# Patient Record
Sex: Female | Born: 1945 | Race: White | Hispanic: No | Smoking: Never smoker
Health system: Southern US, Community
[De-identification: ages and names within clinical notes are randomized; demographics above are authoritative.]

## PROBLEM LIST (undated history)

## (undated) ENCOUNTER — Emergency Department (HOSPITAL_COMMUNITY): Payer: Medicare Other | Source: Home / Self Care

## (undated) DIAGNOSIS — R51 Headache: Secondary | ICD-10-CM

## (undated) DIAGNOSIS — E0590 Thyrotoxicosis, unspecified without thyrotoxic crisis or storm: Secondary | ICD-10-CM

## (undated) DIAGNOSIS — K219 Gastro-esophageal reflux disease without esophagitis: Secondary | ICD-10-CM

## (undated) DIAGNOSIS — M1990 Unspecified osteoarthritis, unspecified site: Secondary | ICD-10-CM

## (undated) DIAGNOSIS — E210 Primary hyperparathyroidism: Secondary | ICD-10-CM

## (undated) DIAGNOSIS — D649 Anemia, unspecified: Secondary | ICD-10-CM

## (undated) DIAGNOSIS — E559 Vitamin D deficiency, unspecified: Secondary | ICD-10-CM

## (undated) DIAGNOSIS — I8390 Asymptomatic varicose veins of unspecified lower extremity: Secondary | ICD-10-CM

## (undated) DIAGNOSIS — E042 Nontoxic multinodular goiter: Secondary | ICD-10-CM

## (undated) DIAGNOSIS — H409 Unspecified glaucoma: Secondary | ICD-10-CM

## (undated) DIAGNOSIS — F411 Generalized anxiety disorder: Secondary | ICD-10-CM

## (undated) DIAGNOSIS — N189 Chronic kidney disease, unspecified: Secondary | ICD-10-CM

## (undated) DIAGNOSIS — E782 Mixed hyperlipidemia: Secondary | ICD-10-CM

## (undated) DIAGNOSIS — IMO0002 Reserved for concepts with insufficient information to code with codable children: Secondary | ICD-10-CM

## (undated) DIAGNOSIS — M329 Systemic lupus erythematosus, unspecified: Secondary | ICD-10-CM

## (undated) DIAGNOSIS — Z8719 Personal history of other diseases of the digestive system: Secondary | ICD-10-CM

## (undated) DIAGNOSIS — G43909 Migraine, unspecified, not intractable, without status migrainosus: Secondary | ICD-10-CM

## (undated) DIAGNOSIS — J189 Pneumonia, unspecified organism: Secondary | ICD-10-CM

## (undated) DIAGNOSIS — M8580 Other specified disorders of bone density and structure, unspecified site: Secondary | ICD-10-CM

## (undated) DIAGNOSIS — R519 Headache, unspecified: Secondary | ICD-10-CM

## (undated) HISTORY — DX: Mixed hyperlipidemia: E78.2

## (undated) HISTORY — PX: APPENDECTOMY: SHX54

## (undated) HISTORY — PX: ABDOMINAL HYSTERECTOMY: SHX81

## (undated) HISTORY — DX: Primary hyperparathyroidism: E21.0

## (undated) HISTORY — DX: Thyrotoxicosis, unspecified without thyrotoxic crisis or storm: E05.90

## (undated) HISTORY — DX: Nontoxic multinodular goiter: E04.2

## (undated) HISTORY — DX: Other specified disorders of bone density and structure, unspecified site: M85.80

## (undated) HISTORY — DX: Migraine, unspecified, not intractable, without status migrainosus: G43.909

## (undated) HISTORY — PX: SHOULDER SURGERY: SHX246

## (undated) HISTORY — PX: JOINT REPLACEMENT: SHX530

## (undated) HISTORY — DX: Generalized anxiety disorder: F41.1

## (undated) HISTORY — PX: OTHER SURGICAL HISTORY: SHX169

## (undated) HISTORY — DX: Unspecified glaucoma: H40.9

## (undated) HISTORY — DX: Asymptomatic varicose veins of unspecified lower extremity: I83.90

## (undated) HISTORY — DX: Vitamin D deficiency, unspecified: E55.9

## (undated) HISTORY — PX: EYE SURGERY: SHX253

## (undated) HISTORY — DX: Anemia, unspecified: D64.9

---

## 2003-06-27 ENCOUNTER — Ambulatory Visit (HOSPITAL_COMMUNITY): Admission: RE | Admit: 2003-06-27 | Discharge: 2003-06-27 | Payer: Self-pay | Admitting: Gastroenterology

## 2004-07-18 ENCOUNTER — Ambulatory Visit (HOSPITAL_COMMUNITY): Admission: RE | Admit: 2004-07-18 | Discharge: 2004-07-18 | Payer: Self-pay | Admitting: Gastroenterology

## 2004-07-18 ENCOUNTER — Encounter (INDEPENDENT_AMBULATORY_CARE_PROVIDER_SITE_OTHER): Payer: Self-pay | Admitting: *Deleted

## 2005-10-12 ENCOUNTER — Emergency Department (HOSPITAL_COMMUNITY): Admission: EM | Admit: 2005-10-12 | Discharge: 2005-10-12 | Payer: Self-pay | Admitting: Emergency Medicine

## 2007-04-24 ENCOUNTER — Encounter (INDEPENDENT_AMBULATORY_CARE_PROVIDER_SITE_OTHER): Payer: Self-pay | Admitting: Family Medicine

## 2007-04-24 ENCOUNTER — Ambulatory Visit: Payer: Self-pay | Admitting: Surgery

## 2007-04-24 ENCOUNTER — Ambulatory Visit (HOSPITAL_COMMUNITY): Admission: RE | Admit: 2007-04-24 | Discharge: 2007-04-24 | Payer: Self-pay | Admitting: Family Medicine

## 2008-05-12 ENCOUNTER — Encounter: Admission: RE | Admit: 2008-05-12 | Discharge: 2008-05-12 | Payer: Self-pay | Admitting: Family Medicine

## 2009-05-22 ENCOUNTER — Other Ambulatory Visit: Admission: RE | Admit: 2009-05-22 | Discharge: 2009-05-22 | Payer: Self-pay | Admitting: Family Medicine

## 2009-05-23 ENCOUNTER — Encounter: Admission: RE | Admit: 2009-05-23 | Discharge: 2009-05-23 | Payer: Self-pay | Admitting: Family Medicine

## 2010-02-03 ENCOUNTER — Emergency Department (HOSPITAL_COMMUNITY): Admission: EM | Admit: 2010-02-03 | Discharge: 2010-02-04 | Payer: Self-pay | Admitting: Emergency Medicine

## 2010-07-01 ENCOUNTER — Encounter: Payer: Self-pay | Admitting: Orthopedic Surgery

## 2010-07-01 ENCOUNTER — Encounter: Payer: Self-pay | Admitting: *Deleted

## 2010-09-24 ENCOUNTER — Other Ambulatory Visit: Payer: Self-pay | Admitting: Gastroenterology

## 2010-10-26 NOTE — Op Note (Signed)
NAME:  Lynn Jenkins, Lynn Jenkins                ACCOUNT NO.:  [id number]   MEDICAL RECORD NO.:  [id number]                   PATIENT TYPE:  AMB   LOCATION:  ENDO                                 FACILITY:  [facility]   PHYSICIAN:  Graylin Shiver, M.D.                DATE OF BIRTH:  Nov 21, 1945   DATE OF PROCEDURE:  06/27/2003  DATE OF DISCHARGE:                                 OPERATIVE REPORT   PROCEDURE:  Colonoscopy.   INDICATIONS:  Screening.  Informed consent was obtained after explanation of  the risks of bleeding, infection, and perforation.   PREMEDICATION:  Fentanyl 75 mcg IV, Versed 7 mg IV.   DESCRIPTION OF PROCEDURE:  With the patient in the left lateral decubitus  position, a rectal exam was performed.  No masses were felt.  The Olympus  colonoscope was inserted into the rectum and advanced around the colon to  the cecum.  Cecal landmarks were identified.  The cecum and ascending colon  were normal.  The transverse colon normal, the descending colon, sigmoid,  and rectum were normal.  She tolerated the procedure well without  complications.   IMPRESSION:  Normal colonoscopy to the cecum.                                               Graylin Shiver, M.D.    SFG/MEDQ  D:  06/27/2003  T:  06/28/2003  Job:  [id number]   cc:   Al Decant. Janey Greaser, MD  [address]  [place]  [place] [address]  Fax: [phone]

## 2010-10-26 NOTE — Op Note (Signed)
NAME:  Lynn Jenkins, Lynn Jenkins    ACCOUNT NO.:  [id number]   MEDICAL RECORD NO.:  [id number]          PATIENT TYPE:  AMB   LOCATION:  ENDO                         FACILITY:  [facility]   PHYSICIAN:  Graylin Shiver, M.D.   DATE OF BIRTH:  06/29/1945   DATE OF PROCEDURE:  07/18/2004  DATE OF DISCHARGE:                                 OPERATIVE REPORT   PROCEDURE PERFORMED:  Esophagogastroduodenoscopy with biopsy.   INDICATIONS FOR PROCEDURE:  Chronic heartburn.   Informed consent was obtained after explanation of the risks of bleeding,  infection and perforation.   PREMEDICATION:  Fentanyl 40 mcg IV, Versed 6 milligrams IV.   PROCEDURE:  With the patient in the left lateral decubitus position, the  Olympus gastroscope was inserted into the oropharynx and passed into the  esophagus. It was advanced down the esophagus and into the stomach and into  the duodenum.  The second portion and bulb of the duodenum looked normal.  The stomach showed erythema compatible with gastritis in the antrum and  lower body.  Biopsies were obtained for histological inspection.  The body  of the stomach and the upper fundus and cardia had normal-appearing mucosa.  No lesions were seen on retroflexion.  There was a moderate-sized hiatal  hernia. The esophageal mucosa looked normal. The esophagogastric junction  was at 30 cm.  She tolerated the procedure well without complications.   IMPRESSION:  1.  Moderate sized hiatal hernia.  2.  Gastritis.   PLAN:  Continue Nexium.  It may be necessary to use a b.i.d. dose of Nexium  to control her heartburn.  The biopsies will be checked.      SFG/MEDQ  D:  07/18/2004  T:  07/18/2004  Job:  [id number]   cc:   Al Decant. Janey Greaser, MD  [address]  [place]  [place] [address]  Fax: [phone]

## 2011-02-26 ENCOUNTER — Other Ambulatory Visit: Payer: Self-pay | Admitting: Orthopedic Surgery

## 2011-02-26 DIAGNOSIS — N289 Disorder of kidney and ureter, unspecified: Secondary | ICD-10-CM

## 2011-02-27 ENCOUNTER — Ambulatory Visit
Admission: RE | Admit: 2011-02-27 | Discharge: 2011-02-27 | Disposition: A | Discharge status: Home / Self Care | Payer: Managed Care, Other (non HMO) | Source: Ambulatory Visit | Attending: Orthopedic Surgery | Admitting: Orthopedic Surgery

## 2011-02-27 DIAGNOSIS — N289 Disorder of kidney and ureter, unspecified: Secondary | ICD-10-CM

## 2011-09-05 ENCOUNTER — Other Ambulatory Visit: Payer: Self-pay

## 2011-09-05 DIAGNOSIS — I83893 Varicose veins of bilateral lower extremities with other complications: Secondary | ICD-10-CM

## 2011-11-11 ENCOUNTER — Encounter: Payer: Self-pay | Admitting: Vascular Surgery

## 2011-11-12 ENCOUNTER — Encounter (INDEPENDENT_AMBULATORY_CARE_PROVIDER_SITE_OTHER): Payer: Managed Care, Other (non HMO) | Admitting: *Deleted

## 2011-11-12 ENCOUNTER — Ambulatory Visit (INDEPENDENT_AMBULATORY_CARE_PROVIDER_SITE_OTHER): Payer: Managed Care, Other (non HMO) | Admitting: Vascular Surgery

## 2011-11-12 ENCOUNTER — Encounter: Payer: Self-pay | Admitting: Vascular Surgery

## 2011-11-12 VITALS — BP 140/92 | HR 83 | Resp 16 | Ht 66.0 in | Wt 212.0 lb

## 2011-11-12 DIAGNOSIS — I83893 Varicose veins of bilateral lower extremities with other complications: Secondary | ICD-10-CM

## 2011-11-12 NOTE — Progress Notes (Signed)
Subjective:     Patient ID: Lynn Jenkins, female   DOB: 1946-02-14, 66 y.o.   MRN: [id number]  HPI this 66 year old female was referred for pain in both lower extremities right worse than left due to venous insufficiency. She has noted some bulges on the lateral aspect of her right thigh and she complains of significant aching throbbing and burning discomfort extending up into the anterior thigh on that side. She has mild distal edema as the day progresses. She has no history of DVT, thrombophlebitis, stasis ulcers, or bleeding. She does not wear elastic compression stockings. Elevation of her legs does not relieve her symptoms. She does not take pain medication. She states the symptoms can be quite severe.  Past Medical History  Diagnosis Date  . Varicose veins   . Anemia     History  Substance Use Topics  . Smoking status: Never Smoker   . Smokeless tobacco: Not on file  . Alcohol Use: No    Family History  Problem Relation Age of Onset  . Heart disease Mother   . Hyperlipidemia Mother   . Hypertension Mother   . Heart disease Father   . Hyperlipidemia Father   . Hypertension Father   . Cancer Brother   . Hypertension Brother     Allergies  Allergen Reactions  . Morphine And Related     Current outpatient prescriptions:esomeprazole (NEXIUM) 40 MG capsule, Take 40 mg by mouth daily before breakfast., Disp: , Rfl: ;  fenofibrate (TRIGLIDE) 50 MG tablet, Take 50 mg by mouth daily., Disp: , Rfl: ;  indomethacin (INDOCIN) 25 MG capsule, Take 25 mg by mouth 2 (two) times daily with a meal., Disp: , Rfl: ;  solifenacin (VESICARE) 10 MG tablet, Take 5 mg by mouth daily., Disp: , Rfl:  SUMAtriptan (IMITREX) 100 MG tablet, Take 100 mg by mouth every 2 (two) hours as needed., Disp: , Rfl:   BP 140/92  Pulse 83  Resp 16  Ht 5\' 6"  (1.676 m)  Wt 212 lb (96.163 kg)  BMI 34.22 kg/m2  Body mass index is 34.22 kg/(m^2).           Review of Systems denies chest pain, dyspnea on  exertion, PND, orthopnea, hemoptysis, chronic bronchitis, lateralizing neurologic symptoms.    Objective:   Physical Exam blood pressure 140/92 heart rate 83 respirations 16 Gen.-alert and oriented x3 in no apparent distress HEENT normal for age Lungs no rhonchi or wheezing Cardiovascular regular rhythm no murmurs carotid pulses 3+ palpable no bruits audible Abdomen soft nontender no palpable masses-obese  'Musculoskeletal free of  major deformities Skin clear -no rashes Neurologic normal Lower extremities 3+ femoral and dorsalis pedis pulses palpable bilaterally with no edema-she has prominent varicosities in the lateral aspect of the proximal calf and lateral thigh where the discomfort is located. She has no significant varicosities in the left leg. She does have some spider veins bilaterally in the medial thigh areas and lateral thigh areas.  Today I ordered bilateral venous duplex exam which I reviewed and interpreted. She does have some mild reflux in both great saphenous and small saphenous systems but the caliber of the veins are small bilaterally. There is no DVT. There is very mild reflux in the deep system.     Assessment:     Mild reflux bilateral great and small saphenous vein with painful varicosities lateral calf and thigh on the right    Plan:     Have offered sclerotherapy as treatment for this  prominent varix on the lateral aspect of the right thigh and calf. It is not certain that this will relieve her symptomatology since she also has lumbar disc disease. She will consider that as an option

## 2011-11-18 NOTE — Procedures (Unsigned)
LOWER EXTREMITY VENOUS REFLUX EXAM  INDICATION:  Varicose veins with discomfort  EXAM:  Using color-flow imaging and pulse Doppler spectral analysis, the right and left common femoral, femoral, popliteal, posterior tibial, great and small saphenous veins were evaluated.  There is evidence suggesting deep venous insufficiency in the right and left lower extremity.  The right and left saphenofemoral junction is not competent with reflux of >500 milliseconds.  The right and left great saphenous vein is not competent with reflux of >500 milliseconds with the caliber as described below.   The right and left proximal small saphenous vein demonstrates incompetency.  GSV Diameter (used if found to be incompetent only)                                           Right    Left Proximal Greater Saphenous Vein           0.96 cm  0.92 cm Proximal-to-mid-thigh                     0.49 cm  0.52 cm Mid thigh                                 0.39 cm  0.53 cm Mid-distal thigh                          cm       cm Distal thigh                              0.27 cm  0.48 cm Knee                                      0.38 cm  0.33 cm   IMPRESSION: 1. The right and left great saphenous vein is not competent with     reflux of >500 milliseconds. 2. The right and left great saphenous vein is not tortuous. 3. The deep venous system is not competent with reflux of >500     milliseconds. 4. The right and left small saphenous vein is not competent with     reflux of >500 milliseconds.       ___________________________________________ Quita Skye Hart [place], M.D.  EM/MEDQ  D:  11/12/2011  T:  11/12/2011  Job:  [id number]

## 2013-10-07 ENCOUNTER — Other Ambulatory Visit: Payer: Self-pay | Admitting: Family Medicine

## 2013-10-07 DIAGNOSIS — Z1231 Encounter for screening mammogram for malignant neoplasm of breast: Secondary | ICD-10-CM

## 2013-10-18 ENCOUNTER — Ambulatory Visit: Payer: Managed Care, Other (non HMO)

## 2013-10-26 ENCOUNTER — Other Ambulatory Visit: Payer: Self-pay | Admitting: Gastroenterology

## 2013-10-26 DIAGNOSIS — K219 Gastro-esophageal reflux disease without esophagitis: Secondary | ICD-10-CM

## 2013-11-04 ENCOUNTER — Ambulatory Visit
Admission: RE | Admit: 2013-11-04 | Discharge: 2013-11-04 | Disposition: A | Discharge status: Home / Self Care | Payer: BC Managed Care – PPO | Source: Ambulatory Visit | Attending: Gastroenterology | Admitting: Gastroenterology

## 2013-11-04 DIAGNOSIS — K219 Gastro-esophageal reflux disease without esophagitis: Secondary | ICD-10-CM

## 2013-12-17 ENCOUNTER — Ambulatory Visit (INDEPENDENT_AMBULATORY_CARE_PROVIDER_SITE_OTHER): Payer: BC Managed Care – PPO | Admitting: Surgery

## 2014-01-05 ENCOUNTER — Other Ambulatory Visit (INDEPENDENT_AMBULATORY_CARE_PROVIDER_SITE_OTHER): Payer: Self-pay

## 2014-01-05 ENCOUNTER — Encounter (INDEPENDENT_AMBULATORY_CARE_PROVIDER_SITE_OTHER): Payer: Self-pay | Admitting: Surgery

## 2014-01-05 ENCOUNTER — Ambulatory Visit (INDEPENDENT_AMBULATORY_CARE_PROVIDER_SITE_OTHER): Payer: BC Managed Care – PPO | Admitting: Surgery

## 2014-01-05 VITALS — BP 126/76 | HR 77 | Temp 98.0°F | Ht 65.0 in | Wt 225.0 lb

## 2014-01-14 ENCOUNTER — Other Ambulatory Visit (HOSPITAL_COMMUNITY): Payer: Self-pay | Admitting: Gastroenterology

## 2014-01-14 DIAGNOSIS — R110 Nausea: Secondary | ICD-10-CM

## 2014-01-21 ENCOUNTER — Ambulatory Visit (HOSPITAL_COMMUNITY): Payer: BC Managed Care – PPO

## 2014-01-27 ENCOUNTER — Encounter (HOSPITAL_COMMUNITY)
Admission: RE | Admit: 2014-01-27 | Discharge: 2014-01-27 | Disposition: A | Discharge status: Home / Self Care | Payer: BC Managed Care – PPO | Source: Ambulatory Visit | Attending: Gastroenterology | Admitting: Gastroenterology

## 2014-01-27 ENCOUNTER — Ambulatory Visit (INDEPENDENT_AMBULATORY_CARE_PROVIDER_SITE_OTHER): Payer: BC Managed Care – PPO | Admitting: Surgery

## 2014-01-27 DIAGNOSIS — R110 Nausea: Secondary | ICD-10-CM | POA: Diagnosis not present

## 2014-01-27 MED ORDER — TECHNETIUM TC 99M SULFUR COLLOID
2.0000 | Freq: Once | INTRAVENOUS | Status: AC | PRN
  Administered 2014-01-27: 2 via ORAL

## 2014-02-18 ENCOUNTER — Other Ambulatory Visit: Payer: Self-pay | Admitting: Gastroenterology

## 2014-02-18 DIAGNOSIS — R1013 Epigastric pain: Secondary | ICD-10-CM

## 2014-02-25 ENCOUNTER — Other Ambulatory Visit: Payer: BC Managed Care – PPO

## 2014-02-28 ENCOUNTER — Ambulatory Visit (HOSPITAL_BASED_OUTPATIENT_CLINIC_OR_DEPARTMENT_OTHER)
Admission: RE | Admit: 2014-02-28 | Discharge: 2014-02-28 | Disposition: A | Discharge status: Home / Self Care | Payer: BC Managed Care – PPO | Source: Ambulatory Visit | Attending: Family Medicine | Admitting: Family Medicine

## 2014-02-28 ENCOUNTER — Ambulatory Visit (HOSPITAL_BASED_OUTPATIENT_CLINIC_OR_DEPARTMENT_OTHER)
Admission: RE | Admit: 2014-02-28 | Discharge: 2014-02-28 | Disposition: A | Discharge status: Home / Self Care | Payer: BC Managed Care – PPO | Source: Ambulatory Visit | Attending: Gastroenterology | Admitting: Gastroenterology

## 2014-02-28 DIAGNOSIS — R1011 Right upper quadrant pain: Secondary | ICD-10-CM | POA: Diagnosis not present

## 2014-02-28 DIAGNOSIS — R1013 Epigastric pain: Secondary | ICD-10-CM

## 2014-02-28 DIAGNOSIS — K769 Liver disease, unspecified: Secondary | ICD-10-CM | POA: Insufficient documentation

## 2014-02-28 DIAGNOSIS — Z1231 Encounter for screening mammogram for malignant neoplasm of breast: Secondary | ICD-10-CM | POA: Diagnosis present

## 2014-07-12 ENCOUNTER — Other Ambulatory Visit (HOSPITAL_COMMUNITY): Payer: Self-pay | Admitting: Endocrinology

## 2014-07-25 ENCOUNTER — Encounter (HOSPITAL_COMMUNITY)
Admission: RE | Admit: 2014-07-25 | Discharge: 2014-07-25 | Disposition: A | Discharge status: Home / Self Care | Payer: BLUE CROSS/BLUE SHIELD | Source: Ambulatory Visit | Attending: Endocrinology | Admitting: Endocrinology

## 2014-07-25 ENCOUNTER — Encounter (HOSPITAL_COMMUNITY): Admission: RE | Admit: 2014-07-25 | Payer: BLUE CROSS/BLUE SHIELD | Source: Ambulatory Visit

## 2014-07-25 MED ORDER — TECHNETIUM TC 99M SESTAMIBI - CARDIOLITE
24.0000 | Freq: Once | INTRAVENOUS | Status: AC | PRN
  Administered 2014-07-25: 24 via INTRAVENOUS

## 2014-08-17 ENCOUNTER — Other Ambulatory Visit (INDEPENDENT_AMBULATORY_CARE_PROVIDER_SITE_OTHER): Payer: Self-pay

## 2014-08-17 DIAGNOSIS — E214 Other specified disorders of parathyroid gland: Secondary | ICD-10-CM

## 2014-08-17 DIAGNOSIS — E210 Primary hyperparathyroidism: Secondary | ICD-10-CM

## 2014-08-19 ENCOUNTER — Encounter (INDEPENDENT_AMBULATORY_CARE_PROVIDER_SITE_OTHER): Payer: Self-pay

## 2014-08-19 ENCOUNTER — Ambulatory Visit
Admission: RE | Admit: 2014-08-19 | Discharge: 2014-08-19 | Disposition: A | Discharge status: Home / Self Care | Payer: BLUE CROSS/BLUE SHIELD | Source: Ambulatory Visit | Attending: Surgery | Admitting: Surgery

## 2014-08-30 ENCOUNTER — Other Ambulatory Visit: Payer: Self-pay | Admitting: Surgery

## 2014-08-30 DIAGNOSIS — E041 Nontoxic single thyroid nodule: Secondary | ICD-10-CM

## 2014-08-31 ENCOUNTER — Other Ambulatory Visit: Payer: Self-pay | Admitting: Surgery

## 2014-08-31 DIAGNOSIS — E041 Nontoxic single thyroid nodule: Secondary | ICD-10-CM

## 2014-09-14 ENCOUNTER — Other Ambulatory Visit (HOSPITAL_COMMUNITY)
Admission: RE | Admit: 2014-09-14 | Discharge: 2014-09-14 | Disposition: A | Discharge status: Home / Self Care | Payer: BLUE CROSS/BLUE SHIELD | Source: Ambulatory Visit | Attending: Interventional Radiology | Admitting: Interventional Radiology

## 2014-09-14 ENCOUNTER — Ambulatory Visit
Admission: RE | Admit: 2014-09-14 | Discharge: 2014-09-14 | Disposition: A | Discharge status: Home / Self Care | Payer: BLUE CROSS/BLUE SHIELD | Source: Ambulatory Visit | Attending: Surgery | Admitting: Surgery

## 2014-09-14 DIAGNOSIS — E041 Nontoxic single thyroid nodule: Secondary | ICD-10-CM | POA: Diagnosis not present

## 2014-10-19 ENCOUNTER — Ambulatory Visit: Payer: Self-pay | Admitting: Surgery

## 2014-10-27 NOTE — Patient Instructions (Signed)
Lynn Jenkins  10/27/2014   Your procedure is scheduled on: 11-01-14  Report to [facility]  Entrance and follow signs to               [place] at 7:30 AM.  Call this number if you have problems the morning of surgery [phone]   Remember: ONLY 1 PERSON MAY GO WITH YOU TO SHORT STAY TO GET  READY MORNING OF [place].  Do not eat food or drink liquids :After Midnight.     Take these medicines the morning of surgery with A SIP OF WATER: Nexium                               You may not have any metal on your body including hair pins and              piercings  Do not wear jewelry, make-up, lotions, powders or perfumes, deodorant             Do not wear nail polish.  Do not shave  48 hours prior to surgery.         .   Do not bring valuables to the hospital. [place].  Contacts, dentures or bridgework may not be worn into surgery.  Leave suitcase in the car. After surgery it may be brought to your room.    Marland Kitchen    Special Instructions: coughing and deep exercises, leg exercises              Please read over the following fact sheets you were given: _____________________________________________________________________             [facility] - Preparing for Surgery Before surgery, you can play an important role.  Because skin is not sterile, your skin needs to be as free of germs as possible.  You can reduce the number of germs on your skin by washing with CHG (chlorahexidine gluconate) soap before surgery.  CHG is an antiseptic cleaner which kills germs and bonds with the skin to continue killing germs even after washing. Please DO NOT use if you have an allergy to CHG or antibacterial soaps.  If your skin becomes reddened/irritated stop using the CHG and inform your nurse when you arrive at Short Stay. Do not shave (including legs and underarms) for at least 48 hours prior to the first CHG  shower.  You may shave your face/neck. Please follow these instructions carefully:  1.  Shower with CHG Soap the night before surgery and the  morning of Surgery.  2.  If you choose to wash your hair, wash your hair first as usual with your  normal  shampoo.  3.  After you shampoo, rinse your hair and body thoroughly to remove the  shampoo.                           4.  Use CHG as you would any other liquid soap.  You can apply chg directly  to the skin and wash                       Gently with a scrungie or clean washcloth.  5.  Apply the CHG Soap to your body ONLY FROM THE NECK DOWN.   Do not use on face/ open                           Wound or open sores. Avoid contact with eyes, ears mouth and genitals (private parts).                       Wash face,  Genitals (private parts) with your normal soap.             6.  Wash thoroughly, paying special attention to the area where your surgery  will be performed.  7.  Thoroughly rinse your body with warm water from the neck down.  8.  DO NOT shower/wash with your normal soap after using and rinsing off  the CHG Soap.                9.  Pat yourself dry with a clean towel.            10.  Wear clean pajamas.            11.  Place clean sheets on your bed the night of your first shower and do not  sleep with pets. Day of Surgery : Do not apply any lotions/deodorants the morning of surgery.  Please wear clean clothes to the hospital/surgery center.  FAILURE TO FOLLOW THESE INSTRUCTIONS MAY RESULT IN THE CANCELLATION OF YOUR SURGERY PATIENT SIGNATURE_________________________________  NURSE SIGNATURE__________________________________  ________________________________________________________________________

## 2014-10-28 ENCOUNTER — Encounter (HOSPITAL_COMMUNITY)
Admission: RE | Admit: 2014-10-28 | Discharge: 2014-10-28 | Disposition: A | Discharge status: Home / Self Care | Payer: BLUE CROSS/BLUE SHIELD | Source: Ambulatory Visit | Attending: Surgery | Admitting: Surgery

## 2014-10-28 ENCOUNTER — Encounter (HOSPITAL_COMMUNITY): Payer: Self-pay

## 2014-10-28 ENCOUNTER — Ambulatory Visit (HOSPITAL_COMMUNITY)
Admission: RE | Admit: 2014-10-28 | Discharge: 2014-10-28 | Disposition: A | Discharge status: Home / Self Care | Payer: BLUE CROSS/BLUE SHIELD | Source: Ambulatory Visit | Attending: Anesthesiology | Admitting: Anesthesiology

## 2014-10-28 DIAGNOSIS — Z01812 Encounter for preprocedural laboratory examination: Secondary | ICD-10-CM | POA: Diagnosis not present

## 2014-10-28 DIAGNOSIS — Z01818 Encounter for other preprocedural examination: Secondary | ICD-10-CM | POA: Insufficient documentation

## 2014-10-28 DIAGNOSIS — E210 Primary hyperparathyroidism: Secondary | ICD-10-CM | POA: Diagnosis not present

## 2014-10-28 HISTORY — DX: Headache, unspecified: R51.9

## 2014-10-28 HISTORY — DX: Headache: R51

## 2014-10-28 HISTORY — DX: Gastro-esophageal reflux disease without esophagitis: K21.9

## 2014-10-28 HISTORY — DX: Personal history of other diseases of the digestive system: Z87.19

## 2014-10-28 HISTORY — DX: Unspecified osteoarthritis, unspecified site: M19.90

## 2014-10-28 LAB — CBC
HCT: 40.2 % (ref 36.0–46.0)
Hemoglobin: 12.5 g/dL (ref 12.0–15.0)
MCH: 25.9 pg — ABNORMAL LOW (ref 26.0–34.0)
MCHC: 31.1 g/dL (ref 30.0–36.0)
MCV: 83.2 fL (ref 78.0–100.0)
Platelets: 245 10*3/uL (ref 150–400)
RBC: 4.83 MIL/uL (ref 3.87–5.11)
RDW: 17 % — ABNORMAL HIGH (ref 11.5–15.5)
WBC: 5.7 10*3/uL (ref 4.0–10.5)

## 2014-10-31 ENCOUNTER — Encounter (HOSPITAL_COMMUNITY): Payer: Self-pay | Admitting: Surgery

## 2014-10-31 DIAGNOSIS — E210 Primary hyperparathyroidism: Secondary | ICD-10-CM | POA: Diagnosis present

## 2014-10-31 NOTE — Anesthesia Preprocedure Evaluation (Addendum)
Anesthesia Evaluation  Patient identified by MRN, date of birth, ID band Patient awake    Reviewed: Allergy & Precautions, NPO status , Patient's Chart, lab work & pertinent test results, reviewed documented beta blocker date and time   Airway Mallampati: II   Neck ROM: Full    Dental  (+) Dental Advisory Given, Teeth Intact   Pulmonary neg pulmonary ROS,  breath sounds clear to auscultation        Cardiovascular + Peripheral Vascular Disease negative cardio ROS  Rhythm:Regular     Neuro/Psych  Headaches,    GI/Hepatic Neg liver ROS, hiatal hernia, GERD-  Medicated,  Endo/Other  12/40  Renal/GU negative Renal ROS     Musculoskeletal   Abdominal (+)  Abdomen: soft.    Peds  Hematology 12/40   Anesthesia Other Findings   Reproductive/Obstetrics                            Anesthesia Physical Anesthesia Plan  ASA: II  Anesthesia Plan: General   Post-op Pain Management:    Induction: Intravenous  Airway Management Planned: Oral ETT  Additional Equipment:   Intra-op Plan:   Post-operative Plan: Extubation in OR  Informed Consent: I have reviewed the patients History and Physical, chart, labs and discussed the procedure including the risks, benefits and alternatives for the proposed anesthesia with the patient or authorized representative who has indicated his/her understanding and acceptance.     Plan Discussed with:   Anesthesia Plan Comments:         Anesthesia Quick Evaluation

## 2014-10-31 NOTE — H&P (Signed)
General Surgery [facility]  Lynn Jenkins DOB: [birth year] Married / Language: English / Race: White Female  History of Present Illness  The patient is a 69 year old female who presents with a parathyroid neoplasm. Patient is referred by Dr. Jacelyn Pi for evaluation of suspected primary hyperparathyroidism. Patient's primary care physician is Dr. Bronson Ing at [facility]. Patient was first noted to have hypercalcemia in 2012. Most recent calcium level is elevated at 11.5. Additional laboratory studies included an intact PTH level which is inappropriately in the upper range of normal at 53. Vitamin D level was slightly low at 26.5. Patient underwent nuclear medicine scan which failed to localize a parathyroid adenoma. Patient is now referred for surgical consultation for further evaluation of suspected primary hyperparathyroidism. Patient has no recent history of fractures. She denies nephrolithiasis. She does note chronic significant fatigue. She has been treated for gastroesophageal reflux and gastroparesis by her gastroenterologist, Dr. Acquanetta Sit.   Other Problems Anxiety Disorder Arthritis Back Pain Bladder Problems Depression Gastroesophageal Reflux Disease Migraine Headache  Past Surgical History  Appendectomy Foot Surgery Bilateral. Hysterectomy (not due to cancer) - Partial Shoulder Surgery Right.  Diagnostic Studies History Colonoscopy 1-5 years ago Mammogram 1-3 years ago Pap Smear >5 years ago  Allergies Morphine Sulfate (PF) *ANALGESICS - OPIOID* Swelling.  Medication History Imitrex (100MG  Tablet, Oral) Active. NexIUM (40MG  Capsule DR, Oral) Active. Fenofibrate Micronized (54MG  Tablet, Oral) Active. Indocin (25MG /5ML Suspension, Oral) Active. Vitamin D3 (5000UNIT Capsule, Oral) Active. Medications Reconciled  Social History Alcohol use Occasional alcohol use. Caffeine use  Carbonated beverages, Coffee, Tea. No drug use Tobacco use Never smoker.  Family History Alcohol Abuse Brother, Sister. Arthritis Brother, Mother. Cancer Brother. Depression Sister. Diabetes Mellitus Son. Heart Disease Father, Mother. Hypertension Father, Mother. Migraine Headache Mother.  Pregnancy / Birth History Age at menarche 6 years. Gravida 1 Maternal age 72-20 Para 1  Review of Systems General Present- Appetite Loss, Fatigue, Weight Gain and Weight Loss. Not Present- Chills, Fever and Night Sweats. Skin Present- Dryness. Not Present- Change in Wart/Mole, Hives, Jaundice, New Lesions, Non-Healing Wounds, Rash and Ulcer. HEENT Present- Hoarseness, Ringing in the Ears, Seasonal Allergies, Sinus Pain, Sore Throat and Wears glasses/contact lenses. Not Present- Earache, Hearing Loss, Nose Bleed, Oral Ulcers, Visual Disturbances and Yellow Eyes. Respiratory Not Present- Bloody sputum, Chronic Cough, Difficulty Breathing, Snoring and Wheezing. Breast Not Present- Breast Mass, Breast Pain, Nipple Discharge and Skin Changes. Cardiovascular Present- Leg Cramps and Swelling of Extremities. Not Present- Chest Pain, Difficulty Breathing Lying Down, Palpitations, Rapid Heart Rate and Shortness of Breath. Gastrointestinal Present- Bloating, Excessive gas, Gets full quickly at meals, Indigestion and Nausea. Not Present- Abdominal Pain, Bloody Stool, Change in Bowel Habits, Chronic diarrhea, Constipation, Difficulty Swallowing, Hemorrhoids, Rectal Pain and Vomiting. Female Genitourinary Present- Nocturia and Urgency. Not Present- Frequency, Painful Urination and Pelvic Pain. Musculoskeletal Present- Back Pain, Joint Pain and Muscle Weakness. Not Present- Joint Stiffness, Muscle Pain and Swelling of Extremities. Neurological Present- Headaches. Not Present- Decreased Memory, Fainting, Numbness, Seizures, Tingling, Tremor, Trouble walking and Weakness. Psychiatric Present-  Depression and Frequent crying. Not Present- Anxiety, Bipolar, Change in Sleep Pattern and Fearful. Endocrine Present- Hair Changes. Not Present- Cold Intolerance, Excessive Hunger, Heat Intolerance, Hot flashes and New Diabetes. Hematology Not Present- Easy Bruising, Excessive bleeding, Gland problems, HIV and Persistent Infections.   Vitals Weight: 211.13 lb Height: 63in Body Surface Area: 2.06 m Body Mass Index: 37.4 kg/m Pulse: 65 (Regular)  BP: 126/84 (  Sitting, Left Arm, Standard)    Physical Exam  General - appears comfortable, no distress; not diaphorectic  HEENT - normocephalic; sclerae clear, gaze conjugate; mucous membranes moist, dentition good; voice normal  Neck - symmetric on extension; no palpable anterior or posterior cervical adenopathy; no palpable masses in the thyroid bed  Chest - clear bilaterally with rhonchi, rales, or wheeze  Cor - regular rhythm with normal rate; no significant murmur  Ext - non-tender without significant edema or lymphedema  Neuro - grossly intact; no tremor    Assessment & Plan 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 Laboratory studies show significant elevated serum calcium level with an inappropriately high normal intact PTH level. Nuclear medicine parathyroid scan however failed to reveal evidence of a parathyroid adenoma.  Ultrasound failed to identify an adenoma.  We will obtain the results of the patient's 24-hour urine collection for calcium and her bone density scan results for review.  Plan neck exploration and parathyroidectomy.  The risks and benefits of the procedure have been discussed at length with the patient.  The patient understands the proposed procedure, potential alternative treatments, and the course of recovery to be expected.  All of the patient's questions have been answered at this time.  The patient wishes to  proceed with surgery.  Earnstine Regal, MD, [facility] Office: [phone]

## 2014-11-01 ENCOUNTER — Encounter (HOSPITAL_COMMUNITY)
Admission: RE | Disposition: A | Discharge status: Home / Self Care | Payer: Self-pay | Source: Ambulatory Visit | Attending: Surgery

## 2014-11-01 ENCOUNTER — Encounter (HOSPITAL_COMMUNITY): Payer: Self-pay | Admitting: *Deleted

## 2014-11-01 ENCOUNTER — Ambulatory Visit (HOSPITAL_COMMUNITY): Payer: BLUE CROSS/BLUE SHIELD | Admitting: Anesthesiology

## 2014-11-01 ENCOUNTER — Observation Stay (HOSPITAL_COMMUNITY)
Admission: RE | Admit: 2014-11-01 | Discharge: 2014-11-02 | Disposition: A | Discharge status: Home / Self Care | Payer: BLUE CROSS/BLUE SHIELD | Source: Ambulatory Visit | Attending: Surgery | Admitting: Surgery

## 2014-11-01 DIAGNOSIS — M1990 Unspecified osteoarthritis, unspecified site: Secondary | ICD-10-CM | POA: Insufficient documentation

## 2014-11-01 DIAGNOSIS — F419 Anxiety disorder, unspecified: Secondary | ICD-10-CM | POA: Diagnosis not present

## 2014-11-01 DIAGNOSIS — G43909 Migraine, unspecified, not intractable, without status migrainosus: Secondary | ICD-10-CM | POA: Insufficient documentation

## 2014-11-01 DIAGNOSIS — Z90710 Acquired absence of both cervix and uterus: Secondary | ICD-10-CM | POA: Insufficient documentation

## 2014-11-01 DIAGNOSIS — Z886 Allergy status to analgesic agent status: Secondary | ICD-10-CM | POA: Diagnosis not present

## 2014-11-01 DIAGNOSIS — F329 Major depressive disorder, single episode, unspecified: Secondary | ICD-10-CM | POA: Insufficient documentation

## 2014-11-01 DIAGNOSIS — E210 Primary hyperparathyroidism: Principal | ICD-10-CM | POA: Diagnosis present

## 2014-11-01 DIAGNOSIS — K219 Gastro-esophageal reflux disease without esophagitis: Secondary | ICD-10-CM | POA: Diagnosis not present

## 2014-11-01 DIAGNOSIS — E041 Nontoxic single thyroid nodule: Secondary | ICD-10-CM | POA: Diagnosis not present

## 2014-11-01 HISTORY — PX: PARATHYROIDECTOMY: SHX19

## 2014-11-01 SURGERY — PARATHYROIDECTOMY
Anesthesia: General

## 2014-11-01 MED ORDER — PROPOFOL 10 MG/ML IV BOLUS
INTRAVENOUS | Status: DC | PRN
  Administered 2014-11-01: 200 mg via INTRAVENOUS

## 2014-11-01 MED ORDER — ONDANSETRON HCL 4 MG PO TABS: 4.0000 mg | ORAL_TABLET | Freq: Four times a day (QID) | ORAL | Status: DC | PRN

## 2014-11-01 MED ORDER — HYDROMORPHONE HCL 1 MG/ML IJ SOLN
1.0000 mg | INTRAMUSCULAR | Status: DC | PRN
Start: 2014-11-01 — End: 2014-11-02

## 2014-11-01 MED ORDER — ONDANSETRON HCL 4 MG/2ML IJ SOLN
INTRAMUSCULAR | Status: DC | PRN
  Administered 2014-11-01: 4 mg via INTRAVENOUS

## 2014-11-01 MED ORDER — DEXAMETHASONE SODIUM PHOSPHATE 10 MG/ML IJ SOLN
INTRAMUSCULAR | Status: AC
  Filled 2014-11-01: qty 1

## 2014-11-01 MED ORDER — LABETALOL HCL 5 MG/ML IV SOLN
INTRAVENOUS | Status: DC | PRN
  Administered 2014-11-01: 5 mg via INTRAVENOUS

## 2014-11-01 MED ORDER — PROPOFOL 10 MG/ML IV BOLUS
INTRAVENOUS | Status: AC
  Filled 2014-11-01: qty 20

## 2014-11-01 MED ORDER — CEFAZOLIN SODIUM-DEXTROSE 2-3 GM-% IV SOLR
2.0000 g | INTRAVENOUS | Status: AC
  Administered 2014-11-01: 2 g via INTRAVENOUS

## 2014-11-01 MED ORDER — METOCLOPRAMIDE HCL 5 MG/ML IJ SOLN
INTRAMUSCULAR | Status: DC | PRN
  Administered 2014-11-01: 10 mg via INTRAVENOUS

## 2014-11-01 MED ORDER — ONDANSETRON HCL 4 MG/2ML IJ SOLN: 4.0000 mg | Freq: Four times a day (QID) | INTRAMUSCULAR | Status: DC | PRN

## 2014-11-01 MED ORDER — NEOSTIGMINE METHYLSULFATE 10 MG/10ML IV SOLN
INTRAVENOUS | Status: DC | PRN
  Administered 2014-11-01: 3 mg via INTRAVENOUS

## 2014-11-01 MED ORDER — SUFENTANIL CITRATE 50 MCG/ML IV SOLN
INTRAVENOUS | Status: AC
  Filled 2014-11-01: qty 1

## 2014-11-01 MED ORDER — LIDOCAINE HCL (CARDIAC) 20 MG/ML IV SOLN
INTRAVENOUS | Status: DC | PRN
  Administered 2014-11-01: 30 mg via INTRATRACHEAL
  Administered 2014-11-01: 100 mg via INTRAVENOUS

## 2014-11-01 MED ORDER — LIDOCAINE HCL (CARDIAC) 20 MG/ML IV SOLN
INTRAVENOUS | Status: AC
  Filled 2014-11-01: qty 5

## 2014-11-01 MED ORDER — FENTANYL CITRATE (PF) 100 MCG/2ML IJ SOLN
25.0000 ug | INTRAMUSCULAR | Status: DC | PRN
  Administered 2014-11-01 (×2): 25 ug via INTRAVENOUS

## 2014-11-01 MED ORDER — LACTATED RINGERS IV SOLN
INTRAVENOUS | Status: DC
  Administered 2014-11-01: 1000 mL via INTRAVENOUS

## 2014-11-01 MED ORDER — GLYCOPYRROLATE 0.2 MG/ML IJ SOLN
INTRAMUSCULAR | Status: DC | PRN
  Administered 2014-11-01: 0.4 mg via INTRAVENOUS

## 2014-11-01 MED ORDER — MEPERIDINE HCL 50 MG/ML IJ SOLN: 6.2500 mg | INTRAMUSCULAR | Status: DC | PRN

## 2014-11-01 MED ORDER — BUPIVACAINE HCL (PF) 0.25 % IJ SOLN
INTRAMUSCULAR | Status: AC
  Filled 2014-11-01: qty 30

## 2014-11-01 MED ORDER — HYDROCODONE-ACETAMINOPHEN 5-325 MG PO TABS
1.0000 | ORAL_TABLET | ORAL | Status: DC | PRN
  Filled 2014-11-01: qty 1

## 2014-11-01 MED ORDER — KCL IN DEXTROSE-NACL 20-5-0.45 MEQ/L-%-% IV SOLN
INTRAVENOUS | Status: DC
  Administered 2014-11-01: 18:00:00 via INTRAVENOUS
  Filled 2014-11-01 (×2): qty 1000

## 2014-11-01 MED ORDER — CEFAZOLIN SODIUM-DEXTROSE 2-3 GM-% IV SOLR
INTRAVENOUS | Status: AC
  Filled 2014-11-01: qty 50

## 2014-11-01 MED ORDER — SUFENTANIL CITRATE 50 MCG/ML IV SOLN
INTRAVENOUS | Status: DC | PRN
  Administered 2014-11-01 (×3): 10 ug via INTRAVENOUS
  Administered 2014-11-01 (×2): 5 ug via INTRAVENOUS

## 2014-11-01 MED ORDER — FENTANYL CITRATE (PF) 100 MCG/2ML IJ SOLN
INTRAMUSCULAR | Status: AC
  Filled 2014-11-01: qty 2

## 2014-11-01 MED ORDER — SUCCINYLCHOLINE CHLORIDE 20 MG/ML IJ SOLN
INTRAMUSCULAR | Status: DC | PRN
  Administered 2014-11-01: 100 mg via INTRAVENOUS
  Administered 2014-11-01: 20 mg via INTRAVENOUS

## 2014-11-01 MED ORDER — DEXAMETHASONE SODIUM PHOSPHATE 10 MG/ML IJ SOLN
INTRAMUSCULAR | Status: DC | PRN
  Administered 2014-11-01: 10 mg via INTRAVENOUS

## 2014-11-01 MED ORDER — LACTATED RINGERS IV SOLN
INTRAVENOUS | Status: DC | PRN
  Administered 2014-11-01 (×2): via INTRAVENOUS

## 2014-11-01 MED ORDER — MIDAZOLAM HCL 5 MG/5ML IJ SOLN
INTRAMUSCULAR | Status: DC | PRN
  Administered 2014-11-01: 2 mg via INTRAVENOUS

## 2014-11-01 MED ORDER — PANTOPRAZOLE SODIUM 40 MG PO TBEC
40.0000 mg | DELAYED_RELEASE_TABLET | Freq: Every day | ORAL | Status: DC
  Administered 2014-11-02: 40 mg via ORAL
  Filled 2014-11-01 (×2): qty 1

## 2014-11-01 MED ORDER — PROMETHAZINE HCL 25 MG/ML IJ SOLN: 6.2500 mg | INTRAMUSCULAR | Status: DC | PRN

## 2014-11-01 MED ORDER — PHENYLEPHRINE HCL 10 MG/ML IJ SOLN
INTRAMUSCULAR | Status: DC | PRN
  Administered 2014-11-01 (×3): 120 ug via INTRAVENOUS

## 2014-11-01 MED ORDER — MIDAZOLAM HCL 2 MG/2ML IJ SOLN
INTRAMUSCULAR | Status: AC
  Filled 2014-11-01: qty 2

## 2014-11-01 MED ORDER — ONDANSETRON HCL 4 MG/2ML IJ SOLN
INTRAMUSCULAR | Status: AC
  Filled 2014-11-01: qty 2

## 2014-11-01 MED ORDER — ACETAMINOPHEN 325 MG PO TABS
650.0000 mg | ORAL_TABLET | ORAL | Status: DC | PRN
  Administered 2014-11-01: 650 mg via ORAL
  Administered 2014-11-02: 325 mg via ORAL
  Filled 2014-11-01 (×2): qty 2

## 2014-11-01 MED ORDER — CISATRACURIUM BESYLATE (PF) 10 MG/5ML IV SOLN
INTRAVENOUS | Status: DC | PRN
  Administered 2014-11-01: 2 mg via INTRAVENOUS
  Administered 2014-11-01: 4 mg via INTRAVENOUS

## 2014-11-01 SURGICAL SUPPLY — 39 items
APL SKNCLS STERI-STRIP NONHPOA (GAUZE/BANDAGES/DRESSINGS) ×1
ATTRACTOMAT 16X20 MAGNETIC DRP (DRAPES) ×2 IMPLANT
BENZOIN TINCTURE PRP APPL 2/3 (GAUZE/BANDAGES/DRESSINGS) ×1 IMPLANT
BLADE HEX COATED 2.75 (ELECTRODE) ×2 IMPLANT
BLADE SURG 15 STRL LF DISP TIS (BLADE) ×1 IMPLANT
BLADE SURG 15 STRL SS (BLADE) ×2
CHLORAPREP W/TINT 10.5 ML (MISCELLANEOUS) ×2 IMPLANT
CLIP TI MEDIUM 6 (CLIP) ×4 IMPLANT
CLIP TI WIDE RED SMALL 6 (CLIP) ×4 IMPLANT
CLOSURE STERI-STRIP 1/4X4 (GAUZE/BANDAGES/DRESSINGS) ×1 IMPLANT
DRAPE LAPAROTOMY T 98X78 PEDS (DRAPES) ×2 IMPLANT
DRESSING SURGICEL FIBRLLR 1X2 (HEMOSTASIS) ×1 IMPLANT
DRSG SURGICEL FIBRILLAR 1X2 (HEMOSTASIS) ×2
ELECT REM PT RETURN 9FT ADLT (ELECTROSURGICAL) ×2
ELECTRODE REM PT RTRN 9FT ADLT (ELECTROSURGICAL) ×1 IMPLANT
GAUZE SPONGE 4X4 12PLY STRL (GAUZE/BANDAGES/DRESSINGS) ×1 IMPLANT
GAUZE SPONGE 4X4 16PLY XRAY LF (GAUZE/BANDAGES/DRESSINGS) ×3 IMPLANT
GLOVE SURG ORTHO 8.0 STRL STRW (GLOVE) ×2 IMPLANT
GOWN STRL REUS W/TWL XL LVL3 (GOWN DISPOSABLE) ×6 IMPLANT
KIT BASIN OR (CUSTOM PROCEDURE TRAY) ×2 IMPLANT
LIQUID BAND (GAUZE/BANDAGES/DRESSINGS) IMPLANT
NDL HYPO 25X1 1.5 SAFETY (NEEDLE) ×1 IMPLANT
NEEDLE HYPO 25X1 1.5 SAFETY (NEEDLE) ×2 IMPLANT
PACK BASIC VI WITH GOWN DISP (CUSTOM PROCEDURE TRAY) ×2 IMPLANT
PENCIL BUTTON HOLSTER BLD 10FT (ELECTRODE) ×2 IMPLANT
STAPLER VISISTAT 35W (STAPLE) ×2 IMPLANT
STRIP CLOSURE SKIN 1/2X4 (GAUZE/BANDAGES/DRESSINGS) IMPLANT
SUT MNCRL AB 4-0 PS2 18 (SUTURE) ×2 IMPLANT
SUT SILK 2 0 (SUTURE)
SUT SILK 2-0 18XBRD TIE 12 (SUTURE) IMPLANT
SUT SILK 3 0 (SUTURE)
SUT SILK 3-0 18XBRD TIE 12 (SUTURE) IMPLANT
SUT VIC AB 3-0 SH 18 (SUTURE) ×3 IMPLANT
SYR BULB IRRIGATION 50ML (SYRINGE) ×2 IMPLANT
SYR CONTROL 10ML LL (SYRINGE) ×2 IMPLANT
TAPE CLOTH SURG 4X10 WHT LF (GAUZE/BANDAGES/DRESSINGS) ×1 IMPLANT
TOWEL OR 17X26 10 PK STRL BLUE (TOWEL DISPOSABLE) ×2 IMPLANT
TOWEL OR NON WOVEN STRL DISP B (DISPOSABLE) ×2 IMPLANT
YANKAUER SUCT BULB TIP 10FT TU (MISCELLANEOUS) ×2 IMPLANT

## 2014-11-01 NOTE — Anesthesia Postprocedure Evaluation (Signed)
  Anesthesia Post-op Note  Patient: Lynn Jenkins  Procedure(s) Performed: Procedure(s): NECK EXPLORATION AND PARATHYROIDECTOMY (N/A)  Patient Location: PACU  Anesthesia Type:General  Level of Consciousness: awake and alert   Airway and Oxygen Therapy: Patient Spontanous Breathing  Post-op Pain: mild  Post-op Assessment: Post-op Vital signs reviewed and Patient's Cardiovascular Status Stable  Post-op Vital Signs: Reviewed and stable  Last Vitals:  Filed Vitals:   11/01/14 1245  BP: 151/85  Pulse: 81  Temp:   Resp: 13    Complications: No apparent anesthesia complications

## 2014-11-01 NOTE — Transfer of Care (Signed)
Immediate Anesthesia Transfer of Care Note  Patient: Lynn Jenkins  Procedure(s) Performed: Procedure(s): NECK EXPLORATION AND PARATHYROIDECTOMY (N/A)  Patient Location: PACU  Anesthesia Type:General  Level of Consciousness: awake, alert , oriented and patient cooperative  Airway & Oxygen Therapy: Patient Spontanous Breathing and Patient connected to face mask oxygen  Post-op Assessment: Report given to RN, Post -op Vital signs reviewed and stable and Patient moving all extremities X 4  Post vital signs: stable  Last Vitals:  Filed Vitals:   11/01/14 1102  BP: 143/73  Pulse: 77  Temp: 36.6 C  Resp: 7    Complications: No apparent anesthesia complications

## 2014-11-01 NOTE — Progress Notes (Signed)
Quick Note:  Pre-operative chest x-ray is acceptable for scheduled surgery.  Shakya Sebring M. Kathren Scearce, MD, FACS [facility] Office: [phone]   ______ 

## 2014-11-01 NOTE — Anesthesia Procedure Notes (Signed)
Procedure Name: Intubation Date/Time: 11/01/2014 9:32 AM Performed by: Lissa Morales Pre-anesthesia Checklist: Patient identified, Emergency Drugs available, Suction available, Patient being monitored and Timeout performed Patient Re-evaluated:Patient Re-evaluated prior to inductionOxygen Delivery Method: Circle System Utilized Preoxygenation: Pre-oxygenation with 100% oxygen Intubation Type: IV induction Laryngoscope Size: Miller and 2 Grade View: Grade I Tube type: Oral Tube size: 7.5 mm Number of attempts: 1 Airway Equipment and Method: Stylet Placement Confirmation: ETT inserted through vocal cords under direct vision,  positive ETCO2 and breath sounds checked- equal and bilateral Secured at: 23 cm Tube secured with: Tape Dental Injury: Teeth and Oropharynx as per pre-operative assessment

## 2014-11-01 NOTE — Progress Notes (Signed)
Quick Note:  These results are acceptable for scheduled surgery.  Jobani Sabado M. Taneshia Lorence, MD, FACS [facility] Office: [phone]   ______ 

## 2014-11-01 NOTE — Interval H&P Note (Signed)
History and Physical Interval Note:  11/01/2014 9:06 AM  Lynn Jenkins  has presented today for surgery, with the diagnosis of PRIMARY HYPERPARATHYROIDISM.  The various methods of treatment have been discussed with the patient and family. After consideration of risks, benefits and other options for treatment, the patient has consented to    Procedure(s): NECK EXPLORATION AND PARATHYROIDECTOMY (N/A) as a surgical intervention .    The patient's history has been reviewed, patient examined, no change in status, stable for surgery.  I have reviewed the patient's chart and labs.  Questions were answered to the patient's satisfaction.    Earnstine Regal, MD, [facility] Office: [place]

## 2014-11-01 NOTE — Brief Op Note (Signed)
11/01/2014  11:01 AM  PATIENT:  Devonia Farro  69 y.o. female  PRE-OPERATIVE DIAGNOSIS:  PRIMARY PARATHYROIDISM  POST-OPERATIVE DIAGNOSIS:  PRIMARY PARATHYROIDISM  PROCEDURE:  Procedure(s): NECK EXPLORATION AND PARATHYROIDECTOMY (N/A)  SURGEON:  Surgeon(s) and Role:    * Armandina Gemma, MD - Primary  ANESTHESIA:   general  EBL:  Total I/O In: 1000 [I.V.:1000] Out: 100 [Blood:100]  BLOOD ADMINISTERED:none  DRAINS: none   LOCAL MEDICATIONS USED:  NONE  SPECIMEN:  Excision  DISPOSITION OF SPECIMEN:  PATHOLOGY  COUNTS:  YES  TOURNIQUET:  * No tourniquets in log *  DICTATION: .Other Dictation: Dictation Number [id number]  PLAN OF CARE: Admit for overnight observation  PATIENT DISPOSITION:  PACU - hemodynamically stable.   Delay start of Pharmacological VTE agent (>24hrs) due to surgical blood loss or risk of bleeding: yes  Earnstine Regal, MD, [facility] Office: [phone]

## 2014-11-02 ENCOUNTER — Encounter (HOSPITAL_COMMUNITY): Payer: Self-pay | Admitting: Surgery

## 2014-11-02 DIAGNOSIS — E210 Primary hyperparathyroidism: Secondary | ICD-10-CM | POA: Diagnosis not present

## 2014-11-02 LAB — BASIC METABOLIC PANEL
Anion gap: 7 (ref 5–15)
BUN: 16 mg/dL (ref 6–20)
CO2: 26 mmol/L (ref 22–32)
Calcium: 10.1 mg/dL (ref 8.9–10.3)
Chloride: 107 mmol/L (ref 101–111)
Creatinine, Ser: 0.74 mg/dL (ref 0.44–1.00)
GFR calc Af Amer: >60 mL/min (ref >60)
GFR calc non Af Amer: >60 mL/min (ref >60)
Glucose, Bld: 153 mg/dL — ABNORMAL HIGH (ref 65–99)
Potassium: 4.5 mmol/L (ref 3.5–5.1)
Sodium: 140 mmol/L (ref 135–145)

## 2014-11-02 MED ORDER — ALUM & MAG HYDROXIDE-SIMETH 200-200-20 MG/5ML PO SUSP
30.0000 mL | Freq: Four times a day (QID) | ORAL | Status: DC | PRN
  Administered 2014-11-02: 30 mL via ORAL
  Filled 2014-11-02: qty 30

## 2014-11-02 MED ORDER — HYDROCODONE-ACETAMINOPHEN 5-325 MG PO TABS: 1.0000 | ORAL_TABLET | ORAL | Status: DC | PRN

## 2014-11-02 NOTE — Discharge Summary (Signed)
Physician Discharge Summary [facility]  Patient ID: Lynn Jenkins MRN: [id number] DOB/AGE: 10-12-1945 69 y.o.  Admit date: 11/01/2014 Discharge date: 11/02/2014  Admission Diagnoses:  Primary hyperparathyroidism  Discharge Diagnoses:  Principal Problem:   Hyperparathyroidism, primary Active Problems:   Primary hyperparathyroidism   Discharged Condition: good  Hospital Course: Patient was admitted for observation following parathyroid surgery.  Post op course was uncomplicated.  Pain was well controlled.  Tolerated diet.  Post op calcium level on morning following surgery was 10.1 mg/dl.  Patient was prepared for discharge home on POD#1.  Consults: None  Treatments: surgery: neck exploration and parathyroidectomy  Discharge Exam: Blood pressure 129/67, pulse 88, temperature 98.8 F (37.1 C), temperature source Oral, resp. rate 16, height 5\' 3"  (1.6 m), weight 92.987 kg (205 lb), SpO2 95 %. HEENT - clear Neck - wound dry and intact; mild STS; voice normal Chest - clear bilaterally Cor - RRR  Disposition: Home  Discharge Instructions    Diet - low sodium heart healthy    Complete by:  As directed      Discharge instructions    Complete by:  As directed   [facility]  THYROID & PARATHYROID SURGERY:  POST-OP INSTRUCTIONS  Always review your discharge instruction sheet from the facility where your surgery was performed.  A prescription for pain medication may be given to you upon discharge.  Take your pain medication as prescribed.  If narcotic pain medicine is not needed, then you may take acetaminophen (Tylenol) or ibuprofen (Advil) as needed.  Take your usually prescribed medications unless otherwise directed.  If you need a refill on your pain medication, please contact your pharmacy. They will contact our office to request authorization.  Prescriptions will not be processed by our office after 5 pm or on weekends.  Start with a  light diet upon arrival home, such as soup and crackers or toast.  Be sure to drink plenty of fluids daily.  Resume your normal diet the day after surgery.  Most patients will experience some swelling and bruising on the chest and neck area.  Ice packs will help.  Swelling and bruising can take several days to resolve.   It is common to experience some constipation after surgery.  Increasing fluid intake and taking a stool softener will usually help or prevent this problem.  A mild laxative (Milk of Magnesia or Miralax) should be taken according to package directions if there has been no bowel movement after 48 hours.  You have steri-strips and a gauze dressing over your incision.  You may remove the gauze bandage on the second day after surgery, and you may shower at that time.  Leave your steri-strips (small skin tapes) in place directly over the incision.  These strips should remain on the skin for 5-7 days and then be removed.  You may get them wet in the shower and pat them dry.  You may resume regular (light) daily activities beginning the next day - such as daily self-care, walking, climbing stairs - gradually increasing activities as tolerated.  You may have sexual intercourse when it is comfortable.  Refrain from any heavy lifting or straining until approved by your doctor.  You may drive when you no longer are taking prescription pain medication, you can comfortably wear a seatbelt, and you can safely maneuver your car and apply brakes.  You should see your doctor in the office for a follow-up appointment approximately two to three weeks after  your surgery.  Make sure that you call for this appointment within a day or two after you arrive home to insure a convenient appointment time.  WHEN TO CALL YOUR DOCTOR: -- Fever greater than 101.5 -- Inability to urinate -- Nausea and/or vomiting - persistent -- Extreme swelling or bruising -- Continued bleeding from incision -- Increased pain,  redness, or drainage from the incision -- Difficulty swallowing or breathing -- Muscle cramping or spasms -- Numbness or tingling in hands or around lips  The clinic staff is available to answer your questions during regular business hours.  Please don't hesitate to call and ask to speak to one of the nurses if you have concerns.  Earnstine Regal, MD, [facility] Office: [phone]  Website: [email or web address]     Increase activity slowly    Complete by:  As directed      Remove dressing in 24 hours    Complete by:  As directed             Medication List    TAKE these medications        acetaminophen 500 MG tablet  Commonly known as:  TYLENOL  Take 500-1,000 mg by mouth every 6 (six) hours as needed for mild pain or moderate pain.     BIOTIN PO  Take by mouth.     cholecalciferol 1000 UNITS tablet  Commonly known as:  VITAMIN D  Take 1,000 Units by mouth daily.     co-enzyme Q-10 30 MG capsule  Take 30 mg by mouth daily.     esomeprazole 40 MG capsule  Commonly known as:  NEXIUM  Take 40 mg by mouth 2 (two) times daily before a meal.     fenofibrate 160 MG tablet  Take 160 mg by mouth daily.     ferrous sulfate 325 (65 FE) MG tablet  Take 325 mg by mouth daily with breakfast.     GLUCOSAMINE 1500 COMPLEX Caps  Take 1 capsule by mouth 2 (two) times daily.     HYDROcodone-acetaminophen 5-325 MG per tablet  Commonly known as:  NORCO/VICODIN  Take 1-2 tablets by mouth every 4 (four) hours as needed for moderate pain.     meloxicam 15 MG tablet  Commonly known as:  MOBIC  Take 15 mg by mouth at bedtime.     metoCLOPramide 10 MG tablet  Commonly known as:  REGLAN  Take 10 mg by mouth 4 (four) times daily.     multivitamin with minerals Tabs tablet  Take 1 tablet by mouth daily.     Omega 3 1000 MG Caps  Take 2,000 mg by mouth 2 (two) times daily.     TOTAL MEMORY & FOCUS FORMULA PO   Take 2 tablets by mouth daily.     vitamin B-12 1000 MCG tablet  Commonly known as:  CYANOCOBALAMIN  Take 1,000 mcg by mouth daily.     vitamin C 500 MG tablet  Commonly known as:  ASCORBIC ACID  Take 500 mg by mouth daily.           Follow-up Information    Follow up with Earnstine Regal, MD. Schedule an appointment as soon as possible for a visit in 3 weeks.   Specialty:  General Surgery   Why:  For wound re-check   Contact information:   [address] [place] [address] [phone]       Earnstine Regal,  MD, [facility] Office: [phone]   Signed: Earnstine Regal 11/02/2014, 7:30 AM

## 2014-11-02 NOTE — Progress Notes (Signed)
11/02/14 0830  Reviewed discharge instructions with patient. Patient verbalized understanding of discharge instructions. Applies dry gauze over steri strips/incision. No drainage noticed. Copy of discharge instructions and prescription given to patient.

## 2014-11-02 NOTE — Op Note (Signed)
Lynn Jenkins, RA NO.:  [id number]  MEDICAL RECORD NO.:  [id number]  LOCATION:                                 FACILITY:  PHYSICIAN:  Earnstine Regal, MD      DATE OF BIRTH:  05-28-1946  DATE OF PROCEDURE:  11/01/2014                               OPERATIVE REPORT   LOCATION:  [facility]  PREOPERATIVE DIAGNOSIS:  Primary hyperparathyroidism.  POSTOPERATIVE DIAGNOSIS:  Primary hyperparathyroidism.  PROCEDURE:  Neck exploration and right parathyroidectomy.  SURGEON:  Armandina Gemma, MD, FACS  ANESTHESIA:  General.  ESTIMATED BLOOD LOSS:  Minimal.  PREPARATION:  ChloraPrep.  COMPLICATIONS:  None.  INDICATIONS:  The patient is a 69 year old female referred by Dr. Jacelyn Pi with primary hyperparathyroidism.  The patient had elevated serum calcium at 11.5.  Intact PTH level was inappropriately in the upper range of normal at 53.  Nuclear medicine parathyroid Scan failed to localize a parathyroid adenoma.  Likewise a thyroid ultrasound failed to localize a parathyroid adenoma.  The patient did have bilateral thyroid nodules.  She now comes to Surgery for exploration for hyperparathyroidism.  BODY OF REPORT:  Procedure was done in OR #2 at the [facility].  The patient was brought to the operating room, placed in supine position on the operating room table.  Following administration of general anesthesia, the patient was positioned and then prepped and draped in usual aseptic fashion.  After ascertaining that an adequate level of anesthesia had been achieved, a Kocher incision was made with a #15 blade.  Dissection was carried through subcutaneous tissues and platysma.  Hemostasis was achieved with electrocautery.  Skin flaps were elevated cephalad and caudad from the thyroid notch to the sternal notch.  A Mahorner self-retaining retractor was placed for exposure.  Strap muscles were incised in the midline. Dissection  was begun on the left.  Left thyroid lobe was mildly enlarged and somewhat nodular.  There was a moderate nodule protruding posteriorly and inferiorly.  There appears to be a normal parathyroid gland on the surface of the thyroid in the left inferior  position. Neck was explored mobilizing the entire left lobe.  Dissection was carried back to the precervical fascia.  Retroesophageal space was opened.  Thyrothymic tract was opened.  Superior pole was explored. There was no obvious evidence of enlarged parathyroid gland present. Dry pack was placed in the left neck.  Next, we turned our attention to the right thyroid lobe.  Again strap muscles were reflected laterally.  Right lobe was mobilized and venous tributaries divided between Ligaclips.  The entire right lobe was mobilized back to the precervical fascia.  Superior pole was explored. There are prominent nodules on the surface of the right lobe of the thyroid.  Overlying one of the nodules was an enlarged parathyroid gland in the central portion of the right lobe.  This was gently  dissected off the thyroid capsule.  This gland appears  enlarged measuring at least 1 cm in dimension.  It was  dissected out and excised in its entirety and submitted to Pathology.  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  Further dissection on the right including the thyrothymic tract in the tracheoesophageal groove and the carotid sheath fails to reveal an enlarged parathyroid gland.  A decision was made to cease exploration at this point.  The neck was irrigated with warm saline.  Fibrillar was placed throughout the operative field.  Good hemostasis was noted.  Strap muscles were reapproximated in the midline with interrupted 3-0 Vicryl sutures.  Platysma was closed with interrupted 3-0 Vicryl sutures.  Skin was closed with a running 4-0 Monocryl subcuticular suture.   Wound was washed and dried and benzoin and Steri-Strips were applied.  Sterile dressings were applied.  The patient was awakened from anesthesia and brought to the recovery room.  The patient tolerated the procedure well.   Earnstine Regal, MD, [facility] Office: [phone]   TMG/MEDQ  D:  11/01/2014  T:  11/02/2014  Job:  [id number]  cc:   Jacelyn Pi, M.D. Fax: [phone]  Myriam Jacobson, MD [facility]  Acquanetta Sit, MD

## 2014-11-30 ENCOUNTER — Other Ambulatory Visit: Payer: Self-pay | Admitting: Surgery

## 2014-11-30 DIAGNOSIS — D351 Benign neoplasm of parathyroid gland: Secondary | ICD-10-CM

## 2014-11-30 DIAGNOSIS — Z9889 Other specified postprocedural states: Secondary | ICD-10-CM

## 2014-11-30 DIAGNOSIS — E892 Postprocedural hypoparathyroidism: Secondary | ICD-10-CM

## 2014-11-30 DIAGNOSIS — Z9089 Acquired absence of other organs: Secondary | ICD-10-CM

## 2015-01-08 NOTE — Progress Notes (Signed)
Patient ID: Lynn Jenkins, female   DOB: 06-09-1946, 69 y.o.   MRN: [id number] This patient was never seen

## 2015-02-03 ENCOUNTER — Other Ambulatory Visit: Payer: Self-pay | Admitting: Family Medicine

## 2015-02-03 ENCOUNTER — Ambulatory Visit
Admission: RE | Admit: 2015-02-03 | Discharge: 2015-02-03 | Disposition: A | Discharge status: Home / Self Care | Payer: BLUE CROSS/BLUE SHIELD | Source: Ambulatory Visit | Attending: Family Medicine | Admitting: Family Medicine

## 2015-02-03 DIAGNOSIS — S90122A Contusion of left lesser toe(s) without damage to nail, initial encounter: Secondary | ICD-10-CM

## 2015-03-08 ENCOUNTER — Encounter (HOSPITAL_COMMUNITY)
Admission: RE | Admit: 2015-03-08 | Discharge: 2015-03-08 | Disposition: A | Discharge status: Home / Self Care | Payer: BLUE CROSS/BLUE SHIELD | Source: Ambulatory Visit | Attending: Surgery | Admitting: Surgery

## 2015-03-08 ENCOUNTER — Encounter (HOSPITAL_COMMUNITY): Payer: BLUE CROSS/BLUE SHIELD

## 2015-03-08 DIAGNOSIS — Z9889 Other specified postprocedural states: Secondary | ICD-10-CM | POA: Diagnosis not present

## 2015-03-08 MED ORDER — TECHNETIUM TC 99M SESTAMIBI - CARDIOLITE
25.3000 | Freq: Once | INTRAVENOUS | Status: AC | PRN
  Administered 2015-03-08: 11:00:00 25 via INTRAVENOUS

## 2015-03-13 ENCOUNTER — Other Ambulatory Visit: Payer: Self-pay | Admitting: Surgery

## 2015-03-13 DIAGNOSIS — E213 Hyperparathyroidism, unspecified: Secondary | ICD-10-CM

## 2015-03-20 ENCOUNTER — Ambulatory Visit
Admission: RE | Admit: 2015-03-20 | Discharge: 2015-03-20 | Disposition: A | Discharge status: Home / Self Care | Payer: BLUE CROSS/BLUE SHIELD | Source: Ambulatory Visit | Attending: Surgery | Admitting: Surgery

## 2015-03-20 MED ORDER — IOPAMIDOL (ISOVUE-370) INJECTION 76%
75.0000 mL | Freq: Once | INTRAVENOUS | Status: AC | PRN
  Administered 2015-03-20: 75 mL via INTRAVENOUS

## 2015-04-04 ENCOUNTER — Other Ambulatory Visit (HOSPITAL_COMMUNITY): Payer: Self-pay | Admitting: Interventional Radiology

## 2015-04-04 DIAGNOSIS — E210 Primary hyperparathyroidism: Secondary | ICD-10-CM

## 2015-04-05 ENCOUNTER — Other Ambulatory Visit: Payer: Self-pay | Admitting: Radiology

## 2015-04-06 ENCOUNTER — Other Ambulatory Visit: Payer: Self-pay | Admitting: Radiology

## 2015-04-07 ENCOUNTER — Other Ambulatory Visit: Payer: Self-pay | Admitting: Radiology

## 2015-04-07 ENCOUNTER — Encounter (HOSPITAL_COMMUNITY): Payer: Self-pay

## 2015-04-07 ENCOUNTER — Ambulatory Visit (HOSPITAL_COMMUNITY)
Admission: RE | Admit: 2015-04-07 | Discharge: 2015-04-07 | Disposition: A | Discharge status: Home / Self Care | Payer: BLUE CROSS/BLUE SHIELD | Source: Ambulatory Visit | Attending: Interventional Radiology | Admitting: Interventional Radiology

## 2015-04-07 ENCOUNTER — Other Ambulatory Visit (HOSPITAL_COMMUNITY): Payer: Self-pay | Admitting: Interventional Radiology

## 2015-04-07 DIAGNOSIS — E213 Hyperparathyroidism, unspecified: Secondary | ICD-10-CM | POA: Diagnosis not present

## 2015-04-07 DIAGNOSIS — G43909 Migraine, unspecified, not intractable, without status migrainosus: Secondary | ICD-10-CM | POA: Insufficient documentation

## 2015-04-07 DIAGNOSIS — D34 Benign neoplasm of thyroid gland: Secondary | ICD-10-CM | POA: Diagnosis not present

## 2015-04-07 DIAGNOSIS — D649 Anemia, unspecified: Secondary | ICD-10-CM | POA: Insufficient documentation

## 2015-04-07 DIAGNOSIS — M1990 Unspecified osteoarthritis, unspecified site: Secondary | ICD-10-CM | POA: Diagnosis not present

## 2015-04-07 DIAGNOSIS — E210 Primary hyperparathyroidism: Secondary | ICD-10-CM

## 2015-04-07 DIAGNOSIS — K219 Gastro-esophageal reflux disease without esophagitis: Secondary | ICD-10-CM | POA: Insufficient documentation

## 2015-04-07 DIAGNOSIS — D351 Benign neoplasm of parathyroid gland: Secondary | ICD-10-CM | POA: Diagnosis not present

## 2015-04-07 LAB — CBC
HCT: 39.7 % (ref 36.0–46.0)
Hemoglobin: 12.3 g/dL (ref 12.0–15.0)
MCH: 26.6 pg (ref 26.0–34.0)
MCHC: 31 g/dL (ref 30.0–36.0)
MCV: 85.9 fL (ref 78.0–100.0)
Platelets: 197 10*3/uL (ref 150–400)
RBC: 4.62 MIL/uL (ref 3.87–5.11)
RDW: 15.7 % — ABNORMAL HIGH (ref 11.5–15.5)
WBC: 5.7 10*3/uL (ref 4.0–10.5)

## 2015-04-07 LAB — BASIC METABOLIC PANEL
Anion gap: 5 (ref 5–15)
BUN: 16 mg/dL (ref 6–20)
CO2: 28 mmol/L (ref 22–32)
Calcium: 10.8 mg/dL — ABNORMAL HIGH (ref 8.9–10.3)
Chloride: 106 mmol/L (ref 101–111)
Creatinine, Ser: 0.81 mg/dL (ref 0.44–1.00)
GFR calc Af Amer: >60 mL/min (ref >60)
GFR calc non Af Amer: >60 mL/min (ref >60)
Glucose, Bld: 100 mg/dL — ABNORMAL HIGH (ref 65–99)
Potassium: 4 mmol/L (ref 3.5–5.1)
Sodium: 139 mmol/L (ref 135–145)

## 2015-04-07 LAB — PROTIME-INR
INR: 1.05 (ref 0.00–1.49)
Prothrombin Time: 13.9 seconds (ref 11.6–15.2)

## 2015-04-07 LAB — APTT: aPTT: 33 seconds (ref 24–37)

## 2015-04-07 MED ORDER — MIDAZOLAM HCL 2 MG/2ML IJ SOLN
INTRAMUSCULAR | Status: AC | PRN
  Administered 2015-04-07: 1 mg via INTRAVENOUS

## 2015-04-07 MED ORDER — LIDOCAINE HCL 1 % IJ SOLN
INTRAMUSCULAR | Status: AC
  Filled 2015-04-07: qty 20

## 2015-04-07 MED ORDER — SODIUM CHLORIDE 0.9 % IV SOLN
INTRAVENOUS | Status: AC | PRN
  Administered 2015-04-07: 10 mL/h via INTRAVENOUS

## 2015-04-07 MED ORDER — MIDAZOLAM HCL 2 MG/2ML IJ SOLN
INTRAMUSCULAR | Status: AC
  Filled 2015-04-07: qty 6

## 2015-04-07 MED ORDER — FENTANYL CITRATE (PF) 100 MCG/2ML IJ SOLN
INTRAMUSCULAR | Status: AC
  Filled 2015-04-07: qty 6

## 2015-04-07 MED ORDER — FENTANYL CITRATE (PF) 100 MCG/2ML IJ SOLN
INTRAMUSCULAR | Status: AC | PRN
  Administered 2015-04-07: 50 ug via INTRAVENOUS

## 2015-04-07 MED ORDER — IOHEXOL 300 MG/ML  SOLN
100.0000 mL | Freq: Once | INTRAMUSCULAR | Status: DC | PRN
  Administered 2015-04-07: 70 mL via INTRAVENOUS
  Filled 2015-04-07: qty 100

## 2015-04-07 MED ORDER — SODIUM CHLORIDE 0.9 % IV SOLN: Freq: Once | INTRAVENOUS | Status: DC

## 2015-04-07 NOTE — Sedation Documentation (Signed)
Patient is resting comfortably. Pt dozes intermittently, aroused by voice.

## 2015-04-07 NOTE — Sedation Documentation (Signed)
Patient denies pain and is resting comfortably.  

## 2015-04-07 NOTE — Sedation Documentation (Signed)
Patient is resting comfortably. 

## 2015-04-07 NOTE — Procedures (Signed)
Interventional Radiology Procedure Note  Procedure:  Parathyroid venous sampling   Complications:  None  Access:  Right CFV, 6 Fr sheath   Estimated Blood Loss:  < 10 mL  Bilateral venous sampling at multiple levels.  Total of 10 samples.  Full report to be dictated.  Venetia Night. Kathlene Cote, M.D Pager:  [phone]

## 2015-04-07 NOTE — Sedation Documentation (Addendum)
Patient denies pain and is resting comfortably.  

## 2015-04-07 NOTE — H&P (Signed)
Chief Complaint: Patient was seen in consultation today for parathyroid venous sampling at the request of Dr Harlow Asa  Referring Physician(s): Dr Armandina Gemma  History of Present Illness: Lynn Jenkins is a 69 y.o. female   Pt with Hx hyperparathyroidism; hypercalcemia + parathyroid adenoma Rt parathyroidectomy 11/01/2014 Pt continues to suffer from severe fatigue and hair loss Also noted continues with hyperparathyroid level and hypercalcemia  NM study 9/28: IMPRESSION: 1. There is persistent radiotracer uptake the areas of both lobes of the thyroid gland on 2 hour delayed images. There is no dominant focus of persistent increased uptake to identify a location of parathyroid adenoma. The scan does not appear significantly changed from 07/25/2014. CT study 10/10: IMPRESSION: Prior thyroid surgery bilaterally. 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  No hyper enhancing nodule is seen to suggest parathyroid adenoma  11 x 16 mm right level 2 lymph node. This is prominent and may be a reactive node.  Now scheduled for selective venous sampling of parathyroid in IR  Past Medical History  Diagnosis Date  . Varicose veins   . Anemia   . GERD (gastroesophageal reflux disease)   . History of hiatal hernia   . Headache     hx of migraines   . Arthritis     Past Surgical History  Procedure Laterality Date  . Joint replacement    . Abdominal hysterectomy    . Shoulder surgery    . Appendectomy    . Left foot surgery     . Parathyroidectomy N/A 11/01/2014    Procedure: NECK EXPLORATION AND PARATHYROIDECTOMY;  Surgeon: Armandina Gemma, MD;  Location: [facility];  Service: General;  Laterality: N/A;    Allergies: Morphine and related and Zomig  Medications: Prior to Admission medications   Medication Sig Start Date End Date Taking? Authorizing Provider  acetaminophen (TYLENOL) 500 MG tablet Take 500-1,000 mg by mouth every 6 (six) hours as  needed for mild pain or moderate pain.   Yes Historical Provider, MD  BIOTIN PO Take 1 capsule by mouth daily.    Yes Historical Provider, MD  cholecalciferol (VITAMIN D) 1000 UNITS tablet Take 1,000 Units by mouth daily.   Yes Historical Provider, MD  co-enzyme Q-10 30 MG capsule Take 30 mg by mouth daily.   Yes Historical Provider, MD  dorzolamide (TRUSOPT) 2 % ophthalmic solution Place 1 drop into both eyes daily. 03/18/15  Yes Historical Provider, MD  esomeprazole (NEXIUM) 40 MG capsule Take 40 mg by mouth 2 (two) times daily before a meal.    Yes Historical Provider, MD  fenofibrate 160 MG tablet Take 160 mg by mouth daily.   Yes Historical Provider, MD  Glucosamine-Chondroit-Vit C-Mn (GLUCOSAMINE 1500 COMPLEX) CAPS Take 1 capsule by mouth 2 (two) times daily.    Yes Historical Provider, MD  Nyoka Cowden Tea, [name], (GREEN TEA PO) Take 1 capsule by mouth daily.   Yes Historical Provider, MD  HYDROcodone-acetaminophen (NORCO/VICODIN) 5-325 MG per tablet Take 1-2 tablets by mouth every 4 (four) hours as needed for moderate pain. 11/02/14  Yes Armandina Gemma, MD  meloxicam (MOBIC) 15 MG tablet Take 15 mg by mouth at bedtime.   Yes Historical Provider, MD  metoCLOPramide (REGLAN) 10 MG tablet Take 10 mg by mouth 4 (four) times daily.   Yes Historical Provider, MD  Misc Natural Products (TOTAL MEMORY & FOCUS FORMULA PO) Take 2 tablets by mouth daily.   Yes Historical Provider, MD  Multiple Vitamin (MULTIVITAMIN WITH  MINERALS) TABS tablet Take 1 tablet by mouth daily.   Yes Historical Provider, MD  Omega 3 1000 MG CAPS Take 2,000 mg by mouth 2 (two) times daily.   Yes Historical Provider, MD  vitamin B-12 (CYANOCOBALAMIN) 1000 MCG tablet Take 1,000 mcg by mouth daily.   Yes Historical Provider, MD  vitamin C (ASCORBIC ACID) 500 MG tablet Take 500 mg by mouth daily as needed (when sick).    Yes Historical Provider, MD  XALATAN 0.005 % ophthalmic solution Place 1 drop into both eyes at bedtime. 03/18/15   Yes Historical Provider, MD  ferrous sulfate 325 (65 FE) MG tablet Take 325 mg by mouth daily with breakfast.    Historical Provider, MD     Family History  Problem Relation Age of Onset  . Heart disease Mother   . Hyperlipidemia Mother   . Hypertension Mother   . Heart disease Father   . Hyperlipidemia Father   . Hypertension Father   . Cancer Brother   . Hypertension Brother     Social History   Social History  . Marital Status: Married    Spouse Name: N/A  . Number of Children: N/A  . Years of Education: N/A   Social History Main Topics  . Smoking status: Never Smoker   . Smokeless tobacco: Never Used  . Alcohol Use: No  . Drug Use: No  . Sexual Activity: Not Asked   Other Topics Concern  . None   Social History Narrative    Review of Systems: A 12 point ROS discussed and pertinent positives are indicated in the HPI above.  All other systems are negative.  Review of Systems  Constitutional: Positive for fatigue. Negative for fever, activity change and unexpected weight change.  Eyes: Negative for visual disturbance.  Respiratory: Negative for shortness of breath.   Gastrointestinal: Negative for nausea and abdominal pain.  Musculoskeletal: Negative for neck pain and neck stiffness.  Neurological: Positive for weakness.  Psychiatric/Behavioral: Negative for behavioral problems and confusion.    Vital Signs: BP 170/107 mmHg  Pulse 78  Temp(Src) 98.1 F (36.7 C)  Resp 18  Ht 5\' 3"  (1.6 m)  Wt 195 lb (88.451 kg)  BMI 34.55 kg/m2  SpO2 98%  Physical Exam  Constitutional: She is oriented to person, place, and time. She appears well-nourished.  Neck: Normal range of motion.  Well healed scar from previous surgery  Cardiovascular: Normal rate, regular rhythm and normal heart sounds.   Pulmonary/Chest: Effort normal and breath sounds normal. She has no wheezes.  Abdominal: Soft. Bowel sounds are normal.  Musculoskeletal: Normal range of motion.    Neurological: She is alert and oriented to person, place, and time.  Skin: Skin is warm and dry.  Psychiatric: She has a normal mood and affect. Her behavior is normal. Judgment and thought content normal.  Nursing note and vitals reviewed.   Mallampati Score:  MD Evaluation Airway: WNL Heart: WNL Abdomen: WNL Chest/ Lungs: WNL ASA  Classification: 2 Mallampati/Airway Score: One  Imaging: Ct Soft Tissue Neck W Wo Contrast  03/20/2015  CLINICAL DATA:  Hyperparathyroidism. Hypercalcemia. 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 EXAM: CT NECK WITH AND WITHOUT CONTRAST TECHNIQUE: Multidetector CT imaging of the neck was performed without and with intravenous contrast. CONTRAST:  75 mL Isovue 300 IV COMPARISON:  Thyroid ultrasound 08/19/2014 FINDINGS: Multi phase CT of the neck was performed to evaluate for parathyroid adenoma. Unenhanced as well as arterial and venous phase scanning at 1 mm. Lung apices  are clear. Proximal great vessels are widely patent without significant atherosclerotic disease. There is mild plaque in the carotid bulb on the left. Prominent right level 2 lymph node measuring 11 x 16 mm. This does not show hyper enhancement or necrosis. Other small lymph nodes are benign appearing. Multiple thyroid nodules bilaterally. Surgical clips are present anterior to the right lobe of the thyroid and posterior and lateral to the left lobe of the thyroid compatible with prior surgery. Dominant nodule in the right thyroid measures 20 x 24 mm and has central nonenhancing component. Dominant left thyroid nodule 25 x 13 mm. Posterior nodule left lobe measures 12 mm and has a similar appearance to the other nodules without hyper enhancement or evidence of adenoma. Left lower pole nodule measures 13 x 12 mm. This has central low-density and is similar to the other thyroid nodules. Right inferior nodule measures 8 mm. No evidence of ectopic parathyroid adenoma. No hyper enhancing nodules  seen to suggest parathyroid adenoma. Cervical spondylosis of a moderate to advanced degree. IMPRESSION: Prior thyroid surgery bilaterally. 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 No hyper enhancing nodule is seen to suggest parathyroid adenoma 11 x 16 mm right level 2 lymph node. This is prominent and may be a reactive node. Close follow-up recommended to exclude malignancy. Electronically Signed   By: Franchot Gallo M.D.   On: 03/20/2015 17:20   Nm Parathyroid W/spect  03/08/2015  CLINICAL DATA:  Evaluate for parathyroid adenoma EXAM: NM PARATHYROID SCINTIGRAPHY AND SPECT IMAGING TECHNIQUE: Following intravenous administration of radiopharmaceutical, early and 2-hour delayed planar images were obtained in the anterior projection. Delayed triplanar SPECT images were also obtained at 2 hours. RADIOPHARMACEUTICALS:  25.3 mCi Tc-42m Sestamibi IV COMPARISON:  07/25/2014 FINDINGS: On the early images there is increased radiotracer uptake in both lobes of the thyroid gland. On the 2 hour delayed images there is persistent radiotracer activity within both lobes of the thyroid gland with only minimal washout from the right lobe. No dominant focus of persistent uptake is noted on either side of the thyroid. IMPRESSION: 1. There is persistent radiotracer uptake the areas of both lobes of the thyroid gland on 2 hour delayed images. There is no dominant focus of persistent increased uptake to identify a location of parathyroid adenoma. The scan does not appear significantly changed from 07/25/2014. Electronically Signed   By: Kerby Moors M.D.   On: 03/08/2015 15:28    Labs:  CBC:  Recent Labs  10/28/14 0900  WBC 5.7  HGB 12.5  HCT 40.2  PLT 245    COAGS: No results for input(s): INR, APTT in the last 8760 hours.  BMP:  Recent Labs  11/02/14 0455  NA 140  K 4.5  CL 107  CO2 26  GLUCOSE 153*  BUN 16  CALCIUM 10.1  CREATININE 0.74  GFRNONAA >60  GFRAA >60     LIVER FUNCTION TESTS: No results for input(s): BILITOT, AST, ALT, ALKPHOS, PROT, ALBUMIN in the last 8760 hours.  TUMOR MARKERS: No results for input(s): AFPTM, CEA, CA199, CHROMGRNA in the last 8760 hours.  Assessment and Plan:  Hyperparathyroidism Hypercalcemia Parathyroid resection 10/2014 Continued and worsening sxs Now scheduled for selective parathyroid venous sampling  Risks and Benefits discussed with the patient including, but not limited to bleeding, infection, vascular injury or contrast induced renal failure. All of the patient's questions were answered, patient is agreeable to proceed. Consent signed and in chart.  Thank you for this interesting consult.  I greatly enjoyed meeting Lynn Jenkins and look forward to participating in their care.  A copy of this report was sent to the requesting provider on this date.  Signed: Daymond Cordts A 04/07/2015, 11:12 AM   I spent a total of  30 Minutes   in face to face in clinical consultation, greater than 50% of which was counseling/coordinating care for parathyroid venous sampling

## 2015-04-07 NOTE — Discharge Instructions (Signed)
Needle Biopsy, Care After These instructions give you information about caring for yourself after your procedure. Your doctor may also give you more specific instructions. Call your doctor if you have any problems or questions after your procedure. HOME CARE  Rest as told by your doctor. 24 hours  Take medicines only as told by your doctor.  There are many different ways to close and cover the biopsy site, including stitches (sutures), skin glue, and adhesive strips. Follow instructions from your doctor about:  How to take care of your biopsy site.  When and how you should change your bandage (dressing). 24 hours remove dressing   When you should remove your dressing. You may Shower in 24 hours no baths or soaking in water for 3 days.  Removing whatever was used to close your biopsy site.  Check your biopsy site every day for signs of infection. Watch for:  Redness, swelling, or pain.  Fluid, blood, or pus. GET HELP IF:  You have a fever.  You have redness, swelling, or pain at the biopsy site, and it lasts longer than a few days.  You have fluid, blood, or pus coming from the biopsy site.  You feel sick to your stomach (nauseous).  You throw up (vomit). GET HELP RIGHT AWAY IF:  You are short of breath.  You have trouble breathing.  Your chest hurts.  You feel dizzy or you pass out (faint).  You have bleeding that does not stop with pressure or a bandage.  You cough up blood.  Your belly (abdomen) hurts.   This information is not intended to replace advice given to you by your health care provider. Make sure you discuss any questions you have with your health care provider.   Document Released: 05/09/2008 Document Revised: 10/11/2014 Document Reviewed: 05/23/2014 Elsevier Interactive Patient Education [employer].

## 2015-04-07 NOTE — Sedation Documentation (Signed)
Patient denies pain and is resting comfortably. Pt denies any need for additional medication at this time.

## 2015-04-08 LAB — PARATHYROID HORMONE, INTACT (NO CA)
PTH: 80 pg/mL — ABNORMAL HIGH (ref 15–65)
PTH: 101 pg/mL — ABNORMAL HIGH (ref 15–65)
PTH: 121 pg/mL — ABNORMAL HIGH (ref 15–65)
PTH: 130 pg/mL — ABNORMAL HIGH (ref 15–65)
PTH: 76 pg/mL — ABNORMAL HIGH (ref 15–65)
PTH: 88 pg/mL — ABNORMAL HIGH (ref 15–65)
PTH: 90 pg/mL — ABNORMAL HIGH (ref 15–65)
PTH: 94 pg/mL — ABNORMAL HIGH (ref 15–65)
PTH: 96 pg/mL — ABNORMAL HIGH (ref 15–65)
PTH: 86 pg/mL — ABNORMAL HIGH (ref 15–65)
PTH: 93 pg/mL — ABNORMAL HIGH (ref 15–65)

## 2015-04-13 NOTE — Progress Notes (Signed)
Quick Note:  Selective venous sampling results noted. 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  Claib[facility] Billings - please contact patient with this information and schedule MRI scan as recommended by Dr. Wallene Dales.  tmg  Earnstine Regal, MD, [facility] Office: [phone]   ______

## 2015-04-15 ENCOUNTER — Other Ambulatory Visit: Payer: Self-pay | Admitting: Surgery

## 2015-04-15 DIAGNOSIS — E210 Primary hyperparathyroidism: Secondary | ICD-10-CM

## 2015-04-25 ENCOUNTER — Ambulatory Visit
Admission: RE | Admit: 2015-04-25 | Discharge: 2015-04-25 | Disposition: A | Discharge status: Home / Self Care | Payer: BLUE CROSS/BLUE SHIELD | Source: Ambulatory Visit | Attending: Surgery | Admitting: Surgery

## 2015-04-25 DIAGNOSIS — E210 Primary hyperparathyroidism: Secondary | ICD-10-CM

## 2015-04-25 MED ORDER — GADOBENATE DIMEGLUMINE 529 MG/ML IV SOLN: 18.0000 mL | Freq: Once | INTRAVENOUS | Status: DC | PRN

## 2015-08-09 DEATH — deceased

## 2016-07-02 ENCOUNTER — Ambulatory Visit
Admission: RE | Admit: 2016-07-02 | Discharge: 2016-07-02 | Disposition: A | Discharge status: Home / Self Care | Payer: BLUE CROSS/BLUE SHIELD | Source: Ambulatory Visit | Attending: Internal Medicine | Admitting: Internal Medicine

## 2016-07-02 ENCOUNTER — Other Ambulatory Visit: Payer: Self-pay | Admitting: Internal Medicine

## 2016-07-02 DIAGNOSIS — M79675 Pain in left toe(s): Secondary | ICD-10-CM

## 2016-09-17 ENCOUNTER — Ambulatory Visit (HOSPITAL_COMMUNITY)
Admission: RE | Admit: 2016-09-17 | Discharge: 2016-09-17 | Disposition: A | Discharge status: Home / Self Care | Payer: BLUE CROSS/BLUE SHIELD | Source: Ambulatory Visit | Attending: Internal Medicine | Admitting: Internal Medicine

## 2016-10-02 ENCOUNTER — Other Ambulatory Visit: Payer: Self-pay | Admitting: Internal Medicine

## 2016-10-02 DIAGNOSIS — E0590 Thyrotoxicosis, unspecified without thyrotoxic crisis or storm: Secondary | ICD-10-CM

## 2016-10-02 DIAGNOSIS — E049 Nontoxic goiter, unspecified: Secondary | ICD-10-CM

## 2016-10-09 ENCOUNTER — Other Ambulatory Visit: Payer: Self-pay | Admitting: Family Medicine

## 2016-10-09 DIAGNOSIS — Z1231 Encounter for screening mammogram for malignant neoplasm of breast: Secondary | ICD-10-CM

## 2016-10-10 ENCOUNTER — Ambulatory Visit
Admission: RE | Admit: 2016-10-10 | Discharge: 2016-10-10 | Disposition: A | Discharge status: Home / Self Care | Payer: BLUE CROSS/BLUE SHIELD | Source: Ambulatory Visit | Attending: Internal Medicine | Admitting: Internal Medicine

## 2016-10-10 DIAGNOSIS — E0590 Thyrotoxicosis, unspecified without thyrotoxic crisis or storm: Secondary | ICD-10-CM

## 2016-10-10 DIAGNOSIS — E049 Nontoxic goiter, unspecified: Secondary | ICD-10-CM

## 2016-10-21 ENCOUNTER — Encounter (HOSPITAL_COMMUNITY): Payer: Self-pay

## 2016-10-21 ENCOUNTER — Ambulatory Visit (HOSPITAL_COMMUNITY)
Admission: RE | Admit: 2016-10-21 | Discharge: 2016-10-21 | Disposition: A | Discharge status: Home / Self Care | Payer: BLUE CROSS/BLUE SHIELD | Source: Ambulatory Visit | Attending: Internal Medicine | Admitting: Internal Medicine

## 2016-10-21 DIAGNOSIS — M810 Age-related osteoporosis without current pathological fracture: Secondary | ICD-10-CM | POA: Insufficient documentation

## 2016-10-21 MED ORDER — SODIUM CHLORIDE 0.9 % IV SOLN
Freq: Once | INTRAVENOUS | Status: AC
  Administered 2016-10-21: 12:00:00 via INTRAVENOUS

## 2016-10-21 MED ORDER — ZOLEDRONIC ACID 5 MG/100ML IV SOLN
5.0000 mg | Freq: Once | INTRAVENOUS | Status: AC
  Administered 2016-10-21: 5 mg via INTRAVENOUS
  Filled 2016-10-21: qty 100

## 2016-10-21 NOTE — Discharge Instructions (Signed)
Zoledronic Acid injection (Paget's Disease, Osteoporosis)/ Reclast °What is this medicine? °ZOLEDRONIC ACID (ZOE le dron ik AS id) lowers the amount of calcium loss from bone. It is used to treat Paget's disease and osteoporosis in women. °This medicine may be used for other purposes; ask your health care provider or pharmacist if you have questions. °COMMON BRAND NAME(S): Reclast, Zometa °What should I tell my health care provider before I take this medicine? °They need to know if you have any of these conditions: °-aspirin-sensitive asthma °-cancer, especially if you are receiving medicines used to treat cancer °-dental disease or wear dentures °-infection °-kidney disease °-low levels of calcium in the blood °-past surgery on the parathyroid gland or intestines °-receiving corticosteroids like dexamethasone or prednisone °-an unusual or allergic reaction to zoledronic acid, other medicines, foods, dyes, or preservatives °-pregnant or trying to get pregnant °-breast-feeding °How should I use this medicine? °This medicine is for infusion into a vein. It is given by a health care professional in a hospital or clinic setting. °Talk to your pediatrician regarding the use of this medicine in children. This medicine is not approved for use in children. °Overdosage: If you think you have taken too much of this medicine contact a poison control center or emergency room at once. °NOTE: This medicine is only for you. Do not share this medicine with others. °What if I miss a dose? °It is important not to miss your dose. Call your doctor or health care professional if you are unable to keep an appointment. °What may interact with this medicine? °-certain antibiotics given by injection °-NSAIDs, medicines for pain and inflammation, like ibuprofen or naproxen °-some diuretics like bumetanide, furosemide °-teriparatide °This list may not describe all possible interactions. Give your health care provider a list of all the  medicines, herbs, non-prescription drugs, or dietary supplements you use. Also tell them if you smoke, drink alcohol, or use illegal drugs. Some items may interact with your medicine. °What should I watch for while using this medicine? °Visit your doctor or health care professional for regular checkups. It may be some time before you see the benefit from this medicine. Do not stop taking your medicine unless your doctor tells you to. Your doctor may order blood tests or other tests to see how you are doing. °Women should inform their doctor if they wish to become pregnant or think they might be pregnant. There is a potential for serious side effects to an unborn child. Talk to your health care professional or pharmacist for more information. °You should make sure that you get enough calcium and vitamin D while you are taking this medicine. Discuss the foods you eat and the vitamins you take with your health care professional. °Some people who take this medicine have severe bone, joint, and/or muscle pain. This medicine may also increase your risk for jaw problems or a broken thigh bone. Tell your doctor right away if you have severe pain in your jaw, bones, joints, or muscles. Tell your doctor if you have any pain that does not go away or that gets worse. °Tell your dentist and dental surgeon that you are taking this medicine. You should not have major dental surgery while on this medicine. See your dentist to have a dental exam and fix any dental problems before starting this medicine. Take good care of your teeth while on this medicine. Make sure you see your dentist for regular follow-up appointments. °What side effects may I notice from receiving this   medicine? °Side effects that you should report to your doctor or health care professional as soon as possible: °-allergic reactions like skin rash, itching or hives, swelling of the face, lips, or tongue °-anxiety, confusion, or depression °-breathing  problems °-changes in vision °-eye pain °-feeling faint or lightheaded, falls °-jaw pain, especially after dental work °-mouth sores °-muscle cramps, stiffness, or weakness °-redness, blistering, peeling or loosening of the skin, including inside the mouth °-trouble passing urine or change in the amount of urine °Side effects that usually do not require medical attention (report to your doctor or health care professional if they continue or are bothersome): °-bone, joint, or muscle pain °-constipation °-diarrhea °-fever °-hair loss °-irritation at site where injected °-loss of appetite °-nausea, vomiting °-stomach upset °-trouble sleeping °-trouble swallowing °-weak or tired °This list may not describe all possible side effects. Call your doctor for medical advice about side effects. You may report side effects to FDA at 1-800-FDA-1088. °Where should I keep my medicine? °This drug is given in a hospital or clinic and will not be stored at home. °NOTE: This sheet is a summary. It may not cover all possible information. If you have questions about this medicine, talk to your doctor, pharmacist, or health care provider. °© 2018 Elsevier/Gold Standard (2013-10-23 14:19:57) ° °

## 2016-10-29 ENCOUNTER — Other Ambulatory Visit: Payer: Self-pay | Admitting: Family Medicine

## 2016-10-29 DIAGNOSIS — N644 Mastodynia: Secondary | ICD-10-CM

## 2016-11-01 ENCOUNTER — Ambulatory Visit
Admission: RE | Admit: 2016-11-01 | Discharge: 2016-11-01 | Disposition: A | Discharge status: Home / Self Care | Payer: BLUE CROSS/BLUE SHIELD | Source: Ambulatory Visit | Attending: Family Medicine | Admitting: Family Medicine

## 2016-11-01 DIAGNOSIS — N644 Mastodynia: Secondary | ICD-10-CM

## 2017-12-18 ENCOUNTER — Other Ambulatory Visit: Payer: Self-pay

## 2017-12-18 ENCOUNTER — Emergency Department (HOSPITAL_COMMUNITY): Payer: BLUE CROSS/BLUE SHIELD

## 2017-12-18 ENCOUNTER — Encounter (HOSPITAL_COMMUNITY): Payer: Self-pay

## 2017-12-18 ENCOUNTER — Emergency Department (HOSPITAL_COMMUNITY)
Admission: EM | Admit: 2017-12-18 | Discharge: 2017-12-19 | Disposition: A | Discharge status: Home / Self Care | Payer: BLUE CROSS/BLUE SHIELD | Attending: Emergency Medicine | Admitting: Emergency Medicine

## 2017-12-18 DIAGNOSIS — W010XXA Fall on same level from slipping, tripping and stumbling without subsequent striking against object, initial encounter: Secondary | ICD-10-CM | POA: Diagnosis not present

## 2017-12-18 DIAGNOSIS — Y929 Unspecified place or not applicable: Secondary | ICD-10-CM | POA: Insufficient documentation

## 2017-12-18 DIAGNOSIS — S42212A Unspecified displaced fracture of surgical neck of left humerus, initial encounter for closed fracture: Secondary | ICD-10-CM | POA: Diagnosis not present

## 2017-12-18 DIAGNOSIS — Z79899 Other long term (current) drug therapy: Secondary | ICD-10-CM | POA: Diagnosis not present

## 2017-12-18 DIAGNOSIS — Y939 Activity, unspecified: Secondary | ICD-10-CM | POA: Insufficient documentation

## 2017-12-18 DIAGNOSIS — S0990XA Unspecified injury of head, initial encounter: Secondary | ICD-10-CM | POA: Insufficient documentation

## 2017-12-18 DIAGNOSIS — S42215A Unspecified nondisplaced fracture of surgical neck of left humerus, initial encounter for closed fracture: Secondary | ICD-10-CM

## 2017-12-18 DIAGNOSIS — Y999 Unspecified external cause status: Secondary | ICD-10-CM | POA: Diagnosis not present

## 2017-12-18 DIAGNOSIS — S4992XA Unspecified injury of left shoulder and upper arm, initial encounter: Secondary | ICD-10-CM | POA: Diagnosis present

## 2017-12-18 MED ORDER — ONDANSETRON 4 MG PO TBDP: 4.0000 mg | ORAL_TABLET | Freq: Three times a day (TID) | ORAL | 0 refills | Status: DC | PRN

## 2017-12-18 MED ORDER — OXYCODONE HCL 5 MG PO TABS: 2.5000 mg | ORAL_TABLET | Freq: Four times a day (QID) | ORAL | 0 refills | Status: DC | PRN

## 2017-12-18 MED ORDER — DOCUSATE SODIUM 250 MG PO CAPS: 250.0000 mg | ORAL_CAPSULE | Freq: Every day | ORAL | 0 refills | Status: DC

## 2017-12-18 MED ORDER — ONDANSETRON 4 MG PO TBDP
4.0000 mg | ORAL_TABLET | Freq: Once | ORAL | Status: AC
  Administered 2017-12-18: 4 mg via ORAL
  Filled 2017-12-18: qty 1

## 2017-12-18 MED ORDER — FENTANYL CITRATE (PF) 100 MCG/2ML IJ SOLN
50.0000 ug | Freq: Once | INTRAMUSCULAR | Status: AC
  Administered 2017-12-18: 50 ug via INTRAVENOUS
  Filled 2017-12-18: qty 2

## 2017-12-18 MED ORDER — OXYCODONE-ACETAMINOPHEN 5-325 MG PO TABS
1.0000 | ORAL_TABLET | Freq: Once | ORAL | Status: AC
  Administered 2017-12-18: 1 via ORAL
  Filled 2017-12-18: qty 1

## 2017-12-18 NOTE — ED Notes (Signed)
Bed: [facility] Expected date:  Expected time:  Means of arrival:  Comments: fall 

## 2017-12-18 NOTE — ED Triage Notes (Signed)
Pt presents from home via EMS for fall. Pt tripped and fell, hitting her L shoulder. Pt also reports that she hit her head. PT AOx4. Pt given fentanyl by EMS.

## 2017-12-18 NOTE — ED Notes (Signed)
Pt able to ambulate in hallway. Sling placed on L arm. Pt tolerating well.

## 2017-12-18 NOTE — Discharge Instructions (Signed)
Take Tylenol extra strength 500 mg tablets --- 2 tablets every 6 hours for pain Take the Oxycodone as needed for severe pain Take the Zofran as needed for nausea Take colace while taking pain meds

## 2017-12-18 NOTE — ED Provider Notes (Signed)
[facility] DEPT Provider Note   CSN: [id number] Arrival date & time: 12/18/17  1949     History   Chief Complaint Chief Complaint  Patient presents with  . Fall  . Shoulder Injury    HPI Lynn Jenkins is a 72 y.o. female.  HPI 72 year old well-appearing female here with fall.  Patient states she was in her usual state of health until this afternoon.  She was walking to give her husband miles because it was not batteries, and she tripped on a computer cord.  She fell forward onto her face and left arm.  She reports immediate onset of severe, aching, throbbing, left shoulder pain.  The pain is worse with any kind of movement.  Denies sensation numbness or weakness.  She also hit her head and had a brief, resolved nosebleed.  She has some mild tenderness over her right face, but denies any loose dental issues.  Denies any difficulty swallowing patient able to breathe through and is not difficult.  She did not lose consciousness.  She does not feel like she has any neck pain.  No other complaints.  Pain mildly improved with fentanyl and rate  Past Medical History:  Diagnosis Date  . Anemia   . Arthritis   . GERD (gastroesophageal reflux disease)   . Headache    hx of migraines   . History of hiatal hernia   . Varicose veins     Patient Active Problem List   Diagnosis Date Noted  . Primary hyperparathyroidism ([place]) 11/01/2014  . Hyperparathyroidism, primary ([place]) 10/31/2014  . Varicose veins of lower extremities with other complications 58/30/9407    Past Surgical History:  Procedure Laterality Date  . ABDOMINAL HYSTERECTOMY    . APPENDECTOMY    . JOINT REPLACEMENT    . left foot surgery     . PARATHYROIDECTOMY N/A 11/01/2014   Procedure: NECK EXPLORATION AND PARATHYROIDECTOMY;  Surgeon: Armandina Gemma, MD;  Location: [facility] OR[facility];  Service: General;  Laterality: N/A;  . SHOULDER SURGERY       OB History   None      Home Medications    Prior  to Admission medications   Medication Sig Start Date End Date Taking? Authorizing Provider  acetaminophen (TYLENOL) 500 MG tablet Take 500-1,000 mg by mouth every 6 (six) hours as needed for mild pain or moderate pain.    [provider]  atorvastatin (LIPITOR) 40 MG tablet Take 40 mg by mouth daily.    [provider]  BIOTIN PO Take 1 capsule by mouth daily.     [provider]  cholecalciferol (VITAMIN D) 1000 UNITS tablet Take 1,000 Units by mouth daily.    [provider]  co-enzyme Q-10 30 MG capsule Take 30 mg by mouth daily.    [provider]  docusate sodium (COLACE) 250 MG capsule Take 1 capsule (250 mg total) by mouth daily. 12/18/17   [name], MD  dorzolamide (TRUSOPT) 2 % ophthalmic solution Place 1 drop into both eyes daily. 03/18/15   [provider]  esomeprazole (NEXIUM) 40 MG capsule Take 40 mg by mouth 2 (two) times daily before a meal.     [provider]  fenofibrate 160 MG tablet Take 160 mg by mouth daily.    [provider]  ferrous sulfate 325 (65 FE) MG tablet Take 325 mg by mouth daily with breakfast.    [provider]  Glucosamine-Chondroit-Vit C-Mn (GLUCOSAMINE 1500 COMPLEX) CAPS Take 1  capsule by mouth 2 (two) times daily.     [provider]  Eloise Levels, [name], (GREEN TEA PO) Take 1 capsule by mouth daily.    [provider]  HYDROcodone-acetaminophen (NORCO/VICODIN) 5-325 MG per tablet Take 1-2 tablets by mouth every 4 (four) hours as needed for moderate pain. 11/02/14   Armandina Gemma, MD  meloxicam (MOBIC) 15 MG tablet Take 15 mg by mouth at bedtime.    [provider]  metoCLOPramide (REGLAN) 10 MG tablet Take 10 mg by mouth 4 (four) times daily.    [provider]  Misc Natural Products (TOTAL MEMORY & FOCUS FORMULA PO) Take 2 tablets by mouth daily.    [provider]  Multiple Vitamin (MULTIVITAMIN WITH MINERALS) TABS  tablet Take 1 tablet by mouth daily.    [provider]  Omega 3 1000 MG CAPS Take 2,000 mg by mouth 2 (two) times daily.    [provider]  ondansetron (ZOFRAN ODT) 4 MG disintegrating tablet Take 1 tablet (4 mg total) by mouth every 8 (eight) hours as needed for nausea or vomiting. 12/18/17   [name], MD  oxyCODONE (ROXICODONE) 5 MG immediate release tablet Take 0.5-1 tablets (2.5-5 mg total) by mouth every 6 (six) hours as needed for moderate pain or severe pain. For moderate pain, take one half tablet. For severe pain, take one full tablet. 12/18/17   [name], MD  sertraline (ZOLOFT) 100 MG tablet Take 100 mg by mouth daily.    [provider]  vitamin B-12 (CYANOCOBALAMIN) 1000 MCG tablet Take 1,000 mcg by mouth daily.    [provider]  vitamin C (ASCORBIC ACID) 500 MG tablet Take 500 mg by mouth daily as needed (when sick).     [provider]  XALATAN 0.005 % ophthalmic solution Place 1 drop into both eyes at bedtime. 03/18/15   [provider]    Family History Family History  Problem Relation Age of Onset  . Heart disease Mother   . Hyperlipidemia Mother   . Hypertension Mother   . Heart disease Father   . Hyperlipidemia Father   . Hypertension Father   . Cancer Brother   . Hypertension Brother     Social History Social History   Tobacco Use  . Smoking status: Never Smoker  . Smokeless tobacco: Never Used  Substance Use Topics  . Alcohol use: No  . Drug use: No     Allergies   Morphine and related and Zomig [zolmitriptan]   Review of Systems Review of Systems  Constitutional: Negative for chills and fever.  HENT: Negative for congestion, rhinorrhea and sore throat.   Eyes: Negative for visual disturbance.  Respiratory: Negative for cough, shortness of breath and wheezing.   Cardiovascular: Negative for chest pain and leg swelling.  Gastrointestinal: Negative for abdominal pain, diarrhea,  nausea and vomiting.  Genitourinary: Negative for dysuria, flank pain, vaginal bleeding and vaginal discharge.  Musculoskeletal: Positive for arthralgias and myalgias. Negative for neck pain.  Skin: Negative for rash and wound.  Allergic/Immunologic: Negative for immunocompromised state.  Neurological: Positive for headaches. Negative for syncope, weakness and numbness.  Hematological: Does not bruise/bleed easily.  All other systems reviewed and are negative.    Physical Exam Updated Vital Signs BP (!) 163/103 (BP Location: Right Arm)   Pulse 64   Temp 97.8 F (36.6 C) (Oral)   Resp 18   Ht 5\' 5"  (1.651 m)   Wt 86.2 kg (190 lb)  SpO2 94%   BMI 31.62 kg/m   Physical Exam  Constitutional: She is oriented to person, place, and time. She appears well-developed and well-nourished. No distress.  HENT:  Head: Normocephalic and atraumatic.  Mouth/Throat: Oropharynx is clear and moist.  Mild edema to the right upper lip.  Lacerations.  No apparent tongue trauma.  Dried blood in the right nostril.  No apparent septal hematoma or deformity.  Midface stable.  Eyes: Conjunctivae are normal.  Neck: Neck supple.  Cardiovascular: Normal rate, regular rhythm and normal heart sounds.  Pulmonary/Chest: Effort normal. No respiratory distress. She has no wheezes.  Abdominal: She exhibits no distension.  Musculoskeletal: She exhibits no edema.  Neurological: She is alert and oriented to person, place, and time. She exhibits normal muscle tone.  Skin: Skin is warm. Capillary refill takes less than 2 seconds. No rash noted.  Nursing note and vitals reviewed.   UPPER EXTREMITY EXAM: LEFT  INSPECTION & PALPATION: Exquisite TTP left upper humerus. Exquisite pain with any pROM. No pain over elbow, wrist.  SENSORY: Sensation is intact to light touch in:  Superficial radial nerve distribution (dorsal first web space) Median nerve distribution (tip of index finger)   Ulnar nerve distribution (tip  of small finger)     MOTOR:  + Motor posterior interosseous nerve (thumb IP extension) + Anterior interosseous nerve (thumb IP flexion, index finger DIP flexion) + Radial nerve (wrist extension) + Median nerve (palpable firing thenar mass) + Ulnar nerve (palpable firing of first dorsal interosseous muscle)  VASCULAR: 2+ radial pulse Brisk capillary refill < 2 sec, fingers warm and well-perfused   ED Treatments / Results  Labs (all labs ordered are listed, but only abnormal results are displayed) Labs Reviewed - No data to display  EKG None  Radiology Dg Chest 2 View  Result Date: 12/18/2017 CLINICAL DATA:  Patient fell over computed cord. Left shoulder and upper humerus pain. EXAM: CHEST - 2 VIEW COMPARISON:  None. FINDINGS: Heart is borderline enlarged. There is mild aortic atherosclerosis. Pulmonary vascular congestion without pulmonary consolidation, effusion or pneumothorax is noted. And impacted surgical neck fracture of the left humerus is identified in keeping with a Neer category 1 part fracture. No joint dislocations are visualized. IMPRESSION: 1. Neer category 1 part fracture of the left surgical neck of the humerus with impaction. 2. Cardiomegaly with mild vascular congestion. Electronically Signed   By: Ashley Royalty M.D.   On: 12/18/2017 21:12   Ct Head Wo Contrast  Result Date: 12/18/2017 CLINICAL DATA:  Status post fall landing directly on face. EXAM: CT HEAD WITHOUT CONTRAST CT MAXILLOFACIAL WITHOUT CONTRAST CT CERVICAL SPINE WITHOUT CONTRAST TECHNIQUE: Multidetector CT imaging of the head, cervical spine, and maxillofacial structures were performed using the standard protocol without intravenous contrast. Multiplanar CT image reconstructions of the cervical spine and maxillofacial structures were also generated. COMPARISON:  None. FINDINGS: CT HEAD FINDINGS Brain: No evidence of acute infarction, hemorrhage, hydrocephalus, extra-axial collection or mass lesion/mass effect.  Vascular: Mild calcific atherosclerotic disease of the intra cavernous carotid arteries. Skull: Normal. Negative for fracture or focal lesion. Other: None. CT MAXILLOFACIAL FINDINGS Osseous: No fracture or mandibular dislocation. No destructive process. Orbits: Negative. No traumatic or inflammatory finding. Sinuses: Clear. Soft tissues: Negative. CT CERVICAL SPINE FINDINGS Alignment: Reversal of cervical lordosis. 3 mm anterolisthesis of C4 on C5, likely degenerative. Skull base and vertebrae: No acute fracture. No primary bone lesion or focal pathologic process. Soft tissues and spinal canal: No prevertebral fluid or swelling. No  visible canal hematoma. Disc levels: Advanced multilevel osteoarthritic changes, particularly severe at C4-C5 C5-C6 and C6-C7. Upper chest: Negative. Other: None. IMPRESSION: No acute intracranial abnormality. No evidence of facial fractures. No evidence of acute traumatic injury to the cervical spine. 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 Electronically Signed   By: Fidela Salisbury M.D.   On: 12/18/2017 21:02   Ct Cervical Spine Wo Contrast  Result Date: 12/18/2017 CLINICAL DATA:  Status post fall landing directly on face. EXAM: CT HEAD WITHOUT CONTRAST CT MAXILLOFACIAL WITHOUT CONTRAST CT CERVICAL SPINE WITHOUT CONTRAST TECHNIQUE: Multidetector CT imaging of the head, cervical spine, and maxillofacial structures were performed using the standard protocol without intravenous contrast. Multiplanar CT image reconstructions of the cervical spine and maxillofacial structures were also generated. COMPARISON:  None. FINDINGS: CT HEAD FINDINGS Brain: No evidence of acute infarction, hemorrhage, hydrocephalus, extra-axial collection or mass lesion/mass effect. Vascular: Mild calcific atherosclerotic disease of the intra cavernous carotid arteries. Skull: Normal. Negative for fracture or focal  lesion. Other: None. CT MAXILLOFACIAL FINDINGS Osseous: No fracture or mandibular dislocation. No destructive process. Orbits: Negative. No traumatic or inflammatory finding. Sinuses: Clear. Soft tissues: Negative. CT CERVICAL SPINE FINDINGS Alignment: Reversal of cervical lordosis. 3 mm anterolisthesis of C4 on C5, likely degenerative. Skull base and vertebrae: No acute fracture. No primary bone lesion or focal pathologic process. Soft tissues and spinal canal: No prevertebral fluid or swelling. No visible canal hematoma. Disc levels: Advanced multilevel osteoarthritic changes, particularly severe at C4-C5 C5-C6 and C6-C7. Upper chest: Negative. Other: None. IMPRESSION: No acute intracranial abnormality. No evidence of facial fractures. No evidence of acute traumatic injury to the cervical spine. 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 Electronically Signed   By: Fidela Salisbury M.D.   On: 12/18/2017 21:02   Dg Shoulder Left  Result Date: 12/18/2017 CLINICAL DATA:  Left shoulder pain after fall. EXAM: LEFT SHOULDER - 2+ VIEW COMPARISON:  None. FINDINGS: An acute, closed, surgical neck fracture slight impaction is noted without angulation greater than 45 degrees or displacement greater than 1 cm. [facility] and glenohumeral joints are maintained. There is cardiomegaly with vascular congestion. The adjacent ribs are nonacute. IMPRESSION: 1. Neer category 1 part fracture of the left surgical neck of the humerus with slight impaction. 2. Cardiomegaly with mild pulmonary vascular congestion. Electronically Signed   By: Ashley Royalty M.D.   On: 12/18/2017 21:14   Dg Humerus Left  Result Date: 12/18/2017 CLINICAL DATA:  Left shoulder pain after fall EXAM: LEFT HUMERUS - 2+ VIEW COMPARISON:  None. FINDINGS: An acute, closed, slightly impacted surgical neck fracture of the left humerus is noted without angulation greater than 45  degrees or displacement greater than 1 cm. No joint dislocations. IMPRESSION: Neer category 1 part fracture of the surgical neck of the humerus with impaction. Electronically Signed   By: Ashley Royalty M.D.   On: 12/18/2017 21:13   Ct Maxillofacial Wo Contrast  Result Date: 12/18/2017 CLINICAL DATA:  Status post fall landing directly on face. EXAM: CT HEAD WITHOUT CONTRAST CT MAXILLOFACIAL WITHOUT CONTRAST CT CERVICAL SPINE WITHOUT CONTRAST TECHNIQUE: Multidetector CT imaging of the head, cervical spine, and maxillofacial structures were performed using the standard protocol without intravenous contrast. Multiplanar CT image reconstructions of the cervical spine and maxillofacial structures were also generated. COMPARISON:  None. FINDINGS: CT HEAD FINDINGS Brain: No evidence  of acute infarction, hemorrhage, hydrocephalus, extra-axial collection or mass lesion/mass effect. Vascular: Mild calcific atherosclerotic disease of the intra cavernous carotid arteries. Skull: Normal. Negative for fracture or focal lesion. Other: None. CT MAXILLOFACIAL FINDINGS Osseous: No fracture or mandibular dislocation. No destructive process. Orbits: Negative. No traumatic or inflammatory finding. Sinuses: Clear. Soft tissues: Negative. CT CERVICAL SPINE FINDINGS Alignment: Reversal of cervical lordosis. 3 mm anterolisthesis of C4 on C5, likely degenerative. Skull base and vertebrae: No acute fracture. No primary bone lesion or focal pathologic process. Soft tissues and spinal canal: No prevertebral fluid or swelling. No visible canal hematoma. Disc levels: Advanced multilevel osteoarthritic changes, particularly severe at C4-C5 C5-C6 and C6-C7. Upper chest: Negative. Other: None. IMPRESSION: No acute intracranial abnormality. No evidence of facial fractures. No evidence of acute traumatic injury to the cervical spine. 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 Electronically Signed   By: Fidela Salisbury M.D.   On: 12/18/2017 21:02    Procedures Procedures (including critical care time)  Medications Ordered in ED Medications  fentaNYL (SUBLIMAZE) injection 50 mcg (50 mcg Intravenous Given 12/18/17 2101)  oxyCODONE-acetaminophen (PERCOCET/ROXICET) 5-325 MG per tablet 1 tablet (1 tablet Oral Given 12/18/17 2307)  ondansetron (ZOFRAN-ODT) disintegrating tablet 4 mg (4 mg Oral Given 12/18/17 2308)     Initial Impression / Assessment and Plan / ED Course  I have reviewed the triage vital signs and the nursing notes.  Pertinent labs & imaging results that were available during my care of the patient were reviewed by me and considered in my medical decision making (see chart for details).  Clinical Course as of Dec 18 2329  Thu Dec 19, 5938  6437 72 year old female here with fall and subsequent facial and left arm pain.  CT and plain films ordered.  Suspect possible humeral fracture.  Patient is on blood thinners with no loss consciousness.  Fentanyl for pain.   [CI]    Clinical Course User Index [CI] [name], MD    Imaging shows L humerus fx. D/w Dr. Berenice Primas - will place in sling, have her f/u in clinic early this coming week. Otherwise, CT negative. Pt ambulatory. Pain improving. Will give analgesia, antiemetics, d/c with good return precautions. She remains NVI. Family at bedside and is in agreement.  Final Clinical Impressions(s) / ED Diagnoses   Final diagnoses:  Closed nondisplaced fracture of surgical neck of left humerus, unspecified fracture morphology, initial encounter    ED Discharge Orders        Ordered    oxyCODONE (ROXICODONE) 5 MG immediate release tablet  Every 6 hours PRN     12/18/17 2326    docusate sodium (COLACE) 250 MG capsule  Daily     12/18/17 2328    ondansetron (ZOFRAN ODT) 4 MG disintegrating tablet  Every 8 hours PRN     12/18/17 2328       [name], MD 12/18/17 2331

## 2017-12-19 NOTE — ED Notes (Signed)
Discharge instructions reviewed with pt. Pt verbalized understanding. Pt to follow up with PCP and ortho. PIV removed. Pt brought to waiting room via wheelchair.

## 2018-02-16 DIAGNOSIS — Z23 Encounter for immunization: Secondary | ICD-10-CM | POA: Diagnosis not present

## 2018-02-17 DIAGNOSIS — M25612 Stiffness of left shoulder, not elsewhere classified: Secondary | ICD-10-CM | POA: Diagnosis not present

## 2018-02-17 DIAGNOSIS — S42202D Unspecified fracture of upper end of left humerus, subsequent encounter for fracture with routine healing: Secondary | ICD-10-CM | POA: Diagnosis not present

## 2018-02-19 DIAGNOSIS — M25572 Pain in left ankle and joints of left foot: Secondary | ICD-10-CM | POA: Diagnosis not present

## 2018-02-19 DIAGNOSIS — M25612 Stiffness of left shoulder, not elsewhere classified: Secondary | ICD-10-CM | POA: Diagnosis not present

## 2018-02-20 DIAGNOSIS — M25612 Stiffness of left shoulder, not elsewhere classified: Secondary | ICD-10-CM | POA: Diagnosis not present

## 2018-02-20 DIAGNOSIS — S42202D Unspecified fracture of upper end of left humerus, subsequent encounter for fracture with routine healing: Secondary | ICD-10-CM | POA: Diagnosis not present

## 2018-02-24 DIAGNOSIS — M25612 Stiffness of left shoulder, not elsewhere classified: Secondary | ICD-10-CM | POA: Diagnosis not present

## 2018-02-24 DIAGNOSIS — S42202D Unspecified fracture of upper end of left humerus, subsequent encounter for fracture with routine healing: Secondary | ICD-10-CM | POA: Diagnosis not present

## 2018-02-25 DIAGNOSIS — E210 Primary hyperparathyroidism: Secondary | ICD-10-CM | POA: Diagnosis not present

## 2018-02-25 DIAGNOSIS — K219 Gastro-esophageal reflux disease without esophagitis: Secondary | ICD-10-CM | POA: Diagnosis not present

## 2018-02-25 DIAGNOSIS — D509 Iron deficiency anemia, unspecified: Secondary | ICD-10-CM | POA: Diagnosis not present

## 2018-02-25 DIAGNOSIS — E782 Mixed hyperlipidemia: Secondary | ICD-10-CM | POA: Diagnosis not present

## 2018-02-26 DIAGNOSIS — M25612 Stiffness of left shoulder, not elsewhere classified: Secondary | ICD-10-CM | POA: Diagnosis not present

## 2018-02-26 DIAGNOSIS — S42202D Unspecified fracture of upper end of left humerus, subsequent encounter for fracture with routine healing: Secondary | ICD-10-CM | POA: Diagnosis not present

## 2018-03-03 DIAGNOSIS — M25612 Stiffness of left shoulder, not elsewhere classified: Secondary | ICD-10-CM | POA: Diagnosis not present

## 2018-03-03 DIAGNOSIS — S42202D Unspecified fracture of upper end of left humerus, subsequent encounter for fracture with routine healing: Secondary | ICD-10-CM | POA: Diagnosis not present

## 2018-03-16 DIAGNOSIS — M25612 Stiffness of left shoulder, not elsewhere classified: Secondary | ICD-10-CM | POA: Diagnosis not present

## 2018-03-16 DIAGNOSIS — S42202D Unspecified fracture of upper end of left humerus, subsequent encounter for fracture with routine healing: Secondary | ICD-10-CM | POA: Diagnosis not present

## 2018-04-02 DIAGNOSIS — M25612 Stiffness of left shoulder, not elsewhere classified: Secondary | ICD-10-CM | POA: Diagnosis not present

## 2018-04-02 DIAGNOSIS — S42202D Unspecified fracture of upper end of left humerus, subsequent encounter for fracture with routine healing: Secondary | ICD-10-CM | POA: Diagnosis not present

## 2018-04-29 DIAGNOSIS — J019 Acute sinusitis, unspecified: Secondary | ICD-10-CM | POA: Diagnosis not present

## 2018-05-12 DIAGNOSIS — H18413 Arcus senilis, bilateral: Secondary | ICD-10-CM | POA: Diagnosis not present

## 2018-05-12 DIAGNOSIS — H2513 Age-related nuclear cataract, bilateral: Secondary | ICD-10-CM | POA: Diagnosis not present

## 2018-05-12 DIAGNOSIS — H401133 Primary open-angle glaucoma, bilateral, severe stage: Secondary | ICD-10-CM | POA: Diagnosis not present

## 2018-05-12 DIAGNOSIS — H25013 Cortical age-related cataract, bilateral: Secondary | ICD-10-CM | POA: Diagnosis not present

## 2018-06-24 DIAGNOSIS — H25811 Combined forms of age-related cataract, right eye: Secondary | ICD-10-CM | POA: Insufficient documentation

## 2018-06-24 DIAGNOSIS — H401112 Primary open-angle glaucoma, right eye, moderate stage: Secondary | ICD-10-CM | POA: Diagnosis not present

## 2018-06-24 DIAGNOSIS — H25812 Combined forms of age-related cataract, left eye: Secondary | ICD-10-CM | POA: Diagnosis not present

## 2018-06-24 DIAGNOSIS — H401123 Primary open-angle glaucoma, left eye, severe stage: Secondary | ICD-10-CM | POA: Insufficient documentation

## 2018-07-15 DIAGNOSIS — H25813 Combined forms of age-related cataract, bilateral: Secondary | ICD-10-CM | POA: Diagnosis not present

## 2018-07-15 DIAGNOSIS — H401112 Primary open-angle glaucoma, right eye, moderate stage: Secondary | ICD-10-CM | POA: Diagnosis not present

## 2018-07-15 DIAGNOSIS — H401123 Primary open-angle glaucoma, left eye, severe stage: Secondary | ICD-10-CM | POA: Diagnosis not present

## 2018-08-26 DIAGNOSIS — E559 Vitamin D deficiency, unspecified: Secondary | ICD-10-CM | POA: Diagnosis not present

## 2018-08-26 DIAGNOSIS — E210 Primary hyperparathyroidism: Secondary | ICD-10-CM | POA: Diagnosis not present

## 2018-08-26 DIAGNOSIS — K219 Gastro-esophageal reflux disease without esophagitis: Secondary | ICD-10-CM | POA: Diagnosis not present

## 2018-09-17 ENCOUNTER — Telehealth: Payer: Self-pay | Admitting: *Deleted

## 2018-09-17 NOTE — Telephone Encounter (Signed)
Called patient and advised her that due to current COVID 19 pandemic, our office is severely reducing in person visits in order to minimize the risk to our patients and healthcare providers. We recommend to convert your appointment to a video visit. She stated she preferred to be seen in office. She rescheduled for June, verbalized understanding, appreciation.

## 2018-10-02 ENCOUNTER — Ambulatory Visit: Payer: Medicare Other | Admitting: Diagnostic Neuroimaging

## 2018-10-14 DIAGNOSIS — H401123 Primary open-angle glaucoma, left eye, severe stage: Secondary | ICD-10-CM | POA: Diagnosis not present

## 2018-10-14 DIAGNOSIS — H25813 Combined forms of age-related cataract, bilateral: Secondary | ICD-10-CM | POA: Diagnosis not present

## 2018-10-14 DIAGNOSIS — H401112 Primary open-angle glaucoma, right eye, moderate stage: Secondary | ICD-10-CM | POA: Diagnosis not present

## 2018-10-16 DIAGNOSIS — H401112 Primary open-angle glaucoma, right eye, moderate stage: Secondary | ICD-10-CM | POA: Diagnosis not present

## 2018-10-16 DIAGNOSIS — Z1159 Encounter for screening for other viral diseases: Secondary | ICD-10-CM | POA: Diagnosis not present

## 2018-10-16 DIAGNOSIS — Z01812 Encounter for preprocedural laboratory examination: Secondary | ICD-10-CM | POA: Diagnosis not present

## 2018-10-16 DIAGNOSIS — H25813 Combined forms of age-related cataract, bilateral: Secondary | ICD-10-CM | POA: Diagnosis not present

## 2018-10-16 DIAGNOSIS — H401123 Primary open-angle glaucoma, left eye, severe stage: Secondary | ICD-10-CM | POA: Diagnosis not present

## 2018-10-19 DIAGNOSIS — H25811 Combined forms of age-related cataract, right eye: Secondary | ICD-10-CM | POA: Diagnosis not present

## 2018-10-19 DIAGNOSIS — H25812 Combined forms of age-related cataract, left eye: Secondary | ICD-10-CM | POA: Diagnosis not present

## 2018-10-19 DIAGNOSIS — Z79899 Other long term (current) drug therapy: Secondary | ICD-10-CM | POA: Diagnosis not present

## 2018-10-19 DIAGNOSIS — K219 Gastro-esophageal reflux disease without esophagitis: Secondary | ICD-10-CM | POA: Diagnosis not present

## 2018-10-19 DIAGNOSIS — H4010X3 Unspecified open-angle glaucoma, severe stage: Secondary | ICD-10-CM | POA: Diagnosis not present

## 2018-10-19 DIAGNOSIS — M1990 Unspecified osteoarthritis, unspecified site: Secondary | ICD-10-CM | POA: Diagnosis not present

## 2018-10-19 DIAGNOSIS — H401112 Primary open-angle glaucoma, right eye, moderate stage: Secondary | ICD-10-CM | POA: Diagnosis not present

## 2018-10-19 DIAGNOSIS — H401123 Primary open-angle glaucoma, left eye, severe stage: Secondary | ICD-10-CM | POA: Diagnosis not present

## 2018-11-18 ENCOUNTER — Encounter: Payer: Self-pay | Admitting: *Deleted

## 2018-11-18 NOTE — Telephone Encounter (Signed)
LVM requesting call back to discuss her appt next Monday. Advised if she still wants to be seen in office her apt needs to be on Tues afternoon because of Covid 19 and limited number of patients being seen in office. I advised there is an opening June 23rd. Left office number for call back.

## 2018-11-19 ENCOUNTER — Encounter: Payer: Self-pay | Admitting: *Deleted

## 2018-11-19 NOTE — Addendum Note (Signed)
Addended by: Florian Buff C on: 11/19/2018 11:35 AM   Modules accepted: Orders

## 2018-11-19 NOTE — Telephone Encounter (Signed)
Spoke with patient and informed her that due to current COVID 19 pandemic, our office is still severely reducing in person visits in order to minimize the risk to our patients and healthcare providers. We recommend to convert your appointment to a video visit. We'll take all precautions to reduce any security or privacy concerns. This will be treated like an office visit, and we will file with your insurance. She consented to video visit, stated she has done [email or web address] for her husband. She asked for link to be texted to [phone].  We updated her EMR. I advised she call if she doesn't get text or link doesn't work. She verbalized understanding, appreciation. Text sent.

## 2018-11-23 ENCOUNTER — Other Ambulatory Visit: Payer: Self-pay

## 2018-11-23 ENCOUNTER — Ambulatory Visit: Payer: Medicare Other | Admitting: Diagnostic Neuroimaging

## 2018-12-11 DIAGNOSIS — S63501A Unspecified sprain of right wrist, initial encounter: Secondary | ICD-10-CM | POA: Diagnosis not present

## 2018-12-11 DIAGNOSIS — M25531 Pain in right wrist: Secondary | ICD-10-CM | POA: Diagnosis not present

## 2018-12-11 DIAGNOSIS — W19XXXA Unspecified fall, initial encounter: Secondary | ICD-10-CM | POA: Diagnosis not present

## 2019-01-29 DIAGNOSIS — H401123 Primary open-angle glaucoma, left eye, severe stage: Secondary | ICD-10-CM | POA: Diagnosis not present

## 2019-01-29 DIAGNOSIS — Z9842 Cataract extraction status, left eye: Secondary | ICD-10-CM | POA: Diagnosis not present

## 2019-01-29 DIAGNOSIS — H25811 Combined forms of age-related cataract, right eye: Secondary | ICD-10-CM | POA: Diagnosis not present

## 2019-01-29 DIAGNOSIS — H401112 Primary open-angle glaucoma, right eye, moderate stage: Secondary | ICD-10-CM | POA: Diagnosis not present

## 2019-02-08 DIAGNOSIS — J329 Chronic sinusitis, unspecified: Secondary | ICD-10-CM | POA: Diagnosis not present

## 2019-02-08 DIAGNOSIS — Z6835 Body mass index (BMI) 35.0-35.9, adult: Secondary | ICD-10-CM | POA: Diagnosis not present

## 2019-02-08 DIAGNOSIS — R112 Nausea with vomiting, unspecified: Secondary | ICD-10-CM | POA: Diagnosis not present

## 2019-02-09 DIAGNOSIS — R112 Nausea with vomiting, unspecified: Secondary | ICD-10-CM | POA: Diagnosis not present

## 2019-03-05 ENCOUNTER — Other Ambulatory Visit: Payer: Self-pay

## 2019-03-05 ENCOUNTER — Emergency Department (HOSPITAL_BASED_OUTPATIENT_CLINIC_OR_DEPARTMENT_OTHER)
Admission: EM | Admit: 2019-03-05 | Discharge: 2019-03-05 | Disposition: A | Discharge status: Home / Self Care | Payer: Medicare Other | Attending: Emergency Medicine | Admitting: Emergency Medicine

## 2019-03-05 ENCOUNTER — Emergency Department (HOSPITAL_BASED_OUTPATIENT_CLINIC_OR_DEPARTMENT_OTHER): Payer: Medicare Other

## 2019-03-05 ENCOUNTER — Encounter (HOSPITAL_BASED_OUTPATIENT_CLINIC_OR_DEPARTMENT_OTHER): Payer: Self-pay

## 2019-03-05 DIAGNOSIS — G4452 New daily persistent headache (NDPH): Secondary | ICD-10-CM | POA: Diagnosis not present

## 2019-03-05 DIAGNOSIS — D72829 Elevated white blood cell count, unspecified: Secondary | ICD-10-CM | POA: Insufficient documentation

## 2019-03-05 DIAGNOSIS — D509 Iron deficiency anemia, unspecified: Secondary | ICD-10-CM | POA: Diagnosis not present

## 2019-03-05 DIAGNOSIS — G43909 Migraine, unspecified, not intractable, without status migrainosus: Secondary | ICD-10-CM | POA: Diagnosis not present

## 2019-03-05 DIAGNOSIS — R51 Headache: Secondary | ICD-10-CM | POA: Insufficient documentation

## 2019-03-05 DIAGNOSIS — Z79899 Other long term (current) drug therapy: Secondary | ICD-10-CM | POA: Diagnosis not present

## 2019-03-05 DIAGNOSIS — R519 Headache, unspecified: Secondary | ICD-10-CM

## 2019-03-05 DIAGNOSIS — D649 Anemia, unspecified: Secondary | ICD-10-CM | POA: Diagnosis not present

## 2019-03-05 DIAGNOSIS — Z20828 Contact with and (suspected) exposure to other viral communicable diseases: Secondary | ICD-10-CM | POA: Insufficient documentation

## 2019-03-05 DIAGNOSIS — E210 Primary hyperparathyroidism: Secondary | ICD-10-CM | POA: Diagnosis not present

## 2019-03-05 LAB — CBC WITH DIFFERENTIAL/PLATELET
Abs Immature Granulocytes: 0.08 10*3/uL — ABNORMAL HIGH (ref 0.00–0.07)
Basophils Absolute: 0.1 10*3/uL (ref 0.0–0.1)
Basophils Relative: 0 %
Eosinophils Absolute: 0 10*3/uL (ref 0.0–0.5)
Eosinophils Relative: 0 %
HCT: 31.9 % — ABNORMAL LOW (ref 36.0–46.0)
Hemoglobin: 9.3 g/dL — ABNORMAL LOW (ref 12.0–15.0)
Immature Granulocytes: 0 %
Lymphocytes Relative: 10 %
Lymphs Abs: 2 10*3/uL (ref 0.7–4.0)
MCH: 22.7 pg — ABNORMAL LOW (ref 26.0–34.0)
MCHC: 29.2 g/dL — ABNORMAL LOW (ref 30.0–36.0)
MCV: 77.8 fL — ABNORMAL LOW (ref 80.0–100.0)
Monocytes Absolute: 1 10*3/uL (ref 0.1–1.0)
Monocytes Relative: 5 %
Neutro Abs: 16.4 10*3/uL — ABNORMAL HIGH (ref 1.7–7.7)
Neutrophils Relative %: 85 %
Platelets: 257 10*3/uL (ref 150–400)
RBC: 4.1 MIL/uL (ref 3.87–5.11)
RDW: 17.7 % — ABNORMAL HIGH (ref 11.5–15.5)
WBC: 19.5 10*3/uL — ABNORMAL HIGH (ref 4.0–10.5)
nRBC: 0 % (ref 0.0–0.2)

## 2019-03-05 LAB — COMPREHENSIVE METABOLIC PANEL
ALT: 19 U/L (ref 0–44)
AST: 21 U/L (ref 15–41)
Albumin: 3.6 g/dL (ref 3.5–5.0)
Alkaline Phosphatase: 82 U/L (ref 38–126)
Anion gap: 9 (ref 5–15)
BUN: 19 mg/dL (ref 8–23)
CO2: 25 mmol/L (ref 22–32)
Calcium: 11.3 mg/dL — ABNORMAL HIGH (ref 8.9–10.3)
Chloride: 104 mmol/L (ref 98–111)
Creatinine, Ser: 0.9 mg/dL (ref 0.44–1.00)
GFR calc Af Amer: >60 mL/min (ref >60)
GFR calc non Af Amer: >60 mL/min (ref >60)
Glucose, Bld: 108 mg/dL — ABNORMAL HIGH (ref 70–99)
Potassium: 3.7 mmol/L (ref 3.5–5.1)
Sodium: 138 mmol/L (ref 135–145)
Total Bilirubin: 0.4 mg/dL (ref 0.3–1.2)
Total Protein: 6.9 g/dL (ref 6.5–8.1)

## 2019-03-05 MED ORDER — METOCLOPRAMIDE HCL 5 MG/ML IJ SOLN
10.0000 mg | Freq: Once | INTRAMUSCULAR | Status: AC
  Administered 2019-03-05: 21:00:00 10 mg via INTRAMUSCULAR
  Filled 2019-03-05: qty 2

## 2019-03-05 MED ORDER — ACETAMINOPHEN 325 MG PO TABS
650.0000 mg | ORAL_TABLET | Freq: Once | ORAL | Status: AC
  Administered 2019-03-05: 20:00:00 650 mg via ORAL
  Filled 2019-03-05: qty 2

## 2019-03-05 NOTE — ED Triage Notes (Signed)
Pt c/o migraine day 2-states her medication makes HA worse-also c/o n/v/d which is her baseline-denies fever, flu like sx-NAD-steady gait
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
## 2019-03-05 NOTE — ED Notes (Signed)
PT c/o " typical migraine".
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
## 2019-03-07 LAB — NOVEL CORONAVIRUS, NAA (HOSP ORDER, SEND-OUT TO REF LAB; TAT 18-24 HRS): SARS-CoV-2, NAA: NOT DETECTED

## 2019-03-09 DIAGNOSIS — D649 Anemia, unspecified: Secondary | ICD-10-CM | POA: Diagnosis not present

## 2019-03-09 DIAGNOSIS — D72829 Elevated white blood cell count, unspecified: Secondary | ICD-10-CM | POA: Diagnosis not present

## 2019-03-09 DIAGNOSIS — R110 Nausea: Secondary | ICD-10-CM | POA: Diagnosis not present

## 2019-03-10 DIAGNOSIS — D72829 Elevated white blood cell count, unspecified: Secondary | ICD-10-CM | POA: Diagnosis not present

## 2019-03-10 DIAGNOSIS — D649 Anemia, unspecified: Secondary | ICD-10-CM | POA: Diagnosis not present

## 2019-03-17 DIAGNOSIS — H401112 Primary open-angle glaucoma, right eye, moderate stage: Secondary | ICD-10-CM | POA: Diagnosis not present

## 2019-03-17 DIAGNOSIS — E782 Mixed hyperlipidemia: Secondary | ICD-10-CM | POA: Diagnosis not present

## 2019-03-17 DIAGNOSIS — H35352 Cystoid macular degeneration, left eye: Secondary | ICD-10-CM | POA: Diagnosis not present

## 2019-03-17 DIAGNOSIS — Z23 Encounter for immunization: Secondary | ICD-10-CM | POA: Diagnosis not present

## 2019-03-17 DIAGNOSIS — G43009 Migraine without aura, not intractable, without status migrainosus: Secondary | ICD-10-CM | POA: Diagnosis not present

## 2019-03-17 DIAGNOSIS — H401123 Primary open-angle glaucoma, left eye, severe stage: Secondary | ICD-10-CM | POA: Diagnosis not present

## 2019-03-17 DIAGNOSIS — H25811 Combined forms of age-related cataract, right eye: Secondary | ICD-10-CM | POA: Diagnosis not present

## 2019-03-17 DIAGNOSIS — Z0000 Encounter for general adult medical examination without abnormal findings: Secondary | ICD-10-CM | POA: Diagnosis not present

## 2019-03-30 DIAGNOSIS — R140 Abdominal distension (gaseous): Secondary | ICD-10-CM | POA: Diagnosis not present

## 2019-04-01 ENCOUNTER — Other Ambulatory Visit: Payer: Self-pay | Admitting: Gastroenterology

## 2019-04-01 ENCOUNTER — Other Ambulatory Visit (HOSPITAL_COMMUNITY): Payer: Self-pay | Admitting: Gastroenterology

## 2019-04-01 DIAGNOSIS — M79643 Pain in unspecified hand: Secondary | ICD-10-CM | POA: Diagnosis not present

## 2019-04-01 DIAGNOSIS — M19041 Primary osteoarthritis, right hand: Secondary | ICD-10-CM | POA: Diagnosis not present

## 2019-04-01 DIAGNOSIS — M79641 Pain in right hand: Secondary | ICD-10-CM | POA: Diagnosis not present

## 2019-04-01 DIAGNOSIS — M25572 Pain in left ankle and joints of left foot: Secondary | ICD-10-CM | POA: Diagnosis not present

## 2019-04-01 DIAGNOSIS — R768 Other specified abnormal immunological findings in serum: Secondary | ICD-10-CM | POA: Diagnosis not present

## 2019-04-01 DIAGNOSIS — M545 Low back pain: Secondary | ICD-10-CM | POA: Diagnosis not present

## 2019-04-01 DIAGNOSIS — M79642 Pain in left hand: Secondary | ICD-10-CM | POA: Diagnosis not present

## 2019-04-01 DIAGNOSIS — M79672 Pain in left foot: Secondary | ICD-10-CM | POA: Diagnosis not present

## 2019-04-01 DIAGNOSIS — M1712 Unilateral primary osteoarthritis, left knee: Secondary | ICD-10-CM | POA: Diagnosis not present

## 2019-04-01 DIAGNOSIS — R140 Abdominal distension (gaseous): Secondary | ICD-10-CM

## 2019-04-01 DIAGNOSIS — M79671 Pain in right foot: Secondary | ICD-10-CM | POA: Diagnosis not present

## 2019-04-01 DIAGNOSIS — M064 Inflammatory polyarthropathy: Secondary | ICD-10-CM | POA: Diagnosis not present

## 2019-04-01 DIAGNOSIS — M359 Systemic involvement of connective tissue, unspecified: Secondary | ICD-10-CM | POA: Diagnosis not present

## 2019-04-01 DIAGNOSIS — M1812 Unilateral primary osteoarthritis of first carpometacarpal joint, left hand: Secondary | ICD-10-CM | POA: Diagnosis not present

## 2019-04-01 DIAGNOSIS — M1711 Unilateral primary osteoarthritis, right knee: Secondary | ICD-10-CM | POA: Diagnosis not present

## 2019-04-01 DIAGNOSIS — M19071 Primary osteoarthritis, right ankle and foot: Secondary | ICD-10-CM | POA: Diagnosis not present

## 2019-04-07 ENCOUNTER — Other Ambulatory Visit: Payer: Self-pay | Admitting: Gastroenterology

## 2019-04-07 DIAGNOSIS — R140 Abdominal distension (gaseous): Secondary | ICD-10-CM

## 2019-04-14 ENCOUNTER — Ambulatory Visit (HOSPITAL_COMMUNITY)
Admission: RE | Admit: 2019-04-14 | Discharge: 2019-04-14 | Disposition: A | Discharge status: Home / Self Care | Payer: Medicare Other | Source: Ambulatory Visit | Attending: Gastroenterology | Admitting: Gastroenterology

## 2019-04-14 ENCOUNTER — Other Ambulatory Visit: Payer: Self-pay

## 2019-04-14 DIAGNOSIS — R140 Abdominal distension (gaseous): Secondary | ICD-10-CM | POA: Diagnosis not present

## 2019-04-14 MED ORDER — TECHNETIUM TC 99M SULFUR COLLOID
2.0500 | Freq: Once | INTRAVENOUS | Status: AC | PRN
  Administered 2019-04-14: 08:00:00 2.05 via ORAL

## 2019-04-19 DIAGNOSIS — M064 Inflammatory polyarthropathy: Secondary | ICD-10-CM | POA: Diagnosis not present

## 2019-04-19 DIAGNOSIS — M359 Systemic involvement of connective tissue, unspecified: Secondary | ICD-10-CM | POA: Diagnosis not present

## 2019-04-19 DIAGNOSIS — M79643 Pain in unspecified hand: Secondary | ICD-10-CM | POA: Diagnosis not present

## 2019-04-19 DIAGNOSIS — R768 Other specified abnormal immunological findings in serum: Secondary | ICD-10-CM | POA: Diagnosis not present

## 2019-04-23 DIAGNOSIS — H18519 Endothelial corneal dystrophy, unspecified eye: Secondary | ICD-10-CM | POA: Insufficient documentation

## 2019-04-23 DIAGNOSIS — H401112 Primary open-angle glaucoma, right eye, moderate stage: Secondary | ICD-10-CM | POA: Diagnosis not present

## 2019-04-23 DIAGNOSIS — H401123 Primary open-angle glaucoma, left eye, severe stage: Secondary | ICD-10-CM | POA: Diagnosis not present

## 2019-04-23 DIAGNOSIS — H35352 Cystoid macular degeneration, left eye: Secondary | ICD-10-CM | POA: Diagnosis not present

## 2019-05-05 ENCOUNTER — Other Ambulatory Visit: Payer: Self-pay | Admitting: Internal Medicine

## 2019-05-05 DIAGNOSIS — M8580 Other specified disorders of bone density and structure, unspecified site: Secondary | ICD-10-CM

## 2019-05-05 DIAGNOSIS — E210 Primary hyperparathyroidism: Secondary | ICD-10-CM

## 2019-05-05 DIAGNOSIS — E042 Nontoxic multinodular goiter: Secondary | ICD-10-CM | POA: Diagnosis not present

## 2019-05-05 DIAGNOSIS — K219 Gastro-esophageal reflux disease without esophagitis: Secondary | ICD-10-CM | POA: Diagnosis not present

## 2019-05-12 DIAGNOSIS — H401132 Primary open-angle glaucoma, bilateral, moderate stage: Secondary | ICD-10-CM | POA: Diagnosis not present

## 2019-05-12 DIAGNOSIS — H59039 Cystoid macular edema following cataract surgery, unspecified eye: Secondary | ICD-10-CM | POA: Diagnosis not present

## 2019-05-12 DIAGNOSIS — Z961 Presence of intraocular lens: Secondary | ICD-10-CM | POA: Diagnosis not present

## 2019-05-12 DIAGNOSIS — H2511 Age-related nuclear cataract, right eye: Secondary | ICD-10-CM | POA: Diagnosis not present

## 2019-05-17 ENCOUNTER — Other Ambulatory Visit (HOSPITAL_COMMUNITY): Payer: Self-pay | Admitting: *Deleted

## 2019-05-18 ENCOUNTER — Encounter (HOSPITAL_COMMUNITY): Payer: Medicare Other

## 2019-05-24 ENCOUNTER — Ambulatory Visit (HOSPITAL_COMMUNITY)
Admission: RE | Admit: 2019-05-24 | Discharge: 2019-05-24 | Disposition: A | Discharge status: Home / Self Care | Payer: Medicare Other | Source: Ambulatory Visit | Attending: Internal Medicine | Admitting: Internal Medicine

## 2019-05-24 ENCOUNTER — Other Ambulatory Visit: Payer: Self-pay

## 2019-05-24 DIAGNOSIS — M810 Age-related osteoporosis without current pathological fracture: Secondary | ICD-10-CM | POA: Diagnosis not present

## 2019-05-24 MED ORDER — ZOLEDRONIC ACID 5 MG/100ML IV SOLN
INTRAVENOUS | Status: AC
  Filled 2019-05-24: qty 100

## 2019-05-24 MED ORDER — ZOLEDRONIC ACID 5 MG/100ML IV SOLN
5.0000 mg | Freq: Once | INTRAVENOUS | Status: AC
Start: 2019-05-24 — End: 2019-05-24
  Administered 2019-05-24: 5 mg via INTRAVENOUS

## 2019-05-25 ENCOUNTER — Ambulatory Visit
Admission: RE | Admit: 2019-05-25 | Discharge: 2019-05-25 | Disposition: A | Discharge status: Home / Self Care | Payer: Medicare Other | Source: Ambulatory Visit | Attending: Internal Medicine | Admitting: Internal Medicine

## 2019-05-25 DIAGNOSIS — E042 Nontoxic multinodular goiter: Secondary | ICD-10-CM

## 2019-05-25 DIAGNOSIS — E041 Nontoxic single thyroid nodule: Secondary | ICD-10-CM | POA: Diagnosis not present

## 2019-05-25 DIAGNOSIS — E210 Primary hyperparathyroidism: Secondary | ICD-10-CM

## 2019-06-08 DIAGNOSIS — R112 Nausea with vomiting, unspecified: Secondary | ICD-10-CM | POA: Diagnosis not present

## 2019-06-08 DIAGNOSIS — G43009 Migraine without aura, not intractable, without status migrainosus: Secondary | ICD-10-CM | POA: Diagnosis not present

## 2019-06-17 DIAGNOSIS — H401132 Primary open-angle glaucoma, bilateral, moderate stage: Secondary | ICD-10-CM | POA: Diagnosis not present

## 2019-06-17 DIAGNOSIS — H2511 Age-related nuclear cataract, right eye: Secondary | ICD-10-CM | POA: Diagnosis not present

## 2019-06-17 DIAGNOSIS — H59039 Cystoid macular edema following cataract surgery, unspecified eye: Secondary | ICD-10-CM | POA: Diagnosis not present

## 2019-06-17 DIAGNOSIS — Z961 Presence of intraocular lens: Secondary | ICD-10-CM | POA: Diagnosis not present

## 2019-06-21 DIAGNOSIS — M064 Inflammatory polyarthropathy: Secondary | ICD-10-CM | POA: Diagnosis not present

## 2019-06-21 DIAGNOSIS — M79643 Pain in unspecified hand: Secondary | ICD-10-CM | POA: Diagnosis not present

## 2019-06-21 DIAGNOSIS — Z79899 Other long term (current) drug therapy: Secondary | ICD-10-CM | POA: Diagnosis not present

## 2019-06-21 DIAGNOSIS — R768 Other specified abnormal immunological findings in serum: Secondary | ICD-10-CM | POA: Diagnosis not present

## 2019-06-21 DIAGNOSIS — M359 Systemic involvement of connective tissue, unspecified: Secondary | ICD-10-CM | POA: Diagnosis not present

## 2019-06-23 DIAGNOSIS — H401123 Primary open-angle glaucoma, left eye, severe stage: Secondary | ICD-10-CM | POA: Diagnosis not present

## 2019-06-23 DIAGNOSIS — H209 Unspecified iridocyclitis: Secondary | ICD-10-CM | POA: Insufficient documentation

## 2019-06-23 DIAGNOSIS — H401112 Primary open-angle glaucoma, right eye, moderate stage: Secondary | ICD-10-CM | POA: Diagnosis not present

## 2019-06-23 DIAGNOSIS — H59039 Cystoid macular edema following cataract surgery, unspecified eye: Secondary | ICD-10-CM | POA: Diagnosis not present

## 2019-06-23 DIAGNOSIS — H35352 Cystoid macular degeneration, left eye: Secondary | ICD-10-CM | POA: Diagnosis not present

## 2019-06-29 DIAGNOSIS — M064 Inflammatory polyarthropathy: Secondary | ICD-10-CM | POA: Diagnosis not present

## 2019-06-29 DIAGNOSIS — M359 Systemic involvement of connective tissue, unspecified: Secondary | ICD-10-CM | POA: Diagnosis not present

## 2019-07-19 DIAGNOSIS — M359 Systemic involvement of connective tissue, unspecified: Secondary | ICD-10-CM | POA: Diagnosis not present

## 2019-07-19 DIAGNOSIS — R768 Other specified abnormal immunological findings in serum: Secondary | ICD-10-CM | POA: Diagnosis not present

## 2019-07-19 DIAGNOSIS — M064 Inflammatory polyarthropathy: Secondary | ICD-10-CM | POA: Diagnosis not present

## 2019-07-19 DIAGNOSIS — M79643 Pain in unspecified hand: Secondary | ICD-10-CM | POA: Diagnosis not present

## 2019-07-26 DIAGNOSIS — M47817 Spondylosis without myelopathy or radiculopathy, lumbosacral region: Secondary | ICD-10-CM | POA: Diagnosis not present

## 2019-07-30 ENCOUNTER — Other Ambulatory Visit: Payer: Medicare Other

## 2019-08-02 DIAGNOSIS — M064 Inflammatory polyarthropathy: Secondary | ICD-10-CM | POA: Diagnosis not present

## 2019-08-03 ENCOUNTER — Encounter (HOSPITAL_COMMUNITY): Payer: Self-pay

## 2019-08-03 ENCOUNTER — Emergency Department (HOSPITAL_COMMUNITY)
Admission: EM | Admit: 2019-08-03 | Discharge: 2019-08-04 | Disposition: A | Discharge status: Home / Self Care | Payer: Medicare Other | Attending: Emergency Medicine | Admitting: Emergency Medicine

## 2019-08-03 ENCOUNTER — Other Ambulatory Visit: Payer: Self-pay

## 2019-08-03 DIAGNOSIS — Z79899 Other long term (current) drug therapy: Secondary | ICD-10-CM | POA: Diagnosis not present

## 2019-08-03 DIAGNOSIS — R300 Dysuria: Secondary | ICD-10-CM | POA: Diagnosis not present

## 2019-08-03 DIAGNOSIS — Z8589 Personal history of malignant neoplasm of other organs and systems: Secondary | ICD-10-CM | POA: Diagnosis not present

## 2019-08-03 DIAGNOSIS — D72829 Elevated white blood cell count, unspecified: Secondary | ICD-10-CM | POA: Insufficient documentation

## 2019-08-03 DIAGNOSIS — R799 Abnormal finding of blood chemistry, unspecified: Secondary | ICD-10-CM | POA: Diagnosis present

## 2019-08-03 DIAGNOSIS — F17210 Nicotine dependence, cigarettes, uncomplicated: Secondary | ICD-10-CM | POA: Diagnosis not present

## 2019-08-03 DIAGNOSIS — R350 Frequency of micturition: Secondary | ICD-10-CM | POA: Diagnosis not present

## 2019-08-03 DIAGNOSIS — D649 Anemia, unspecified: Secondary | ICD-10-CM | POA: Diagnosis not present

## 2019-08-03 NOTE — ED Triage Notes (Signed)
Pt was sent from her Dr's office for further evaluation of her lab work, she states she was tod that her white count was elevated and her hemoglobin was low

## 2019-08-04 DIAGNOSIS — D72829 Elevated white blood cell count, unspecified: Secondary | ICD-10-CM | POA: Diagnosis not present

## 2019-08-04 LAB — CBC WITH DIFFERENTIAL/PLATELET
Abs Immature Granulocytes: 0.29 10*3/uL — ABNORMAL HIGH (ref 0.00–0.07)
Basophils Absolute: 0.1 10*3/uL (ref 0.0–0.1)
Basophils Relative: 0 %
Eosinophils Absolute: 0.2 10*3/uL (ref 0.0–0.5)
Eosinophils Relative: 1 %
HCT: 33.4 % — ABNORMAL LOW (ref 36.0–46.0)
Hemoglobin: 9.5 g/dL — ABNORMAL LOW (ref 12.0–15.0)
Immature Granulocytes: 2 %
Lymphocytes Relative: 10 %
Lymphs Abs: 1.7 10*3/uL (ref 0.7–4.0)
MCH: 22.8 pg — ABNORMAL LOW (ref 26.0–34.0)
MCHC: 28.4 g/dL — ABNORMAL LOW (ref 30.0–36.0)
MCV: 80.3 fL (ref 80.0–100.0)
Monocytes Absolute: 0.8 10*3/uL (ref 0.1–1.0)
Monocytes Relative: 5 %
Neutro Abs: 14.2 10*3/uL — ABNORMAL HIGH (ref 1.7–7.7)
Neutrophils Relative %: 82 %
Platelets: 322 10*3/uL (ref 150–400)
RBC: 4.16 MIL/uL (ref 3.87–5.11)
RDW: 20.7 % — ABNORMAL HIGH (ref 11.5–15.5)
WBC: 17.2 10*3/uL — ABNORMAL HIGH (ref 4.0–10.5)
nRBC: 0 % (ref 0.0–0.2)

## 2019-08-04 LAB — BASIC METABOLIC PANEL
Anion gap: 8 (ref 5–15)
BUN: 33 mg/dL — ABNORMAL HIGH (ref 8–23)
CO2: 25 mmol/L (ref 22–32)
Calcium: 11.7 mg/dL — ABNORMAL HIGH (ref 8.9–10.3)
Chloride: 108 mmol/L (ref 98–111)
Creatinine, Ser: 1.35 mg/dL — ABNORMAL HIGH (ref 0.44–1.00)
GFR calc Af Amer: 45 mL/min — ABNORMAL LOW (ref >60)
GFR calc non Af Amer: 39 mL/min — ABNORMAL LOW (ref >60)
Glucose, Bld: 120 mg/dL — ABNORMAL HIGH (ref 70–99)
Potassium: 4.2 mmol/L (ref 3.5–5.1)
Sodium: 141 mmol/L (ref 135–145)

## 2019-08-04 NOTE — ED Notes (Signed)
Full set of labs sent to lab, including pink top.

## 2019-08-04 NOTE — ED Notes (Signed)
Patient was verbalized discharge instructions. Pt had no further questions at this time. NAD. 

## 2019-08-04 NOTE — ED Provider Notes (Signed)
[name] Provider Note   CSN: [id number] Arrival date & time: 08/03/19  2231     History Chief Complaint  Patient presents with  . abnormal labs    Lynn Jenkins is a 74 y.o. female.  The history is provided by the patient.  Patient presents for evaluation of abnormal labs.  Patient reports she was seen by her rheumatologist this week who did outpatient labs.  She was called and informed that her white blood cell count was 29.7, hemoglobin was 9.7.  She is referred to her PCP for televisit, who instructed her to go to ER.  Patient reports ongoing symptoms of fatigue and lightheadedness.  She has also had recent nausea vomiting but not new today.  No fever.  No new chest pain.  She is still working and active.  No bloody or black stool.  Her course is stable     Past Medical History:  Diagnosis Date  . Anemia   . Arthritis   . Generalized anxiety disorder   . GERD (gastroesophageal reflux disease)   . Glaucoma   . Headache    hx of migraines   . History of hiatal hernia   . Hyperthyroidism   . Migraine   . Mixed hyperlipidemia   . Multinodular goiter   . Osteopenia   . Primary hyperparathyroidism ([name])   . Varicose veins   . Vitamin D deficiency     Patient Active Problem List   Diagnosis Date Noted  . Primary hyperparathyroidism ([name]) 11/01/2014  . Hyperparathyroidism, primary ([name]) 10/31/2014  . Varicose veins of lower extremities with other complications [id number]    Past Surgical History:  Procedure Laterality Date  . ABDOMINAL HYSTERECTOMY    . APPENDECTOMY    . JOINT REPLACEMENT    . left foot surgery     . PARATHYROIDECTOMY N/A 11/01/2014   Procedure: NECK EXPLORATION AND PARATHYROIDECTOMY;  Surgeon: Armandina Gemma, MD;  Location: [facility];  Service: General;  Laterality: N/A;  . SHOULDER SURGERY       OB History   No obstetric history on file.     Family History  Problem Relation Age of Onset  . Heart disease  Mother   . Hyperlipidemia Mother   . Hypertension Mother   . Heart disease Father   . Hyperlipidemia Father   . Hypertension Father   . Cancer Brother   . Hypertension Brother     Social History   Tobacco Use  . Smoking status: Never Smoker  . Smokeless tobacco: Never Used  Substance Use Topics  . Alcohol use: No  . Drug use: No    Home Medications Prior to Admission medications   Medication Sig Start Date End Date Taking? Authorizing Provider  acetaminophen (TYLENOL) 500 MG tablet Take 500-1,000 mg by mouth every 6 (six) hours as needed for mild pain or moderate pain.   Yes [provider]  BIOTIN PO Take 1 capsule by mouth daily.    Yes [provider]  brimonidine (ALPHAGAN) 0.2 % ophthalmic solution Place 1 drop into the left eye daily.   Yes [provider]  Bromfenac Sodium (PROLENSA) 0.07 % SOLN Place 1 drop into the left eye daily.   Yes [provider]  cholecalciferol (VITAMIN D) 1000 UNITS tablet Take 1,000 Units by mouth daily.   Yes [provider]  co-enzyme Q-10 30 MG capsule Take 30 mg by mouth daily.   Yes [provider]  Difluprednate (DUREZOL) 0.05 %  EMUL Place 1 drop into the left eye in the morning, at noon, and at bedtime.   Yes [provider]  esomeprazole (NEXIUM) 40 MG capsule Take 40 mg by mouth 2 (two) times daily before a meal.    Yes [provider]  fenofibrate 160 MG tablet Take 160 mg by mouth daily.   Yes [provider]  ferrous sulfate 325 (65 FE) MG tablet Take 325 mg by mouth daily with breakfast.   Yes [provider]  folic acid (FOLVITE) 1 MG tablet Take 1 mg by mouth daily.   Yes [provider]  furosemide (LASIX) 20 MG tablet Take 20 mg by mouth daily as needed for fluid.  08/26/18  Yes [provider]  Glucosamine-Chondroit-Vit C-Mn (GLUCOSAMINE 1500 COMPLEX) CAPS Take 1 capsule by mouth 2 (two) times daily.    Yes [provider]  Eloise Levels, [name], (GREEN TEA PO) Take 1 capsule by mouth daily.   Yes [provider]  hydroxychloroquine (PLAQUENIL) 200 MG tablet Take 400 mg by mouth daily.   Yes [provider]  meloxicam (MOBIC) 15 MG tablet Take 15 mg by mouth at bedtime.   Yes [provider]  metoCLOPramide (REGLAN) 10 MG tablet Take 10 mg by mouth 4 (four) times daily.   Yes [provider]  Misc Natural Products (TOTAL MEMORY & FOCUS FORMULA PO) Take 2 tablets by mouth daily.   Yes [provider]  Multiple Vitamin (MULTIVITAMIN WITH MINERALS) TABS tablet Take 1 tablet by mouth daily.   Yes [provider]  Netarsudil Dimesylate (RHOPRESSA) 0.02 % SOLN Place 1 drop into the left eye at bedtime.   Yes [provider]  Omega 3 1000 MG CAPS Take 2,000 mg by mouth 2 (two) times daily.   Yes [provider]  ondansetron (ZOFRAN ODT) 4 MG disintegrating tablet Take 1 tablet (4 mg total) by mouth every 8 (eight) hours as needed for nausea or vomiting. 12/18/17  Yes [name], MD  predniSONE (DELTASONE) 5 MG tablet Take 5 mg by mouth daily with breakfast.   Yes [provider]  promethazine (PHENERGAN) 25 MG tablet Take 25 mg by mouth every 6 (six) hours as needed for nausea or vomiting.   Yes [provider]  sertraline (ZOLOFT) 100 MG tablet Take 100 mg by mouth daily.   Yes [provider]  SUMAtriptan (IMITREX) 100 MG tablet 100 mg as needed. 1 tab at onset of headache, may reoeat  In 2 hrs, max of 2 pills in 24 hrs 08/26/18  Yes [provider]  vitamin B-12 (CYANOCOBALAMIN) 1000 MCG tablet Take 1,000 mcg by mouth daily.   Yes [provider]  vitamin C (ASCORBIC ACID) 500 MG tablet Take 500 mg by mouth daily as needed (when sick).    Yes [provider]  Vitamin D, Ergocalciferol, (DRISDOL) 1.25 MG (50000 UNIT) CAPS capsule Take 50,000 Units by mouth every 7 (seven)  days.   Yes [provider]  XALATAN 0.005 % ophthalmic solution Place 1 drop into both eyes at bedtime. 03/18/15  Yes [provider]  docusate sodium (COLACE) 250 MG capsule Take 1 capsule (250 mg total) by mouth daily. Patient not taking: Reported on 11/19/2018 12/18/17   [name], MD    Allergies    Morphine and related, Augmentin [amoxicillin-pot clavulanate], Ceftin [cefuroxime axetil], Doxycycline, Gabapentin, Sumatriptan, Zomig [zolmitriptan], Biaxin [clarithromycin], and Cefdinir  Review of Systems   Review of Systems  Constitutional: Positive for fatigue. Negative for fever.  Respiratory: Negative for shortness of breath.   Cardiovascular: Negative for chest pain.  Gastrointestinal: Positive for nausea. Negative for blood in stool.  Genitourinary: Negative for hematuria and vaginal bleeding.  All other systems reviewed and are negative.   Physical Exam Updated Vital Signs BP (!) 150/89 (BP Location: Right Arm)   Pulse 72   Temp 97.8 F (36.6 C) (Oral)   Resp 15   SpO2 98%   Physical Exam CONSTITUTIONAL: Well developed/well nourished HEAD: Normocephalic/atraumatic EYES: EOMI/PERRL, conjunctival pink ENMT: Mucous membranes moist NECK: supple no meningeal signs SPINE/BACK:entire spine nontender CV: S1/S2 noted, no murmurs/rubs/gallops noted LUNGS: Lungs are clear to auscultation bilaterally, no apparent distress ABDOMEN: soft, nontender, no rebound or guarding, bowel sounds noted throughout abdomen GU:no cva tenderness NEURO: Pt is awake/alert/appropriate, moves all extremitiesx4.  No facial droop.  No arm or leg drift EXTREMITIES:  full ROM SKIN: warm, color normal PSYCH: no abnormalities of mood noted, alert and oriented to situation  ED Results / Procedures / Treatments   Labs (all labs ordered are listed, but only abnormal results are displayed) Labs Reviewed  BASIC METABOLIC PANEL - Abnormal; Notable for the following components:       Result Value   Glucose, Bld 120 (*)    BUN 33 (*)    Creatinine, Ser 1.35 (*)    Calcium 11.7 (*)    GFR calc non Af Amer 39 (*)    GFR calc Af Amer 45 (*)    All other components within normal limits  CBC WITH DIFFERENTIAL/PLATELET - Abnormal; Notable for the following components:   WBC 17.2 (*)    Hemoglobin 9.5 (*)    HCT 33.4 (*)    MCH 22.8 (*)    MCHC 28.4 (*)    RDW 20.7 (*)    Neutro Abs 14.2 (*)    Abs Immature Granulocytes 0.29 (*)    All other components within normal limits    EKG None  Radiology No results found.  Procedures Procedures   Medications Ordered in ED Medications - No data to display  ED Course  I have reviewed the triage vital signs and the nursing notes.  Pertinent labs  results that were available during my care of the patient were reviewed by me and considered in my medical decision making (see chart for details).    MDM Rules/Calculators/A&P                      1:27 AM Patient with history of lupus presents for abnormal labs as an outpatient. Patient reports feeling fatigued and nauseous recently, but this is not new. She is still active and is working her job Labs are pending 2:26 AM Labs similar to prior She has mild dehydration but otherwise no abrupt change I feel she is appropriate for d/c home Pt agreeable and will f/u with PCP  Final Clinical Impression(s) / ED Diagnoses Final diagnoses:  Leukocytosis, unspecified type    Rx / DC Orders ED Discharge Orders    None       Ripley Fraise, MD 08/04/19 [phone]

## 2019-08-05 DIAGNOSIS — D72829 Elevated white blood cell count, unspecified: Secondary | ICD-10-CM | POA: Diagnosis not present

## 2019-08-05 DIAGNOSIS — H00012 Hordeolum externum right lower eyelid: Secondary | ICD-10-CM | POA: Diagnosis not present

## 2019-08-05 DIAGNOSIS — D649 Anemia, unspecified: Secondary | ICD-10-CM | POA: Diagnosis not present

## 2019-08-06 ENCOUNTER — Other Ambulatory Visit: Payer: Self-pay

## 2019-08-18 ENCOUNTER — Other Ambulatory Visit: Payer: Self-pay | Admitting: Hematology

## 2019-08-18 DIAGNOSIS — D509 Iron deficiency anemia, unspecified: Secondary | ICD-10-CM | POA: Insufficient documentation

## 2019-08-18 DIAGNOSIS — H59039 Cystoid macular edema following cataract surgery, unspecified eye: Secondary | ICD-10-CM | POA: Diagnosis not present

## 2019-08-18 DIAGNOSIS — H401123 Primary open-angle glaucoma, left eye, severe stage: Secondary | ICD-10-CM | POA: Diagnosis not present

## 2019-08-18 DIAGNOSIS — D72829 Elevated white blood cell count, unspecified: Secondary | ICD-10-CM | POA: Insufficient documentation

## 2019-08-18 DIAGNOSIS — D72825 Bandemia: Secondary | ICD-10-CM

## 2019-08-18 DIAGNOSIS — H35352 Cystoid macular degeneration, left eye: Secondary | ICD-10-CM | POA: Diagnosis not present

## 2019-08-18 DIAGNOSIS — H401112 Primary open-angle glaucoma, right eye, moderate stage: Secondary | ICD-10-CM | POA: Diagnosis not present

## 2019-08-18 NOTE — Progress Notes (Signed)
[facility] NOTE  Patient Care Team: Lawerance Cruel, MD as PCP - General (Family Medicine)  HEME/ONC OVERVIEW: 1. Leukocytosis -WBC ~01-41 w/ neutrophilic predominance since late 2020; previously normal   PERTINENT NON-HEM/ONC PROBLEMS: 1. SLE, followed by rheumatology   ASSESSMENT & PLAN:   Leukocytosis -I reviewed the patient's records in detail, including recent ER clinic notes and lab studies -In summary, patient was found with incidental mild leukocytosis in late 2020 (WBC 03.0D with neutrophilic predominance).  She was diagnosed with SLE in late 2020, and was started on Plaquenil and prednisone.  She presented to her rheumatologist for routine follow-up in 07/2019, and the labs were notable for WBC of 29.7k and Hgb of 9.7, for which she was sent to the ER for further evaluation.  Other labs were notable for microcytosis and mildly elevated creatinine.  She was discharged from the ER and referred to hematology for further evaluation.  -I reviewed the lab results in detail with the patient -WBC 8.7k today, normal -I personally reviewed the patient's peripheral blood smear today.  The red blood cells were borderline microcytic with some hypochromia, suggestive of iron deficiency.  There was no schistocytosis.  The white blood cells were of normal morphology. There were no peripheral circulating blasts. The platelets were of normal size and I verified that there were no platelet clumping. -We discussed some of the common causes of leukocytosis with neutrophilic predominance, including medications (including steroids), infections, inflammation, autoimmune disorders, and less likely, blood disorders -I have ordered some baseline studies, including inflammatory markers (CRP and ESR) and infectious studies (HIV, Hep B/C serologies) -Given the patient's SLE on steroid and the normalization of WBC over time, the etiology of her leukocytosis is most likely secondary to  increased neutrophil mobilization from steroid -In the absence of any clinically suspicious symptoms, I do not think further work-up is indicated at this time   Microcytic anemia -Review of CBC showed Hgb in the low 9's with decreased MCV -I have ordered iron profile to assess for iron deficiency -As she has been on oral steroid, she is at increased risk for developing gastric ulcers, which can lead to iron deficiency anemia -Therefore, I strongly recommended the patient to contact her gastroenterology regarding work-up to rule out GI bleeding, including EGD and colonoscopy as needed -We discussed some of the risks, benefits, and alternatives of intravenous iron infusions.  -The patient is symptomatic from anemia; as such, oral supplement is not sufficient to replete iron storage quickly and she will need IV iron to higher levels of iron faster for adequate hematopoiesis.  -Some of the side-effects to be expected including risks of infusion reactions, phlebitis, headaches, nausea and fatigue.   -The patient is willing to proceed.  Assuming that the iron panel confirms iron deficiency, we will tentatively schedule the 1st dose next week, plan for 2 doses.  -Goal is to keep ferritin level greater than 50.  Orders Placed This Encounter  Procedures  . CBC with Differential (Cancer Center Only)    Standing Status:   Future    Standing Expiration Date:   09/23/2020  . CMP ([facility] only)    Standing Status:   Future    Standing Expiration Date:   09/23/2020  . Save Smear ([facility])    Standing Status:   Future    Standing Expiration Date:   08/19/2020  . Ferritin    Standing Status:   Future    Standing Expiration Date:  09/23/2020  . Iron and TIBC    Standing Status:   Future    Standing Expiration Date:   09/23/2020   The total time spent in the encounter was 55 minutes, including face-to-face time with the patient, review of various tests results, order additional studies/medications,  documentation, and coordination of care plan.   All questions were answered. The patient knows to call the clinic with any problems, questions or concerns. No barriers to learning was detected.  Return in 3 months for iron deficiency anemia follow-up.   Tish Men, MD 3/12/20211:25 PM  CHIEF COMPLAINTS/PURPOSE OF CONSULTATION:  "I am tired all the time"  HISTORY OF PRESENTING ILLNESS:  Lynn Jenkins 74 y.o. female is here because of recent incidental leukocytosis on CBC.  Patient was diagnosed with lupus by rheumatology in May 2020 after presenting with chronic joint pain.  She was started on Plaquenil, as well as prednisone in 05/2019, which she has remained on since then.  She is currently taking prednisone 5 mg daily.  She also received 1 dose of methotrexate about a month ago, but was instructed by her rheumatologist to stop the medication after her CBC showed incidental leukocytosis.  She also reports history of chronic anemia, for which she is followed by Dr. Penelope Coop at [facility].  She reports her last colonoscopy was over 3 years ago.  She has been on Nexium for chronic acid reflux.  She has not had any EGD in the past.  She denies any hematochezia or melena.    REVIEW OF SYSTEMS:   Constitutional: ( - ) fevers, ( - )  chills Eyes: ( - ) blurriness of vision, ( - ) double vision, ( - ) watery eyes Ears, nose, mouth, throat, and face: ( - ) mucositis, ( - ) sore throat Respiratory: ( - ) cough, ( - ) dyspnea, ( - ) wheezes Cardiovascular: ( - ) palpitation, ( - ) chest discomfort, ( + ) lower extremity swelling Gastrointestinal:  ( + ) nausea, ( + ) heartburn, ( - ) change in bowel habits Skin: ( - ) abnormal skin rashes Lymphatics: ( - ) new lymphadenopathy, ( - ) easy bruising Neurological: ( - ) numbness, ( - ) tingling Behavioral/Psych: ( - ) mood change, ( - ) new changes  All other systems were reviewed with the patient and are negative.  I have reviewed her chart and materials  related to her cancer extensively and collaborated history with the patient. Summary of oncologic history is as follows: Oncology History   No history exists.    MEDICAL HISTORY:  Past Medical History:  Diagnosis Date  . Anemia   . Arthritis   . Generalized anxiety disorder   . GERD (gastroesophageal reflux disease)   . Glaucoma   . Headache    hx of migraines   . History of hiatal hernia   . Hyperthyroidism   . Migraine   . Mixed hyperlipidemia   . Multinodular goiter   . Osteopenia   . Primary hyperparathyroidism ([place])   . Varicose veins   . Vitamin D deficiency     SURGICAL HISTORY: Past Surgical History:  Procedure Laterality Date  . ABDOMINAL HYSTERECTOMY    . APPENDECTOMY    . JOINT REPLACEMENT    . left foot surgery     . PARATHYROIDECTOMY N/A 11/01/2014   Procedure: NECK EXPLORATION AND PARATHYROIDECTOMY;  Surgeon: Armandina Gemma, MD;  Location: [facility];  Service: General;  Laterality: N/A;  . SHOULDER  SURGERY      SOCIAL HISTORY: Social History   Socioeconomic History  . Marital status: Married    Spouse name: Not on file  . Number of children: 1  . Years of education: Not on file  . Highest education level: Not on file  Occupational History    Comment: office work  Tobacco Use  . Smoking status: Never Smoker  . Smokeless tobacco: Never Used  Substance and Sexual Activity  . Alcohol use: No  . Drug use: No  . Sexual activity: Not on file  Other Topics Concern  . Not on file  Social History Narrative   Lives with spouse   Caffeine- coffee 2 daily   Social Determinants of Health   Financial Resource Strain:   . Difficulty of Paying Living Expenses:   Food Insecurity:   . Worried About Charity fundraiser in the Last Year:   . Arboriculturist in the Last Year:   Transportation Needs:   . Film/video editor (Medical):   Marland Kitchen Lack of Transportation (Non-Medical):   Physical Activity:   . Days of Exercise per Week:   . Minutes of Exercise per  Session:   Stress:   . Feeling of Stress :   Social Connections:   . Frequency of Communication with Friends and Family:   . Frequency of Social Gatherings with Friends and Family:   . Attends Religious Services:   . Active Member of Clubs or Organizations:   . Attends Archivist Meetings:   Marland Kitchen Marital Status:   Intimate Partner Violence:   . Fear of Current or Ex-Partner:   . Emotionally Abused:   Marland Kitchen Physically Abused:   . Sexually Abused:     FAMILY HISTORY: Family History  Problem Relation Age of Onset  . Heart disease Mother   . Hyperlipidemia Mother   . Hypertension Mother   . Heart disease Father   . Hyperlipidemia Father   . Hypertension Father   . Cancer Brother   . Hypertension Brother     ALLERGIES:  is allergic to doxycycline; morphine and related; zomig [zolmitriptan]; augmentin [amoxicillin-pot clavulanate]; ceftin [cefuroxime axetil]; gabapentin; biaxin [clarithromycin]; cefdinir; and sumatriptan.  MEDICATIONS:  Current Outpatient Medications  Medication Sig Dispense Refill  . acetaminophen (TYLENOL) 500 MG tablet Take 500-1,000 mg by mouth every 6 (six) hours as needed for mild pain or moderate pain.    Marland Kitchen BIOTIN PO Take 1 capsule by mouth daily.     . brimonidine (ALPHAGAN) 0.2 % ophthalmic solution Place 1 drop into the left eye daily.    . Bromfenac Sodium (PROLENSA) 0.07 % SOLN Place 1 drop into the left eye daily.    . cholecalciferol (VITAMIN D) 1000 UNITS tablet Take 1,000 Units by mouth daily.    Marland Kitchen co-enzyme Q-10 30 MG capsule Take 30 mg by mouth daily.    . Difluprednate (DUREZOL) 0.05 % EMUL Place 1 drop into the left eye in the morning, at noon, and at bedtime.    Marland Kitchen esomeprazole (NEXIUM) 40 MG capsule Take 40 mg by mouth 2 (two) times daily before a meal.     . fenofibrate 160 MG tablet Take 160 mg by mouth daily.    . ferrous sulfate 325 (65 FE) MG tablet Take 325 mg by mouth daily with breakfast.    . furosemide (LASIX) 20 MG tablet Take  20 mg by mouth daily as needed for fluid.     . Glucosamine-Chondroit-Vit C-Mn (GLUCOSAMINE  1500 COMPLEX) CAPS Take 1 capsule by mouth 2 (two) times daily.     Nyoka Cowden Tea, [name], (GREEN TEA PO) Take 1 capsule by mouth daily.    . hydroxychloroquine (PLAQUENIL) 200 MG tablet Take 400 mg by mouth daily.    . meloxicam (MOBIC) 15 MG tablet Take 15 mg by mouth at bedtime.    . metoCLOPramide (REGLAN) 10 MG tablet Take 10 mg by mouth 4 (four) times daily.    . Misc Natural Products (TOTAL MEMORY & FOCUS FORMULA PO) Take 2 tablets by mouth daily.    . Multiple Vitamin (MULTIVITAMIN WITH MINERALS) TABS tablet Take 1 tablet by mouth daily.    Mckinley Jewel Dimesylate (RHOPRESSA) 0.02 % SOLN Place 1 drop into the left eye at bedtime.    . Omega 3 1000 MG CAPS Take 2,000 mg by mouth 2 (two) times daily.    . ondansetron (ZOFRAN ODT) 4 MG disintegrating tablet Take 1 tablet (4 mg total) by mouth every 8 (eight) hours as needed for nausea or vomiting. 20 tablet 0  . predniSONE (DELTASONE) 5 MG tablet Take 5 mg by mouth daily with breakfast.    . promethazine (PHENERGAN) 25 MG tablet Take 25 mg by mouth every 6 (six) hours as needed for nausea or vomiting.    . sertraline (ZOLOFT) 100 MG tablet Take 100 mg by mouth daily.    . SUMAtriptan (IMITREX) 100 MG tablet 100 mg as needed. 1 tab at onset of headache, may reoeat  In 2 hrs, max of 2 pills in 24 hrs    . vitamin B-12 (CYANOCOBALAMIN) 1000 MCG tablet Take 1,000 mcg by mouth daily.    . vitamin C (ASCORBIC ACID) 500 MG tablet Take 500 mg by mouth daily as needed (when sick).     Marland Kitchen XALATAN 0.005 % ophthalmic solution Place 1 drop into both eyes at bedtime.  4   No current facility-administered medications for this visit.    PHYSICAL EXAMINATION: ECOG PERFORMANCE STATUS: 2 - Symptomatic, <50% confined to bed  Vitals:   08/20/19 1305  BP: (!) 119/92  Pulse: 60  Resp: 19  Temp: (!) 97.3 F (36.3 C)  SpO2: 96%   Filed Weights    08/20/19 1305  Weight: 209 lb (94.8 kg)    GENERAL: alert, no distress and comfortable SKIN: skin color, texture, turgor are normal, no rashes or significant lesions EYES: conjunctiva are pink and non-injected, sclera clear OROPHARYNX: no exudate, no erythema; lips, buccal mucosa, and tongue normal  NECK: supple, non-tender LUNGS: clear to auscultation with normal breathing effort HEART: regular rate & rhythm, no murmurs, trace bilateral lower extremity edema ABDOMEN: soft, non-tender, non-distended, normal bowel sounds Musculoskeletal: no cyanosis of digits and no clubbing  PSYCH: alert & oriented x 3, fluent speech NEURO: no focal motor/sensory deficits  LABORATORY DATA:  I have reviewed the data as listed Lab Results  Component Value Date   WBC 8.7 08/20/2019   HGB 9.5 (L) 08/20/2019   HCT 33.7 (L) 08/20/2019   MCV 80.8 08/20/2019   PLT 327 08/20/2019   Lab Results  Component Value Date   NA 140 08/20/2019   K 4.0 08/20/2019   CL 106 08/20/2019   CO2 30 08/20/2019    RADIOGRAPHIC STUDIES: I have personally reviewed the radiological images as listed and agreed with the findings in the report. No results found.  PATHOLOGY: I have reviewed the pathology reports as documented in the oncologist history.

## 2019-08-20 ENCOUNTER — Inpatient Hospital Stay: Payer: Medicare Other | Attending: Hematology | Admitting: Hematology

## 2019-08-20 ENCOUNTER — Other Ambulatory Visit: Payer: Self-pay

## 2019-08-20 ENCOUNTER — Inpatient Hospital Stay: Payer: Medicare Other

## 2019-08-20 ENCOUNTER — Encounter: Payer: Self-pay | Admitting: Hematology

## 2019-08-20 VITALS — BP 119/92 | HR 60 | Temp 97.3°F | Resp 19 | Ht 61.02 in | Wt 209.0 lb

## 2019-08-20 DIAGNOSIS — D509 Iron deficiency anemia, unspecified: Secondary | ICD-10-CM | POA: Insufficient documentation

## 2019-08-20 DIAGNOSIS — M329 Systemic lupus erythematosus, unspecified: Secondary | ICD-10-CM | POA: Insufficient documentation

## 2019-08-20 DIAGNOSIS — Z79899 Other long term (current) drug therapy: Secondary | ICD-10-CM | POA: Insufficient documentation

## 2019-08-20 DIAGNOSIS — D72829 Elevated white blood cell count, unspecified: Secondary | ICD-10-CM | POA: Diagnosis not present

## 2019-08-20 DIAGNOSIS — D72825 Bandemia: Secondary | ICD-10-CM | POA: Diagnosis not present

## 2019-08-20 DIAGNOSIS — E559 Vitamin D deficiency, unspecified: Secondary | ICD-10-CM | POA: Insufficient documentation

## 2019-08-20 DIAGNOSIS — M8580 Other specified disorders of bone density and structure, unspecified site: Secondary | ICD-10-CM | POA: Diagnosis not present

## 2019-08-20 DIAGNOSIS — R7989 Other specified abnormal findings of blood chemistry: Secondary | ICD-10-CM | POA: Diagnosis not present

## 2019-08-20 DIAGNOSIS — G8929 Other chronic pain: Secondary | ICD-10-CM | POA: Diagnosis not present

## 2019-08-20 DIAGNOSIS — M1990 Unspecified osteoarthritis, unspecified site: Secondary | ICD-10-CM | POA: Insufficient documentation

## 2019-08-20 DIAGNOSIS — K219 Gastro-esophageal reflux disease without esophagitis: Secondary | ICD-10-CM | POA: Insufficient documentation

## 2019-08-20 DIAGNOSIS — E785 Hyperlipidemia, unspecified: Secondary | ICD-10-CM | POA: Diagnosis not present

## 2019-08-20 LAB — HEPATITIS B SURFACE ANTIBODY,QUALITATIVE: Hep B S Ab: NONREACTIVE

## 2019-08-20 LAB — CMP (CANCER CENTER ONLY)
ALT: 13 U/L (ref 0–44)
AST: 13 U/L — ABNORMAL LOW (ref 15–41)
Albumin: 4 g/dL (ref 3.5–5.0)
Alkaline Phosphatase: 54 U/L (ref 38–126)
Anion gap: 4 — ABNORMAL LOW (ref 5–15)
BUN: 25 mg/dL — ABNORMAL HIGH (ref 8–23)
CO2: 30 mmol/L (ref 22–32)
Calcium: 11.7 mg/dL — ABNORMAL HIGH (ref 8.9–10.3)
Chloride: 106 mmol/L (ref 98–111)
Creatinine: 1.13 mg/dL — ABNORMAL HIGH (ref 0.44–1.00)
GFR, Est AFR Am: 56 mL/min — ABNORMAL LOW (ref >60)
GFR, Estimated: 49 mL/min — ABNORMAL LOW (ref >60)
Glucose, Bld: 141 mg/dL — ABNORMAL HIGH (ref 70–99)
Potassium: 4 mmol/L (ref 3.5–5.1)
Sodium: 140 mmol/L (ref 135–145)
Total Bilirubin: 0.4 mg/dL (ref 0.3–1.2)
Total Protein: 6.8 g/dL (ref 6.5–8.1)

## 2019-08-20 LAB — CBC WITH DIFFERENTIAL (CANCER CENTER ONLY)
Abs Immature Granulocytes: 0.03 10*3/uL (ref 0.00–0.07)
Basophils Absolute: 0.1 10*3/uL (ref 0.0–0.1)
Basophils Relative: 1 %
Eosinophils Absolute: 0.3 10*3/uL (ref 0.0–0.5)
Eosinophils Relative: 3 %
HCT: 33.7 % — ABNORMAL LOW (ref 36.0–46.0)
Hemoglobin: 9.5 g/dL — ABNORMAL LOW (ref 12.0–15.0)
Immature Granulocytes: 0 %
Lymphocytes Relative: 16 %
Lymphs Abs: 1.4 10*3/uL (ref 0.7–4.0)
MCH: 22.8 pg — ABNORMAL LOW (ref 26.0–34.0)
MCHC: 28.2 g/dL — ABNORMAL LOW (ref 30.0–36.0)
MCV: 80.8 fL (ref 80.0–100.0)
Monocytes Absolute: 0.5 10*3/uL (ref 0.1–1.0)
Monocytes Relative: 6 %
Neutro Abs: 6.5 10*3/uL (ref 1.7–7.7)
Neutrophils Relative %: 74 %
Platelet Count: 327 10*3/uL (ref 150–400)
RBC: 4.17 MIL/uL (ref 3.87–5.11)
RDW: 19.3 % — ABNORMAL HIGH (ref 11.5–15.5)
WBC Count: 8.7 10*3/uL (ref 4.0–10.5)
nRBC: 0 % (ref 0.0–0.2)

## 2019-08-20 LAB — SAVE SMEAR(SSMR), FOR PROVIDER SLIDE REVIEW

## 2019-08-20 LAB — HEPATITIS B SURFACE ANTIGEN: Hepatitis B Surface Ag: NONREACTIVE

## 2019-08-20 LAB — SEDIMENTATION RATE: Sed Rate: 24 mm/hr — ABNORMAL HIGH (ref 0–22)

## 2019-08-20 LAB — C-REACTIVE PROTEIN: CRP: 3.7 mg/dL — ABNORMAL HIGH (ref <1.0)

## 2019-08-20 LAB — HIV ANTIBODY (ROUTINE TESTING W REFLEX): HIV Screen 4th Generation wRfx: NONREACTIVE

## 2019-08-21 LAB — SOLUBLE TRANSFERRIN RECEPTOR: Transferrin Receptor: 41.7 nmol/L — ABNORMAL HIGH (ref 12.2–27.3)

## 2019-08-23 DIAGNOSIS — M79605 Pain in left leg: Secondary | ICD-10-CM | POA: Diagnosis not present

## 2019-08-23 DIAGNOSIS — Z79891 Long term (current) use of opiate analgesic: Secondary | ICD-10-CM | POA: Diagnosis not present

## 2019-08-23 DIAGNOSIS — G894 Chronic pain syndrome: Secondary | ICD-10-CM | POA: Diagnosis not present

## 2019-08-23 DIAGNOSIS — M329 Systemic lupus erythematosus, unspecified: Secondary | ICD-10-CM | POA: Diagnosis not present

## 2019-08-23 DIAGNOSIS — M47817 Spondylosis without myelopathy or radiculopathy, lumbosacral region: Secondary | ICD-10-CM | POA: Diagnosis not present

## 2019-08-23 DIAGNOSIS — Z79899 Other long term (current) drug therapy: Secondary | ICD-10-CM | POA: Diagnosis not present

## 2019-08-23 LAB — LACTATE DEHYDROGENASE: LDH: 140 U/L (ref 98–192)

## 2019-08-23 LAB — IRON AND TIBC
Iron: 21 ug/dL — ABNORMAL LOW (ref 41–142)
Saturation Ratios: 5 % — ABNORMAL LOW (ref 21–57)
TIBC: 436 ug/dL (ref 236–444)
UIBC: 415 ug/dL — ABNORMAL HIGH (ref 120–384)

## 2019-08-23 LAB — FERRITIN: Ferritin: 38 ng/mL (ref 11–307)

## 2019-08-24 ENCOUNTER — Telehealth: Payer: Self-pay | Admitting: *Deleted

## 2019-08-24 NOTE — Telephone Encounter (Signed)
-----   Message from Tish Men, MD sent at 08/23/2019  9:05 AM EDT ----- Delrae Sawyers,  Can you let Ms. Schappert know that her iron level is low, so we will proceed with IV iron as scheduled?Thanks.  [place]  ----- Message ----- From: Buel Ream, Lab In [facility] Sent: 08/20/2019  12:46 PM EDT To: Tish Men, MD

## 2019-08-24 NOTE — Telephone Encounter (Signed)
As noted below by Dr. Maylon Peppers, I informed the patient that her iron level is low, and she is scheduled for two dose of IV iron. This Friday, 08/27/19 at 2pm. She verbalized understanding.

## 2019-08-27 ENCOUNTER — Other Ambulatory Visit: Payer: Self-pay | Admitting: Pain Medicine

## 2019-08-27 ENCOUNTER — Inpatient Hospital Stay: Payer: Medicare Other

## 2019-08-27 DIAGNOSIS — W06XXXA Fall from bed, initial encounter: Secondary | ICD-10-CM | POA: Diagnosis not present

## 2019-08-27 DIAGNOSIS — M79605 Pain in left leg: Secondary | ICD-10-CM

## 2019-08-27 DIAGNOSIS — S0083XA Contusion of other part of head, initial encounter: Secondary | ICD-10-CM | POA: Diagnosis not present

## 2019-08-30 DIAGNOSIS — M064 Inflammatory polyarthropathy: Secondary | ICD-10-CM | POA: Diagnosis not present

## 2019-08-30 DIAGNOSIS — M79643 Pain in unspecified hand: Secondary | ICD-10-CM | POA: Diagnosis not present

## 2019-08-30 DIAGNOSIS — R768 Other specified abnormal immunological findings in serum: Secondary | ICD-10-CM | POA: Diagnosis not present

## 2019-08-30 DIAGNOSIS — M359 Systemic involvement of connective tissue, unspecified: Secondary | ICD-10-CM | POA: Diagnosis not present

## 2019-09-01 ENCOUNTER — Other Ambulatory Visit: Payer: Self-pay

## 2019-09-01 ENCOUNTER — Emergency Department (HOSPITAL_COMMUNITY)
Admission: EM | Admit: 2019-09-01 | Discharge: 2019-09-01 | Disposition: A | Discharge status: Home / Self Care | Payer: Medicare Other | Attending: Emergency Medicine | Admitting: Emergency Medicine

## 2019-09-01 ENCOUNTER — Emergency Department (HOSPITAL_COMMUNITY): Payer: Medicare Other

## 2019-09-01 ENCOUNTER — Encounter (HOSPITAL_COMMUNITY): Payer: Self-pay

## 2019-09-01 DIAGNOSIS — Y9289 Other specified places as the place of occurrence of the external cause: Secondary | ICD-10-CM | POA: Insufficient documentation

## 2019-09-01 DIAGNOSIS — W01198A Fall on same level from slipping, tripping and stumbling with subsequent striking against other object, initial encounter: Secondary | ICD-10-CM | POA: Diagnosis not present

## 2019-09-01 DIAGNOSIS — Z79899 Other long term (current) drug therapy: Secondary | ICD-10-CM | POA: Diagnosis not present

## 2019-09-01 DIAGNOSIS — S0993XA Unspecified injury of face, initial encounter: Secondary | ICD-10-CM | POA: Diagnosis not present

## 2019-09-01 DIAGNOSIS — Y999 Unspecified external cause status: Secondary | ICD-10-CM | POA: Insufficient documentation

## 2019-09-01 DIAGNOSIS — S0990XA Unspecified injury of head, initial encounter: Secondary | ICD-10-CM | POA: Diagnosis not present

## 2019-09-01 DIAGNOSIS — Y9389 Activity, other specified: Secondary | ICD-10-CM | POA: Diagnosis not present

## 2019-09-01 DIAGNOSIS — S0083XA Contusion of other part of head, initial encounter: Secondary | ICD-10-CM | POA: Diagnosis not present

## 2019-09-01 DIAGNOSIS — W19XXXA Unspecified fall, initial encounter: Secondary | ICD-10-CM

## 2019-09-01 DIAGNOSIS — R42 Dizziness and giddiness: Secondary | ICD-10-CM | POA: Diagnosis not present

## 2019-09-01 NOTE — ED Notes (Signed)
Pt transported to CT ?

## 2019-09-01 NOTE — ED Triage Notes (Signed)
Pt presents after a fall on Friday (5 days ago). Pt has significant bruising and redness to her left eye. Sent here by her MD for a scan of her head. Pt reports some dizziness. Pt is alert and oriented, able to answer all questions.

## 2019-09-01 NOTE — ED Provider Notes (Signed)
[facility] DEPT Provider Note   CSN: [id number] Arrival date & time: 09/01/19  1422     History Chief Complaint  Patient presents with  . Fall    Lynn Jenkins is a 74 y.o. female.  She is here to be evaluated after a fall 5 days ago.  Sounds mechanical and she fell and struck her left forehead and temple area.  Since then she has had some bruising and redness around her left eye and some dizziness.  No problems with balance.  She supposed to get some type of procedure by her oncologist and they want to make sure she has not had any bleeding.  Not on any blood thinners.  The history is provided by the patient.  Head Injury Location:  L temporal and frontal Time since incident:  5 days Mechanism of injury: fall   Fall:    Fall occurred:  Tripped   Height of fall:  [place] of impact:  Face and head   Entrapped after fall: no   Pain details:    Quality:  Throbbing   Severity:  Moderate   Duration:  5 days   Timing:  Constant   Progression:  Improving Chronicity:  New Relieved by:  Ice and rest Worsened by:  Pressure Ineffective treatments:  None tried Associated symptoms: headache   Associated symptoms: no blurred vision, no difficulty breathing, no disorientation, no double vision, no focal weakness, no loss of consciousness, no nausea, no neck pain and no vomiting   Risk factors: being elderly        Past Medical History:  Diagnosis Date  . Anemia   . Arthritis   . Generalized anxiety disorder   . GERD (gastroesophageal reflux disease)   . Glaucoma   . Headache    hx of migraines   . History of hiatal hernia   . Hyperthyroidism   . Migraine   . Mixed hyperlipidemia   . Multinodular goiter   . Osteopenia   . Primary hyperparathyroidism ([place])   . Varicose veins   . Vitamin D deficiency     Patient Active Problem List   Diagnosis Date Noted  . Leukocytosis 08/18/2019  . Microcytic anemia 08/18/2019  . Primary  hyperparathyroidism ([place]) 11/01/2014  . Hyperparathyroidism, primary ([place]) 10/31/2014  . Varicose veins of lower extremities with other complications [id number]    Past Surgical History:  Procedure Laterality Date  . ABDOMINAL HYSTERECTOMY    . APPENDECTOMY    . JOINT REPLACEMENT    . left foot surgery     . PARATHYROIDECTOMY N/A 11/01/2014   Procedure: NECK EXPLORATION AND PARATHYROIDECTOMY;  Surgeon: Armandina Gemma, MD;  Location: [facility] OR[facility];  Service: General;  Laterality: N/A;  . SHOULDER SURGERY       OB History   No obstetric history on file.     Family History  Problem Relation Age of Onset  . Heart disease Mother   . Hyperlipidemia Mother   . Hypertension Mother   . Heart disease Father   . Hyperlipidemia Father   . Hypertension Father   . Cancer Brother   . Hypertension Brother     Social History   Tobacco Use  . Smoking status: Never Smoker  . Smokeless tobacco: Never Used  Substance Use Topics  . Alcohol use: No  . Drug use: No    Home Medications Prior to Admission medications   Medication Sig Start Date End Date Taking? Authorizing Provider  acetaminophen (TYLENOL)  500 MG tablet Take 500-1,000 mg by mouth every 6 (six) hours as needed for mild pain or moderate pain.    [provider]  BIOTIN PO Take 1 capsule by mouth daily.     [provider]  brimonidine (ALPHAGAN) 0.2 % ophthalmic solution Place 1 drop into the left eye daily.    [provider]  Bromfenac Sodium (PROLENSA) 0.07 % SOLN Place 1 drop into the left eye daily.    [provider]  cholecalciferol (VITAMIN D) 1000 UNITS tablet Take 1,000 Units by mouth daily.    [provider]  co-enzyme Q-10 30 MG capsule Take 30 mg by mouth daily.    [provider]  Difluprednate (DUREZOL) 0.05 % EMUL Place 1 drop into the left eye in the morning, at noon, and at bedtime.    [provider]  esomeprazole (NEXIUM) 40 MG capsule Take 40 mg by  mouth 2 (two) times daily before a meal.     [provider]  fenofibrate 160 MG tablet Take 160 mg by mouth daily.    [provider]  ferrous sulfate 325 (65 FE) MG tablet Take 325 mg by mouth daily with breakfast.    [provider]  furosemide (LASIX) 20 MG tablet Take 20 mg by mouth daily as needed for fluid.  08/26/18   [provider]  Glucosamine-Chondroit-Vit C-Mn (GLUCOSAMINE 1500 COMPLEX) CAPS Take 1 capsule by mouth 2 (two) times daily.     [provider]  Eloise Levels, [name], (GREEN TEA PO) Take 1 capsule by mouth daily.    [provider]  hydroxychloroquine (PLAQUENIL) 200 MG tablet Take 400 mg by mouth daily.    [provider]  meloxicam (MOBIC) 15 MG tablet Take 15 mg by mouth at bedtime.    [provider]  metoCLOPramide (REGLAN) 10 MG tablet Take 10 mg by mouth 4 (four) times daily.    [provider]  Misc Natural Products (TOTAL MEMORY & FOCUS FORMULA PO) Take 2 tablets by mouth daily.    [provider]  Multiple Vitamin (MULTIVITAMIN WITH MINERALS) TABS tablet Take 1 tablet by mouth daily.    [provider]  Netarsudil Dimesylate (RHOPRESSA) 0.02 % SOLN Place 1 drop into the left eye at bedtime.    [provider]  Omega 3 1000 MG CAPS Take 2,000 mg by mouth 2 (two) times daily.    [provider]  ondansetron (ZOFRAN ODT) 4 MG disintegrating tablet Take 1 tablet (4 mg total) by mouth every 8 (eight) hours as needed for nausea or vomiting. 12/18/17   [name], MD  predniSONE (DELTASONE) 5 MG tablet Take 5 mg by mouth daily with breakfast.    [provider]  promethazine (PHENERGAN) 25 MG tablet Take 25 mg by mouth every 6 (six) hours as needed for nausea or vomiting.    [provider]  sertraline (ZOLOFT) 100 MG tablet Take 100 mg by mouth daily.    [provider]  SUMAtriptan (IMITREX) 100 MG tablet 100 mg as  needed. 1 tab at onset of headache, may reoeat  In 2 hrs, max of 2 pills in 24 hrs 08/26/18   [provider]  vitamin B-12 (CYANOCOBALAMIN) 1000 MCG tablet Take 1,000 mcg by mouth daily.    [provider]  vitamin C (ASCORBIC ACID) 500 MG tablet Take 500 mg by mouth daily as needed (when sick).     [provider]  XALATAN 0.005 % ophthalmic solution Place 1 drop into both eyes at bedtime. 03/18/15   [provider]    Allergies    Doxycycline, Morphine and related, Zomig [zolmitriptan], Augmentin [amoxicillin-pot clavulanate], Ceftin [cefuroxime axetil], Gabapentin, Biaxin [clarithromycin], Cefdinir, and Sumatriptan  Review of Systems   Review of Systems  Constitutional: Negative for fever.  HENT: Negative for sore throat.   Eyes: Negative for blurred vision, double vision and visual disturbance.  Respiratory: Negative for shortness of breath.   Cardiovascular: Negative for chest pain.  Gastrointestinal: Negative for abdominal pain, nausea and vomiting.  Genitourinary: Negative for dysuria.  Musculoskeletal: Negative for neck pain.  Skin: Negative for rash.  Neurological: Positive for headaches. Negative for focal weakness and loss of consciousness.    Physical Exam Updated Vital Signs BP 121/77 (BP Location: Left Arm)   Pulse 72   Temp 98.2 F (36.8 C) (Oral)   Resp 16   SpO2 97%   Physical Exam Vitals and nursing note reviewed.  Constitutional:      General: She is not in acute distress.    Appearance: She is well-developed.  HENT:     Head: Normocephalic.     Comments: She is some bruising and tenderness throughout her left forehead and around her left eye.  No crepitus noted. Eyes:     Extraocular Movements: Extraocular movements intact.     Conjunctiva/sclera: Conjunctivae normal.     Pupils: Pupils are equal, round, and reactive to light.  Cardiovascular:     Rate and Rhythm: Normal rate and regular rhythm.     Heart sounds: No  murmur.  Pulmonary:     Effort: Pulmonary effort is normal. No respiratory distress.     Breath sounds: Normal breath sounds.  Abdominal:     Palpations: Abdomen is soft.     Tenderness: There is no abdominal tenderness.  Musculoskeletal:        General: No deformity or signs of injury.     Cervical back: Neck supple.  Skin:    General: Skin is warm and dry.  Neurological:     General: No focal deficit present.     Mental Status: She is alert.     Sensory: No sensory deficit.     Motor: No weakness.     Gait: Gait normal.     ED Results / Procedures / Treatments   Labs (all labs ordered are listed, but only abnormal results are displayed) Labs Reviewed - No data to display  EKG None  Radiology CT Head Wo Contrast  Result Date: 09/01/2019 CLINICAL DATA:  [place] 5 days ago, left periorbital ecchymosis, dizziness EXAM: CT HEAD WITHOUT CONTRAST TECHNIQUE: Contiguous axial images were obtained from the base of the skull through the vertex without intravenous contrast. COMPARISON:  03/05/2019 FINDINGS: Brain: No acute infarct or hemorrhage. Lateral ventricles and midline structures are unremarkable. No acute extra-axial fluid collections. No mass effect. Vascular: No hyperdense vessel or unexpected calcification. Skull: Normal. Negative for fracture or focal lesion. Sinuses/Orbits: No acute finding. Other: None IMPRESSION: 1. No acute intrathoracic process. Electronically Signed   By: Randa Ngo M.D.   On: 09/01/2019 18:02   CT Maxillofacial WO CM  Result Date: 09/01/2019 CLINICAL DATA:  Status post trauma. EXAM: CT HEAD WITHOUT CONTRAST CT MAXILLOFACIAL WITHOUT CONTRAST TECHNIQUE: Multidetector CT imaging of the head and maxillofacial structures were performed using the standard protocol without intravenous contrast. Multiplanar CT image reconstructions of the maxillofacial structures were also generated. COMPARISON:  March 05, 2019 FINDINGS: CT HEAD FINDINGS Brain: There is mild  cerebral atrophy with widening of the extra-axial spaces and ventricular dilatation. There are areas of decreased attenuation within the white matter tracts of the supratentorial brain, consistent with microvascular disease changes. Vascular: No hyperdense vessel or unexpected calcification. Skull: Normal. Negative for fracture or focal lesion. Other: None. CT MAXILLOFACIAL FINDINGS Osseous: No fracture or mandibular dislocation. No destructive process. Orbits: The left lens is not identified. This is seen on the prior study. Sinuses: Clear. Soft tissues: Negative. IMPRESSION: 1. Generalized cerebral atrophy. 2. No acute fracture or acute intracranial abnormality. Electronically Signed   By: Virgina Norfolk M.D.   On: 09/01/2019 18:08    Procedures Procedures (including critical care time)  Medications Ordered in ED Medications - No data to display  ED Course  I have reviewed the triage vital signs and the nursing notes.  Pertinent labs & imaging results that were available during my care of the patient were reviewed by me and considered in my medical decision making (see chart for details).  Clinical Course as of Sep 01 957  Wed Sep 01, 2019  1720 Differential diagnosis includes contusion, intracerebral bleed, epidural, subdural, skull fracture, facial fracture   [MB]    Clinical Course User Index [MB] Hayden Rasmussen, MD   MDM Rules/Calculators/A&P                       Final Clinical Impression(s) / ED Diagnoses Final diagnoses:  Fall, initial encounter  Contusion of face, initial encounter  Dizziness    Rx / DC Orders ED Discharge Orders    None       Hayden Rasmussen, MD 09/02/19 [phone]

## 2019-09-01 NOTE — Discharge Instructions (Addendum)
You were seen in the emergency department for evaluation of injuries from a fall 5 days ago. You had a CAT scan of your head and face that showed no internal bleeding. No signs of skull or facial fractures.

## 2019-09-03 ENCOUNTER — Inpatient Hospital Stay: Payer: Medicare Other

## 2019-09-03 ENCOUNTER — Other Ambulatory Visit: Payer: Self-pay

## 2019-09-03 VITALS — BP 147/81 | HR 73 | Temp 97.7°F | Resp 17

## 2019-09-03 DIAGNOSIS — M329 Systemic lupus erythematosus, unspecified: Secondary | ICD-10-CM | POA: Diagnosis not present

## 2019-09-03 DIAGNOSIS — D72829 Elevated white blood cell count, unspecified: Secondary | ICD-10-CM | POA: Diagnosis not present

## 2019-09-03 DIAGNOSIS — D509 Iron deficiency anemia, unspecified: Secondary | ICD-10-CM

## 2019-09-03 DIAGNOSIS — Z79899 Other long term (current) drug therapy: Secondary | ICD-10-CM | POA: Diagnosis not present

## 2019-09-03 DIAGNOSIS — R7989 Other specified abnormal findings of blood chemistry: Secondary | ICD-10-CM | POA: Diagnosis not present

## 2019-09-03 DIAGNOSIS — E785 Hyperlipidemia, unspecified: Secondary | ICD-10-CM | POA: Diagnosis not present

## 2019-09-03 DIAGNOSIS — K219 Gastro-esophageal reflux disease without esophagitis: Secondary | ICD-10-CM | POA: Diagnosis not present

## 2019-09-03 DIAGNOSIS — M1990 Unspecified osteoarthritis, unspecified site: Secondary | ICD-10-CM | POA: Diagnosis not present

## 2019-09-03 DIAGNOSIS — M8580 Other specified disorders of bone density and structure, unspecified site: Secondary | ICD-10-CM | POA: Diagnosis not present

## 2019-09-03 DIAGNOSIS — E559 Vitamin D deficiency, unspecified: Secondary | ICD-10-CM | POA: Diagnosis not present

## 2019-09-03 DIAGNOSIS — G8929 Other chronic pain: Secondary | ICD-10-CM | POA: Diagnosis not present

## 2019-09-03 MED ORDER — SODIUM CHLORIDE 0.9 % IV SOLN
Freq: Once | INTRAVENOUS | Status: AC
  Filled 2019-09-03: qty 250

## 2019-09-03 MED ORDER — SODIUM CHLORIDE 0.9 % IV SOLN
510.0000 mg | Freq: Once | INTRAVENOUS | Status: AC
  Administered 2019-09-03: 510 mg via INTRAVENOUS
  Filled 2019-09-03: qty 510

## 2019-09-03 NOTE — Patient Instructions (Signed)

## 2019-09-08 DIAGNOSIS — H59039 Cystoid macular edema following cataract surgery, unspecified eye: Secondary | ICD-10-CM | POA: Diagnosis not present

## 2019-09-08 DIAGNOSIS — H401132 Primary open-angle glaucoma, bilateral, moderate stage: Secondary | ICD-10-CM | POA: Diagnosis not present

## 2019-09-08 DIAGNOSIS — Z961 Presence of intraocular lens: Secondary | ICD-10-CM | POA: Diagnosis not present

## 2019-09-08 DIAGNOSIS — H2511 Age-related nuclear cataract, right eye: Secondary | ICD-10-CM | POA: Diagnosis not present

## 2019-09-10 ENCOUNTER — Other Ambulatory Visit: Payer: Self-pay

## 2019-09-10 ENCOUNTER — Inpatient Hospital Stay: Payer: Medicare Other | Attending: Hematology

## 2019-09-10 VITALS — BP 132/81 | HR 66 | Temp 98.0°F | Resp 16

## 2019-09-10 DIAGNOSIS — D72829 Elevated white blood cell count, unspecified: Secondary | ICD-10-CM | POA: Insufficient documentation

## 2019-09-10 DIAGNOSIS — D509 Iron deficiency anemia, unspecified: Secondary | ICD-10-CM | POA: Diagnosis not present

## 2019-09-10 DIAGNOSIS — Z79899 Other long term (current) drug therapy: Secondary | ICD-10-CM | POA: Insufficient documentation

## 2019-09-10 MED ORDER — SODIUM CHLORIDE 0.9 % IV SOLN
510.0000 mg | Freq: Once | INTRAVENOUS | Status: AC
  Administered 2019-09-10: 510 mg via INTRAVENOUS
  Filled 2019-09-10: qty 17

## 2019-09-10 MED ORDER — SODIUM CHLORIDE 0.9 % IV SOLN
Freq: Once | INTRAVENOUS | Status: AC
  Filled 2019-09-10: qty 250

## 2019-09-14 ENCOUNTER — Other Ambulatory Visit: Payer: Self-pay | Admitting: Family Medicine

## 2019-09-14 DIAGNOSIS — Z1231 Encounter for screening mammogram for malignant neoplasm of breast: Secondary | ICD-10-CM

## 2019-09-16 ENCOUNTER — Emergency Department (HOSPITAL_COMMUNITY): Payer: Medicare Other

## 2019-09-16 ENCOUNTER — Emergency Department (HOSPITAL_COMMUNITY)
Admission: EM | Admit: 2019-09-16 | Discharge: 2019-09-17 | Disposition: A | Discharge status: Home / Self Care | Payer: Medicare Other | Attending: Emergency Medicine | Admitting: Emergency Medicine

## 2019-09-16 ENCOUNTER — Encounter (HOSPITAL_COMMUNITY): Payer: Self-pay

## 2019-09-16 ENCOUNTER — Other Ambulatory Visit: Payer: Self-pay

## 2019-09-16 DIAGNOSIS — S61213A Laceration without foreign body of left middle finger without damage to nail, initial encounter: Secondary | ICD-10-CM | POA: Diagnosis not present

## 2019-09-16 DIAGNOSIS — M25462 Effusion, left knee: Secondary | ICD-10-CM | POA: Diagnosis not present

## 2019-09-16 DIAGNOSIS — Z23 Encounter for immunization: Secondary | ICD-10-CM | POA: Insufficient documentation

## 2019-09-16 DIAGNOSIS — Y92018 Other place in single-family (private) house as the place of occurrence of the external cause: Secondary | ICD-10-CM | POA: Diagnosis not present

## 2019-09-16 DIAGNOSIS — S6992XA Unspecified injury of left wrist, hand and finger(s), initial encounter: Secondary | ICD-10-CM | POA: Diagnosis present

## 2019-09-16 DIAGNOSIS — M7989 Other specified soft tissue disorders: Secondary | ICD-10-CM | POA: Diagnosis not present

## 2019-09-16 DIAGNOSIS — S61215A Laceration without foreign body of left ring finger without damage to nail, initial encounter: Secondary | ICD-10-CM | POA: Insufficient documentation

## 2019-09-16 DIAGNOSIS — Z79899 Other long term (current) drug therapy: Secondary | ICD-10-CM | POA: Diagnosis not present

## 2019-09-16 DIAGNOSIS — Y9389 Activity, other specified: Secondary | ICD-10-CM | POA: Insufficient documentation

## 2019-09-16 DIAGNOSIS — R52 Pain, unspecified: Secondary | ICD-10-CM

## 2019-09-16 DIAGNOSIS — M25562 Pain in left knee: Secondary | ICD-10-CM | POA: Diagnosis not present

## 2019-09-16 DIAGNOSIS — W19XXXA Unspecified fall, initial encounter: Secondary | ICD-10-CM | POA: Diagnosis not present

## 2019-09-16 DIAGNOSIS — Y998 Other external cause status: Secondary | ICD-10-CM | POA: Insufficient documentation

## 2019-09-16 DIAGNOSIS — S63293A Dislocation of distal interphalangeal joint of left middle finger, initial encounter: Secondary | ICD-10-CM | POA: Diagnosis not present

## 2019-09-16 NOTE — ED Triage Notes (Signed)
Trash can fell on pt fingers. Left 3rd and 4th digit. Deep/ open laceration where bone is visualized.

## 2019-09-17 ENCOUNTER — Emergency Department (HOSPITAL_COMMUNITY): Payer: Medicare Other

## 2019-09-17 DIAGNOSIS — M79642 Pain in left hand: Secondary | ICD-10-CM | POA: Diagnosis not present

## 2019-09-17 DIAGNOSIS — S61215A Laceration without foreign body of left ring finger without damage to nail, initial encounter: Secondary | ICD-10-CM | POA: Diagnosis not present

## 2019-09-17 DIAGNOSIS — M7989 Other specified soft tissue disorders: Secondary | ICD-10-CM | POA: Diagnosis not present

## 2019-09-17 DIAGNOSIS — S61213A Laceration without foreign body of left middle finger without damage to nail, initial encounter: Secondary | ICD-10-CM | POA: Diagnosis not present

## 2019-09-17 DIAGNOSIS — M25462 Effusion, left knee: Secondary | ICD-10-CM | POA: Diagnosis not present

## 2019-09-17 MED ORDER — LIDOCAINE HCL (PF) 1 % IJ SOLN
30.0000 mL | Freq: Once | INTRAMUSCULAR | Status: AC
  Administered 2019-09-17: 03:00:00 30 mL
  Filled 2019-09-17: qty 30

## 2019-09-17 MED ORDER — TETANUS-DIPHTH-ACELL PERTUSSIS 5-2.5-18.5 LF-MCG/0.5 IM SUSP
0.5000 mL | Freq: Once | INTRAMUSCULAR | Status: AC
  Administered 2019-09-17: 0.5 mL via INTRAMUSCULAR
  Filled 2019-09-17: qty 0.5

## 2019-09-17 MED ORDER — FENTANYL CITRATE (PF) 100 MCG/2ML IJ SOLN
50.0000 ug | Freq: Once | INTRAMUSCULAR | Status: AC
  Administered 2019-09-17: 01:00:00 50 ug via INTRAMUSCULAR
  Filled 2019-09-17: qty 2

## 2019-09-17 NOTE — ED Provider Notes (Addendum)
[facility] DEPT Provider Note   CSN: [id number] Arrival date & time: 09/16/19  2130     History Chief Complaint  Patient presents with  . Finger Injury    Lynn Jenkins is a 74 y.o. female with a hx of anemia, varicose veins, migraine headaches, osteopenia, primary hyperparathyroidism presents to the Emergency Department complaining of acute, persistent laceration of the left hand.  Patient reports she was rolling the recycling bin to the road when she fell and the been landed on her left hand.  She reports associated pain in the left knee.  She reports she has been ambulatory since that time.  She denies hitting her head or loss of consciousness.  She denies taking blood thinners.  Unknown last tetanus shot.  Patient reports decreased range of motion in the left middle and ring fingers but denies numbness or tingling. Last oral intake 3pm.   The history is provided by the patient and medical records. No language interpreter was used.       Past Medical History:  Diagnosis Date  . Anemia   . Arthritis   . Generalized anxiety disorder   . GERD (gastroesophageal reflux disease)   . Glaucoma   . Headache    hx of migraines   . History of hiatal hernia   . Hyperthyroidism   . Migraine   . Mixed hyperlipidemia   . Multinodular goiter   . Osteopenia   . Primary hyperparathyroidism ([place])   . Varicose veins   . Vitamin D deficiency     Patient Active Problem List   Diagnosis Date Noted  . Leukocytosis 08/18/2019  . Microcytic anemia 08/18/2019  . Primary hyperparathyroidism ([place]) 11/01/2014  . Hyperparathyroidism, primary ([place]) 10/31/2014  . Varicose veins of lower extremities with other complications [id number]    Past Surgical History:  Procedure Laterality Date  . ABDOMINAL HYSTERECTOMY    . APPENDECTOMY    . JOINT REPLACEMENT    . left foot surgery     . PARATHYROIDECTOMY N/A 11/01/2014   Procedure: NECK EXPLORATION AND  PARATHYROIDECTOMY;  Surgeon: Armandina Gemma, MD;  Location: [facility] ORS;  Service: General;  Laterality: N/A;  . SHOULDER SURGERY       OB History   No obstetric history on file.     Family History  Problem Relation Age of Onset  . Heart disease Mother   . Hyperlipidemia Mother   . Hypertension Mother   . Heart disease Father   . Hyperlipidemia Father   . Hypertension Father   . Cancer Brother   . Hypertension Brother     Social History   Tobacco Use  . Smoking status: Never Smoker  . Smokeless tobacco: Never Used  Substance Use Topics  . Alcohol use: No  . Drug use: No    Home Medications Prior to Admission medications   Medication Sig Start Date End Date Taking? Authorizing Provider  acetaminophen (TYLENOL) 500 MG tablet Take 500-1,000 mg by mouth every 6 (six) hours as needed for mild pain or moderate pain.    [provider]  brimonidine (ALPHAGAN) 0.2 % ophthalmic solution Place 1 drop into the left eye daily.    [provider]  Bromfenac Sodium (PROLENSA) 0.07 % SOLN Place 1 drop into the left eye daily.    [provider]  cholecalciferol (VITAMIN D) 1000 UNITS tablet Take 1,000 Units by mouth daily.    [provider]  co-enzyme Q-10 30 MG capsule Take 30 mg by  mouth daily.    [provider]  Difluprednate (DUREZOL) 0.05 % EMUL Place 1 drop into the left eye in the morning, at noon, and at bedtime.    [provider]  esomeprazole (NEXIUM) 40 MG capsule Take 40 mg by mouth 2 (two) times daily before a meal.     [provider]  fenofibrate 160 MG tablet Take 160 mg by mouth daily.    [provider]  ferrous sulfate 325 (65 FE) MG tablet Take 325 mg by mouth daily with breakfast.    [provider]  furosemide (LASIX) 20 MG tablet Take 20 mg by mouth daily as needed for fluid.  08/26/18   [provider]  Glucosamine-Chondroit-Vit C-Mn (GLUCOSAMINE 1500 COMPLEX) CAPS Take 1 capsule  by mouth 2 (two) times daily.     [provider]  Eloise Levels, [name], (GREEN TEA PO) Take 1 capsule by mouth daily.    [provider]  HYDROcodone-acetaminophen (NORCO/VICODIN) 5-325 MG tablet Take 1 tablet by mouth every 6 (six) hours as needed for moderate pain or severe pain.  08/23/19   [provider]  hydroxychloroquine (PLAQUENIL) 200 MG tablet Take 400 mg by mouth daily.    [provider]  meloxicam (MOBIC) 15 MG tablet Take 15 mg by mouth daily.     [provider]  Misc Natural Products (TOTAL MEMORY & FOCUS FORMULA PO) Take 2 tablets by mouth daily.    [provider]  Multiple Vitamin (MULTIVITAMIN WITH MINERALS) TABS tablet Take 1 tablet by mouth daily.    [provider]  Netarsudil Dimesylate (RHOPRESSA) 0.02 % SOLN Place 1 drop into the left eye at bedtime.    [provider]  Omega 3 1000 MG CAPS Take 2,000 mg by mouth 2 (two) times daily.    [provider]  ondansetron (ZOFRAN ODT) 4 MG disintegrating tablet Take 1 tablet (4 mg total) by mouth every 8 (eight) hours as needed for nausea or vomiting. 12/18/17   [name], MD  predniSONE (DELTASONE) 5 MG tablet Take 2.5 mg by mouth daily with breakfast.     [provider]  promethazine (PHENERGAN) 25 MG tablet Take 25 mg by mouth every 6 (six) hours as needed for nausea or vomiting.    [provider]  sertraline (ZOLOFT) 100 MG tablet Take 100 mg by mouth daily.    [provider]  SUMAtriptan (IMITREX) 100 MG tablet Take 100 mg by mouth every 2 (two) hours as needed for migraine or headache. 1 tab at onset of headache, may reoeat  In 2 hrs, max of 2 pills in 24 hrs 08/26/18   [provider]  vitamin B-12 (CYANOCOBALAMIN) 1000 MCG tablet Take 1,000 mcg by mouth daily.    [provider]  vitamin C (ASCORBIC ACID) 500 MG tablet Take 500 mg by mouth daily as needed (when sick).     [provider]  XALATAN 0.005 % ophthalmic solution Place 1 drop into both eyes at bedtime. 03/18/15   [provider]    Allergies    Doxycycline, Morphine and related, Zomig [zolmitriptan], Augmentin [amoxicillin-pot clavulanate], Ceftin [cefuroxime axetil], Gabapentin, Biaxin [clarithromycin], Cefdinir, and Sumatriptan  Review of Systems   Review of Systems  Constitutional: Negative for fever.  Gastrointestinal: Negative for nausea and vomiting.  Skin: Positive for wound.  Allergic/Immunologic: Negative for immunocompromised state.  Neurological: Negative for weakness and numbness.  Hematological: Does not bruise/bleed easily.  Psychiatric/Behavioral: The  patient is not nervous/anxious.     Physical Exam Updated Vital Signs BP 127/81 (BP Location: Right Arm)   Pulse 75   Temp 99.3 F (37.4 C) (Oral)   Resp 17   Ht 5\' 4"  (1.626 m)   Wt 94.3 kg   SpO2 93%   BMI 35.70 kg/m   Physical Exam Vitals and nursing note reviewed.  Constitutional:      General: She is not in acute distress.    Appearance: She is well-developed.  HENT:     Head: Normocephalic.  Eyes:     General: No scleral icterus.    Conjunctiva/sclera: Conjunctivae normal.  Cardiovascular:     Rate and Rhythm: Normal rate.  Pulmonary:     Effort: Pulmonary effort is normal.  Musculoskeletal:        General: Normal range of motion.     Cervical back: Normal range of motion.     Right knee: Normal.     Left knee: Swelling and ecchymosis present. Normal range of motion. No tenderness.       Legs:     Comments: Left hand with no active ROM of the DIP of the middle finger, active and passive ROM of the PIP and MCP. Active, but weak ROM of the DIP of the left ring finger with active and passive ROM of the PIP and MCP.   Several large open wounds - 3.0cm left ring finger and 3.5cm left long finger - see photo Sensation intact to the distal fingers.  Brisk capillary refill of the pads.  Skin:     General: Skin is warm and dry.  Neurological:     Mental Status: She is alert.             ED Results / Procedures / Treatments    Radiology DG Knee Complete 4 Views Left  Result Date: 09/17/2019 CLINICAL DATA:  Pain status post fall. EXAM: LEFT KNEE - COMPLETE 4+ VIEW COMPARISON:  None. FINDINGS: There is a trace suprapatellar joint effusion. There is prepatellar soft tissue swelling. There is no acute displaced fracture. No dislocation. There is a subtle vertical lucency coursing through the patella only visualized on the frontal view. There is soft tissue swelling about the knee. IMPRESSION: 1. Subtle vertical lucency coursing through the patient's patella only visualized on the AP view. In the setting of prepatellar soft tissue swelling, this could represent a nondisplaced fracture. Further evaluation with a sunrise view of the knee is recommended. 2. Prepatellar soft tissue swelling. 3. Small joint effusion. Electronically Signed   By: Constance Holster M.D.   On: 09/17/2019 01:35   DG Hand Complete Left  Result Date: 09/16/2019 CLINICAL DATA:  Laceration to middle and ring fingers EXAM: LEFT HAND - COMPLETE 3+ VIEW COMPARISON:  None. FINDINGS: Soft tissue laceration noted at the tip of the left middle finger. Laceration along the dorsal aspect of the middle and ring fingers distally. The DIP joint in the middle finger appears dislocated anteriorly. Small fracture fragments off the posterior aspect of the middle phalanx at the DIP joint. The 4th finger DIP joint is in a flexed position, cannot exclude extensor mechanism injury. IMPRESSION: Anterior dislocation at the middle finger DIP joint. Small fracture fragments are noted off the distal aspect of the middle phalanx posteriorly. Ring finger DIP joint is in a flexed position. No fracture visualized. Electronically Signed   By: Rolm Baptise M.D.   On: 09/16/2019 22:32   DG Knee AP/LAT W/Sunrise Right  Result Date: 09/17/2019 CLINICAL  DATA:  Follow-up abnormal x-ray. EXAM: RIGHT KNEE 3 VIEWS COMPARISON:  Knee x-ray from the same day. FINDINGS: On the single sunrise view, there is no convincing evidence for an acute displaced fracture. There is prepatellar soft tissue swelling. There are degenerative changes of the patellofemoral compartment. IMPRESSION: No definite evidence for an acute displaced fracture. Prepatellar soft tissue swelling is again noted. If there is high clinical suspicion for an occult fracture, follow-up radiographs are recommended in 10-14 days for further evaluation. Electronically Signed   By: Constance Holster M.D.   On: 09/17/2019 03:20    Procedures .Marland KitchenLaceration Repair  Date/Time: 09/17/2019 3:37 AM Performed by: Abigail Butts, PA-C Authorized by: Abigail Butts, PA-C   Consent:    Consent obtained:  Verbal   Consent given by:  Patient   Risks discussed:  Infection, need for additional repair, pain, poor cosmetic result and poor wound healing   Alternatives discussed:  No treatment and delayed treatment Universal protocol:    Procedure explained and questions answered to patient or proxy's satisfaction: yes     Relevant documents present and verified: yes     Test results available and properly labeled: yes     Imaging studies available: yes     Required blood products, implants, devices, and special equipment available: yes     Site/side marked: yes     Immediately prior to procedure, a time out was called: yes     Patient identity confirmed:  Verbally with patient Anesthesia (see MAR for exact dosages):    Anesthesia method:  Local infiltration   Local anesthetic:  Lidocaine 1% w/o epi Laceration details:    Location:  Finger   Finger location:  L long finger   Length (cm):  3.5 Repair type:    Repair type:  Intermediate Pre-procedure details:    Preparation:  Imaging obtained to evaluate for foreign bodies and patient was prepped and draped in usual sterile fashion  Exploration:    Hemostasis achieved with:  Direct pressure   Wound exploration: wound explored through full range of motion and entire depth of wound probed and visualized     Wound extent: tendon damage and underlying fracture     Tendon damage location:  Upper extremity   Upper extremity tendon damage location:  Finger extensor   Tendon damage extent:  Complete transection   Tendon repair plan:  Refer for evaluation Treatment:    Area cleansed with:  Saline   Amount of cleaning:  Extensive   Irrigation solution:  Sterile water   Irrigation method:  Syringe Skin repair:    Repair method:  Sutures   Suture size:  5-0   Suture material:  Fast-absorbing gut   Suture technique:  Simple interrupted   Number of sutures:  8 Approximation:    Approximation:  Loose Post-procedure details:    Dressing:  Non-adherent dressing   Patient tolerance of procedure:  Tolerated well, no immediate complications .Marland KitchenLaceration Repair  Date/Time: 09/17/2019 3:38 AM Performed by: Abigail Butts, PA-C Authorized by: Abigail Butts, PA-C   Consent:    Consent obtained:  Verbal   Consent given by:  Patient   Risks discussed:  Infection, need for additional repair, pain, poor cosmetic result and poor wound healing   Alternatives discussed:  No treatment and delayed treatment Universal protocol:    Procedure explained and questions answered to patient or proxy's satisfaction: yes     Relevant documents present and verified: yes  Test results available and properly labeled: yes     Imaging studies available: yes     Required blood products, implants, devices, and special equipment available: yes     Site/side marked: yes     Immediately prior to procedure, a time out was called: yes     Patient identity confirmed:  Verbally with patient Anesthesia (see MAR for exact dosages):    Anesthesia method:  Local infiltration   Local anesthetic:  Lidocaine 1% w/o epi Laceration details:     Location:  Finger   Finger location:  L ring finger   Length (cm):  3 Repair type:    Repair type:  Intermediate Pre-procedure details:    Preparation:  Patient was prepped and draped in usual sterile fashion and imaging obtained to evaluate for foreign bodies Exploration:    Hemostasis achieved with:  Direct pressure   Wound exploration: wound explored through full range of motion and entire depth of wound probed and visualized     Wound extent: tendon damage     Tendon damage location:  Upper extremity   Upper extremity tendon damage location:  Finger extensor   Tendon damage extent:  Partial transection   Tendon repair plan:  Refer for evaluation Treatment:    Area cleansed with:  Saline   Amount of cleaning:  Standard   Irrigation solution:  Sterile water   Irrigation method:  Syringe Skin repair:    Repair method:  Sutures   Suture size:  5-0   Suture material:  Fast-absorbing gut   Suture technique:  Simple interrupted   Number of sutures:  3 Approximation:    Approximation:  Loose Post-procedure details:    Dressing:  Non-adherent dressing   Patient tolerance of procedure:  Tolerated well, no immediate complications   (including critical care time)  Medications Ordered in ED Medications  Tdap (BOOSTRIX) injection 0.5 mL (0.5 mLs Intramuscular Given 09/17/19 0056)  fentaNYL (SUBLIMAZE) injection 50 mcg (50 mcg Intramuscular Given 09/17/19 0056)  lidocaine (PF) (XYLOCAINE) 1 % injection 30 mL (30 mLs Infiltration Given by Other 09/17/19 0240)    ED Course  I have reviewed the triage vital signs and the nursing notes.  Pertinent labs & imaging results that were available during my care of the patient were reviewed by me and considered in my medical decision making (see chart for details).  Clinical Course as of Sep 17 339  Fri Sep 17, 2019  0056 Discussed with Dr. Jeannie Fend, hand.  He recommends wash out and skin repair in the ED.  He will see her in clinic tomorrow.      [HM]    Clinical Course User Index [HM] Levan Aloia, Gwenlyn Perking   MDM Rules/Calculators/A&P                       Patient presents with deep lacerations over the DIP of the left middle and ring fingers after a fall.  Left middle DIP with open fracture and extensor tendon rupture.  X-ray indicates possible dislocation however clinically this appears to be more subluxed secondary to the rupture of the tendon.  I did personally evaluated the images.  Patient with active range of motion of the DIP of the ring finger however it is painful and weak giving rise to concern for partial extensor tendon rupture.  Tdap updated.  Pain control given.  Will discuss with hand surgery.  The patient was discussed with and seen by Dr. Leonette Monarch who agrees  with the treatment plan.  3:32 AM Pressure irrigation performed. Wound explored and base of wound visualized in a bloodless field without evidence of foreign body.  Extensor tendon of both fingers are shredded.  Complete laceration of the tendon on the left middle finger and partial laceration to the left ring finger. Laceration occurred < 8 hours prior to repair which was well tolerated. Tdap updated.  Pt has no comorbidities to effect normal wound healing. Pt discharged with antibiotics.  She will see hand surgery today in clinic.  Fingers bandaged and splinted. Discussed suture home care with patient and answered questions.   R knee x-ray with questionable patellar fracture.  I personally evaluated the images. Pt is able to weight bear without significant pain.  Knee immobilizer placed.  Pt is hemodynamically stable with no complaints prior to dc.   7:29 AM Pt given paper prescription for Bactrim as she has allergies to numerous other medications and Augmentin gives her diarrhea.   Final Clinical Impression(s) / ED Diagnoses Final diagnoses:  Laceration of left middle finger without foreign body without damage to nail, initial encounter  Laceration of  left ring finger without foreign body without damage to nail, initial encounter  Acute pain of left knee    Rx / DC Orders ED Discharge Orders    None       Newman Waren, Gwenlyn Perking 09/17/19 [id number]    Enid Maultsby, Jarrett Soho, PA-C 09/17/19 0729    Fatima Blank, MD 09/17/19 1712

## 2019-09-17 NOTE — Discharge Instructions (Addendum)
1. Medications: Tylenol or ibuprofen for pain, Bactrim is your antibiotic; usual home medications 2. Treatment: ice for swelling, keep wound clean with warm soap and water and keep bandage dry, do not submerge in water for 24 hours 3. Follow Up: Please see hand surgery TODAY for further evaluation of your injury. Return to the emergency department for increased redness, drainage of pus from the wound   WOUND CARE  Keep area clean and dry for 24 hours. Do not remove bandage, if applied.  After 24 hours, remove bandage and wash wound gently with mild soap and warm water. Reapply a new bandage after cleaning wound, if directed.   Continue daily cleansing with soap and water until stitches/staples are removed.  Do not apply any ointments or creams to the wound while stitches/staples are in place, as this may cause delayed healing. Return if you experience any of the following signs of infection: Swelling, redness, pus drainage, streaking, fever >101.0 F  Return if you experience excessive bleeding that does not stop after 15-20 minutes of constant, firm pressure.

## 2019-09-17 NOTE — ED Provider Notes (Signed)
Attestation: Medical screening examination/treatment/procedure(s) were conducted as a shared visit with non-physician practitioner(s) and myself.  I personally evaluated the patient during the encounter.   Briefly, the patient is a 74 y.o. female here with left hand and knee injuries due to mechanical fall.  Vitals:   09/16/19 2205 09/17/19 0351  BP: 127/81 (!) 142/80  Pulse: 75 67  Resp: 17 18  Temp: 99.3 F (37.4 C)   SpO2: 93% 96%    CONSTITUTIONAL:  well-appearing, NAD NEURO:  Alert and oriented x 3, no focal deficits EYES:  pupils equal and reactive ENT/NECK:  trachea midline, no JVD CARDIO:  reg rate, reg rhythm, well-perfused PULM:  None labored breathing GI/GU:  Abdomin non-distended MSK/SPINE:  Left hand with no active ROM of the DIP of the middle finger, active and passive ROM of the PIP and MCP. Active, but weak ROM of the DIP of the left ring finger with active and passive ROM of the PIP and MCP.   Several large open wounds - 3.0cm left ring finger and 3.5cm left long finger - see photo Sensation intact to the distal fingers.  Brisk capillary refill of the pads Left knee ecchymosis and abrasion with patellar TTP. SKIN:  no rash,  PSYCH:  Appropriate speech and behavior   EKG Interpretation  Date/Time:    Ventricular Rate:    PR Interval:    QRS Duration:   QT Interval:    QTC Calculation:   R Axis:     Text Interpretation:         Hand injury concerning for tendon rupture with fracture. Wound irrigated and closed by APP. Hand surgery follow up. Abx given. Tetanus updated.  Left patellar fracture. Knee immobilizer given. Ortho follow up.     Fatima Blank, MD 09/17/19 [phone]

## 2019-09-17 NOTE — ED Notes (Signed)
8 sutures in 3rd digit.  3 sutures 4th digit   Knee immobilizer placed left knee

## 2019-09-19 ENCOUNTER — Other Ambulatory Visit: Payer: Medicare Other

## 2019-09-20 DIAGNOSIS — S62633B Displaced fracture of distal phalanx of left middle finger, initial encounter for open fracture: Secondary | ICD-10-CM | POA: Diagnosis not present

## 2019-09-20 DIAGNOSIS — S62635B Displaced fracture of distal phalanx of left ring finger, initial encounter for open fracture: Secondary | ICD-10-CM | POA: Diagnosis not present

## 2019-09-20 DIAGNOSIS — S63295A Dislocation of distal interphalangeal joint of left ring finger, initial encounter: Secondary | ICD-10-CM | POA: Diagnosis not present

## 2019-09-20 DIAGNOSIS — S61215A Laceration without foreign body of left ring finger without damage to nail, initial encounter: Secondary | ICD-10-CM | POA: Diagnosis not present

## 2019-09-20 DIAGNOSIS — S63293A Dislocation of distal interphalangeal joint of left middle finger, initial encounter: Secondary | ICD-10-CM | POA: Diagnosis not present

## 2019-09-20 DIAGNOSIS — S61213A Laceration without foreign body of left middle finger without damage to nail, initial encounter: Secondary | ICD-10-CM | POA: Diagnosis not present

## 2019-09-20 DIAGNOSIS — S61012A Laceration without foreign body of left thumb without damage to nail, initial encounter: Secondary | ICD-10-CM | POA: Diagnosis not present

## 2019-09-21 DIAGNOSIS — S61213A Laceration without foreign body of left middle finger without damage to nail, initial encounter: Secondary | ICD-10-CM | POA: Diagnosis not present

## 2019-09-21 DIAGNOSIS — S61215A Laceration without foreign body of left ring finger without damage to nail, initial encounter: Secondary | ICD-10-CM | POA: Diagnosis not present

## 2019-09-21 DIAGNOSIS — M25562 Pain in left knee: Secondary | ICD-10-CM | POA: Diagnosis not present

## 2019-09-22 ENCOUNTER — Other Ambulatory Visit: Payer: Self-pay | Admitting: Pain Medicine

## 2019-09-22 DIAGNOSIS — M47817 Spondylosis without myelopathy or radiculopathy, lumbosacral region: Secondary | ICD-10-CM | POA: Diagnosis not present

## 2019-09-22 DIAGNOSIS — M79605 Pain in left leg: Secondary | ICD-10-CM

## 2019-09-22 DIAGNOSIS — G894 Chronic pain syndrome: Secondary | ICD-10-CM | POA: Diagnosis not present

## 2019-09-22 DIAGNOSIS — M545 Low back pain: Secondary | ICD-10-CM | POA: Diagnosis not present

## 2019-09-22 DIAGNOSIS — M329 Systemic lupus erythematosus, unspecified: Secondary | ICD-10-CM | POA: Diagnosis not present

## 2019-09-23 ENCOUNTER — Ambulatory Visit
Admission: RE | Admit: 2019-09-23 | Discharge: 2019-09-23 | Disposition: A | Discharge status: Home / Self Care | Payer: Medicare Other | Source: Ambulatory Visit | Attending: Pain Medicine | Admitting: Pain Medicine

## 2019-09-23 DIAGNOSIS — M48061 Spinal stenosis, lumbar region without neurogenic claudication: Secondary | ICD-10-CM | POA: Diagnosis not present

## 2019-09-23 DIAGNOSIS — M79605 Pain in left leg: Secondary | ICD-10-CM

## 2019-09-28 DIAGNOSIS — M25641 Stiffness of right hand, not elsewhere classified: Secondary | ICD-10-CM | POA: Diagnosis not present

## 2019-09-28 DIAGNOSIS — M79643 Pain in unspecified hand: Secondary | ICD-10-CM | POA: Diagnosis not present

## 2019-09-28 DIAGNOSIS — R768 Other specified abnormal immunological findings in serum: Secondary | ICD-10-CM | POA: Diagnosis not present

## 2019-09-28 DIAGNOSIS — M064 Inflammatory polyarthropathy: Secondary | ICD-10-CM | POA: Diagnosis not present

## 2019-09-28 DIAGNOSIS — M359 Systemic involvement of connective tissue, unspecified: Secondary | ICD-10-CM | POA: Diagnosis not present

## 2019-09-28 DIAGNOSIS — M79642 Pain in left hand: Secondary | ICD-10-CM | POA: Diagnosis not present

## 2019-10-05 DIAGNOSIS — Z4789 Encounter for other orthopedic aftercare: Secondary | ICD-10-CM | POA: Diagnosis not present

## 2019-10-05 DIAGNOSIS — M79642 Pain in left hand: Secondary | ICD-10-CM | POA: Diagnosis not present

## 2019-10-13 ENCOUNTER — Other Ambulatory Visit: Payer: Self-pay

## 2019-10-13 ENCOUNTER — Ambulatory Visit
Admission: RE | Admit: 2019-10-13 | Discharge: 2019-10-13 | Disposition: A | Discharge status: Home / Self Care | Payer: Medicare Other | Source: Ambulatory Visit | Attending: Internal Medicine | Admitting: Internal Medicine

## 2019-10-13 DIAGNOSIS — M85851 Other specified disorders of bone density and structure, right thigh: Secondary | ICD-10-CM | POA: Diagnosis not present

## 2019-10-13 DIAGNOSIS — M810 Age-related osteoporosis without current pathological fracture: Secondary | ICD-10-CM | POA: Diagnosis not present

## 2019-10-13 DIAGNOSIS — Z780 Asymptomatic menopausal state: Secondary | ICD-10-CM | POA: Diagnosis not present

## 2019-10-13 DIAGNOSIS — M8580 Other specified disorders of bone density and structure, unspecified site: Secondary | ICD-10-CM

## 2019-10-19 DIAGNOSIS — M25642 Stiffness of left hand, not elsewhere classified: Secondary | ICD-10-CM | POA: Diagnosis not present

## 2019-10-19 DIAGNOSIS — M79642 Pain in left hand: Secondary | ICD-10-CM | POA: Diagnosis not present

## 2019-10-19 DIAGNOSIS — Z4789 Encounter for other orthopedic aftercare: Secondary | ICD-10-CM | POA: Diagnosis not present

## 2019-11-02 DIAGNOSIS — M25642 Stiffness of left hand, not elsewhere classified: Secondary | ICD-10-CM | POA: Diagnosis not present

## 2019-11-09 DIAGNOSIS — M549 Dorsalgia, unspecified: Secondary | ICD-10-CM | POA: Diagnosis not present

## 2019-11-09 DIAGNOSIS — M064 Inflammatory polyarthropathy: Secondary | ICD-10-CM | POA: Diagnosis not present

## 2019-11-09 DIAGNOSIS — M1990 Unspecified osteoarthritis, unspecified site: Secondary | ICD-10-CM | POA: Diagnosis not present

## 2019-11-09 DIAGNOSIS — Z79899 Other long term (current) drug therapy: Secondary | ICD-10-CM | POA: Diagnosis not present

## 2019-11-09 DIAGNOSIS — M2550 Pain in unspecified joint: Secondary | ICD-10-CM | POA: Diagnosis not present

## 2019-11-09 DIAGNOSIS — Z4789 Encounter for other orthopedic aftercare: Secondary | ICD-10-CM | POA: Diagnosis not present

## 2019-11-10 DIAGNOSIS — H59039 Cystoid macular edema following cataract surgery, unspecified eye: Secondary | ICD-10-CM | POA: Diagnosis not present

## 2019-11-10 DIAGNOSIS — Z961 Presence of intraocular lens: Secondary | ICD-10-CM | POA: Diagnosis not present

## 2019-11-10 DIAGNOSIS — H401132 Primary open-angle glaucoma, bilateral, moderate stage: Secondary | ICD-10-CM | POA: Diagnosis not present

## 2019-11-10 DIAGNOSIS — H2511 Age-related nuclear cataract, right eye: Secondary | ICD-10-CM | POA: Diagnosis not present

## 2019-11-11 DIAGNOSIS — Z79899 Other long term (current) drug therapy: Secondary | ICD-10-CM | POA: Diagnosis not present

## 2019-11-14 ENCOUNTER — Emergency Department (HOSPITAL_COMMUNITY)
Admission: EM | Admit: 2019-11-14 | Discharge: 2019-11-14 | Disposition: A | Discharge status: Home / Self Care | Payer: Medicare Other | Attending: Emergency Medicine | Admitting: Emergency Medicine

## 2019-11-14 ENCOUNTER — Encounter (HOSPITAL_COMMUNITY): Payer: Self-pay | Admitting: Emergency Medicine

## 2019-11-14 ENCOUNTER — Other Ambulatory Visit: Payer: Self-pay

## 2019-11-14 DIAGNOSIS — G43A0 Cyclical vomiting, not intractable: Secondary | ICD-10-CM | POA: Diagnosis not present

## 2019-11-14 DIAGNOSIS — G43909 Migraine, unspecified, not intractable, without status migrainosus: Secondary | ICD-10-CM | POA: Diagnosis not present

## 2019-11-14 DIAGNOSIS — R9431 Abnormal electrocardiogram [ECG] [EKG]: Secondary | ICD-10-CM | POA: Diagnosis not present

## 2019-11-14 DIAGNOSIS — K920 Hematemesis: Secondary | ICD-10-CM | POA: Diagnosis not present

## 2019-11-14 DIAGNOSIS — R42 Dizziness and giddiness: Secondary | ICD-10-CM | POA: Diagnosis not present

## 2019-11-14 LAB — CBC
HCT: 40.5 % (ref 36.0–46.0)
Hemoglobin: 12.5 g/dL (ref 12.0–15.0)
MCH: 28.7 pg (ref 26.0–34.0)
MCHC: 30.9 g/dL (ref 30.0–36.0)
MCV: 92.9 fL (ref 80.0–100.0)
Platelets: 225 10*3/uL (ref 150–400)
RBC: 4.36 MIL/uL (ref 3.87–5.11)
RDW: 18.8 % — ABNORMAL HIGH (ref 11.5–15.5)
WBC: 8.3 10*3/uL (ref 4.0–10.5)
nRBC: 0 % (ref 0.0–0.2)

## 2019-11-14 LAB — COMPREHENSIVE METABOLIC PANEL
ALT: 19 U/L (ref 0–44)
AST: 26 U/L (ref 15–41)
Albumin: 3.8 g/dL (ref 3.5–5.0)
Alkaline Phosphatase: 63 U/L (ref 38–126)
Anion gap: 8 (ref 5–15)
BUN: 16 mg/dL (ref 8–23)
CO2: 26 mmol/L (ref 22–32)
Calcium: 11.5 mg/dL — ABNORMAL HIGH (ref 8.9–10.3)
Chloride: 110 mmol/L (ref 98–111)
Creatinine, Ser: 0.88 mg/dL (ref 0.44–1.00)
GFR calc Af Amer: >60 mL/min (ref >60)
GFR calc non Af Amer: >60 mL/min (ref >60)
Glucose, Bld: 104 mg/dL — ABNORMAL HIGH (ref 70–99)
Potassium: 4.4 mmol/L (ref 3.5–5.1)
Sodium: 144 mmol/L (ref 135–145)
Total Bilirubin: 0.8 mg/dL (ref 0.3–1.2)
Total Protein: 7.4 g/dL (ref 6.5–8.1)

## 2019-11-14 LAB — LIPASE, BLOOD: Lipase: 21 U/L (ref 11–51)

## 2019-11-14 LAB — TYPE AND SCREEN
ABO/RH(D): O NEG
Antibody Screen: NEGATIVE

## 2019-11-14 LAB — PROTIME-INR
INR: 1 (ref 0.8–1.2)
Prothrombin Time: 13.2 seconds (ref 11.4–15.2)

## 2019-11-14 LAB — POC OCCULT BLOOD, ED: Fecal Occult Bld: NEGATIVE

## 2019-11-14 MED ORDER — PANTOPRAZOLE SODIUM 40 MG IV SOLR
40.0000 mg | Freq: Once | INTRAVENOUS | Status: AC
  Administered 2019-11-14: 40 mg via INTRAVENOUS
  Filled 2019-11-14: qty 40

## 2019-11-14 MED ORDER — SODIUM CHLORIDE 0.9 % IV BOLUS
1000.0000 mL | Freq: Once | INTRAVENOUS | Status: AC
  Administered 2019-11-14: 1000 mL via INTRAVENOUS

## 2019-11-14 MED ORDER — FENTANYL CITRATE (PF) 100 MCG/2ML IJ SOLN
50.0000 ug | Freq: Once | INTRAMUSCULAR | Status: AC
  Administered 2019-11-14: 50 ug via INTRAVENOUS
  Filled 2019-11-14: qty 2

## 2019-11-14 MED ORDER — METOCLOPRAMIDE HCL 10 MG PO TABS
10.0000 mg | ORAL_TABLET | Freq: Once | ORAL | Status: AC
  Administered 2019-11-14: 10 mg via ORAL
  Filled 2019-11-14: qty 1

## 2019-11-14 MED ORDER — METOCLOPRAMIDE HCL 5 MG/ML IJ SOLN
10.0000 mg | Freq: Once | INTRAMUSCULAR | Status: AC
  Administered 2019-11-14: 10 mg via INTRAVENOUS
  Filled 2019-11-14: qty 2

## 2019-11-14 MED ORDER — SODIUM CHLORIDE 0.9% FLUSH
3.0000 mL | Freq: Once | INTRAVENOUS | Status: AC
  Administered 2019-11-14: 3 mL via INTRAVENOUS

## 2019-11-14 MED ORDER — METOCLOPRAMIDE HCL 10 MG PO TABS
10.0000 mg | ORAL_TABLET | Freq: Four times a day (QID) | ORAL | 0 refills | Status: AC | PRN
Start: 2019-11-14 — End: ?

## 2019-11-14 NOTE — Discharge Instructions (Addendum)
You were seen in the emergency department for evaluation of vomiting black material in the setting of a migraine headache.  You had blood work that showed your red blood cell count to be stable.  There is no evidence of any active bleeding.  Your symptoms improved with some medication.  Please contact your GI doctor tomorrow for close follow-up.  If you experience bleeding again or any other concerns return to the emergency department.

## 2019-11-14 NOTE — ED Provider Notes (Signed)
[facility] DEPT Provider Note   CSN: [id number] Arrival date & time: 11/14/19  1428     History Chief Complaint  Patient presents with  . Hematemesis    Lynn Jenkins is a 74 y.o. female.  She is complaining of a migraine headache that started about a week ago associated with nausea and vomiting.  She has been vomiting fairly regularly for the last 3 days.  Has not been able to take much by mouth.  Around 2:30 AM this morning she was vomiting and noticed that it was black.  She has never had this happen before.  No chest pain or abdominal pain.  Denies smoking alcohol or frequent NSAIDs.  Not on any blood thinners.  Complaining of feeling lightheaded.  Denies shortness of breath.  The history is provided by the patient.  Emesis Severity:  Moderate Timing:  Intermittent Quality:  Coffee grounds Progression:  Worsening Chronicity:  New Relieved by:  Nothing Worsened by:  Nothing Ineffective treatments:  None tried Associated symptoms: headaches   Associated symptoms: no abdominal pain, no chills, no cough, no fever and no sore throat        Past Medical History:  Diagnosis Date  . Anemia   . Arthritis   . Generalized anxiety disorder   . GERD (gastroesophageal reflux disease)   . Glaucoma   . Headache    hx of migraines   . History of hiatal hernia   . Hyperthyroidism   . Migraine   . Mixed hyperlipidemia   . Multinodular goiter   . Osteopenia   . Primary hyperparathyroidism ([place])   . Varicose veins   . Vitamin D deficiency     Patient Active Problem List   Diagnosis Date Noted  . Leukocytosis 08/18/2019  . Microcytic anemia 08/18/2019  . Primary hyperparathyroidism ([place]) 11/01/2014  . Hyperparathyroidism, primary ([place]) 10/31/2014  . Varicose veins of lower extremities with other complications 69/67/8938    Past Surgical History:  Procedure Laterality Date  . ABDOMINAL HYSTERECTOMY    . APPENDECTOMY    . JOINT REPLACEMENT      . left foot surgery     . PARATHYROIDECTOMY N/A 11/01/2014   Procedure: NECK EXPLORATION AND PARATHYROIDECTOMY;  Surgeon: Armandina Gemma, MD;  Location: [facility] ORS;  Service: General;  Laterality: N/A;  . SHOULDER SURGERY       OB History   No obstetric history on file.     Family History  Problem Relation Age of Onset  . Heart disease Mother   . Hyperlipidemia Mother   . Hypertension Mother   . Heart disease Father   . Hyperlipidemia Father   . Hypertension Father   . Cancer Brother   . Hypertension Brother     Social History   Tobacco Use  . Smoking status: Never Smoker  . Smokeless tobacco: Never Used  Substance Use Topics  . Alcohol use: No  . Drug use: No    Home Medications Prior to Admission medications   Medication Sig Start Date End Date Taking? Authorizing Provider  acetaminophen (TYLENOL) 500 MG tablet Take 500-1,000 mg by mouth every 6 (six) hours as needed for mild pain or moderate pain.    [provider]  brimonidine (ALPHAGAN) 0.2 % ophthalmic solution Place 1 drop into the left eye daily.    [provider]  Bromfenac Sodium (PROLENSA) 0.07 % SOLN Place 1 drop into the left eye daily.    [provider]  cholecalciferol (VITAMIN D)  1000 UNITS tablet Take 1,000 Units by mouth daily.    [provider]  co-enzyme Q-10 30 MG capsule Take 30 mg by mouth daily.    [provider]  Difluprednate (DUREZOL) 0.05 % EMUL Place 1 drop into the left eye in the morning, at noon, and at bedtime.    [provider]  esomeprazole (NEXIUM) 40 MG capsule Take 40 mg by mouth 2 (two) times daily before a meal.     [provider]  fenofibrate 160 MG tablet Take 160 mg by mouth daily.    [provider]  ferrous sulfate 325 (65 FE) MG tablet Take 325 mg by mouth daily with breakfast.    [provider]  furosemide (LASIX) 20 MG tablet Take 20 mg by mouth daily as needed for fluid.  08/26/18   [provider]  Glucosamine-Chondroit-Vit C-Mn (GLUCOSAMINE 1500 COMPLEX) CAPS Take 1 capsule by mouth 2 (two) times daily.     [provider]  Eloise Levels, [name], (GREEN TEA PO) Take 1 capsule by mouth daily.    [provider]  HYDROcodone-acetaminophen (NORCO/VICODIN) 5-325 MG tablet Take 1 tablet by mouth every 6 (six) hours as needed for moderate pain or severe pain.  08/23/19   [provider]  hydroxychloroquine (PLAQUENIL) 200 MG tablet Take 400 mg by mouth daily.    [provider]  meloxicam (MOBIC) 15 MG tablet Take 15 mg by mouth daily.     [provider]  Misc Natural Products (TOTAL MEMORY & FOCUS FORMULA PO) Take 2 tablets by mouth daily.    [provider]  Multiple Vitamin (MULTIVITAMIN WITH MINERALS) TABS tablet Take 1 tablet by mouth daily.    [provider]  Netarsudil Dimesylate (RHOPRESSA) 0.02 % SOLN Place 1 drop into the left eye at bedtime.    [provider]  Omega 3 1000 MG CAPS Take 2,000 mg by mouth 2 (two) times daily.    [provider]  ondansetron (ZOFRAN ODT) 4 MG disintegrating tablet Take 1 tablet (4 mg total) by mouth every 8 (eight) hours as needed for nausea or vomiting. 12/18/17   [name], MD  predniSONE (DELTASONE) 5 MG tablet Take 2.5 mg by mouth daily with breakfast.     [provider]  promethazine (PHENERGAN) 25 MG tablet Take 25 mg by mouth every 6 (six) hours as needed for nausea or vomiting.    [provider]  sertraline (ZOLOFT) 100 MG tablet Take 100 mg by mouth daily.    [provider]  SUMAtriptan (IMITREX) 100 MG tablet Take 100 mg by mouth every 2 (two) hours as needed for migraine or headache. 1 tab at onset of headache, may reoeat  In 2 hrs, max of 2 pills in 24 hrs 08/26/18   [provider]  vitamin B-12 (CYANOCOBALAMIN) 1000 MCG tablet Take 1,000 mcg by mouth daily.    [provider]  vitamin C  (ASCORBIC ACID) 500 MG tablet Take 500 mg by mouth daily as needed (when sick).     [provider]  XALATAN 0.005 % ophthalmic solution Place 1 drop into both eyes at bedtime. 03/18/15   [provider]    Allergies    Doxycycline, Morphine and related, Zomig [zolmitriptan], Augmentin [amoxicillin-pot clavulanate], Ceftin [cefuroxime axetil], Gabapentin, Biaxin [clarithromycin], Cefdinir, and Sumatriptan  Review of Systems   Review of Systems  Constitutional: Negative for chills and fever.  HENT: Negative for sore throat.  Eyes: Negative for visual disturbance.  Respiratory: Negative for cough and shortness of breath.   Cardiovascular: Negative for chest pain.  Gastrointestinal: Positive for nausea and vomiting. Negative for abdominal pain.  Genitourinary: Negative for dysuria.  Musculoskeletal: Negative for neck pain.  Skin: Negative for rash.  Neurological: Positive for light-headedness and headaches.    Physical Exam Updated Vital Signs BP (!) 150/88 (BP Location: Left Arm)   Pulse 65   Temp 98.5 F (36.9 C) (Oral)   Resp 16   SpO2 92%   Physical Exam Vitals and nursing note reviewed.  Constitutional:      General: She is not in acute distress.    Appearance: She is well-developed.  HENT:     Head: Normocephalic and atraumatic.  Eyes:     Conjunctiva/sclera: Conjunctivae normal.  Cardiovascular:     Rate and Rhythm: Normal rate and regular rhythm.     Pulses: Normal pulses.     Heart sounds: No murmur.  Pulmonary:     Effort: Pulmonary effort is normal. No respiratory distress.     Breath sounds: Normal breath sounds.  Abdominal:     Palpations: Abdomen is soft.     Tenderness: There is no abdominal tenderness. There is no guarding or rebound.  Musculoskeletal:        General: No deformity or signs of injury. Normal range of motion.     Cervical back: Neck supple.  Skin:    General: Skin is warm and dry.     Capillary Refill: Capillary  refill takes less than 2 seconds.  Neurological:     General: No focal deficit present.     Mental Status: She is alert.     Gait: Gait normal.     ED Results / Procedures / Treatments   Labs (all labs ordered are listed, but only abnormal results are displayed) Labs Reviewed  COMPREHENSIVE METABOLIC PANEL - Abnormal; Notable for the following components:      Result Value   Glucose, Bld 104 (*)    Calcium 11.5 (*)    All other components within normal limits  CBC - Abnormal; Notable for the following components:   RDW 18.8 (*)    All other components within normal limits  LIPASE, BLOOD  PROTIME-INR  POC OCCULT BLOOD, ED  TYPE AND SCREEN  ABO/RH    EKG EKG Interpretation  Date/Time:  Sunday November 14 2019 15:22:57 EDT Ventricular Rate:  67 PR Interval:    QRS Duration: 114 QT Interval:  418 QTC Calculation: 442 R Axis:   34 Text Interpretation: Sinus rhythm Borderline intraventricular conduction delay Abnormal R-wave progression, early transition No significant change since prior 5/07 Confirmed by Aletta Edouard [phone]) on 11/14/2019 3:27:06 PM   Radiology No results found.  Procedures Procedures (including critical care time)  Medications Ordered in ED Medications  sodium chloride flush (NS) 0.9 % injection 3 mL (3 mLs Intravenous Given 11/14/19 1454)  pantoprazole (PROTONIX) injection 40 mg (40 mg Intravenous Given 11/14/19 1510)  metoCLOPramide (REGLAN) injection 10 mg (10 mg Intravenous Given 11/14/19 1510)  sodium chloride 0.9 % bolus 1,000 mL (0 mLs Intravenous Stopped 11/14/19 1600)  fentaNYL (SUBLIMAZE) injection 50 mcg (50 mcg Intravenous Given 11/14/19 1602)  metoCLOPramide (REGLAN) tablet 10 mg (10 mg Oral Given 11/14/19 1731)    ED Course  I have reviewed the triage vital signs and the nursing notes.  Pertinent labs & imaging results that were available during my care of the patient were reviewed by  me and considered in my medical decision making (see chart for  details).  Clinical Course as of Nov 15 1050  Sun Nov 14, 2019  1552 Rectal exam done with tech [place] as chaperone.  Normal sphincter tone no gross blood.  Sample sent for guaiac   [MB]  0630 Patient states headache is improved and nausea has resolved. Patient follows with Dr. Penelope Coop from gastroenterology. Will page them for recommendations.   [MB]  1601 Discussed with Dr. Stacie Glaze GI.  He feels with her way her vital signs and her labs are that she can be safely discharged and have her call the office first thing in the morning to arrange close follow-up.  I will review this with the patient.   [MB]    Clinical Course User Index [MB] Hayden Rasmussen, MD   MDM Rules/Calculators/A&P                     This patient complains of migraine headache, vomiting, possible coffee-ground emesis; this involves an extensive number of treatment Options and is a complaint that carries with it a high risk of complications and Morbidity. The differential includes migraine, gastritis, peptic ulcer disease, significant GI bleed, Mallory-Weiss tear  I ordered, reviewed and interpreted labs, which included CBC showing a normal white count normal hemoglobin, normal platelets, chemistries with normal BUN/creatinine, elevated calcium seen prior.  Normal coags.  Fecal occult negative. I ordered medication nausea medication pain medication and IV PPI Previous records obtained and reviewed in epic, she has seen Dr. Penelope Coop gastroenterology in the past.  No prior upper GI bleeding I consulted Dr. Paulita Fujita gastroenterology and discussed lab and imaging findings  Critical Interventions: None  After the interventions stated above, I reevaluated the patient and found patient to be hemodynamically stable and symptomatically improved.  I reviewed GI recommendations with her and she is comfortable with plan for following up as an outpatient.  She is already on a PPI.  Not on anticoagulation.  Return instructions  discussed.   Final Clinical Impression(s) / ED Diagnoses Final diagnoses:  Migraine without status migrainosus, not intractable, unspecified migraine type  Cyclical vomiting associated with nonintractable migraine    Rx / DC Orders ED Discharge Orders    None       Hayden Rasmussen, MD 11/15/19 1055

## 2019-11-14 NOTE — ED Triage Notes (Signed)
Pt c/o feeling bloated and vomiting black since 3am. Denies taking blood thinners

## 2019-11-15 LAB — ABO/RH: ABO/RH(D): O NEG

## 2019-11-18 DIAGNOSIS — M79642 Pain in left hand: Secondary | ICD-10-CM | POA: Diagnosis not present

## 2019-11-25 ENCOUNTER — Other Ambulatory Visit: Payer: Medicare Other

## 2019-11-25 ENCOUNTER — Ambulatory Visit: Payer: Medicare Other | Admitting: Family

## 2019-11-26 ENCOUNTER — Inpatient Hospital Stay: Payer: Medicare Other | Admitting: Family

## 2019-11-26 ENCOUNTER — Inpatient Hospital Stay: Payer: Medicare Other

## 2019-11-29 ENCOUNTER — Other Ambulatory Visit: Payer: Medicare Other

## 2019-11-29 ENCOUNTER — Ambulatory Visit: Payer: Medicare Other | Admitting: Family

## 2019-11-29 DIAGNOSIS — M79642 Pain in left hand: Secondary | ICD-10-CM | POA: Diagnosis not present

## 2019-12-08 DIAGNOSIS — M79642 Pain in left hand: Secondary | ICD-10-CM | POA: Diagnosis not present

## 2019-12-08 DIAGNOSIS — Z4789 Encounter for other orthopedic aftercare: Secondary | ICD-10-CM | POA: Diagnosis not present

## 2019-12-09 ENCOUNTER — Other Ambulatory Visit: Payer: Medicare Other

## 2019-12-09 ENCOUNTER — Ambulatory Visit: Payer: Medicare Other | Admitting: Family

## 2019-12-10 DIAGNOSIS — N390 Urinary tract infection, site not specified: Secondary | ICD-10-CM | POA: Diagnosis not present

## 2019-12-10 DIAGNOSIS — E210 Primary hyperparathyroidism: Secondary | ICD-10-CM | POA: Diagnosis not present

## 2019-12-10 DIAGNOSIS — E1165 Type 2 diabetes mellitus with hyperglycemia: Secondary | ICD-10-CM | POA: Diagnosis not present

## 2019-12-14 ENCOUNTER — Other Ambulatory Visit: Payer: Self-pay | Admitting: Endocrinology

## 2019-12-14 ENCOUNTER — Other Ambulatory Visit (HOSPITAL_COMMUNITY): Payer: Self-pay | Admitting: Endocrinology

## 2019-12-14 DIAGNOSIS — E210 Primary hyperparathyroidism: Secondary | ICD-10-CM

## 2019-12-17 DIAGNOSIS — K219 Gastro-esophageal reflux disease without esophagitis: Secondary | ICD-10-CM | POA: Diagnosis not present

## 2019-12-17 DIAGNOSIS — K920 Hematemesis: Secondary | ICD-10-CM | POA: Diagnosis not present

## 2019-12-17 DIAGNOSIS — R112 Nausea with vomiting, unspecified: Secondary | ICD-10-CM | POA: Diagnosis not present

## 2019-12-17 DIAGNOSIS — D509 Iron deficiency anemia, unspecified: Secondary | ICD-10-CM | POA: Diagnosis not present

## 2019-12-23 ENCOUNTER — Encounter (HOSPITAL_COMMUNITY): Payer: Medicare Other

## 2019-12-27 ENCOUNTER — Other Ambulatory Visit (HOSPITAL_COMMUNITY): Payer: Medicare Other

## 2019-12-28 ENCOUNTER — Encounter (HOSPITAL_COMMUNITY): Payer: Medicare Other

## 2019-12-28 ENCOUNTER — Other Ambulatory Visit (HOSPITAL_COMMUNITY): Payer: Medicare Other

## 2020-01-03 ENCOUNTER — Other Ambulatory Visit: Payer: Self-pay

## 2020-01-03 ENCOUNTER — Encounter (HOSPITAL_COMMUNITY)
Admission: RE | Admit: 2020-01-03 | Discharge: 2020-01-03 | Disposition: A | Discharge status: Home / Self Care | Payer: Medicare Other | Source: Ambulatory Visit | Attending: Endocrinology | Admitting: Endocrinology

## 2020-01-03 DIAGNOSIS — D351 Benign neoplasm of parathyroid gland: Secondary | ICD-10-CM | POA: Diagnosis not present

## 2020-01-03 DIAGNOSIS — E210 Primary hyperparathyroidism: Secondary | ICD-10-CM | POA: Diagnosis not present

## 2020-01-03 MED ORDER — TECHNETIUM TC 99M SESTAMIBI - CARDIOLITE
29.6000 | Freq: Once | INTRAVENOUS | Status: AC | PRN
  Administered 2020-01-03: 10:00:00 29.6 via INTRAVENOUS

## 2020-01-05 DIAGNOSIS — M79672 Pain in left foot: Secondary | ICD-10-CM | POA: Diagnosis not present

## 2020-01-05 DIAGNOSIS — S8002XA Contusion of left knee, initial encounter: Secondary | ICD-10-CM | POA: Diagnosis not present

## 2020-01-05 DIAGNOSIS — M25511 Pain in right shoulder: Secondary | ICD-10-CM | POA: Diagnosis not present

## 2020-01-05 DIAGNOSIS — M25562 Pain in left knee: Secondary | ICD-10-CM | POA: Diagnosis not present

## 2020-01-07 DIAGNOSIS — S0083XA Contusion of other part of head, initial encounter: Secondary | ICD-10-CM | POA: Diagnosis not present

## 2020-01-07 DIAGNOSIS — H59039 Cystoid macular edema following cataract surgery, unspecified eye: Secondary | ICD-10-CM | POA: Diagnosis not present

## 2020-01-07 DIAGNOSIS — H401123 Primary open-angle glaucoma, left eye, severe stage: Secondary | ICD-10-CM | POA: Diagnosis not present

## 2020-01-07 DIAGNOSIS — H401112 Primary open-angle glaucoma, right eye, moderate stage: Secondary | ICD-10-CM | POA: Diagnosis not present

## 2020-01-10 DIAGNOSIS — M1990 Unspecified osteoarthritis, unspecified site: Secondary | ICD-10-CM | POA: Diagnosis not present

## 2020-01-10 DIAGNOSIS — M064 Inflammatory polyarthropathy: Secondary | ICD-10-CM | POA: Diagnosis not present

## 2020-01-10 DIAGNOSIS — M549 Dorsalgia, unspecified: Secondary | ICD-10-CM | POA: Diagnosis not present

## 2020-01-10 DIAGNOSIS — M2550 Pain in unspecified joint: Secondary | ICD-10-CM | POA: Diagnosis not present

## 2020-01-12 DIAGNOSIS — H59032 Cystoid macular edema following cataract surgery, left eye: Secondary | ICD-10-CM | POA: Diagnosis not present

## 2020-01-12 DIAGNOSIS — Z961 Presence of intraocular lens: Secondary | ICD-10-CM | POA: Diagnosis not present

## 2020-01-12 DIAGNOSIS — H2511 Age-related nuclear cataract, right eye: Secondary | ICD-10-CM | POA: Diagnosis not present

## 2020-01-12 DIAGNOSIS — H401132 Primary open-angle glaucoma, bilateral, moderate stage: Secondary | ICD-10-CM | POA: Diagnosis not present

## 2020-01-21 ENCOUNTER — Other Ambulatory Visit: Payer: Self-pay | Admitting: Surgery

## 2020-01-21 DIAGNOSIS — E041 Nontoxic single thyroid nodule: Secondary | ICD-10-CM

## 2020-02-02 ENCOUNTER — Ambulatory Visit
Admission: RE | Admit: 2020-02-02 | Discharge: 2020-02-02 | Disposition: A | Discharge status: Home / Self Care | Payer: Medicare Other | Source: Ambulatory Visit | Attending: Surgery | Admitting: Surgery

## 2020-02-02 DIAGNOSIS — E041 Nontoxic single thyroid nodule: Secondary | ICD-10-CM

## 2020-02-02 DIAGNOSIS — E042 Nontoxic multinodular goiter: Secondary | ICD-10-CM | POA: Diagnosis not present

## 2020-02-03 ENCOUNTER — Other Ambulatory Visit: Payer: Medicare Other

## 2020-03-07 DIAGNOSIS — Z9889 Other specified postprocedural states: Secondary | ICD-10-CM | POA: Diagnosis not present

## 2020-03-07 DIAGNOSIS — E210 Primary hyperparathyroidism: Secondary | ICD-10-CM | POA: Diagnosis not present

## 2020-03-08 ENCOUNTER — Other Ambulatory Visit: Payer: Self-pay | Admitting: Surgery

## 2020-03-08 DIAGNOSIS — E210 Primary hyperparathyroidism: Secondary | ICD-10-CM

## 2020-03-10 DIAGNOSIS — Z4789 Encounter for other orthopedic aftercare: Secondary | ICD-10-CM | POA: Diagnosis not present

## 2020-03-10 DIAGNOSIS — M79642 Pain in left hand: Secondary | ICD-10-CM | POA: Diagnosis not present

## 2020-03-31 ENCOUNTER — Ambulatory Visit
Admission: RE | Admit: 2020-03-31 | Discharge: 2020-03-31 | Disposition: A | Discharge status: Home / Self Care | Payer: Medicare Other | Source: Ambulatory Visit | Attending: Surgery | Admitting: Surgery

## 2020-03-31 DIAGNOSIS — H2511 Age-related nuclear cataract, right eye: Secondary | ICD-10-CM | POA: Diagnosis not present

## 2020-03-31 DIAGNOSIS — E213 Hyperparathyroidism, unspecified: Secondary | ICD-10-CM | POA: Diagnosis not present

## 2020-03-31 DIAGNOSIS — H401132 Primary open-angle glaucoma, bilateral, moderate stage: Secondary | ICD-10-CM | POA: Diagnosis not present

## 2020-03-31 DIAGNOSIS — H59032 Cystoid macular edema following cataract surgery, left eye: Secondary | ICD-10-CM | POA: Diagnosis not present

## 2020-03-31 DIAGNOSIS — E210 Primary hyperparathyroidism: Secondary | ICD-10-CM

## 2020-03-31 DIAGNOSIS — Z961 Presence of intraocular lens: Secondary | ICD-10-CM | POA: Diagnosis not present

## 2020-03-31 MED ORDER — IOPAMIDOL (ISOVUE-300) INJECTION 61%
60.0000 mL | Freq: Once | INTRAVENOUS | Status: AC | PRN
  Administered 2020-03-31: 60 mL via INTRAVENOUS

## 2020-04-10 DIAGNOSIS — R059 Cough, unspecified: Secondary | ICD-10-CM | POA: Diagnosis not present

## 2020-04-10 DIAGNOSIS — R110 Nausea: Secondary | ICD-10-CM | POA: Diagnosis not present

## 2020-04-10 DIAGNOSIS — R9389 Abnormal findings on diagnostic imaging of other specified body structures: Secondary | ICD-10-CM | POA: Diagnosis not present

## 2020-04-10 DIAGNOSIS — Z23 Encounter for immunization: Secondary | ICD-10-CM | POA: Diagnosis not present

## 2020-04-11 ENCOUNTER — Other Ambulatory Visit: Payer: Self-pay | Admitting: Family Medicine

## 2020-04-11 DIAGNOSIS — M1990 Unspecified osteoarthritis, unspecified site: Secondary | ICD-10-CM | POA: Diagnosis not present

## 2020-04-11 DIAGNOSIS — R059 Cough, unspecified: Secondary | ICD-10-CM

## 2020-04-11 DIAGNOSIS — M064 Inflammatory polyarthropathy: Secondary | ICD-10-CM | POA: Diagnosis not present

## 2020-04-11 DIAGNOSIS — M2550 Pain in unspecified joint: Secondary | ICD-10-CM | POA: Diagnosis not present

## 2020-04-11 DIAGNOSIS — R9389 Abnormal findings on diagnostic imaging of other specified body structures: Secondary | ICD-10-CM

## 2020-04-11 DIAGNOSIS — M549 Dorsalgia, unspecified: Secondary | ICD-10-CM | POA: Diagnosis not present

## 2020-04-26 ENCOUNTER — Ambulatory Visit
Admission: RE | Admit: 2020-04-26 | Discharge: 2020-04-26 | Disposition: A | Discharge status: Home / Self Care | Payer: Medicare Other | Source: Ambulatory Visit | Attending: Family Medicine | Admitting: Family Medicine

## 2020-04-26 DIAGNOSIS — R9389 Abnormal findings on diagnostic imaging of other specified body structures: Secondary | ICD-10-CM

## 2020-04-26 DIAGNOSIS — R059 Cough, unspecified: Secondary | ICD-10-CM

## 2020-04-26 DIAGNOSIS — E210 Primary hyperparathyroidism: Secondary | ICD-10-CM | POA: Diagnosis not present

## 2020-05-15 ENCOUNTER — Other Ambulatory Visit: Payer: Self-pay

## 2020-05-15 ENCOUNTER — Other Ambulatory Visit (HOSPITAL_COMMUNITY): Payer: Self-pay | Admitting: Family Medicine

## 2020-05-15 ENCOUNTER — Ambulatory Visit (HOSPITAL_COMMUNITY)
Admission: RE | Admit: 2020-05-15 | Discharge: 2020-05-15 | Disposition: A | Discharge status: Home / Self Care | Payer: Medicare Other | Source: Ambulatory Visit | Attending: Surgery | Admitting: Surgery

## 2020-05-15 DIAGNOSIS — M79604 Pain in right leg: Secondary | ICD-10-CM | POA: Diagnosis not present

## 2020-05-15 DIAGNOSIS — M7989 Other specified soft tissue disorders: Secondary | ICD-10-CM | POA: Insufficient documentation

## 2020-05-15 DIAGNOSIS — B372 Candidiasis of skin and nail: Secondary | ICD-10-CM | POA: Diagnosis not present

## 2020-05-25 DIAGNOSIS — R9389 Abnormal findings on diagnostic imaging of other specified body structures: Secondary | ICD-10-CM | POA: Diagnosis not present

## 2020-05-25 DIAGNOSIS — M7989 Other specified soft tissue disorders: Secondary | ICD-10-CM | POA: Diagnosis not present

## 2020-05-25 DIAGNOSIS — L0390 Cellulitis, unspecified: Secondary | ICD-10-CM | POA: Diagnosis not present

## 2020-05-26 ENCOUNTER — Ambulatory Visit
Admission: RE | Admit: 2020-05-26 | Discharge: 2020-05-26 | Disposition: A | Discharge status: Home / Self Care | Payer: Medicare Other | Source: Ambulatory Visit | Attending: Family Medicine | Admitting: Family Medicine

## 2020-05-26 ENCOUNTER — Other Ambulatory Visit: Payer: Self-pay | Admitting: Family Medicine

## 2020-05-26 DIAGNOSIS — Z8701 Personal history of pneumonia (recurrent): Secondary | ICD-10-CM | POA: Diagnosis not present

## 2020-05-26 DIAGNOSIS — K449 Diaphragmatic hernia without obstruction or gangrene: Secondary | ICD-10-CM | POA: Diagnosis not present

## 2020-05-26 DIAGNOSIS — R9389 Abnormal findings on diagnostic imaging of other specified body structures: Secondary | ICD-10-CM

## 2020-06-14 DIAGNOSIS — Z0000 Encounter for general adult medical examination without abnormal findings: Secondary | ICD-10-CM | POA: Diagnosis not present

## 2020-06-14 DIAGNOSIS — N183 Chronic kidney disease, stage 3 unspecified: Secondary | ICD-10-CM | POA: Diagnosis not present

## 2020-06-14 DIAGNOSIS — E559 Vitamin D deficiency, unspecified: Secondary | ICD-10-CM | POA: Diagnosis not present

## 2020-06-14 DIAGNOSIS — G43009 Migraine without aura, not intractable, without status migrainosus: Secondary | ICD-10-CM | POA: Diagnosis not present

## 2020-06-28 DIAGNOSIS — H59039 Cystoid macular edema following cataract surgery, unspecified eye: Secondary | ICD-10-CM | POA: Diagnosis not present

## 2020-06-28 DIAGNOSIS — H2511 Age-related nuclear cataract, right eye: Secondary | ICD-10-CM | POA: Diagnosis not present

## 2020-06-28 DIAGNOSIS — H401132 Primary open-angle glaucoma, bilateral, moderate stage: Secondary | ICD-10-CM | POA: Diagnosis not present

## 2020-06-28 DIAGNOSIS — Z961 Presence of intraocular lens: Secondary | ICD-10-CM | POA: Diagnosis not present

## 2020-07-17 DIAGNOSIS — R21 Rash and other nonspecific skin eruption: Secondary | ICD-10-CM | POA: Diagnosis not present

## 2020-07-17 DIAGNOSIS — L03115 Cellulitis of right lower limb: Secondary | ICD-10-CM | POA: Diagnosis not present

## 2020-07-21 DIAGNOSIS — L97911 Non-pressure chronic ulcer of unspecified part of right lower leg limited to breakdown of skin: Secondary | ICD-10-CM | POA: Diagnosis not present

## 2020-07-21 DIAGNOSIS — R21 Rash and other nonspecific skin eruption: Secondary | ICD-10-CM | POA: Diagnosis not present

## 2020-08-04 DIAGNOSIS — R42 Dizziness and giddiness: Secondary | ICD-10-CM | POA: Diagnosis not present

## 2020-08-04 DIAGNOSIS — L98499 Non-pressure chronic ulcer of skin of other sites with unspecified severity: Secondary | ICD-10-CM | POA: Diagnosis not present

## 2020-08-04 DIAGNOSIS — R21 Rash and other nonspecific skin eruption: Secondary | ICD-10-CM | POA: Diagnosis not present

## 2020-08-04 DIAGNOSIS — M792 Neuralgia and neuritis, unspecified: Secondary | ICD-10-CM | POA: Diagnosis not present

## 2020-08-09 DIAGNOSIS — M2550 Pain in unspecified joint: Secondary | ICD-10-CM | POA: Diagnosis not present

## 2020-08-09 DIAGNOSIS — M1990 Unspecified osteoarthritis, unspecified site: Secondary | ICD-10-CM | POA: Diagnosis not present

## 2020-08-09 DIAGNOSIS — M064 Inflammatory polyarthropathy: Secondary | ICD-10-CM | POA: Diagnosis not present

## 2020-08-09 DIAGNOSIS — M549 Dorsalgia, unspecified: Secondary | ICD-10-CM | POA: Diagnosis not present

## 2020-08-17 DIAGNOSIS — E210 Primary hyperparathyroidism: Secondary | ICD-10-CM | POA: Diagnosis not present

## 2020-09-01 ENCOUNTER — Other Ambulatory Visit (HOSPITAL_COMMUNITY): Payer: Self-pay | Admitting: Surgery

## 2020-09-01 ENCOUNTER — Other Ambulatory Visit (HOSPITAL_COMMUNITY): Payer: Self-pay | Admitting: Thoracic Diseases

## 2020-09-01 DIAGNOSIS — L97919 Non-pressure chronic ulcer of unspecified part of right lower leg with unspecified severity: Secondary | ICD-10-CM

## 2020-09-05 ENCOUNTER — Other Ambulatory Visit: Payer: Self-pay

## 2020-09-05 ENCOUNTER — Ambulatory Visit (HOSPITAL_COMMUNITY)
Admission: RE | Admit: 2020-09-05 | Discharge: 2020-09-05 | Disposition: A | Discharge status: Home / Self Care | Payer: Medicare Other | Source: Ambulatory Visit | Attending: Family Medicine | Admitting: Family Medicine

## 2020-09-05 DIAGNOSIS — L97929 Non-pressure chronic ulcer of unspecified part of left lower leg with unspecified severity: Secondary | ICD-10-CM

## 2020-09-05 DIAGNOSIS — L97919 Non-pressure chronic ulcer of unspecified part of right lower leg with unspecified severity: Secondary | ICD-10-CM

## 2020-09-06 DIAGNOSIS — M2550 Pain in unspecified joint: Secondary | ICD-10-CM | POA: Diagnosis not present

## 2020-09-06 DIAGNOSIS — M064 Inflammatory polyarthropathy: Secondary | ICD-10-CM | POA: Diagnosis not present

## 2020-09-06 DIAGNOSIS — M549 Dorsalgia, unspecified: Secondary | ICD-10-CM | POA: Diagnosis not present

## 2020-09-06 DIAGNOSIS — M1990 Unspecified osteoarthritis, unspecified site: Secondary | ICD-10-CM | POA: Diagnosis not present

## 2020-09-13 ENCOUNTER — Ambulatory Visit: Payer: Self-pay | Admitting: Surgery

## 2020-09-13 NOTE — H&P (Signed)
General Surgery [facility]  Lynn Jenkins DOB: March 17, 1946 Married / Language: English / Race: White Female   History of Present Illness  The patient is a 75 year old female who presents with a parathyroid neoplasm.  CHIEF COMPLAINT: persistent primary hyperparathyroidism  Patient returns to my practice for persistent primary hyperparathyroidism. Patient had undergone nuclear medicine parathyroid scan in July 2021. This localized a left inferior parathyroid adenoma. This area had been evaluated during her neck exploration in 2016. In an attempt to confirm the presence of an adenoma in the left inferior position, I obtained an ultrasound examination of the neck as well as a 4D CT scan of the neck. Neither study revealed evidence of a left inferior parathyroid adenoma. However, as an incidental finding, there was an abnormality of the lung. Patient is now undergoing evaluation which will include a chest CT scan later this afternoon. We will be awaiting the results of those studies.   Problem List/Past Medical MULTIPLE THYROID NODULES (E04.2)  ANXIETY (F41.9)  HISTORY OF PARATHYROID SURGERY (Z98.890)  PRIMARY HYPERPARATHYROIDISM (E21.0)   Past Surgical History Appendectomy  Cataract Surgery  Left. Colon Polyp Removal - Colonoscopy  Foot Surgery  Bilateral. Hysterectomy (not due to cancer) - Partial  Mammoplasty; Reduction  Bilateral. Shoulder Surgery  Right. Thyroid Surgery   Diagnostic Studies History  Colonoscopy  5-10 years ago 1-5 years ago Mammogram  1-3 years ago Pap Smear  1-5 years ago >5 years ago  Allergies Morphine Sulfate (PF) *ANALGESICS - OPIOID*  Swelling. Allergies Reconciled   Medication History  levoFLOXacin (500MG  Tablet, Oral) Active. Hydroxychloroquine Sulfate (200MG  Tablet, Oral) Active. Vitamin C ER (500MG  Capsule ER, Oral) Active. Vitamin D3 (5000UNIT Capsule, Oral) Active. Co Q 10 (30MG   Capsule, Oral) Active. Omega 3 (1000MG  Capsule, Oral) Active. Glucosamine Chondr 1500 Complx (Oral) Active. Meloxicam (15MG  Tablet, Oral) Active. Imitrex (100MG  Tablet, Oral) Active. NexIUM (40MG  Capsule DR, Oral) Active. Medications Reconciled  Social History  Caffeine use  Carbonated beverages, Coffee, Tea. No alcohol use  No drug use  Tobacco use  Never smoker. Alcohol use  Occasional alcohol use.  Family History  Alcohol Abuse  Brother, Sister. Arthritis  Mother, Sister. Bleeding disorder  Mother, Sister. Cancer  Brother. Cerebrovascular Accident  Sister. Depression  Mother, Sister. Heart Disease  Father, Mother. Heart disease in female family member before age 45  Hypertension  Brother, Father, Mother, Sister. Melanoma  Mother, Sister. Migraine Headache  Mother, Sister. Respiratory Condition  Brother. Arthritis  Brother, Mother. Diabetes Mellitus  Son.  Pregnancy / Birth History  Age at menarche  72 years. Age of menopause  <45 Gravida  1 Length (months) of breastfeeding  7-12 Maternal age  49-20 Para  1  Other Problems Anxiety Disorder  Arthritis  Back Pain  Depression  Gastroesophageal Reflux Disease  Hemorrhoids  Migraine Headache  Thyroid Disease  Bladder Problems     Review of Systems  General Present- Fatigue and Weight Gain. Not Present- Appetite Loss, Chills, Fever, Night Sweats and Weight Loss. Skin Present- Dryness. Not Present- Change in Wart/Mole, Hives, Jaundice, New Lesions, Non-Healing Wounds, Rash and Ulcer. HEENT Present- Ringing in the Ears, Seasonal Allergies, Sinus Pain, Sore Throat and Wears glasses/contact lenses. Not Present- Earache, Hearing Loss, Hoarseness, Nose Bleed, Oral Ulcers, Visual Disturbances and Yellow Eyes. Respiratory Present- Snoring. Not Present- Bloody sputum, Chronic Cough, Difficulty Breathing and Wheezing. Breast Not Present- Breast Mass, Breast Pain, Nipple Discharge and  Skin Changes. Cardiovascular Present- Leg Cramps and Swelling  of Extremities. Not Present- Chest Pain, Difficulty Breathing Lying Down, Palpitations, Rapid Heart Rate and Shortness of Breath. Gastrointestinal Present- Chronic diarrhea, Hemorrhoids, Nausea and Vomiting. Not Present- Abdominal Pain, Bloating, Bloody Stool, Change in Bowel Habits, Constipation, Difficulty Swallowing, Excessive gas, Gets full quickly at meals, Indigestion and Rectal Pain. Female Genitourinary Present- Frequency and Nocturia. Not Present- Painful Urination, Pelvic Pain and Urgency. Musculoskeletal Present- Back Pain, Joint Pain, Muscle Pain and Muscle Weakness. Not Present- Joint Stiffness and Swelling of Extremities. Neurological Present- Headaches, Trouble walking and Weakness. Not Present- Decreased Memory, Fainting, Numbness, Seizures, Tingling and Tremor. Psychiatric Present- Depression and Frequent crying. Not Present- Anxiety, Bipolar, Change in Sleep Pattern and Fearful. Endocrine Present- Cold Intolerance and Hair Changes. Not Present- Excessive Hunger, Heat Intolerance, Hot flashes and New Diabetes.  Vitals Weight: 212 lb Height: 63in Body Surface Area: 1.98 m Body Mass Index: 37.55 kg/m  Temp.: 29F  Pulse: 77 (Regular)  P.OX: 91% (Room air) BP: 126/84(Sitting, Left Arm, Standard)  Physical Exam   GENERAL APPEARANCE Development: normal Nutritional status: normal Gross deformities: none  SKIN Rash, lesions, ulcers: none Induration, erythema: none Nodules: none palpable  EYES Conjunctiva and lids: normal Pupils: equal and reactive Iris: normal bilaterally  EARS, NOSE, MOUTH, THROAT External ears: no lesion or deformity External nose: no lesion or deformity Hearing: grossly normal Due to Covid-19 pandemic, patient is wearing a mask.  NECK Symmetric: yes Trachea: midline Thyroid: no palpable nodules in the thyroid bed; well-healed anterior cervical  incision  CHEST Respiratory effort: normal Retraction or accessory muscle use: no Breath sounds: normal bilaterally Rales, rhonchi, wheeze: none  CARDIOVASCULAR Auscultation: regular rhythm, normal rate Murmurs: none Pulses: radial pulse 2+ palpable Lower extremity edema: Mild at the ankles, right side greater than left  MUSCULOSKELETAL Station and gait: normal Digits and nails: no clubbing or cyanosis Muscle strength: grossly normal all extremities Range of motion: grossly normal all extremities Deformity: none  LYMPHATIC Cervical: none palpable Supraclavicular: none palpable  PSYCHIATRIC Oriented to person, place, and time: yes Mood and affect: normal for situation Judgment and insight: appropriate for situation    Assessment & Plan  PRIMARY HYPERPARATHYROIDISM (E21.0) HISTORY OF PARATHYROID SURGERY (Z98.890)  Follow Up - Call [facility] office after tests / studies doneto discuss further plans  Patient returns today to discuss surgical intervention for persistent primary hyperparathyroidism. Patient has undergone localization studies including a nuclear medicine parathyroid scan, an ultrasound examination, and a 4D CT scan. Only the sestamibi scan localizes a left inferior adenoma.  Patient is also undergoing evaluation for an abnormal chest x-ray and will have a CT scan of the chest performed this afternoon. We will await the results of the study.  If the evaluation of the chest is negative, then I have recommended proceeding with outpatient surgery for left inferior parathyroidectomy. The patient and I discussed this procedure today during her office visit. She is willing to proceed.  If the CT scan of the chest performed this afternoon demonstrates a significant abnormality, then we will await further workup before proceeding with any type of surgical procedure.  We will contact the patient once the CT scan results from this afternoon are available.  Armandina Gemma,  MD [facility] Office: [phone]

## 2020-10-17 NOTE — Progress Notes (Addendum)
COVID Vaccine Completed: Yes x3 Date COVID Vaccine completed: 10-11-19, 11-09-19 Has received booster: 02-13-20 COVID vaccine manufacturer:    Moderna     Date of COVID positive in last 90 days:  N/A  PCP - C. Melinda Crutch, MD Cardiologist - N/A  Chest x-ray - 05-28-20 Epic EKG - 11-14-19 Epic Stress Test - N/A ECHO - N/A Cardiac Cath - N/A Pacemaker/ICD device last checked: Spinal Cord Stimulator:  Sleep Study - N/A CPAP -   Fasting Blood Sugar - N/A Checks Blood Sugar _____ times a day  Blood Thinner Instructions:  N/A Aspirin Instructions: Last Dose:  Activity level:  Can go up a flight of stairs and perform activities of daily living without stopping and without symptoms of chest pain or shortness of breath.  Able to exercise without symptoms     Anesthesia review:  N/A  Patient denies shortness of breath, fever, cough and chest pain at PAT appointment   Patient verbalized understanding of instructions that were given to them at the PAT appointment. Patient was also instructed that they will need to review over the PAT instructions again at home before surgery.

## 2020-10-17 NOTE — Patient Instructions (Addendum)
DUE TO COVID-19 ONLY ONE VISITOR IS ALLOWED TO COME WITH YOU AND STAY IN THE WAITING ROOM ONLY DURING PRE OP AND PROCEDURE.   **NO VISITORS ARE ALLOWED IN THE SHORT STAY AREA OR RECOVERY ROOM!!**         Your procedure is scheduled on: Tuesday, 10-31-20   Report to [facility] Main  Entrance   Report to admitting at 9:00 AM   Call this number if you have problems the morning of surgery [phone]   Do not eat food :After Midnight.   May have liquids until 8:00 AM day of surgery  CLEAR LIQUID DIET  Foods Allowed                                                                     Foods Excluded  Water, Black Coffee and tea, regular and decaf             liquids that you cannot  Plain Jell-O in any flavor  (No red)                                    see through such as: Fruit ices (not with fruit pulp)                                      milk, soups, orange juice              Iced Popsicles (No red)                                      All solid food                                   Apple juices Sports drinks like Gatorade (No red) Lightly seasoned clear broth or consume(fat free) Sugar, honey syrup    Oral Hygiene is also important to reduce your risk of infection.                                    Remember - BRUSH YOUR TEETH THE MORNING OF SURGERY WITH YOUR REGULAR TOOTHPASTE   Do NOT smoke after Midnight   Take these medicines the morning of surgery with A SIP OF WATER:  Nexium, Fenofibrate, Prednisone                              You may not have any metal on your body including hair pins, jewelry, and body piercings             Do not wear make-up, lotions, powders, perfumes/cologne, or deodorant             Do not wear nail polish.  Do not shave  48 hours prior to surgery.     Do not bring valuables to the  hospital. [facility] IS NOT  RESPONSIBLE   FOR VALUABLES.   Contacts, dentures or bridgework may not be worn into surgery.    Patients discharged  the day of surgery will not be allowed to drive home.               Please read over the following fact sheets you were given: IF YOU HAVE QUESTIONS ABOUT YOUR PRE OP INSTRUCTIONS PLEASE CALL  [facility] - Preparing for Surgery Before surgery, you can play an important role.  Because skin is not sterile, your skin needs to be as free of germs as possible.  You can reduce the number of germs on your skin by washing with CHG (chlorahexidine gluconate) soap before surgery.  CHG is an antiseptic cleaner which kills germs and bonds with the skin to continue killing germs even after washing. Please DO NOT use if you have an allergy to CHG or antibacterial soaps.  If your skin becomes reddened/irritated stop using the CHG and inform your nurse when you arrive at Short Stay. Do not shave (including legs and underarms) for at least 48 hours prior to the first CHG shower.  You may shave your face/neck.  Please follow these instructions carefully:  1.  Shower with CHG Soap the night before surgery and the  morning of surgery.  2.  If you choose to wash your hair, wash your hair first as usual with your normal  shampoo.  3.  After you shampoo, rinse your hair and body thoroughly to remove the shampoo.                             4.  Use CHG as you would any other liquid soap.  You can apply chg directly to the skin and wash.  Gently with a scrungie or clean washcloth.  5.  Apply the CHG Soap to your body ONLY FROM THE NECK DOWN.   Do   not use on face/ open                           Wound or open sores. Avoid contact with eyes, ears mouth and   genitals (private parts).                       Wash face,  Genitals (private parts) with your normal soap.             6.  Wash thoroughly, paying special attention to the area where your    surgery  will be performed.  7.  Thoroughly rinse your body with warm water from the neck down.  8.  DO NOT shower/wash with your normal soap after using  and rinsing off the CHG Soap.                9.  Pat yourself dry with a clean towel.            10.  Wear clean pajamas.            11.  Place clean sheets on your bed the night of your first shower and do not  sleep with pets. Day of Surgery : Do not apply any lotions/deodorants the morning of surgery.  Please wear clean clothes to the hospital/surgery center.  FAILURE TO FOLLOW THESE INSTRUCTIONS MAY RESULT IN THE CANCELLATION OF YOUR  SURGERY  PATIENT SIGNATURE_________________________________  NURSE SIGNATURE__________________________________  ________________________________________________________________________

## 2020-10-21 ENCOUNTER — Encounter: Payer: Self-pay | Admitting: Surgery

## 2020-10-21 NOTE — H&P (Signed)
General Surgery [facility]  Lynn Jenkins DOB: 04/08/1946 Married / Language: English / Race: White Female   History of Present Illness  The patient is a 75 year old female who presents with a parathyroid neoplasm.  CHIEF COMPLAINT:  persistent primary hyperparathyroidism  Patient returns to my practice for persistent primary hyperparathyroidism. Patient had undergone nuclear medicine parathyroid scan in July 2021. This localized a left inferior parathyroid adenoma. This area had been evaluated during her neck exploration in 2016. In an attempt to confirm the presence of an adenoma in the left inferior position, I obtained an ultrasound examination of the neck as well as a 4D CT scan of the neck. Neither study revealed evidence of a left inferior parathyroid adenoma. However, as an incidental finding, there was an abnormality of the lung. Patient is now undergoing evaluation which will include a chest CT scan later this afternoon. We will be awaiting the results of those studies.   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 Colon Polyp Removal - Colonoscopy  Foot Surgery  Bilateral. Hysterectomy (not due to cancer) - Partial  Mammoplasty; Reduction  Bilateral. Shoulder Surgery  Right. Thyroid Surgery   Diagnostic Studies History  Colonoscopy  5-10 years ago 1-5 years ago Mammogram  1-3 years ago Pap Smear  1-5 years ago >5 years ago  Allergies  Morphine Sulfate (PF) *ANALGESICS - OPIOID*  Swelling. Allergies Reconciled   Medication History levoFLOXacin (500MG  Tablet, Oral) Active. Hydroxychloroquine Sulfate (200MG  Tablet, Oral) Active. Vitamin C ER (500MG  Capsule ER, Oral) Active. Vitamin D3 (5000UNIT Capsule, Oral) Active. Co Q 10 (30MG   Capsule, Oral) Active. Omega 3 (1000MG  Capsule, Oral) Active. Glucosamine Chondr 1500 Complx (Oral) Active. Meloxicam (15MG  Tablet, Oral) Active. Imitrex (100MG  Tablet, Oral) Active. NexIUM (40MG  Capsule DR, Oral) Active. Medications Reconciled  Social History  Caffeine use  Carbonated beverages, Coffee, Tea. No alcohol use  No drug use  Tobacco use  Never smoker. Alcohol use  Occasional alcohol use.  Family History  Alcohol Abuse  Brother, Sister. Arthritis  Mother, Sister. Bleeding disorder  Mother, Sister. Cancer  Brother. Cerebrovascular Accident  Sister. Depression  Mother, Sister. Heart Disease  Father, Mother. Heart disease in female family member before age 38  Hypertension  Brother, Father, Mother, Sister. Melanoma  Mother, Sister. Migraine Headache  Mother, Sister. Respiratory Condition  Brother. Arthritis  Brother, Mother. Diabetes Mellitus  Son.  Pregnancy / Birth History Age at menarche  46 years. Age of menopause  <46 Gravida  1 Length (months) of breastfeeding  7-12 Maternal age  81-20 Para  1  Other Problems Anxiety Disorder  Arthritis  Back Pain  Depression  Gastroesophageal Reflux Disease  Hemorrhoids  Migraine Headache  Thyroid Disease  Bladder Problems     Review of Systems  General Present- Fatigue and Weight Gain. Not Present- Appetite Loss, Chills, Fever, Night Sweats and Weight Loss. Skin Present- Dryness. Not Present- Change in Wart/Mole, Hives, Jaundice, New Lesions, Non-Healing Wounds, Rash and Ulcer. HEENT Present- Ringing in the Ears, Seasonal Allergies, Sinus Pain, Sore Throat and Wears glasses/contact lenses. Not Present- Earache, Hearing Loss, Hoarseness, Nose Bleed, Oral Ulcers, Visual Disturbances and Yellow Eyes. Respiratory Present- Snoring. Not Present- Bloody sputum, Chronic Cough, Difficulty Breathing and Wheezing. Breast Not Present- Breast Mass, Breast Pain, Nipple Discharge and  Skin Changes. Cardiovascular Present- Leg Cramps  and Swelling of Extremities. Not Present- Chest Pain, Difficulty Breathing Lying Down, Palpitations, Rapid Heart Rate and Shortness of Breath. Gastrointestinal Present- Chronic diarrhea, Hemorrhoids, Nausea and Vomiting. Not Present- Abdominal Pain, Bloating, Bloody Stool, Change in Bowel Habits, Constipation, Difficulty Swallowing, Excessive gas, Gets full quickly at meals, Indigestion and Rectal Pain. Female Genitourinary Present- Frequency and Nocturia. Not Present- Painful Urination, Pelvic Pain and Urgency. Musculoskeletal Present- Back Pain, Joint Pain, Muscle Pain and Muscle Weakness. Not Present- Joint Stiffness and Swelling of Extremities. Neurological Present- Headaches, Trouble walking and Weakness. Not Present- Decreased Memory, Fainting, Numbness, Seizures, Tingling and Tremor. Psychiatric Present- Depression and Frequent crying. Not Present- Anxiety, Bipolar, Change in Sleep Pattern and Fearful. Endocrine Present- Cold Intolerance and Hair Changes. Not Present- Excessive Hunger, Heat Intolerance, Hot flashes and New Diabetes.  Vitals  Weight: 212 lb Height: 63in Body Surface Area: 1.98 m Body Mass Index: 37.55 kg/m  Temp.: 42F  Pulse: 77 (Regular)  P.OX: 91% (Room air) BP: 126/84(Sitting, Left Arm, Standard)  Physical Exam  GENERAL APPEARANCE Development: normal Nutritional status: normal Gross deformities: none  SKIN Rash, lesions, ulcers: none Induration, erythema: none Nodules: none palpable  EYES Conjunctiva and lids: normal Pupils: equal and reactive Iris: normal bilaterally  EARS, NOSE, MOUTH, THROAT External ears: no lesion or deformity External nose: no lesion or deformity Hearing: grossly normal Due to Covid-19 pandemic, patient is wearing a mask.  NECK Symmetric: yes Trachea: midline Thyroid: no palpable nodules in the thyroid bed; well-healed anterior cervical  incision  CHEST Respiratory effort: normal Retraction or accessory muscle use: no Breath sounds: normal bilaterally Rales, rhonchi, wheeze: none  CARDIOVASCULAR Auscultation: regular rhythm, normal rate Murmurs: none Pulses: radial pulse 2+ palpable Lower extremity edema: Mild at the ankles, right side greater than left  MUSCULOSKELETAL Station and gait: normal Digits and nails: no clubbing or cyanosis Muscle strength: grossly normal all extremities Range of motion: grossly normal all extremities Deformity: none  LYMPHATIC Cervical: none palpable Supraclavicular: none palpable  PSYCHIATRIC Oriented to person, place, and time: yes Mood and affect: normal for situation Judgment and insight: appropriate for situation    Assessment & Plan   PRIMARY HYPERPARATHYROIDISM (E21.0) HISTORY OF PARATHYROID SURGERY (Z98.890)  Follow Up - Call [facility] office after tests / studies doneto discuss further plans  Patient returns today to discuss surgical intervention for persistent primary hyperparathyroidism. Patient has undergone localization studies including a nuclear medicine parathyroid scan, an ultrasound examination, and a 4D CT scan. Only the sestamibi scan localizes a left inferior adenoma.  Patient is also undergoing evaluation for an abnormal chest x-ray and will have a CT scan of the chest performed this afternoon. We will await the results of the study.  If the evaluation of the chest is negative, then I have recommended proceeding with outpatient surgery for left inferior parathyroidectomy. The patient and I discussed this procedure today during her office visit. She is willing to proceed.  The risks and benefits of the procedure have been discussed at length with the patient.  The patient understands the proposed procedure, potential alternative treatments, and the course of recovery to be expected.  All of the patient's questions have been answered at this time.  The  patient wishes to proceed with surgery.   Armandina Gemma, MD [facility] Office: [phone]

## 2020-10-24 ENCOUNTER — Other Ambulatory Visit: Payer: Self-pay

## 2020-10-24 ENCOUNTER — Encounter (HOSPITAL_COMMUNITY): Payer: Self-pay

## 2020-10-24 ENCOUNTER — Encounter (HOSPITAL_COMMUNITY)
Admission: RE | Admit: 2020-10-24 | Discharge: 2020-10-24 | Disposition: A | Discharge status: Home / Self Care | Payer: Medicare Other | Source: Ambulatory Visit | Attending: Surgery | Admitting: Surgery

## 2020-10-24 DIAGNOSIS — Z01812 Encounter for preprocedural laboratory examination: Secondary | ICD-10-CM | POA: Insufficient documentation

## 2020-10-24 HISTORY — DX: Reserved for concepts with insufficient information to code with codable children: IMO0002

## 2020-10-24 HISTORY — DX: Systemic lupus erythematosus, unspecified: M32.9

## 2020-10-24 LAB — BASIC METABOLIC PANEL
Anion gap: 3 — ABNORMAL LOW (ref 5–15)
BUN: 18 mg/dL (ref 8–23)
CO2: 29 mmol/L (ref 22–32)
Calcium: 11.5 mg/dL — ABNORMAL HIGH (ref 8.9–10.3)
Chloride: 111 mmol/L (ref 98–111)
Creatinine, Ser: 1.02 mg/dL — ABNORMAL HIGH (ref 0.44–1.00)
GFR, Estimated: 58 mL/min — ABNORMAL LOW (ref >60)
Glucose, Bld: 117 mg/dL — ABNORMAL HIGH (ref 70–99)
Potassium: 4.9 mmol/L (ref 3.5–5.1)
Sodium: 143 mmol/L (ref 135–145)

## 2020-10-24 LAB — CBC
HCT: 38.9 % (ref 36.0–46.0)
Hemoglobin: 11.7 g/dL — ABNORMAL LOW (ref 12.0–15.0)
MCH: 27 pg (ref 26.0–34.0)
MCHC: 30.1 g/dL (ref 30.0–36.0)
MCV: 89.6 fL (ref 80.0–100.0)
Platelets: 174 10*3/uL (ref 150–400)
RBC: 4.34 MIL/uL (ref 3.87–5.11)
RDW: 14.7 % (ref 11.5–15.5)
WBC: 5.2 10*3/uL (ref 4.0–10.5)
nRBC: 0 % (ref 0.0–0.2)

## 2020-10-27 ENCOUNTER — Other Ambulatory Visit (HOSPITAL_COMMUNITY): Payer: Medicare Other

## 2020-10-30 NOTE — Progress Notes (Signed)
Called patient about time change for surgery on 10/31/20. Patient to arrive 0800 for 1000 surgery on 10/30/20. She verbalizes understanding.

## 2020-10-31 ENCOUNTER — Ambulatory Visit (HOSPITAL_COMMUNITY): Payer: Medicare Other | Admitting: Anesthesiology

## 2020-10-31 ENCOUNTER — Other Ambulatory Visit: Payer: Self-pay

## 2020-10-31 ENCOUNTER — Encounter (HOSPITAL_COMMUNITY)
Admission: RE | Disposition: A | Discharge status: Home / Self Care | Payer: Self-pay | Source: Home / Self Care | Attending: Surgery

## 2020-10-31 ENCOUNTER — Encounter (HOSPITAL_COMMUNITY): Payer: Self-pay | Admitting: Surgery

## 2020-10-31 ENCOUNTER — Ambulatory Visit (HOSPITAL_COMMUNITY)
Admission: RE | Admit: 2020-10-31 | Discharge: 2020-11-01 | Disposition: A | Discharge status: Home / Self Care | Payer: Medicare Other | Attending: Surgery | Admitting: Surgery

## 2020-10-31 DIAGNOSIS — Z809 Family history of malignant neoplasm, unspecified: Secondary | ICD-10-CM | POA: Diagnosis not present

## 2020-10-31 DIAGNOSIS — Z20822 Contact with and (suspected) exposure to covid-19: Secondary | ICD-10-CM | POA: Diagnosis not present

## 2020-10-31 DIAGNOSIS — K219 Gastro-esophageal reflux disease without esophagitis: Secondary | ICD-10-CM | POA: Diagnosis not present

## 2020-10-31 DIAGNOSIS — E210 Primary hyperparathyroidism: Secondary | ICD-10-CM | POA: Diagnosis present

## 2020-10-31 DIAGNOSIS — Z836 Family history of other diseases of the respiratory system: Secondary | ICD-10-CM | POA: Diagnosis not present

## 2020-10-31 DIAGNOSIS — Z881 Allergy status to other antibiotic agents status: Secondary | ICD-10-CM | POA: Diagnosis not present

## 2020-10-31 DIAGNOSIS — E049 Nontoxic goiter, unspecified: Secondary | ICD-10-CM | POA: Diagnosis not present

## 2020-10-31 DIAGNOSIS — Z823 Family history of stroke: Secondary | ICD-10-CM | POA: Insufficient documentation

## 2020-10-31 DIAGNOSIS — Z885 Allergy status to narcotic agent status: Secondary | ICD-10-CM | POA: Diagnosis not present

## 2020-10-31 DIAGNOSIS — Z833 Family history of diabetes mellitus: Secondary | ICD-10-CM | POA: Diagnosis not present

## 2020-10-31 DIAGNOSIS — E041 Nontoxic single thyroid nodule: Secondary | ICD-10-CM | POA: Diagnosis not present

## 2020-10-31 DIAGNOSIS — Z8249 Family history of ischemic heart disease and other diseases of the circulatory system: Secondary | ICD-10-CM | POA: Insufficient documentation

## 2020-10-31 DIAGNOSIS — Z79899 Other long term (current) drug therapy: Secondary | ICD-10-CM | POA: Insufficient documentation

## 2020-10-31 DIAGNOSIS — Z888 Allergy status to other drugs, medicaments and biological substances status: Secondary | ICD-10-CM | POA: Diagnosis not present

## 2020-10-31 DIAGNOSIS — Z8261 Family history of arthritis: Secondary | ICD-10-CM | POA: Insufficient documentation

## 2020-10-31 DIAGNOSIS — E782 Mixed hyperlipidemia: Secondary | ICD-10-CM | POA: Diagnosis not present

## 2020-10-31 DIAGNOSIS — E559 Vitamin D deficiency, unspecified: Secondary | ICD-10-CM | POA: Diagnosis not present

## 2020-10-31 HISTORY — PX: PARATHYROIDECTOMY: SHX19

## 2020-10-31 LAB — SARS CORONAVIRUS 2 BY RT PCR (HOSPITAL ORDER, PERFORMED IN ~~LOC~~ HOSPITAL LAB): SARS Coronavirus 2: NEGATIVE

## 2020-10-31 SURGERY — PARATHYROIDECTOMY
Anesthesia: General

## 2020-10-31 MED ORDER — DEXAMETHASONE SODIUM PHOSPHATE 10 MG/ML IJ SOLN
INTRAMUSCULAR | Status: DC | PRN
  Administered 2020-10-31: 10 mg via INTRAVENOUS

## 2020-10-31 MED ORDER — ROCURONIUM BROMIDE 10 MG/ML (PF) SYRINGE
PREFILLED_SYRINGE | INTRAVENOUS | Status: AC
  Filled 2020-10-31: qty 10

## 2020-10-31 MED ORDER — ONDANSETRON 4 MG PO TBDP: 4.0000 mg | ORAL_TABLET | Freq: Four times a day (QID) | ORAL | Status: DC | PRN

## 2020-10-31 MED ORDER — PROPOFOL 10 MG/ML IV BOLUS
INTRAVENOUS | Status: AC
  Filled 2020-10-31: qty 20

## 2020-10-31 MED ORDER — METOCLOPRAMIDE HCL 5 MG/ML IJ SOLN
INTRAMUSCULAR | Status: DC | PRN
  Administered 2020-10-31: 5 mg via INTRAVENOUS

## 2020-10-31 MED ORDER — ACETAMINOPHEN 325 MG PO TABS: 650.0000 mg | ORAL_TABLET | Freq: Four times a day (QID) | ORAL | Status: DC | PRN

## 2020-10-31 MED ORDER — LIDOCAINE 2% (20 MG/ML) 5 ML SYRINGE
INTRAMUSCULAR | Status: AC
  Filled 2020-10-31: qty 5

## 2020-10-31 MED ORDER — FENTANYL CITRATE (PF) 100 MCG/2ML IJ SOLN
INTRAMUSCULAR | Status: AC
  Filled 2020-10-31: qty 2

## 2020-10-31 MED ORDER — NETARSUDIL DIMESYLATE 0.02 % OP SOLN: 1.0000 [drp] | Freq: Every day | OPHTHALMIC | Status: DC

## 2020-10-31 MED ORDER — LEFLUNOMIDE 20 MG PO TABS
10.0000 mg | ORAL_TABLET | Freq: Every day | ORAL | Status: DC
  Administered 2020-10-31: 10 mg via ORAL
  Filled 2020-10-31 (×3): qty 0.5

## 2020-10-31 MED ORDER — ONDANSETRON HCL 4 MG/2ML IJ SOLN
INTRAMUSCULAR | Status: DC | PRN
  Administered 2020-10-31: 4 mg via INTRAVENOUS

## 2020-10-31 MED ORDER — PROPOFOL 1000 MG/100ML IV EMUL
INTRAVENOUS | Status: AC
  Filled 2020-10-31: qty 100

## 2020-10-31 MED ORDER — DEXAMETHASONE SODIUM PHOSPHATE 10 MG/ML IJ SOLN
INTRAMUSCULAR | Status: AC
  Filled 2020-10-31: qty 1

## 2020-10-31 MED ORDER — DORZOLAMIDE HCL-TIMOLOL MAL 2-0.5 % OP SOLN
1.0000 [drp] | Freq: Two times a day (BID) | OPHTHALMIC | Status: DC
  Administered 2020-10-31: 1 [drp] via OPHTHALMIC
  Filled 2020-10-31: qty 10

## 2020-10-31 MED ORDER — HYDROMORPHONE HCL 1 MG/ML IJ SOLN: 1.0000 mg | INTRAMUSCULAR | Status: DC | PRN

## 2020-10-31 MED ORDER — FENTANYL CITRATE (PF) 100 MCG/2ML IJ SOLN
25.0000 ug | INTRAMUSCULAR | Status: DC | PRN
  Administered 2020-10-31: 25 ug via INTRAVENOUS

## 2020-10-31 MED ORDER — SUGAMMADEX SODIUM 200 MG/2ML IV SOLN
INTRAVENOUS | Status: DC | PRN
  Administered 2020-10-31: 200 mg via INTRAVENOUS

## 2020-10-31 MED ORDER — CIPROFLOXACIN IN D5W 400 MG/200ML IV SOLN
400.0000 mg | INTRAVENOUS | Status: AC
  Administered 2020-10-31: 400 mg via INTRAVENOUS
  Filled 2020-10-31: qty 200

## 2020-10-31 MED ORDER — ROCURONIUM BROMIDE 10 MG/ML (PF) SYRINGE
PREFILLED_SYRINGE | INTRAVENOUS | Status: DC | PRN
  Administered 2020-10-31: 80 mg via INTRAVENOUS
  Administered 2020-10-31 (×2): 10 mg via INTRAVENOUS

## 2020-10-31 MED ORDER — ONDANSETRON HCL 4 MG/2ML IJ SOLN
INTRAMUSCULAR | Status: AC
  Filled 2020-10-31: qty 2

## 2020-10-31 MED ORDER — PHENYLEPHRINE 40 MCG/ML (10ML) SYRINGE FOR IV PUSH (FOR BLOOD PRESSURE SUPPORT)
PREFILLED_SYRINGE | INTRAVENOUS | Status: DC | PRN
  Administered 2020-10-31: 160 ug via INTRAVENOUS
  Administered 2020-10-31: 80 ug via INTRAVENOUS

## 2020-10-31 MED ORDER — PROPOFOL 10 MG/ML IV BOLUS
INTRAVENOUS | Status: DC | PRN
  Administered 2020-10-31: 100 mg via INTRAVENOUS

## 2020-10-31 MED ORDER — LATANOPROST 0.005 % OP SOLN
1.0000 [drp] | Freq: Every day | OPHTHALMIC | Status: DC
  Administered 2020-10-31: 1 [drp] via OPHTHALMIC
  Filled 2020-10-31: qty 2.5

## 2020-10-31 MED ORDER — CHLORHEXIDINE GLUCONATE CLOTH 2 % EX PADS: 6.0000 | MEDICATED_PAD | Freq: Once | CUTANEOUS | Status: DC

## 2020-10-31 MED ORDER — BUPIVACAINE HCL (PF) 0.25 % IJ SOLN
INTRAMUSCULAR | Status: AC
  Filled 2020-10-31: qty 30

## 2020-10-31 MED ORDER — ACETAMINOPHEN 500 MG PO TABS
1000.0000 mg | ORAL_TABLET | Freq: Once | ORAL | Status: AC
  Administered 2020-10-31: 1000 mg via ORAL
  Filled 2020-10-31: qty 2

## 2020-10-31 MED ORDER — HYDROXYCHLOROQUINE SULFATE 200 MG PO TABS
400.0000 mg | ORAL_TABLET | Freq: Every day | ORAL | Status: DC
  Administered 2020-10-31: 400 mg via ORAL
  Filled 2020-10-31 (×3): qty 2

## 2020-10-31 MED ORDER — LABETALOL HCL 5 MG/ML IV SOLN
INTRAVENOUS | Status: DC | PRN
  Administered 2020-10-31 (×2): 5 mg via INTRAVENOUS

## 2020-10-31 MED ORDER — SODIUM CHLORIDE 0.45 % IV SOLN: INTRAVENOUS | Status: DC

## 2020-10-31 MED ORDER — SERTRALINE HCL 100 MG PO TABS
100.0000 mg | ORAL_TABLET | Freq: Every day | ORAL | Status: DC
  Administered 2020-10-31: 100 mg via ORAL
  Filled 2020-10-31: qty 1

## 2020-10-31 MED ORDER — OXYCODONE HCL 5 MG PO TABS: 5.0000 mg | ORAL_TABLET | ORAL | Status: DC | PRN

## 2020-10-31 MED ORDER — BRIMONIDINE TARTRATE 0.2 % OP SOLN
1.0000 [drp] | Freq: Every day | OPHTHALMIC | Status: DC
  Administered 2020-10-31: 1 [drp] via OPHTHALMIC
  Filled 2020-10-31: qty 5

## 2020-10-31 MED ORDER — PROPOFOL 500 MG/50ML IV EMUL
INTRAVENOUS | Status: DC | PRN
  Administered 2020-10-31: 150 ug/kg/min via INTRAVENOUS

## 2020-10-31 MED ORDER — CHLORHEXIDINE GLUCONATE 0.12 % MT SOLN
15.0000 mL | Freq: Once | OROMUCOSAL | Status: AC
  Administered 2020-10-31: 15 mL via OROMUCOSAL

## 2020-10-31 MED ORDER — LACTATED RINGERS IV SOLN: INTRAVENOUS | Status: DC

## 2020-10-31 MED ORDER — TRAMADOL HCL 50 MG PO TABS
50.0000 mg | ORAL_TABLET | Freq: Four times a day (QID) | ORAL | Status: DC | PRN
Start: 2020-10-31 — End: 2020-11-01
  Administered 2020-10-31 (×2): 50 mg via ORAL
  Filled 2020-10-31 (×2): qty 1

## 2020-10-31 MED ORDER — FENOFIBRATE 160 MG PO TABS: 160.0000 mg | ORAL_TABLET | Freq: Every day | ORAL | Status: DC

## 2020-10-31 MED ORDER — METOCLOPRAMIDE HCL 5 MG/ML IJ SOLN
INTRAMUSCULAR | Status: AC
  Filled 2020-10-31: qty 2

## 2020-10-31 MED ORDER — LIDOCAINE 2% (20 MG/ML) 5 ML SYRINGE
INTRAMUSCULAR | Status: DC | PRN
  Administered 2020-10-31: 60 mg via INTRAVENOUS

## 2020-10-31 MED ORDER — ORAL CARE MOUTH RINSE: 15.0000 mL | Freq: Once | OROMUCOSAL | Status: AC

## 2020-10-31 MED ORDER — FENTANYL CITRATE (PF) 250 MCG/5ML IJ SOLN
INTRAMUSCULAR | Status: DC | PRN
  Administered 2020-10-31 (×2): 25 ug via INTRAVENOUS
  Administered 2020-10-31: 50 ug via INTRAVENOUS
  Administered 2020-10-31: 75 ug via INTRAVENOUS
  Administered 2020-10-31: 25 ug via INTRAVENOUS
  Administered 2020-10-31 (×2): 50 ug via INTRAVENOUS

## 2020-10-31 MED ORDER — ONDANSETRON HCL 4 MG/2ML IJ SOLN: 4.0000 mg | Freq: Four times a day (QID) | INTRAMUSCULAR | Status: DC | PRN

## 2020-10-31 MED ORDER — ACETAMINOPHEN 650 MG RE SUPP: 650.0000 mg | Freq: Four times a day (QID) | RECTAL | Status: DC | PRN

## 2020-10-31 MED ORDER — APREPITANT 40 MG PO CAPS
40.0000 mg | ORAL_CAPSULE | Freq: Once | ORAL | Status: AC
  Administered 2020-10-31: 40 mg via ORAL
  Filled 2020-10-31: qty 1

## 2020-10-31 SURGICAL SUPPLY — 36 items
ADH SKN CLS APL DERMABOND .7 (GAUZE/BANDAGES/DRESSINGS) ×1
APL PRP STRL LF DISP 70% ISPRP (MISCELLANEOUS) ×1
ATTRACTOMAT 16X20 MAGNETIC DRP (DRAPES) ×2 IMPLANT
BLADE SURG 15 STRL LF DISP TIS (BLADE) ×1 IMPLANT
BLADE SURG 15 STRL SS (BLADE) ×2
CHLORAPREP W/TINT 26 (MISCELLANEOUS) ×2 IMPLANT
CLIP VESOCCLUDE MED 6/CT (CLIP) ×5 IMPLANT
CLIP VESOCCLUDE SM WIDE 6/CT (CLIP) ×7 IMPLANT
COVER SURGICAL LIGHT HANDLE (MISCELLANEOUS) ×2 IMPLANT
COVER WAND RF STERILE (DRAPES) ×1 IMPLANT
DERMABOND ADVANCED (GAUZE/BANDAGES/DRESSINGS) ×1
DERMABOND ADVANCED .7 DNX12 (GAUZE/BANDAGES/DRESSINGS) ×1 IMPLANT
DRAPE LAPAROTOMY T 98X78 PEDS (DRAPES) ×2 IMPLANT
DRAPE UTILITY XL STRL (DRAPES) ×2 IMPLANT
ELECT REM PT RETURN 15FT ADLT (MISCELLANEOUS) ×2 IMPLANT
GAUZE 4X4 16PLY RFD (DISPOSABLE) ×4 IMPLANT
GLOVE SURG ORTHO LTX SZ8 (GLOVE) ×2 IMPLANT
GOWN STRL REUS W/TWL XL LVL3 (GOWN DISPOSABLE) ×6 IMPLANT
HEMOSTAT SURGICEL 2X4 FIBR (HEMOSTASIS) ×3 IMPLANT
ILLUMINATOR WAVEGUIDE N/F (MISCELLANEOUS) IMPLANT
KIT BASIN OR (CUSTOM PROCEDURE TRAY) ×2 IMPLANT
KIT TURNOVER KIT A (KITS) ×2 IMPLANT
NDL HYPO 25X1 1.5 SAFETY (NEEDLE) ×1 IMPLANT
NEEDLE HYPO 25X1 1.5 SAFETY (NEEDLE) ×2 IMPLANT
PACK BASIC VI WITH GOWN DISP (CUSTOM PROCEDURE TRAY) ×2 IMPLANT
PENCIL SMOKE EVACUATOR (MISCELLANEOUS) ×2 IMPLANT
SHEARS HARMONIC 9CM CVD (BLADE) ×1 IMPLANT
SUT MNCRL AB 4-0 PS2 18 (SUTURE) ×2 IMPLANT
SUT SILK 3 0 (SUTURE) ×2
SUT SILK 3-0 18XBRD TIE 12 (SUTURE) IMPLANT
SUT VIC AB 3-0 SH 18 (SUTURE) ×3 IMPLANT
SYR BULB IRRIG 60ML STRL (SYRINGE) ×2 IMPLANT
SYR CONTROL 10ML LL (SYRINGE) ×2 IMPLANT
TOWEL OR 17X26 10 PK STRL BLUE (TOWEL DISPOSABLE) ×2 IMPLANT
TOWEL OR NON WOVEN STRL DISP B (DISPOSABLE) ×2 IMPLANT
TUBING CONNECTING 10 (TUBING) ×2 IMPLANT

## 2020-10-31 NOTE — Anesthesia Procedure Notes (Signed)
Procedure Name: Intubation Date/Time: 10/31/2020 10:05 AM Performed by: Sharlette Dense, CRNA Patient Re-evaluated:Patient Re-evaluated prior to induction Oxygen Delivery Method: Circle system utilized Preoxygenation: Pre-oxygenation with 100% oxygen Induction Type: IV induction Ventilation: Mask ventilation without difficulty and Oral airway inserted - appropriate to patient size Laryngoscope Size: Sabra Heck and 2 Grade View: Grade I Tube type: Reinforced Tube size: 7.0 mm Number of attempts: 1 Airway Equipment and Method: Stylet Placement Confirmation: ETT inserted through vocal cords under direct vision,  positive ETCO2 and breath sounds checked- equal and bilateral Secured at: 21 cm Tube secured with: Tape Dental Injury: Teeth and Oropharynx as per pre-operative assessment

## 2020-10-31 NOTE — Anesthesia Preprocedure Evaluation (Addendum)
Anesthesia Evaluation  Patient identified by MRN, date of birth, ID band Patient awake    Reviewed: Allergy & Precautions, NPO status , Patient's Chart, lab work & pertinent test results  Airway Mallampati: III  TM Distance: >3 FB Neck ROM: Full    Dental no notable dental hx. (+) Teeth Intact, Dental Advisory Given   Pulmonary neg pulmonary ROS,    Pulmonary exam normal breath sounds clear to auscultation       Cardiovascular negative cardio ROS Normal cardiovascular exam Rhythm:Regular Rate:Normal     Neuro/Psych  Headaches, PSYCHIATRIC DISORDERS Anxiety    GI/Hepatic Neg liver ROS, hiatal hernia, GERD  Medicated and Controlled,  Endo/Other  Hyperthyroidism SLE, primary hyperparathyroidism  Renal/GU negative Renal ROS  negative genitourinary   Musculoskeletal  (+) Arthritis ,   Abdominal   Peds  Hematology negative hematology ROS (+)   Anesthesia Other Findings   Reproductive/Obstetrics                            Anesthesia Physical Anesthesia Plan  ASA: II  Anesthesia Plan: General   Post-op Pain Management:    Induction: Intravenous  PONV Risk Score and Plan: 3 and Midazolam, Dexamethasone and Ondansetron  Airway Management Planned: Oral ETT  Additional Equipment:   Intra-op Plan:   Post-operative Plan: Extubation in OR  Informed Consent: I have reviewed the patients History and Physical, chart, labs and discussed the procedure including the risks, benefits and alternatives for the proposed anesthesia with the patient or authorized representative who has indicated his/her understanding and acceptance.     Dental advisory given  Plan Discussed with: CRNA  Anesthesia Plan Comments:         Anesthesia Quick Evaluation

## 2020-10-31 NOTE — Op Note (Signed)
Operative Note  Pre-operative Diagnosis:  Persistent primary hyperparathyroidism  Post-operative Diagnosis:  same  Surgeon:  Armandina Gemma, MD  Assistant:  none   Procedure:  Neck exploration, left thyroid lobectomy  Anesthesia:  general  Estimated Blood Loss:  100cc  Drains: none         Specimen: left thyroid lobe, thyroid nodules to pathology  Indications: Patient is a 75 year old female who had undergone previous neck exploration and parathyroidectomy for primary hyperparathyroidism.  Postoperatively the patient had persistently elevated intact PTH levels.  Patient underwent additional studies including ultrasound examination, nuclear medicine parathyroid scan, and 4D CT scan of the neck and chest.  The nuclear scan showed a persistent area of activity at the inferior pole of the left thyroid lobe.  Patient now returns to the operating room for neck exploration.  Procedure:  The patient was seen in the pre-op holding area. The risks, benefits, complications, treatment options, and expected outcomes were previously discussed with the patient. The patient agreed with the proposed plan and has signed the informed consent form.  The patient was brought to the operating room by the surgical team, identified as [name] and the procedure verified. A "time out" was completed and the above information confirmed.  Following administration of general anesthesia, the patient is positioned and then prepped and draped in the usual aseptic fashion.  After ascertaining that an adequate level of anesthesia been achieved, the previous surgical incision is reopened with a #15 blade.  Dissection was carried through subcutaneous tissues using the electrocautery for hemostasis.  Platysma was divided.  Subplatysmal flaps are elevated cephalad and caudad.  A Weitlander retractor is placed for exposure.  Strap muscles are incised in the midline and reflected to the left.  The left thyroid lobe is exposed.   It is mildly enlarged and multinodular.  Dissection is begun around the inferior pole.  There appears to be a 2 cm nodule at the posterior lateral aspect of the thyroid lobe which could represent adenoma.  This is mobilized.  Vascular tributaries are divided between small and medium ligaclips.  Care is taken to avoid the recurrent nerve.  Nodule is completely excised.  It is sectioned and appears to be thyroid tissue.  However there are 2 small approximately 8 mm nodules on the surface which appear more promising for possible parathyroid tissue.  Therefore the entire nodule complex is submitted to pathology where 3 frozen section biopsies are performed by Dr. Claudette Laws.  He reports that each of these nodules appears to be thyroid tissue.  Exploration is then continued.  The entire lateral aspect of the left thyroid lobe is explored.  Dissection was carried back to the precervical fascia.  The lateral aspect of the esophagus is exposed.  Dissection was carried into the thyroid thymic tract which does not appear to contain any parathyroid tissue.  Dissection is carried down to the subclavian vessels and the common carotid artery laterally on the left.  There is no evidence of abnormal parathyroid tissue.  At this point a decision is made to proceed with left thyroid lobectomy for the possibility of intrathyroidal parathyroid adenoma.  Therefore the strap muscles are fully mobilized.  Midline incision and the strap muscles is extended cephalad.  A Mahorner self-retaining retractors placed.  Superior pole vessels are dissected out and divided individually between small and medium ligaclips with the harmonic scalpel.  Gland is rolled anteriorly.  Branches of the inferior thyroid artery are divided between small ligaclips with the  harmonic scalpel.  Recurrent nerve is identified and preserved.  Ligament of Gwenlyn Found is released with the electrocautery.  The gland is mobilized up and onto the anterior trachea.  Isthmus  is mobilized across the midline.  There is no significant pyramidal lobe present.  Thyroid parenchyma is transected at the junction of the isthmus and right thyroid lobe with the harmonic scalpel.  The left thyroid lobe is inspected ex vivo.  Using a #15 blade some of the nodules are sectioned.  The entire lobe is submitted to pathology for Dr. Claudette Laws again examined the specimen and perform frozen section biopsies.  He felt like all of these nodules represented thyroid tissue and not an intrathyroidal parathyroid gland.  Left neck was irrigated copiously with warm saline.  Good hemostasis was achieved.  Visual inspection again shows no evidence of parathyroid tissue.  Decision is made to conclude the procedure at this time.  Fibrillar is placed throughout the operative field.  Strap muscles are reapproximated in the midline with interrupted 3-0 Vicryl sutures.  Platysma was closed with interrupted 3-0 Vicryl sutures.  Skin was closed with running 4-0 Monocryl subcuticular suture.  Wound is washed and dried and Dermabond is applied as dressing.  Patient is awakened from anesthesia and transported to the recovery room in stable condition.  The patient tolerated the procedure well.   Armandina Gemma, MD [facility] Office: [phone]

## 2020-10-31 NOTE — Interval H&P Note (Signed)
History and Physical Interval Note:  10/31/2020 9:34 AM  Lynn Jenkins  has presented today for surgery, with the diagnosis of PRIMARY HYPERPARATHYROIDISM.  The various methods of treatment have been discussed with the patient and family. After consideration of risks, benefits and other options for treatment, the patient has consented to    Procedure(s): PARATHYROIDECTOMY (N/A) as a surgical intervention.    The patient's history has been reviewed, patient examined, no change in status, stable for surgery.  I have reviewed the patient's chart and labs.  Questions were answered to the patient's satisfaction.    Armandina Gemma, MD [facility] Office: [place]

## 2020-10-31 NOTE — Transfer of Care (Signed)
Immediate Anesthesia Transfer of Care Note  Patient: Lynn Jenkins  Procedure(s) Performed: PARATHYROIDECTOMY (N/A )  Patient Location: PACU  Anesthesia Type:General  Level of Consciousness: awake and oriented  Airway & Oxygen Therapy: Patient Spontanous Breathing and Patient connected to face mask oxygen  Post-op Assessment: Report given to RN and Post -op Vital signs reviewed and stable  Post vital signs: Reviewed and stable  Last Vitals:  Vitals Value Taken Time  BP 156/83 10/31/20 1311  Temp    Pulse 81 10/31/20 1314  Resp 17 10/31/20 1314  SpO2 98 % 10/31/20 1314  Vitals shown include unvalidated device data.  Last Pain:  Vitals:   10/31/20 0854  TempSrc:   PainSc: 0-No pain      Patients Stated Pain Goal: 4 (15/05/69 7948)  Complications: No complications documented.

## 2020-11-01 ENCOUNTER — Encounter (HOSPITAL_COMMUNITY): Payer: Self-pay | Admitting: Surgery

## 2020-11-01 DIAGNOSIS — E210 Primary hyperparathyroidism: Secondary | ICD-10-CM | POA: Diagnosis not present

## 2020-11-01 DIAGNOSIS — K219 Gastro-esophageal reflux disease without esophagitis: Secondary | ICD-10-CM | POA: Diagnosis not present

## 2020-11-01 DIAGNOSIS — Z8249 Family history of ischemic heart disease and other diseases of the circulatory system: Secondary | ICD-10-CM | POA: Diagnosis not present

## 2020-11-01 DIAGNOSIS — Z833 Family history of diabetes mellitus: Secondary | ICD-10-CM | POA: Diagnosis not present

## 2020-11-01 DIAGNOSIS — E041 Nontoxic single thyroid nodule: Secondary | ICD-10-CM | POA: Diagnosis not present

## 2020-11-01 DIAGNOSIS — Z79899 Other long term (current) drug therapy: Secondary | ICD-10-CM | POA: Diagnosis not present

## 2020-11-01 DIAGNOSIS — Z8261 Family history of arthritis: Secondary | ICD-10-CM | POA: Diagnosis not present

## 2020-11-01 DIAGNOSIS — Z881 Allergy status to other antibiotic agents status: Secondary | ICD-10-CM | POA: Diagnosis not present

## 2020-11-01 DIAGNOSIS — Z888 Allergy status to other drugs, medicaments and biological substances status: Secondary | ICD-10-CM | POA: Diagnosis not present

## 2020-11-01 DIAGNOSIS — Z885 Allergy status to narcotic agent status: Secondary | ICD-10-CM | POA: Diagnosis not present

## 2020-11-01 DIAGNOSIS — Z20822 Contact with and (suspected) exposure to covid-19: Secondary | ICD-10-CM | POA: Diagnosis not present

## 2020-11-01 DIAGNOSIS — Z823 Family history of stroke: Secondary | ICD-10-CM | POA: Diagnosis not present

## 2020-11-01 DIAGNOSIS — Z836 Family history of other diseases of the respiratory system: Secondary | ICD-10-CM | POA: Diagnosis not present

## 2020-11-01 DIAGNOSIS — Z809 Family history of malignant neoplasm, unspecified: Secondary | ICD-10-CM | POA: Diagnosis not present

## 2020-11-01 LAB — BASIC METABOLIC PANEL
Anion gap: 6 (ref 5–15)
BUN: 22 mg/dL (ref 8–23)
CO2: 26 mmol/L (ref 22–32)
Calcium: 10.9 mg/dL — ABNORMAL HIGH (ref 8.9–10.3)
Chloride: 106 mmol/L (ref 98–111)
Creatinine, Ser: 0.94 mg/dL (ref 0.44–1.00)
GFR, Estimated: >60 mL/min (ref >60)
Glucose, Bld: 124 mg/dL — ABNORMAL HIGH (ref 70–99)
Potassium: 5.2 mmol/L — ABNORMAL HIGH (ref 3.5–5.1)
Sodium: 138 mmol/L (ref 135–145)

## 2020-11-01 MED ORDER — TRAMADOL HCL 50 MG PO TABS: 50.0000 mg | ORAL_TABLET | Freq: Four times a day (QID) | ORAL | 0 refills | Status: DC | PRN

## 2020-11-01 NOTE — Progress Notes (Signed)
Discharge instructions discussed with patient, verbalized agreement and understanding 

## 2020-11-01 NOTE — Discharge Summary (Signed)
Physician Discharge Summary [facility]  Patient ID: Lynn Jenkins MRN: [id number] DOB/AGE: 09-21-1945 75 y.o.  Admit date: 10/31/2020  Discharge date: 11/01/2020  Discharge Diagnoses:  Principal Problem:   Primary hyperparathyroidism [facility])   Discharged Condition: good  Hospital Course: Patient was admitted for observation following thyroid and parathyroid surgery.  Post op course was uncomplicated.  Pain was well controlled.  Tolerated diet.  Post op calcium level on morning following surgery was 10.9 mg/dl.  Patient was prepared for discharge home on POD#1.  Consults: None  Treatments: surgery: neck exploration, left thyroid lobectomy  Discharge Exam: Blood pressure 120/68, pulse 63, temperature (!) 97.5 F (36.4 C), temperature source Oral, resp. rate 18, height 5\' 3"  (1.6 m), weight 91 kg, SpO2 92 %. HEENT - clear Neck - wound dry and intact; mild STS; Dermabond in place; mild hoarseness Chest - clear bilaterally Cor - RRR  Disposition: Home  Discharge Instructions    Diet - low sodium heart healthy   Complete by: As directed    Increase activity slowly   Complete by: As directed    No dressing needed   Complete by: As directed      Allergies as of 11/01/2020      Reactions   Doxycycline Swelling   Facial swelling, itching   Morphine And Related Swelling   Zomig [zolmitriptan] Nausea And Vomiting   Increased heart rate    Augmentin [amoxicillin-pot Clavulanate] Diarrhea   Ceftin [cefuroxime Axetil] Other (See Comments)   Stomach ache   Gabapentin Other (See Comments)   Confusion, off balance   Biaxin [clarithromycin] Rash   Cefdinir Rash   Sumatriptan Other (See Comments)   Can use brand Imitrex      Medication List    TAKE these medications   acetaminophen 500 MG tablet Commonly known as: TYLENOL Take 500-1,000 mg by mouth every 6 (six) hours as needed for mild pain or moderate pain.   brimonidine 0.2 % ophthalmic  solution Commonly known as: ALPHAGAN Place 1 drop into the left eye daily.   cholecalciferol 1000 units tablet Commonly known as: VITAMIN D Take 1,000 Units by mouth daily.   co-enzyme Q-10 30 MG capsule Take 30 mg by mouth daily.   dorzolamide-timolol 22.3-6.8 MG/ML ophthalmic solution Commonly known as: COSOPT Place 1 drop into both eyes 2 (two) times daily.   Durezol 0.05 % Emul Generic drug: Difluprednate Place 1 drop into the left eye in the morning and at bedtime.   esomeprazole 40 MG capsule Commonly known as: NEXIUM Take 40 mg by mouth 2 (two) times daily before a meal.   fenofibrate 160 MG tablet Take 160 mg by mouth at bedtime.   furosemide 20 MG tablet Commonly known as: LASIX Take 20 mg by mouth daily as needed for fluid.   Glucosamine 1500 Complex Caps Take 1 capsule by mouth daily.   Hair/Skin/Nails Tabs Take 1 tablet by mouth daily.   hydroxychloroquine 200 MG tablet Commonly known as: PLAQUENIL Take 400 mg by mouth daily.   leflunomide 10 MG tablet Commonly known as: ARAVA Take 10 mg by mouth daily.   metoCLOPramide 10 MG tablet Commonly known as: REGLAN Take 1 tablet (10 mg total) by mouth every 6 (six) hours as needed for nausea or vomiting. What changed: how much to take   multivitamin with minerals Tabs tablet Take 1 tablet by mouth daily.   Omega 3 1000 MG Caps Take 2,000 mg by mouth 2 (two) times  daily.   ondansetron 4 MG disintegrating tablet Commonly known as: Zofran ODT Take 1 tablet (4 mg total) by mouth every 8 (eight) hours as needed for nausea or vomiting.   Prolensa 0.07 % Soln Generic drug: Bromfenac Sodium Place 1 drop into the left eye daily.   Rhopressa 0.02 % Soln Generic drug: Netarsudil Dimesylate Place 1 drop into the left eye at bedtime.   sertraline 100 MG tablet Commonly known as: ZOLOFT Take 100 mg by mouth daily.   SUMAtriptan 100 MG tablet Commonly known as: IMITREX Take 100 mg by mouth every 2 (two)  hours as needed for migraine or headache. 1 tab at onset of headache, may reoeat  In 2 hrs, max of 2 pills in 24 hrs   TOTAL MEMORY & FOCUS FORMULA PO Take 2 tablets by mouth daily.   traMADol 50 MG tablet Commonly known as: ULTRAM Take 1-2 tablets (50-100 mg total) by mouth every 6 (six) hours as needed for moderate pain.   vitamin B-12 1000 MCG tablet Commonly known as: CYANOCOBALAMIN Take 1,000 mcg by mouth daily.   vitamin C 500 MG tablet Commonly known as: ASCORBIC ACID Take 500 mg by mouth daily as needed (when sick).   Xalatan 0.005 % ophthalmic solution Generic drug: latanoprost Place 1 drop into both eyes at bedtime.            Discharge Care Instructions  (From admission, onward)         Start     Ordered   11/01/20 0000  No dressing needed        11/01/20 0802          Follow-up Information    Armandina Gemma, MD. Schedule an appointment as soon as possible for a visit in 3 week(s).   Specialty: General Surgery Contact information: [address] [place] [address] [phone]               Armandina Gemma, [facility] Office: [phone]   Signed: Armandina Gemma 11/01/2020, 8:03 AM

## 2020-11-01 NOTE — Discharge Instructions (Signed)
[facility]  THYROID & PARATHYROID SURGERY:  POST-OP INSTRUCTIONS  Always review your discharge instruction sheet from the facility where your surgery was performed.  A prescription for pain medication may be given to you upon discharge.  Take your pain medication as prescribed.  If narcotic pain medicine is not needed, then you may take acetaminophen (Tylenol) or ibuprofen (Advil) as needed.  Take your usually prescribed medications unless otherwise directed.  If you need a refill on your pain medication, please contact our office during regular business hours.  Prescriptions cannot be processed by our office after 5 pm or on weekends.  Start with a light diet upon arrival home, such as soup and crackers or toast.  Be sure to drink plenty of fluids daily.  Resume your normal diet the day after surgery.  Most patients will experience some swelling and bruising on the chest and neck area.  Ice packs will help.  Swelling and bruising can take several days to resolve.   It is common to experience some constipation after surgery.  Increasing fluid intake and taking a stool softener (Colace) will usually help or prevent this problem.  A mild laxative (Milk of Magnesia or Miralax) should be taken according to package directions if there has been no bowel movement after 48 hours.  You have steri-strips and a gauze dressing over your incision.  You may remove the gauze bandage on the second day after surgery, and you may shower at that time.  Leave your steri-strips (small skin tapes) in place directly over the incision.  These strips should remain on the skin for 5-7 days and then be removed.  You may get them wet in the shower and pat them dry.  You may resume regular (light) daily activities beginning the next day (such as daily self-care, walking, climbing stairs) gradually increasing activities as tolerated.  You may have sexual intercourse when it is comfortable.  Refrain from  any heavy lifting or straining until approved by your doctor.  You may drive when you no longer are taking prescription pain medication, you can comfortably wear a seatbelt, and you can safely maneuver your car and apply brakes.  You should see your doctor in the office for a follow-up appointment approximately three weeks after your surgery.  Make sure that you call for this appointment within a day or two after you arrive home to insure a convenient appointment time.  WHEN TO CALL YOUR DOCTOR: -- Fever greater than 101.5 -- Inability to urinate -- Nausea and/or vomiting - persistent -- Extreme swelling or bruising -- Continued bleeding from incision -- Increased pain, redness, or drainage from the incision -- Difficulty swallowing or breathing -- Muscle cramping or spasms -- Numbness or tingling in hands or around lips  The clinic staff is available to answer your questions during regular business hours.  Please don't hesitate to call and ask to speak to one of the nurses if you have concerns.  Sybil Shrader, MD [facility] Office: [phone] 

## 2020-11-01 NOTE — Anesthesia Postprocedure Evaluation (Signed)
Anesthesia Post Note  Patient: Lynn Jenkins  Procedure(s) Performed: PARATHYROIDECTOMY WITH LEFT THYROID LOBECTOMY (N/A )     Patient location during evaluation: PACU Anesthesia Type: General Level of consciousness: awake and alert Pain management: pain level controlled Vital Signs Assessment: post-procedure vital signs reviewed and stable Respiratory status: spontaneous breathing, nonlabored ventilation, respiratory function stable and patient connected to nasal cannula oxygen Cardiovascular status: blood pressure returned to baseline and stable Postop Assessment: no apparent nausea or vomiting Anesthetic complications: no   No complications documented.  Last Vitals:  Vitals:   11/01/20 0632 11/01/20 0924  BP: 120/68 123/68  Pulse: 63 81  Resp: 18 18  Temp: (!) 36.4 C 37.1 C  SpO2: 92% 94%    Last Pain:  Vitals:   11/01/20 0924  TempSrc: Oral  PainSc:                  Edrei Norgaard L Carron Jaggi

## 2020-11-02 LAB — SURGICAL PATHOLOGY

## 2020-11-15 DIAGNOSIS — H59032 Cystoid macular edema following cataract surgery, left eye: Secondary | ICD-10-CM | POA: Diagnosis not present

## 2020-11-15 DIAGNOSIS — H2511 Age-related nuclear cataract, right eye: Secondary | ICD-10-CM | POA: Diagnosis not present

## 2020-11-15 DIAGNOSIS — H18513 Endothelial corneal dystrophy, bilateral: Secondary | ICD-10-CM | POA: Diagnosis not present

## 2020-11-15 DIAGNOSIS — H401132 Primary open-angle glaucoma, bilateral, moderate stage: Secondary | ICD-10-CM | POA: Diagnosis not present

## 2020-11-21 DIAGNOSIS — E210 Primary hyperparathyroidism: Secondary | ICD-10-CM | POA: Diagnosis not present

## 2020-11-21 DIAGNOSIS — E042 Nontoxic multinodular goiter: Secondary | ICD-10-CM | POA: Diagnosis not present

## 2020-11-21 DIAGNOSIS — Z9889 Other specified postprocedural states: Secondary | ICD-10-CM | POA: Diagnosis not present

## 2020-12-20 DIAGNOSIS — M79671 Pain in right foot: Secondary | ICD-10-CM | POA: Diagnosis not present

## 2020-12-20 DIAGNOSIS — M064 Inflammatory polyarthropathy: Secondary | ICD-10-CM | POA: Diagnosis not present

## 2020-12-20 DIAGNOSIS — M79672 Pain in left foot: Secondary | ICD-10-CM | POA: Diagnosis not present

## 2020-12-20 DIAGNOSIS — M2550 Pain in unspecified joint: Secondary | ICD-10-CM | POA: Diagnosis not present

## 2020-12-20 DIAGNOSIS — M1990 Unspecified osteoarthritis, unspecified site: Secondary | ICD-10-CM | POA: Diagnosis not present

## 2020-12-20 DIAGNOSIS — M549 Dorsalgia, unspecified: Secondary | ICD-10-CM | POA: Diagnosis not present

## 2020-12-21 DIAGNOSIS — E210 Primary hyperparathyroidism: Secondary | ICD-10-CM | POA: Diagnosis not present

## 2020-12-22 DIAGNOSIS — E210 Primary hyperparathyroidism: Secondary | ICD-10-CM | POA: Diagnosis not present

## 2020-12-25 DIAGNOSIS — M064 Inflammatory polyarthropathy: Secondary | ICD-10-CM | POA: Diagnosis not present

## 2020-12-25 DIAGNOSIS — M549 Dorsalgia, unspecified: Secondary | ICD-10-CM | POA: Diagnosis not present

## 2020-12-25 DIAGNOSIS — M2550 Pain in unspecified joint: Secondary | ICD-10-CM | POA: Diagnosis not present

## 2020-12-25 DIAGNOSIS — M1990 Unspecified osteoarthritis, unspecified site: Secondary | ICD-10-CM | POA: Diagnosis not present

## 2021-01-01 DIAGNOSIS — Z9889 Other specified postprocedural states: Secondary | ICD-10-CM | POA: Diagnosis not present

## 2021-01-01 DIAGNOSIS — Z9009 Acquired absence of other part of head and neck: Secondary | ICD-10-CM | POA: Diagnosis not present

## 2021-01-01 DIAGNOSIS — E210 Primary hyperparathyroidism: Secondary | ICD-10-CM | POA: Diagnosis not present

## 2021-01-03 DIAGNOSIS — H35352 Cystoid macular degeneration, left eye: Secondary | ICD-10-CM | POA: Diagnosis not present

## 2021-01-03 DIAGNOSIS — H209 Unspecified iridocyclitis: Secondary | ICD-10-CM | POA: Diagnosis not present

## 2021-01-03 DIAGNOSIS — E032 Hypothyroidism due to medicaments and other exogenous substances: Secondary | ICD-10-CM | POA: Insufficient documentation

## 2021-01-03 DIAGNOSIS — Z9009 Acquired absence of other part of head and neck: Secondary | ICD-10-CM | POA: Insufficient documentation

## 2021-01-03 DIAGNOSIS — H401112 Primary open-angle glaucoma, right eye, moderate stage: Secondary | ICD-10-CM | POA: Diagnosis not present

## 2021-01-03 DIAGNOSIS — H401123 Primary open-angle glaucoma, left eye, severe stage: Secondary | ICD-10-CM | POA: Diagnosis not present

## 2021-02-05 DIAGNOSIS — H18513 Endothelial corneal dystrophy, bilateral: Secondary | ICD-10-CM | POA: Diagnosis not present

## 2021-02-05 DIAGNOSIS — H401123 Primary open-angle glaucoma, left eye, severe stage: Secondary | ICD-10-CM | POA: Diagnosis not present

## 2021-02-05 DIAGNOSIS — H401112 Primary open-angle glaucoma, right eye, moderate stage: Secondary | ICD-10-CM | POA: Diagnosis not present

## 2021-02-22 DIAGNOSIS — M549 Dorsalgia, unspecified: Secondary | ICD-10-CM | POA: Diagnosis not present

## 2021-02-22 DIAGNOSIS — M1990 Unspecified osteoarthritis, unspecified site: Secondary | ICD-10-CM | POA: Diagnosis not present

## 2021-02-22 DIAGNOSIS — H401132 Primary open-angle glaucoma, bilateral, moderate stage: Secondary | ICD-10-CM | POA: Diagnosis not present

## 2021-02-22 DIAGNOSIS — M064 Inflammatory polyarthropathy: Secondary | ICD-10-CM | POA: Diagnosis not present

## 2021-02-22 DIAGNOSIS — H59032 Cystoid macular edema following cataract surgery, left eye: Secondary | ICD-10-CM | POA: Diagnosis not present

## 2021-02-22 DIAGNOSIS — M2550 Pain in unspecified joint: Secondary | ICD-10-CM | POA: Diagnosis not present

## 2021-02-22 DIAGNOSIS — H2511 Age-related nuclear cataract, right eye: Secondary | ICD-10-CM | POA: Diagnosis not present

## 2021-03-01 DIAGNOSIS — Z9842 Cataract extraction status, left eye: Secondary | ICD-10-CM | POA: Diagnosis not present

## 2021-03-01 DIAGNOSIS — H35352 Cystoid macular degeneration, left eye: Secondary | ICD-10-CM | POA: Diagnosis not present

## 2021-03-01 DIAGNOSIS — Z885 Allergy status to narcotic agent status: Secondary | ICD-10-CM | POA: Diagnosis not present

## 2021-03-01 DIAGNOSIS — Z79899 Other long term (current) drug therapy: Secondary | ICD-10-CM | POA: Diagnosis not present

## 2021-03-01 DIAGNOSIS — E890 Postprocedural hypothyroidism: Secondary | ICD-10-CM | POA: Diagnosis not present

## 2021-03-01 DIAGNOSIS — H401123 Primary open-angle glaucoma, left eye, severe stage: Secondary | ICD-10-CM | POA: Diagnosis not present

## 2021-03-01 DIAGNOSIS — I83893 Varicose veins of bilateral lower extremities with other complications: Secondary | ICD-10-CM | POA: Diagnosis not present

## 2021-03-01 DIAGNOSIS — Z791 Long term (current) use of non-steroidal anti-inflammatories (NSAID): Secondary | ICD-10-CM | POA: Diagnosis not present

## 2021-03-01 DIAGNOSIS — Z9889 Other specified postprocedural states: Secondary | ICD-10-CM | POA: Insufficient documentation

## 2021-03-01 DIAGNOSIS — Z888 Allergy status to other drugs, medicaments and biological substances status: Secondary | ICD-10-CM | POA: Diagnosis not present

## 2021-03-01 DIAGNOSIS — M1990 Unspecified osteoarthritis, unspecified site: Secondary | ICD-10-CM | POA: Diagnosis not present

## 2021-03-01 DIAGNOSIS — H25811 Combined forms of age-related cataract, right eye: Secondary | ICD-10-CM | POA: Diagnosis not present

## 2021-03-01 DIAGNOSIS — Z79890 Hormone replacement therapy (postmenopausal): Secondary | ICD-10-CM | POA: Diagnosis not present

## 2021-03-01 DIAGNOSIS — K219 Gastro-esophageal reflux disease without esophagitis: Secondary | ICD-10-CM | POA: Diagnosis not present

## 2021-03-01 DIAGNOSIS — Z961 Presence of intraocular lens: Secondary | ICD-10-CM | POA: Diagnosis not present

## 2021-03-01 DIAGNOSIS — H401112 Primary open-angle glaucoma, right eye, moderate stage: Secondary | ICD-10-CM | POA: Diagnosis not present

## 2021-04-02 DIAGNOSIS — E210 Primary hyperparathyroidism: Secondary | ICD-10-CM | POA: Diagnosis not present

## 2021-04-02 DIAGNOSIS — E042 Nontoxic multinodular goiter: Secondary | ICD-10-CM | POA: Diagnosis not present

## 2021-04-02 DIAGNOSIS — E032 Hypothyroidism due to medicaments and other exogenous substances: Secondary | ICD-10-CM | POA: Diagnosis not present

## 2021-04-02 DIAGNOSIS — Z9009 Acquired absence of other part of head and neck: Secondary | ICD-10-CM | POA: Diagnosis not present

## 2021-04-02 DIAGNOSIS — M899 Disorder of bone, unspecified: Secondary | ICD-10-CM | POA: Insufficient documentation

## 2021-04-02 DIAGNOSIS — M1990 Unspecified osteoarthritis, unspecified site: Secondary | ICD-10-CM | POA: Insufficient documentation

## 2021-04-02 DIAGNOSIS — M797 Fibromyalgia: Secondary | ICD-10-CM | POA: Insufficient documentation

## 2021-04-02 DIAGNOSIS — H409 Unspecified glaucoma: Secondary | ICD-10-CM | POA: Diagnosis present

## 2021-04-02 DIAGNOSIS — Z9889 Other specified postprocedural states: Secondary | ICD-10-CM | POA: Diagnosis not present

## 2021-04-11 DIAGNOSIS — M064 Inflammatory polyarthropathy: Secondary | ICD-10-CM | POA: Diagnosis not present

## 2021-04-11 DIAGNOSIS — M549 Dorsalgia, unspecified: Secondary | ICD-10-CM | POA: Diagnosis not present

## 2021-04-11 DIAGNOSIS — M1990 Unspecified osteoarthritis, unspecified site: Secondary | ICD-10-CM | POA: Diagnosis not present

## 2021-04-11 DIAGNOSIS — M2550 Pain in unspecified joint: Secondary | ICD-10-CM | POA: Diagnosis not present

## 2021-04-13 DIAGNOSIS — J0140 Acute pansinusitis, unspecified: Secondary | ICD-10-CM | POA: Diagnosis not present

## 2021-04-16 DIAGNOSIS — H401123 Primary open-angle glaucoma, left eye, severe stage: Secondary | ICD-10-CM | POA: Diagnosis not present

## 2021-04-16 DIAGNOSIS — Z9889 Other specified postprocedural states: Secondary | ICD-10-CM | POA: Diagnosis not present

## 2021-04-19 DIAGNOSIS — Z23 Encounter for immunization: Secondary | ICD-10-CM | POA: Diagnosis not present

## 2021-04-19 DIAGNOSIS — J019 Acute sinusitis, unspecified: Secondary | ICD-10-CM | POA: Diagnosis not present

## 2021-04-19 DIAGNOSIS — M62838 Other muscle spasm: Secondary | ICD-10-CM | POA: Diagnosis not present

## 2021-05-11 DIAGNOSIS — H401123 Primary open-angle glaucoma, left eye, severe stage: Secondary | ICD-10-CM | POA: Diagnosis not present

## 2021-06-10 HISTORY — PX: FRACTURE SURGERY: SHX138

## 2021-07-11 DIAGNOSIS — H18513 Endothelial corneal dystrophy, bilateral: Secondary | ICD-10-CM | POA: Diagnosis not present

## 2021-07-11 DIAGNOSIS — H401112 Primary open-angle glaucoma, right eye, moderate stage: Secondary | ICD-10-CM | POA: Diagnosis not present

## 2021-07-11 DIAGNOSIS — H35352 Cystoid macular degeneration, left eye: Secondary | ICD-10-CM | POA: Diagnosis not present

## 2021-07-11 DIAGNOSIS — H401123 Primary open-angle glaucoma, left eye, severe stage: Secondary | ICD-10-CM | POA: Diagnosis not present

## 2021-08-21 DIAGNOSIS — N183 Chronic kidney disease, stage 3 unspecified: Secondary | ICD-10-CM | POA: Diagnosis not present

## 2021-08-21 DIAGNOSIS — D72829 Elevated white blood cell count, unspecified: Secondary | ICD-10-CM | POA: Diagnosis not present

## 2021-08-21 DIAGNOSIS — E210 Primary hyperparathyroidism: Secondary | ICD-10-CM | POA: Diagnosis not present

## 2021-08-21 DIAGNOSIS — E039 Hypothyroidism, unspecified: Secondary | ICD-10-CM | POA: Diagnosis not present

## 2021-08-21 DIAGNOSIS — Z0000 Encounter for general adult medical examination without abnormal findings: Secondary | ICD-10-CM | POA: Diagnosis not present

## 2021-08-21 DIAGNOSIS — E559 Vitamin D deficiency, unspecified: Secondary | ICD-10-CM | POA: Diagnosis not present

## 2021-08-21 DIAGNOSIS — E782 Mixed hyperlipidemia: Secondary | ICD-10-CM | POA: Diagnosis not present

## 2021-08-23 ENCOUNTER — Other Ambulatory Visit: Payer: Self-pay | Admitting: Family Medicine

## 2021-08-27 DIAGNOSIS — M329 Systemic lupus erythematosus, unspecified: Secondary | ICD-10-CM | POA: Diagnosis not present

## 2021-08-27 DIAGNOSIS — M549 Dorsalgia, unspecified: Secondary | ICD-10-CM | POA: Diagnosis not present

## 2021-08-27 DIAGNOSIS — M1990 Unspecified osteoarthritis, unspecified site: Secondary | ICD-10-CM | POA: Diagnosis not present

## 2021-08-27 DIAGNOSIS — M064 Inflammatory polyarthropathy: Secondary | ICD-10-CM | POA: Diagnosis not present

## 2021-08-27 DIAGNOSIS — M2550 Pain in unspecified joint: Secondary | ICD-10-CM | POA: Diagnosis not present

## 2021-09-10 DIAGNOSIS — H401112 Primary open-angle glaucoma, right eye, moderate stage: Secondary | ICD-10-CM | POA: Diagnosis not present

## 2021-09-10 DIAGNOSIS — H35352 Cystoid macular degeneration, left eye: Secondary | ICD-10-CM | POA: Diagnosis not present

## 2021-09-10 DIAGNOSIS — H401123 Primary open-angle glaucoma, left eye, severe stage: Secondary | ICD-10-CM | POA: Diagnosis not present

## 2021-09-19 ENCOUNTER — Other Ambulatory Visit: Payer: Self-pay | Admitting: Family Medicine

## 2021-09-19 ENCOUNTER — Ambulatory Visit
Admission: RE | Admit: 2021-09-19 | Discharge: 2021-09-19 | Disposition: A | Discharge status: Home / Self Care | Payer: Medicare Other | Source: Ambulatory Visit | Attending: Family Medicine | Admitting: Family Medicine

## 2021-09-19 DIAGNOSIS — W010XXA Fall on same level from slipping, tripping and stumbling without subsequent striking against object, initial encounter: Secondary | ICD-10-CM | POA: Diagnosis not present

## 2021-09-19 DIAGNOSIS — N183 Chronic kidney disease, stage 3 unspecified: Secondary | ICD-10-CM | POA: Diagnosis not present

## 2021-09-19 DIAGNOSIS — M25561 Pain in right knee: Secondary | ICD-10-CM

## 2021-09-19 DIAGNOSIS — M25461 Effusion, right knee: Secondary | ICD-10-CM | POA: Diagnosis not present

## 2021-09-19 DIAGNOSIS — R2681 Unsteadiness on feet: Secondary | ICD-10-CM | POA: Diagnosis not present

## 2021-09-20 DIAGNOSIS — M25561 Pain in right knee: Secondary | ICD-10-CM | POA: Diagnosis not present

## 2021-10-01 DIAGNOSIS — M25561 Pain in right knee: Secondary | ICD-10-CM | POA: Diagnosis not present

## 2021-10-03 ENCOUNTER — Other Ambulatory Visit: Payer: Self-pay | Admitting: Sports Medicine

## 2021-10-03 DIAGNOSIS — M25561 Pain in right knee: Secondary | ICD-10-CM

## 2021-10-06 ENCOUNTER — Ambulatory Visit
Admission: RE | Admit: 2021-10-06 | Discharge: 2021-10-06 | Disposition: A | Discharge status: Home / Self Care | Payer: Medicare Other | Source: Ambulatory Visit | Attending: Sports Medicine | Admitting: Sports Medicine

## 2021-10-06 DIAGNOSIS — M25561 Pain in right knee: Secondary | ICD-10-CM

## 2021-10-07 ENCOUNTER — Other Ambulatory Visit: Payer: Medicare Other

## 2021-10-08 ENCOUNTER — Ambulatory Visit: Payer: Medicare Other

## 2021-10-11 DIAGNOSIS — S83241A Other tear of medial meniscus, current injury, right knee, initial encounter: Secondary | ICD-10-CM | POA: Diagnosis not present

## 2021-10-11 DIAGNOSIS — S83281A Other tear of lateral meniscus, current injury, right knee, initial encounter: Secondary | ICD-10-CM | POA: Diagnosis not present

## 2021-10-15 DIAGNOSIS — S83271A Complex tear of lateral meniscus, current injury, right knee, initial encounter: Secondary | ICD-10-CM | POA: Diagnosis not present

## 2021-10-15 DIAGNOSIS — S83231A Complex tear of medial meniscus, current injury, right knee, initial encounter: Secondary | ICD-10-CM | POA: Diagnosis not present

## 2021-10-15 DIAGNOSIS — S83241A Other tear of medial meniscus, current injury, right knee, initial encounter: Secondary | ICD-10-CM | POA: Diagnosis not present

## 2021-10-15 DIAGNOSIS — M2241 Chondromalacia patellae, right knee: Secondary | ICD-10-CM | POA: Diagnosis not present

## 2021-10-15 DIAGNOSIS — M84351A Stress fracture, right femur, initial encounter for fracture: Secondary | ICD-10-CM | POA: Diagnosis not present

## 2021-10-15 DIAGNOSIS — M6751 Plica syndrome, right knee: Secondary | ICD-10-CM | POA: Diagnosis not present

## 2021-10-15 DIAGNOSIS — M94261 Chondromalacia, right knee: Secondary | ICD-10-CM | POA: Diagnosis not present

## 2021-10-15 DIAGNOSIS — M84361A Stress fracture, right tibia, initial encounter for fracture: Secondary | ICD-10-CM | POA: Diagnosis not present

## 2021-10-15 DIAGNOSIS — G8918 Other acute postprocedural pain: Secondary | ICD-10-CM | POA: Diagnosis not present

## 2021-10-15 DIAGNOSIS — S83281A Other tear of lateral meniscus, current injury, right knee, initial encounter: Secondary | ICD-10-CM | POA: Diagnosis not present

## 2021-10-23 DIAGNOSIS — R262 Difficulty in walking, not elsewhere classified: Secondary | ICD-10-CM | POA: Diagnosis not present

## 2021-10-23 DIAGNOSIS — M6281 Muscle weakness (generalized): Secondary | ICD-10-CM | POA: Diagnosis not present

## 2021-10-23 DIAGNOSIS — M25661 Stiffness of right knee, not elsewhere classified: Secondary | ICD-10-CM | POA: Diagnosis not present

## 2021-10-23 DIAGNOSIS — Z9889 Other specified postprocedural states: Secondary | ICD-10-CM | POA: Diagnosis not present

## 2021-10-31 ENCOUNTER — Ambulatory Visit: Payer: Medicare Other

## 2021-11-19 DIAGNOSIS — M6281 Muscle weakness (generalized): Secondary | ICD-10-CM | POA: Diagnosis not present

## 2021-11-19 DIAGNOSIS — M25661 Stiffness of right knee, not elsewhere classified: Secondary | ICD-10-CM | POA: Diagnosis not present

## 2021-11-19 DIAGNOSIS — R262 Difficulty in walking, not elsewhere classified: Secondary | ICD-10-CM | POA: Diagnosis not present

## 2021-11-21 ENCOUNTER — Ambulatory Visit
Admission: RE | Admit: 2021-11-21 | Discharge: 2021-11-21 | Disposition: A | Discharge status: Home / Self Care | Payer: Medicare Other | Source: Ambulatory Visit | Attending: Family Medicine | Admitting: Family Medicine

## 2021-11-21 ENCOUNTER — Ambulatory Visit: Payer: Medicare Other

## 2021-11-21 DIAGNOSIS — Z1231 Encounter for screening mammogram for malignant neoplasm of breast: Secondary | ICD-10-CM

## 2021-11-21 DIAGNOSIS — E039 Hypothyroidism, unspecified: Secondary | ICD-10-CM | POA: Diagnosis not present

## 2021-11-26 DIAGNOSIS — M25661 Stiffness of right knee, not elsewhere classified: Secondary | ICD-10-CM | POA: Diagnosis not present

## 2021-11-26 DIAGNOSIS — M6281 Muscle weakness (generalized): Secondary | ICD-10-CM | POA: Diagnosis not present

## 2021-11-26 DIAGNOSIS — R262 Difficulty in walking, not elsewhere classified: Secondary | ICD-10-CM | POA: Diagnosis not present

## 2021-11-29 DIAGNOSIS — R262 Difficulty in walking, not elsewhere classified: Secondary | ICD-10-CM | POA: Diagnosis not present

## 2021-11-29 DIAGNOSIS — M25661 Stiffness of right knee, not elsewhere classified: Secondary | ICD-10-CM | POA: Diagnosis not present

## 2021-11-29 DIAGNOSIS — M6281 Muscle weakness (generalized): Secondary | ICD-10-CM | POA: Diagnosis not present

## 2021-12-04 DIAGNOSIS — M25661 Stiffness of right knee, not elsewhere classified: Secondary | ICD-10-CM | POA: Diagnosis not present

## 2021-12-04 DIAGNOSIS — M6281 Muscle weakness (generalized): Secondary | ICD-10-CM | POA: Diagnosis not present

## 2021-12-04 DIAGNOSIS — R262 Difficulty in walking, not elsewhere classified: Secondary | ICD-10-CM | POA: Diagnosis not present

## 2021-12-08 DEATH — deceased

## 2021-12-14 DIAGNOSIS — M6281 Muscle weakness (generalized): Secondary | ICD-10-CM | POA: Diagnosis not present

## 2021-12-14 DIAGNOSIS — M25661 Stiffness of right knee, not elsewhere classified: Secondary | ICD-10-CM | POA: Diagnosis not present

## 2021-12-14 DIAGNOSIS — R262 Difficulty in walking, not elsewhere classified: Secondary | ICD-10-CM | POA: Diagnosis not present

## 2021-12-17 DIAGNOSIS — M6281 Muscle weakness (generalized): Secondary | ICD-10-CM | POA: Diagnosis not present

## 2021-12-17 DIAGNOSIS — R262 Difficulty in walking, not elsewhere classified: Secondary | ICD-10-CM | POA: Diagnosis not present

## 2021-12-17 DIAGNOSIS — M25661 Stiffness of right knee, not elsewhere classified: Secondary | ICD-10-CM | POA: Diagnosis not present

## 2022-01-01 DIAGNOSIS — H524 Presbyopia: Secondary | ICD-10-CM | POA: Diagnosis not present

## 2022-01-15 DIAGNOSIS — H18513 Endothelial corneal dystrophy, bilateral: Secondary | ICD-10-CM | POA: Diagnosis not present

## 2022-01-15 DIAGNOSIS — H401112 Primary open-angle glaucoma, right eye, moderate stage: Secondary | ICD-10-CM | POA: Diagnosis not present

## 2022-01-15 DIAGNOSIS — H35352 Cystoid macular degeneration, left eye: Secondary | ICD-10-CM | POA: Diagnosis not present

## 2022-01-15 DIAGNOSIS — H401123 Primary open-angle glaucoma, left eye, severe stage: Secondary | ICD-10-CM | POA: Diagnosis not present

## 2022-01-17 DIAGNOSIS — Z471 Aftercare following joint replacement surgery: Secondary | ICD-10-CM | POA: Diagnosis not present

## 2022-02-15 ENCOUNTER — Emergency Department (HOSPITAL_COMMUNITY): Payer: Medicare Other

## 2022-02-15 ENCOUNTER — Other Ambulatory Visit: Payer: Self-pay

## 2022-02-15 ENCOUNTER — Encounter (HOSPITAL_COMMUNITY): Payer: Self-pay | Admitting: Emergency Medicine

## 2022-02-15 ENCOUNTER — Emergency Department (HOSPITAL_COMMUNITY)
Admission: EM | Admit: 2022-02-15 | Discharge: 2022-02-15 | Disposition: A | Discharge status: Home / Self Care | Payer: Medicare Other | Attending: Emergency Medicine | Admitting: Emergency Medicine

## 2022-02-15 DIAGNOSIS — R0989 Other specified symptoms and signs involving the circulatory and respiratory systems: Secondary | ICD-10-CM | POA: Diagnosis not present

## 2022-02-15 DIAGNOSIS — Z20822 Contact with and (suspected) exposure to covid-19: Secondary | ICD-10-CM | POA: Diagnosis not present

## 2022-02-15 DIAGNOSIS — I10 Essential (primary) hypertension: Secondary | ICD-10-CM | POA: Diagnosis not present

## 2022-02-15 DIAGNOSIS — J181 Lobar pneumonia, unspecified organism: Secondary | ICD-10-CM | POA: Insufficient documentation

## 2022-02-15 DIAGNOSIS — J189 Pneumonia, unspecified organism: Secondary | ICD-10-CM | POA: Diagnosis not present

## 2022-02-15 DIAGNOSIS — J449 Chronic obstructive pulmonary disease, unspecified: Secondary | ICD-10-CM | POA: Diagnosis not present

## 2022-02-15 DIAGNOSIS — R059 Cough, unspecified: Secondary | ICD-10-CM | POA: Diagnosis not present

## 2022-02-15 LAB — COMPREHENSIVE METABOLIC PANEL
ALT: 13 U/L (ref 0–44)
AST: 14 U/L — ABNORMAL LOW (ref 15–41)
Albumin: 4.1 g/dL (ref 3.5–5.0)
Alkaline Phosphatase: 88 U/L (ref 38–126)
Anion gap: 5 (ref 5–15)
BUN: 21 mg/dL (ref 8–23)
CO2: 26 mmol/L (ref 22–32)
Calcium: 12 mg/dL — ABNORMAL HIGH (ref 8.9–10.3)
Chloride: 108 mmol/L (ref 98–111)
Creatinine, Ser: 1.09 mg/dL — ABNORMAL HIGH (ref 0.44–1.00)
GFR, Estimated: 53 mL/min — ABNORMAL LOW (ref >60)
Glucose, Bld: 89 mg/dL (ref 70–99)
Potassium: 4.2 mmol/L (ref 3.5–5.1)
Sodium: 139 mmol/L (ref 135–145)
Total Bilirubin: 0.4 mg/dL (ref 0.3–1.2)
Total Protein: 7.8 g/dL (ref 6.5–8.1)

## 2022-02-15 LAB — CBC WITH DIFFERENTIAL/PLATELET
Abs Immature Granulocytes: 0.03 10*3/uL (ref 0.00–0.07)
Basophils Absolute: 0 10*3/uL (ref 0.0–0.1)
Basophils Relative: 0 %
Eosinophils Absolute: 0.1 10*3/uL (ref 0.0–0.5)
Eosinophils Relative: 2 %
HCT: 35.4 % — ABNORMAL LOW (ref 36.0–46.0)
Hemoglobin: 10.5 g/dL — ABNORMAL LOW (ref 12.0–15.0)
Immature Granulocytes: 0 %
Lymphocytes Relative: 15 %
Lymphs Abs: 1.4 10*3/uL (ref 0.7–4.0)
MCH: 24.3 pg — ABNORMAL LOW (ref 26.0–34.0)
MCHC: 29.7 g/dL — ABNORMAL LOW (ref 30.0–36.0)
MCV: 81.9 fL (ref 80.0–100.0)
Monocytes Absolute: 0.6 10*3/uL (ref 0.1–1.0)
Monocytes Relative: 6 %
Neutro Abs: 7.3 10*3/uL (ref 1.7–7.7)
Neutrophils Relative %: 77 %
Platelets: 249 10*3/uL (ref 150–400)
RBC: 4.32 MIL/uL (ref 3.87–5.11)
RDW: 16.1 % — ABNORMAL HIGH (ref 11.5–15.5)
WBC: 9.5 10*3/uL (ref 4.0–10.5)
nRBC: 0 % (ref 0.0–0.2)

## 2022-02-15 LAB — RESP PANEL BY RT-PCR (FLU A&B, COVID) ARPGX2
Influenza A by PCR: NEGATIVE
Influenza B by PCR: NEGATIVE
SARS Coronavirus 2 by RT PCR: NEGATIVE

## 2022-02-15 LAB — TROPONIN I (HIGH SENSITIVITY): Troponin I (High Sensitivity): 5 ng/L (ref <18)

## 2022-02-15 MED ORDER — AMOXICILLIN 500 MG PO CAPS: 1000.0000 mg | ORAL_CAPSULE | Freq: Three times a day (TID) | ORAL | 0 refills | Status: DC

## 2022-02-15 MED ORDER — AMOXICILLIN 500 MG PO CAPS: 1000.0000 mg | ORAL_CAPSULE | Freq: Three times a day (TID) | ORAL | 0 refills | Status: AC

## 2022-02-15 MED ORDER — AMOXICILLIN 500 MG PO CAPS
1000.0000 mg | ORAL_CAPSULE | Freq: Once | ORAL | Status: AC
  Administered 2022-02-15: 1000 mg via ORAL
  Filled 2022-02-15: qty 2

## 2022-02-15 NOTE — Discharge Instructions (Signed)
We are treating you with antibiotic for a possible developing pneumonia.  Please continue the antibiotics and follow-up with your primary care physician.  Return with any new or suddenly worsening symptoms.

## 2022-02-15 NOTE — ED Triage Notes (Signed)
Patient c/o cough x2 days with sore throat and headache.

## 2022-02-15 NOTE — ED Provider Notes (Signed)
Emergency Department Provider Note   I have reviewed the triage vital signs and the nursing notes.   HISTORY  Chief Complaint Cough   HPI Daylynn Stumpp is a 76 y.o. female with PMH of lupus, HTN, GERD, and HLD presents to the emergency department for evaluation of cough and chest discomfort.  Symptoms have been worsening over the past 2 to 3 days.  Patient states initially she had some congestion and lost her voice which seems to be returning but has since developed chest congestion and cough.  She has constant tightness in her chest, even without coughing but does feel worse with cough.  No exertional pain.  No severe dyspnea.  No diaphoresis or nausea/vomiting. She has not noticed fever.    Past Medical History:  Diagnosis Date   Anemia    Arthritis    Generalized anxiety disorder    GERD (gastroesophageal reflux disease)    Glaucoma    Headache    hx of migraines    History of hiatal hernia    Hyperthyroidism    Lupus (HCC)    Migraine    Mixed hyperlipidemia    Multinodular goiter    Osteopenia    Primary hyperparathyroidism ([place])    Varicose veins    Vitamin D deficiency     Review of Systems  Constitutional: No fever/chills. Positive fatigue.  Eyes: No visual changes. ENT: No sore throat. Cardiovascular: Positive chest pain. Respiratory: Denies shortness of breath. Positive cough.  Gastrointestinal: No abdominal pain.  No nausea, no vomiting.  No diarrhea.  No constipation. Genitourinary: Negative for dysuria. Musculoskeletal: Negative for back pain. Skin: Negative for rash. Neurological: Negative for headaches, focal weakness or numbness.   ____________________________________________   PHYSICAL EXAM:  VITAL SIGNS: ED Triage Vitals  Enc Vitals Group     BP 02/15/22 1622 (!) 135/104     Pulse Rate 02/15/22 1622 83     Resp 02/15/22 1622 20     Temp 02/15/22 1622 99.3 F (37.4 C)     Temp Source 02/15/22 1622 Oral     SpO2 02/15/22 1622 98 %      Weight 02/15/22 1724 200 lb (90.7 kg)     Height 02/15/22 1724 '5\' 3"'$  (1.6 m)   Constitutional: Alert and oriented. Well appearing and in no acute distress. Eyes: Conjunctivae are normal.  Head: Atraumatic. Nose: No congestion/rhinnorhea. Mouth/Throat: Mucous membranes are moist.   Neck: No stridor.   Cardiovascular: Normal rate, regular rhythm. Good peripheral circulation. Grossly normal heart sounds.   Respiratory: Normal respiratory effort.  No retractions. Lungs CTAB. Gastrointestinal: Soft and nontender. No distention.  Musculoskeletal: No lower extremity tenderness nor edema. No gross deformities of extremities. Neurologic:  Normal speech and language. No gross focal neurologic deficits are appreciated.  Skin:  Skin is warm, dry and intact. No rash noted.  ____________________________________________   LABS (all labs ordered are listed, but only abnormal results are displayed)  Labs Reviewed  RESP PANEL BY RT-PCR (FLU A&B, COVID) [id number]   ____________________________________________  EKG   EKG Interpretation  Date/Time:    Ventricular Rate:    PR Interval:    QRS Duration:   QT Interval:    QTC Calculation:   R Axis:     Text Interpretation:          ____________________________________________  RADIOLOGY  No results found.  ____________________________________________   PROCEDURES  Procedure(s) performed:   Procedures   ____________________________________________   INITIAL IMPRESSION / ASSESSMENT AND PLAN /  ED COURSE  Pertinent labs & imaging results that were available during my care of the patient were reviewed by me and considered in my medical decision making (see chart for details).   This patient is Presenting for Evaluation of CP, which does require a range of treatment options, and is a complaint that involves a moderate risk of morbidity and mortality.  The Differential Diagnoses  includes but is not exclusive to acute coronary  syndrome, aortic dissection, pulmonary embolism, cardiac tamponade, community-acquired pneumonia, pericarditis, musculoskeletal chest wall pain, etc.   Critical Interventions-    Medications - No data to display  Reassessment after intervention:      Clinical Laboratory Tests Ordered, included ***  Radiologic Tests Ordered, included CXR. I independently interpreted the images and agree with radiology interpretation.   Cardiac Monitor Tracing which shows NSR.    Social Determinants of Health Risk no smoking history.   Consult complete with  Medical Decision Making: Summary:  Patient presents to the emergency department for evaluation cough/congestion and chest pain. Pain is fairly constant and patient has several ACS risk factors. Plan for screening EKG and troponin. ? PNA on CXR.   Reevaluation with update and discussion with   ***Considered admission***  Disposition:   ____________________________________________  FINAL CLINICAL IMPRESSION(S) / ED DIAGNOSES  Final diagnoses:  None     NEW OUTPATIENT MEDICATIONS STARTED DURING THIS VISIT:  New Prescriptions   No medications on file    Note:  This document was prepared using Dragon voice recognition software and may include unintentional dictation errors.  Nanda Quinton, MD, [facility] Emergency Medicine

## 2022-02-15 NOTE — ED Provider Triage Note (Addendum)
Emergency Medicine Provider Triage Evaluation Note  Lynn Jenkins , a 76 y.o. female  was evaluated in triage.  Pt complains of cough for 2-3 days. +sinus congestion  She called her PCP who said they could not do xrays in office and recommended going to ER.   Vaccinated against COVID x4  Aches "I'm recovering from knee surgery"  Fever, CP  Review of Systems  Positive: Cough  Negative: Fever   Physical Exam  BP (!) 135/104 (BP Location: Left Arm)   Pulse 83   Temp 99.3 F (37.4 C) (Oral)   Resp 20   SpO2 98%  Gen:   Awake, no distress   Resp:  Normal effort  MSK:   Moves extremities without difficulty  Other:  Not tachypneic, L base w ? crackles  Medical Decision Making  Medically screening exam initiated at 4:48 PM.  Appropriate orders placed.  Lynn Jenkins was informed that the remainder of the evaluation will be completed by another provider, this initial triage assessment does not replace that evaluation, and the importance of remaining in the ED until their evaluation is complete.  CXR, covid/flu      Tedd Sias, [place] 02/15/22 Vernelle Emerald

## 2022-02-21 DIAGNOSIS — M25562 Pain in left knee: Secondary | ICD-10-CM | POA: Diagnosis not present

## 2022-02-25 DIAGNOSIS — E039 Hypothyroidism, unspecified: Secondary | ICD-10-CM | POA: Diagnosis not present

## 2022-02-25 DIAGNOSIS — E782 Mixed hyperlipidemia: Secondary | ICD-10-CM | POA: Diagnosis not present

## 2022-03-09 ENCOUNTER — Inpatient Hospital Stay (HOSPITAL_COMMUNITY)
Admission: EM | Admit: 2022-03-09 | Discharge: 2022-03-15 | DRG: 419 | Disposition: A | Discharge status: Home / Self Care | Payer: Medicare Other | Attending: Internal Medicine | Admitting: Internal Medicine

## 2022-03-09 ENCOUNTER — Other Ambulatory Visit: Payer: Self-pay

## 2022-03-09 ENCOUNTER — Encounter (HOSPITAL_COMMUNITY): Payer: Self-pay | Admitting: Emergency Medicine

## 2022-03-09 ENCOUNTER — Emergency Department (HOSPITAL_COMMUNITY): Payer: Medicare Other

## 2022-03-09 DIAGNOSIS — K8020 Calculus of gallbladder without cholecystitis without obstruction: Secondary | ICD-10-CM | POA: Diagnosis not present

## 2022-03-09 DIAGNOSIS — Z8249 Family history of ischemic heart disease and other diseases of the circulatory system: Secondary | ICD-10-CM

## 2022-03-09 DIAGNOSIS — H409 Unspecified glaucoma: Secondary | ICD-10-CM | POA: Diagnosis present

## 2022-03-09 DIAGNOSIS — K219 Gastro-esophageal reflux disease without esophagitis: Secondary | ICD-10-CM | POA: Diagnosis present

## 2022-03-09 DIAGNOSIS — K810 Acute cholecystitis: Secondary | ICD-10-CM | POA: Diagnosis present

## 2022-03-09 DIAGNOSIS — Z79899 Other long term (current) drug therapy: Secondary | ICD-10-CM | POA: Diagnosis not present

## 2022-03-09 DIAGNOSIS — K819 Cholecystitis, unspecified: Secondary | ICD-10-CM | POA: Diagnosis not present

## 2022-03-09 DIAGNOSIS — K449 Diaphragmatic hernia without obstruction or gangrene: Secondary | ICD-10-CM | POA: Diagnosis not present

## 2022-03-09 DIAGNOSIS — Z83438 Family history of other disorder of lipoprotein metabolism and other lipidemia: Secondary | ICD-10-CM

## 2022-03-09 DIAGNOSIS — R112 Nausea with vomiting, unspecified: Secondary | ICD-10-CM | POA: Diagnosis not present

## 2022-03-09 DIAGNOSIS — K660 Peritoneal adhesions (postprocedural) (postinfection): Secondary | ICD-10-CM | POA: Diagnosis present

## 2022-03-09 DIAGNOSIS — K760 Fatty (change of) liver, not elsewhere classified: Secondary | ICD-10-CM | POA: Diagnosis not present

## 2022-03-09 DIAGNOSIS — K828 Other specified diseases of gallbladder: Secondary | ICD-10-CM | POA: Diagnosis present

## 2022-03-09 DIAGNOSIS — E210 Primary hyperparathyroidism: Secondary | ICD-10-CM | POA: Diagnosis present

## 2022-03-09 DIAGNOSIS — K8010 Calculus of gallbladder with chronic cholecystitis without obstruction: Secondary | ICD-10-CM | POA: Diagnosis not present

## 2022-03-09 DIAGNOSIS — E559 Vitamin D deficiency, unspecified: Secondary | ICD-10-CM | POA: Diagnosis present

## 2022-03-09 DIAGNOSIS — E669 Obesity, unspecified: Secondary | ICD-10-CM | POA: Diagnosis present

## 2022-03-09 DIAGNOSIS — J984 Other disorders of lung: Secondary | ICD-10-CM | POA: Diagnosis not present

## 2022-03-09 DIAGNOSIS — Z885 Allergy status to narcotic agent status: Secondary | ICD-10-CM

## 2022-03-09 DIAGNOSIS — R160 Hepatomegaly, not elsewhere classified: Secondary | ICD-10-CM | POA: Diagnosis not present

## 2022-03-09 DIAGNOSIS — N281 Cyst of kidney, acquired: Secondary | ICD-10-CM | POA: Diagnosis not present

## 2022-03-09 DIAGNOSIS — Z888 Allergy status to other drugs, medicaments and biological substances status: Secondary | ICD-10-CM | POA: Diagnosis not present

## 2022-03-09 DIAGNOSIS — E782 Mixed hyperlipidemia: Secondary | ICD-10-CM | POA: Diagnosis present

## 2022-03-09 DIAGNOSIS — G43009 Migraine without aura, not intractable, without status migrainosus: Secondary | ICD-10-CM | POA: Diagnosis not present

## 2022-03-09 DIAGNOSIS — D509 Iron deficiency anemia, unspecified: Secondary | ICD-10-CM | POA: Diagnosis not present

## 2022-03-09 DIAGNOSIS — Z20822 Contact with and (suspected) exposure to covid-19: Secondary | ICD-10-CM | POA: Diagnosis present

## 2022-03-09 DIAGNOSIS — E039 Hypothyroidism, unspecified: Secondary | ICD-10-CM | POA: Diagnosis present

## 2022-03-09 DIAGNOSIS — K838 Other specified diseases of biliary tract: Secondary | ICD-10-CM | POA: Diagnosis not present

## 2022-03-09 DIAGNOSIS — F411 Generalized anxiety disorder: Secondary | ICD-10-CM | POA: Diagnosis present

## 2022-03-09 DIAGNOSIS — D649 Anemia, unspecified: Secondary | ICD-10-CM | POA: Diagnosis not present

## 2022-03-09 DIAGNOSIS — K5730 Diverticulosis of large intestine without perforation or abscess without bleeding: Secondary | ICD-10-CM | POA: Diagnosis not present

## 2022-03-09 DIAGNOSIS — Z6834 Body mass index (BMI) 34.0-34.9, adult: Secondary | ICD-10-CM | POA: Diagnosis not present

## 2022-03-09 DIAGNOSIS — G43001 Migraine without aura, not intractable, with status migrainosus: Secondary | ICD-10-CM | POA: Diagnosis not present

## 2022-03-09 DIAGNOSIS — R1011 Right upper quadrant pain: Secondary | ICD-10-CM | POA: Diagnosis not present

## 2022-03-09 DIAGNOSIS — Z96662 Presence of left artificial ankle joint: Secondary | ICD-10-CM | POA: Diagnosis present

## 2022-03-09 DIAGNOSIS — D508 Other iron deficiency anemias: Secondary | ICD-10-CM | POA: Diagnosis not present

## 2022-03-09 DIAGNOSIS — N179 Acute kidney failure, unspecified: Secondary | ICD-10-CM | POA: Diagnosis not present

## 2022-03-09 DIAGNOSIS — E038 Other specified hypothyroidism: Secondary | ICD-10-CM | POA: Diagnosis not present

## 2022-03-09 LAB — COMPREHENSIVE METABOLIC PANEL
ALT: 16 U/L (ref 0–44)
AST: 15 U/L (ref 15–41)
Albumin: 3.6 g/dL (ref 3.5–5.0)
Alkaline Phosphatase: 78 U/L (ref 38–126)
Anion gap: 6 (ref 5–15)
BUN: 30 mg/dL — ABNORMAL HIGH (ref 8–23)
CO2: 26 mmol/L (ref 22–32)
Calcium: 11.7 mg/dL — ABNORMAL HIGH (ref 8.9–10.3)
Chloride: 109 mmol/L (ref 98–111)
Creatinine, Ser: 1.36 mg/dL — ABNORMAL HIGH (ref 0.44–1.00)
GFR, Estimated: 41 mL/min — ABNORMAL LOW (ref >60)
Glucose, Bld: 113 mg/dL — ABNORMAL HIGH (ref 70–99)
Potassium: 3.9 mmol/L (ref 3.5–5.1)
Sodium: 141 mmol/L (ref 135–145)
Total Bilirubin: 0.8 mg/dL (ref 0.3–1.2)
Total Protein: 7.2 g/dL (ref 6.5–8.1)

## 2022-03-09 LAB — URINALYSIS, ROUTINE W REFLEX MICROSCOPIC
Bilirubin Urine: NEGATIVE
Glucose, UA: NEGATIVE mg/dL
Hgb urine dipstick: NEGATIVE
Ketones, ur: NEGATIVE mg/dL
Leukocytes,Ua: NEGATIVE
Nitrite: POSITIVE — AB
Protein, ur: NEGATIVE mg/dL
Specific Gravity, Urine: 1.021 (ref 1.005–1.030)
pH: 7 (ref 5.0–8.0)

## 2022-03-09 LAB — CBC WITH DIFFERENTIAL/PLATELET
Abs Immature Granulocytes: 0.15 10*3/uL — ABNORMAL HIGH (ref 0.00–0.07)
Basophils Absolute: 0.1 10*3/uL (ref 0.0–0.1)
Basophils Relative: 0 %
Eosinophils Absolute: 0.1 10*3/uL (ref 0.0–0.5)
Eosinophils Relative: 0 %
HCT: 32.3 % — ABNORMAL LOW (ref 36.0–46.0)
Hemoglobin: 9.5 g/dL — ABNORMAL LOW (ref 12.0–15.0)
Immature Granulocytes: 1 %
Lymphocytes Relative: 7 %
Lymphs Abs: 1.3 10*3/uL (ref 0.7–4.0)
MCH: 23.5 pg — ABNORMAL LOW (ref 26.0–34.0)
MCHC: 29.4 g/dL — ABNORMAL LOW (ref 30.0–36.0)
MCV: 79.8 fL — ABNORMAL LOW (ref 80.0–100.0)
Monocytes Absolute: 0.7 10*3/uL (ref 0.1–1.0)
Monocytes Relative: 4 %
Neutro Abs: 16.4 10*3/uL — ABNORMAL HIGH (ref 1.7–7.7)
Neutrophils Relative %: 88 %
Platelets: 233 10*3/uL (ref 150–400)
RBC: 4.05 MIL/uL (ref 3.87–5.11)
RDW: 17 % — ABNORMAL HIGH (ref 11.5–15.5)
WBC: 18.7 10*3/uL — ABNORMAL HIGH (ref 4.0–10.5)
nRBC: 0 % (ref 0.0–0.2)

## 2022-03-09 LAB — RESP PANEL BY RT-PCR (FLU A&B, COVID) ARPGX2
Influenza A by PCR: NEGATIVE
Influenza B by PCR: NEGATIVE
SARS Coronavirus 2 by RT PCR: NEGATIVE

## 2022-03-09 LAB — TYPE AND SCREEN
ABO/RH(D): O NEG
Antibody Screen: NEGATIVE

## 2022-03-09 LAB — LIPASE, BLOOD: Lipase: 26 U/L (ref 11–51)

## 2022-03-09 MED ORDER — ONDANSETRON HCL 4 MG/2ML IJ SOLN
4.0000 mg | Freq: Four times a day (QID) | INTRAMUSCULAR | Status: DC | PRN
  Administered 2022-03-11 – 2022-03-13 (×3): 4 mg via INTRAVENOUS
  Filled 2022-03-09 (×3): qty 2

## 2022-03-09 MED ORDER — METRONIDAZOLE 500 MG/100ML IV SOLN
500.0000 mg | Freq: Two times a day (BID) | INTRAVENOUS | Status: DC
  Administered 2022-03-09 – 2022-03-13 (×8): 500 mg via INTRAVENOUS
  Filled 2022-03-09 (×8): qty 100

## 2022-03-09 MED ORDER — LATANOPROST 0.005 % OP SOLN
1.0000 [drp] | Freq: Every day | OPHTHALMIC | Status: DC
  Administered 2022-03-09 – 2022-03-14 (×6): 1 [drp] via OPHTHALMIC
  Filled 2022-03-09: qty 2.5

## 2022-03-09 MED ORDER — ONDANSETRON HCL 4 MG/2ML IJ SOLN
4.0000 mg | Freq: Once | INTRAMUSCULAR | Status: AC
  Administered 2022-03-09: 4 mg via INTRAVENOUS
  Filled 2022-03-09: qty 2

## 2022-03-09 MED ORDER — MELATONIN 5 MG PO TABS
10.0000 mg | ORAL_TABLET | Freq: Every evening | ORAL | Status: DC | PRN
  Administered 2022-03-12 – 2022-03-14 (×2): 10 mg via ORAL
  Filled 2022-03-09 (×2): qty 2

## 2022-03-09 MED ORDER — LACTATED RINGERS IV SOLN: INTRAVENOUS | Status: AC

## 2022-03-09 MED ORDER — ACETAMINOPHEN 325 MG PO TABS
650.0000 mg | ORAL_TABLET | Freq: Four times a day (QID) | ORAL | Status: DC | PRN
  Administered 2022-03-12 (×3): 650 mg via ORAL
  Filled 2022-03-09 (×3): qty 2

## 2022-03-09 MED ORDER — HEPARIN SODIUM (PORCINE) 5000 UNIT/ML IJ SOLN
5000.0000 [IU] | Freq: Three times a day (TID) | INTRAMUSCULAR | Status: DC
  Administered 2022-03-09 – 2022-03-15 (×18): 5000 [IU] via SUBCUTANEOUS
  Filled 2022-03-09 (×18): qty 1

## 2022-03-09 MED ORDER — PANTOPRAZOLE SODIUM 40 MG IV SOLR
40.0000 mg | Freq: Once | INTRAVENOUS | Status: AC
  Administered 2022-03-09: 40 mg via INTRAVENOUS
  Filled 2022-03-09: qty 10

## 2022-03-09 MED ORDER — ACETAMINOPHEN 650 MG RE SUPP: 650.0000 mg | Freq: Four times a day (QID) | RECTAL | Status: DC | PRN

## 2022-03-09 MED ORDER — SODIUM CHLORIDE 0.9 % IV SOLN
2.0000 g | Freq: Two times a day (BID) | INTRAVENOUS | Status: DC
  Administered 2022-03-10 – 2022-03-13 (×7): 2 g via INTRAVENOUS
  Filled 2022-03-09 (×7): qty 12.5

## 2022-03-09 MED ORDER — SODIUM CHLORIDE (PF) 0.9 % IJ SOLN
INTRAMUSCULAR | Status: AC
  Filled 2022-03-09: qty 50

## 2022-03-09 MED ORDER — SODIUM CHLORIDE 0.9 % IV BOLUS
1000.0000 mL | Freq: Once | INTRAVENOUS | Status: AC
  Administered 2022-03-09: 1000 mL via INTRAVENOUS

## 2022-03-09 MED ORDER — HYDROMORPHONE HCL 1 MG/ML IJ SOLN
0.5000 mg | INTRAMUSCULAR | Status: DC | PRN
  Administered 2022-03-09 – 2022-03-11 (×2): 0.5 mg via INTRAVENOUS
  Filled 2022-03-09 (×3): qty 0.5

## 2022-03-09 MED ORDER — IOHEXOL 300 MG/ML  SOLN
100.0000 mL | Freq: Once | INTRAMUSCULAR | Status: AC | PRN
Start: 2022-03-09 — End: 2022-03-09
  Administered 2022-03-09: 100 mL via INTRAVENOUS

## 2022-03-09 MED ORDER — DORZOLAMIDE HCL-TIMOLOL MAL 2-0.5 % OP SOLN
1.0000 [drp] | Freq: Two times a day (BID) | OPHTHALMIC | Status: DC
  Administered 2022-03-11 – 2022-03-13 (×4): 1 [drp] via OPHTHALMIC
  Filled 2022-03-09: qty 10

## 2022-03-09 MED ORDER — OXYCODONE HCL 5 MG PO TABS: 5.0000 mg | ORAL_TABLET | ORAL | Status: DC | PRN

## 2022-03-09 MED ORDER — CEFEPIME HCL 2 G IV SOLR
2.0000 g | Freq: Once | INTRAVENOUS | Status: AC
  Administered 2022-03-09: 2 g via INTRAVENOUS
  Filled 2022-03-09: qty 12.5

## 2022-03-09 MED ORDER — SODIUM CHLORIDE 0.9 % IV BOLUS
500.0000 mL | Freq: Once | INTRAVENOUS | Status: AC
  Administered 2022-03-09: 500 mL via INTRAVENOUS

## 2022-03-09 MED ORDER — PANTOPRAZOLE SODIUM 40 MG IV SOLR
40.0000 mg | Freq: Two times a day (BID) | INTRAVENOUS | Status: DC
  Administered 2022-03-09 – 2022-03-12 (×6): 40 mg via INTRAVENOUS
  Filled 2022-03-09 (×6): qty 10

## 2022-03-09 NOTE — ED Triage Notes (Signed)
Pt here from home with c/o n/v/d times 2 days ,stolls are jelly like according to pt and and  emesis is dark in color

## 2022-03-09 NOTE — ED Provider Triage Note (Signed)
Emergency Medicine Provider Triage Evaluation Note  Lynn Jenkins , a 76 y.o. female  was evaluated in triage.  Pt complains of nausea and vomiting.  She has been unable to tolerate any food or water.  She has been unable to take her medications including her thyroid medicine.  He says she has noticed some red streaks in her vomit states that the color is black.  She has associated abdominal pain. She has not had a BM since the symptoms started. Not on blood thinners  Review of Systems  Positive:  Negative:   Physical Exam  BP 121/66   Pulse 71   Temp 98.1 F (36.7 C)   Resp 18   Ht '5\' 5"'$  (1.651 m)   Wt 88.4 kg   SpO2 92%   BMI 32.43 kg/m  Gen:   Awake, no distress   Resp:  Normal effort  MSK:   Moves extremities without difficulty  Other:  Abdomen soft, upper abdominal tenderness, no guarding or rigidity  Medical Decision Making  Medically screening exam initiated at 4:15 PM.  Appropriate orders placed.  Lynn Jenkins was informed that the remainder of the evaluation will be completed by another provider, this initial triage assessment does not replace that evaluation, and the importance of remaining in the ED until their evaluation is complete.     Adolphus Birchwood, [place] 03/09/22 1617

## 2022-03-09 NOTE — ED Provider Notes (Signed)
[facility] DEPT Provider Note   CSN: [id number] Arrival date & time: 03/09/22  1552     History  Chief Complaint  Patient presents with   Abdominal Pain   Nausea    Lynn Jenkins is a 75 y.o. female.  76 year old female with prior medical history as detailed below presents for evaluation.  Patient reports 2 to 3 days of nausea and vomiting.  Patient reports multiple episodes of emesis.  Her last emesis was this morning.  She reports that she has noted that her vomit is dark with " granules" and she has noted dark loose stools as well.  Her last reported emesis was at 830 this morning.  She reports feeling very weak.  She denies fever.  She reports some intermittent abdominal pain especially after vomiting.  She does not take aspirin or blood thinners.  The history is provided by the patient and medical records.       Home Medications Prior to Admission medications   Medication Sig Start Date End Date Taking? Authorizing Provider  acetaminophen (TYLENOL) 500 MG tablet Take 500-1,000 mg by mouth every 6 (six) hours as needed for mild pain or moderate pain.    [provider]  brimonidine (ALPHAGAN) 0.2 % ophthalmic solution Place 1 drop into the left eye daily.    [provider]  Bromfenac Sodium (PROLENSA) 0.07 % SOLN Place 1 drop into the left eye daily.    [provider]  cholecalciferol (VITAMIN D) 1000 UNITS tablet Take 1,000 Units by mouth daily.    [provider]  co-enzyme Q-10 30 MG capsule Take 30 mg by mouth daily.    [provider]  Difluprednate (DUREZOL) 0.05 % EMUL Place 1 drop into the left eye in the morning and at bedtime.    [provider]  dorzolamide-timolol (COSOPT) 22.3-6.8 MG/ML ophthalmic solution Place 1 drop into both eyes 2 (two) times daily. 09/13/19   [provider]  esomeprazole (NEXIUM) 40 MG capsule Take 40 mg by mouth 2 (two) times daily before a  meal.     [provider]  fenofibrate 160 MG tablet Take 160 mg by mouth at bedtime.    [provider]  furosemide (LASIX) 20 MG tablet Take 20 mg by mouth daily as needed for fluid.  08/26/18   [provider]  Glucosamine-Chondroit-Vit C-Mn (GLUCOSAMINE 1500 COMPLEX) CAPS Take 1 capsule by mouth daily.    [provider]  hydroxychloroquine (PLAQUENIL) 200 MG tablet Take 400 mg by mouth daily.    [provider]  leflunomide (ARAVA) 10 MG tablet Take 10 mg by mouth daily.    [provider]  metoCLOPramide (REGLAN) 10 MG tablet Take 1 tablet (10 mg total) by mouth every 6 (six) hours as needed for nausea or vomiting. Patient taking differently: Take 5-10 mg by mouth every 6 (six) hours as needed for nausea or vomiting. 11/14/19   Hayden Rasmussen, MD  Misc Natural Products (TOTAL MEMORY & FOCUS FORMULA PO) Take 2 tablets by mouth daily.    [provider]  Multiple Vitamin (MULTIVITAMIN WITH MINERALS) TABS tablet Take 1 tablet by mouth daily.    [provider]  Multiple Vitamins-Minerals (HAIR/SKIN/NAILS) TABS Take 1 tablet by mouth daily.    [provider]  Netarsudil Dimesylate (RHOPRESSA) 0.02 % SOLN Place 1 drop into the left eye at bedtime.    [provider]  Omega 3 1000 MG CAPS Take 2,000 mg by  mouth 2 (two) times daily.    [provider]  ondansetron (ZOFRAN ODT) 4 MG disintegrating tablet Take 1 tablet (4 mg total) by mouth every 8 (eight) hours as needed for nausea or vomiting. Patient not taking: Reported on 10/20/2020 12/18/17   [name], MD  sertraline (ZOLOFT) 100 MG tablet Take 100 mg by mouth daily.    [provider]  SUMAtriptan (IMITREX) 100 MG tablet Take 100 mg by mouth every 2 (two) hours as needed for migraine or headache. 1 tab at onset of headache, may reoeat  In 2 hrs, max of 2 pills in 24 hrs 08/26/18   [provider]  traMADol (ULTRAM) 50 MG  tablet Take 1-2 tablets (50-100 mg total) by mouth every 6 (six) hours as needed for moderate pain. 11/01/20   Armandina Gemma, MD  vitamin B-12 (CYANOCOBALAMIN) 1000 MCG tablet Take 1,000 mcg by mouth daily.    [provider]  vitamin C (ASCORBIC ACID) 500 MG tablet Take 500 mg by mouth daily as needed (when sick).     [provider]  XALATAN 0.005 % ophthalmic solution Place 1 drop into both eyes at bedtime. 03/18/15   [provider]      Allergies    Doxycycline, Morphine and related, Zomig [zolmitriptan], Augmentin [amoxicillin-pot clavulanate], Ceftin [cefuroxime axetil], Gabapentin, Biaxin [clarithromycin], Cefdinir, and Sumatriptan    Review of Systems   Review of Systems  All other systems reviewed and are negative.   Physical Exam Updated Vital Signs BP 121/66   Pulse 71   Temp 98.1 F (36.7 C)   Resp 18   Ht '5\' 5"'$  (1.651 m)   Wt 88.4 kg   SpO2 92%   BMI 32.43 kg/m  Physical Exam Vitals and nursing note reviewed.  Constitutional:      General: She is not in acute distress.    Appearance: Normal appearance. She is well-developed.  HENT:     Head: Normocephalic and atraumatic.  Eyes:     Conjunctiva/sclera: Conjunctivae normal.     Pupils: Pupils are equal, round, and reactive to light.  Cardiovascular:     Rate and Rhythm: Normal rate and regular rhythm.     Heart sounds: Normal heart sounds.  Pulmonary:     Effort: Pulmonary effort is normal. No respiratory distress.     Breath sounds: Normal breath sounds.  Abdominal:     General: There is no distension.     Palpations: Abdomen is soft.     Tenderness: There is abdominal tenderness in the right upper quadrant and epigastric area.  Musculoskeletal:        General: No deformity. Normal range of motion.     Cervical back: Normal range of motion and neck supple.  Skin:    General: Skin is warm and dry.  Neurological:     General: No focal deficit present.     Mental Status: She is  alert and oriented to person, place, and time.     ED Results / Procedures / Treatments   Labs (all labs ordered are listed, but only abnormal results are displayed) Labs Reviewed  CBC WITH DIFFERENTIAL/PLATELET - Abnormal; Notable for the following components:      Result Value   WBC 18.7 (*)    Hemoglobin 9.5 (*)    HCT 32.3 (*)    MCV 79.8 (*)    MCH 23.5 (*)    MCHC 29.4 (*)    RDW 17.0 (*)    Neutro  Abs 16.4 (*)    Abs Immature Granulocytes 0.15 (*)    All other components within normal limits  COMPREHENSIVE METABOLIC PANEL  LIPASE, BLOOD  URINALYSIS, ROUTINE W REFLEX MICROSCOPIC  POC OCCULT BLOOD, ED  TYPE AND SCREEN    EKG None  Radiology No results found.  Procedures Procedures    Medications Ordered in ED Medications  pantoprazole (PROTONIX) injection 40 mg (has no administration in time range)  sodium chloride 0.9 % bolus 500 mL (has no administration in time range)    ED Course/ Medical Decision Making/ A&P                           Medical Decision Making Amount and/or Complexity of Data Reviewed Radiology: ordered.  Risk Prescription drug management. Decision regarding hospitalization.    Medical Screen Complete  This patient presented to the ED with complaint of nausea, vomiting, abdominal pain.  This complaint involves an extensive number of treatment options. The initial differential diagnosis includes, but is not limited to, intra-abdominal infection or other pathology, etc.  This presentation is: Acute, Self-Limited, Previously Undiagnosed, Uncertain Prognosis, Complicated, Systemic Symptoms, and Threat to Life/Bodily Function  Is presenting with abdominal pain, nausea, vomiting.  Patient reports tenderness overlying the epigastrium and right upper quadrant.    Patient with notable elevated white count at 18.9.  LFTs are normal.  Renal function is at baseline.  CT abdomen pelvis demonstrates pneumobilia.  Patient without  prior/recent instrumentation.  Patient is not status postcholecystectomy.  Out of concern for possible cholangitis antibiotics administered.  Case discussed briefly with general surgery Dr. Ninfa Linden.  Surgery will consult.  Hospitalist service aware of case and will evaluate for admission.   Additional history obtained: External records from outside sources obtained and reviewed including prior ED visits and prior Inpatient records.    Lab Tests:  I ordered and personally interpreted labs.  The pertinent results include: CBC, CMP, lipase, COVID, flu   Imaging Studies ordered:  I ordered imaging studies including CT abdomen pelvis, right upper quadrant ultrasound I independently visualized and interpreted obtained imaging which showed pneumobilia I agree with the radiologist interpretation.   Cardiac Monitoring:  The patient was maintained on a cardiac monitor.  I personally viewed and interpreted the cardiac monitor which showed an underlying rhythm of: NSR   Medicines ordered:  I ordered medication including fluids, antibiotics, antiemetics for dehydration, nausea, presumed infection Reevaluation of the patient after these medicines showed that the patient: improved   Problem List / ED Course:  Nausea, Vomiting, Abdominal pain, Pneumobilia    Reevaluation:  After the interventions noted above, I reevaluated the patient and found that they have: stayed the same   Disposition:  After consideration of the diagnostic results and the patients response to treatment, I feel that the patent would benefit from admission.          Final Clinical Impression(s) / ED Diagnoses Final diagnoses:  Pneumobilia  Nausea and vomiting, unspecified vomiting type    Rx / DC Orders ED Discharge Orders     None         Valarie Merino, MD 03/09/22 2034

## 2022-03-09 NOTE — Assessment & Plan Note (Signed)
Continue eyedrops 

## 2022-03-09 NOTE — H&P (Signed)
History and Physical    Lynn Jenkins QJJ:[id number] DOB: 1946/05/27 DOA: 03/09/2022  DOS: the patient was seen and examined on 03/09/2022  PCP: Lawerance Cruel, MD   Patient coming from: Home  I have personally briefly reviewed patient's old medical records in [place]  CC: N/V, abd pain HPI: 76 year old Hispanic female history of primary hyperparathyroidism, glaucoma presents to the ER today with a 3-day history of nausea, vomiting, abdominal pain.  She states that she has episodes of nausea vomiting abdominal pain at least once or twice a week.  These episodes last 2 to 3 days at a time.  She uses Reglan for this.  She states that she has a disabled husband at home.  She has not had this evaluated because she knows that she probably needs her gallbladder taken out.  She has been avoiding this.  Today, she had vomiting in the bathroom.  Her husband saw her.  He told the patient she has to go to the hospital get this evaluated.  She is very concerned about her husband at home.  She states that he is disabled and uses a walker.  She does not have any family to help her.  In the ER, temp 98.1 heart rate 71 blood pressure 121/66.  Labs showed a white count of 18.7, hemoglobin 9.5, platelets 233  Lipase normal at 26  Sodium 141 potassium 3.9 BUN of 30 creatinine 1.36 calcium 11.7  CT scan abdomen pelvis demonstrated pneumobilia.  She has a large hiatal hernia.  Right upper quadrant ultrasound was performed.  I was present at the time the ultrasound tech was performing this.  Pneumobilia was seen.  She also has a large gallstone.  Awaiting final read on her ultrasound.  [facility] hospitalist contacted for admission.   ED Course: CT pelvis demonstrated pneumobilia.  White count 18.7.  IV cefepime and Flagyl started.  Review of Systems:  Review of Systems  Constitutional:  Negative for chills and fever.  HENT: Negative.    Eyes: Negative.   Respiratory: Negative.     Cardiovascular: Negative.   Gastrointestinal:  Positive for abdominal pain, nausea and vomiting. Negative for blood in stool and melena.  Genitourinary: Negative.   Musculoskeletal: Negative.   Skin: Negative.   Neurological: Negative.   Endo/Heme/Allergies: Negative.   Psychiatric/Behavioral: Negative.    All other systems reviewed and are negative.   Past Medical History:  Diagnosis Date   Anemia    Arthritis    Generalized anxiety disorder    GERD (gastroesophageal reflux disease)    Glaucoma    Headache    hx of migraines    History of hiatal hernia    Hyperthyroidism    Lupus (HCC)    Migraine    Mixed hyperlipidemia    Multinodular goiter    Osteopenia    Primary hyperparathyroidism ([place])    Varicose veins    Vitamin D deficiency     Past Surgical History:  Procedure Laterality Date   ABDOMINAL HYSTERECTOMY     APPENDECTOMY     JOINT REPLACEMENT     left foot surgery      PARATHYROIDECTOMY N/A 11/01/2014   Procedure: NECK EXPLORATION AND PARATHYROIDECTOMY;  Surgeon: Armandina Gemma, MD;  Location: [facility] ORS;  Service: General;  Laterality: N/A;   PARATHYROIDECTOMY N/A 10/31/2020   Procedure: PARATHYROIDECTOMY WITH LEFT THYROID LOBECTOMY;  Surgeon: Armandina Gemma, MD;  Location: [facility] OR[facility];  Service: General;  Laterality: N/A;   SHOULDER SURGERY  reports that she has never smoked. She has never used smokeless tobacco. She reports that she does not drink alcohol and does not use drugs.  Allergies  Allergen Reactions   Doxycycline Swelling    Facial swelling, itching   Morphine And Related Swelling   Zomig [Zolmitriptan] Nausea And Vomiting    Increased heart rate    Augmentin [Amoxicillin-Pot Clavulanate] Diarrhea   Ceftin [Cefuroxime Axetil] Other (See Comments)    Stomach ache   Gabapentin Other (See Comments)    Confusion, off balance   Biaxin [Clarithromycin] Rash   Cefdinir Rash   Sumatriptan Other (See Comments)    Can use brand Imitrex    Family  History  Problem Relation Age of Onset   Heart disease Mother    Hyperlipidemia Mother    Hypertension Mother    Heart disease Father    Hyperlipidemia Father    Hypertension Father    Cancer Brother    Hypertension Brother     Prior to Admission medications   Medication Sig Start Date End Date Taking? Authorizing Provider  acetaminophen (TYLENOL) 500 MG tablet Take 500-1,000 mg by mouth every 6 (six) hours as needed for mild pain or moderate pain.    [provider]  brimonidine (ALPHAGAN) 0.2 % ophthalmic solution Place 1 drop into the left eye daily.    [provider]  Bromfenac Sodium (PROLENSA) 0.07 % SOLN Place 1 drop into the left eye daily.    [provider]  cholecalciferol (VITAMIN D) 1000 UNITS tablet Take 1,000 Units by mouth daily.    [provider]  co-enzyme Q-10 30 MG capsule Take 30 mg by mouth daily.    [provider]  Difluprednate (DUREZOL) 0.05 % EMUL Place 1 drop into the left eye in the morning and at bedtime.    [provider]  dorzolamide-timolol (COSOPT) 22.3-6.8 MG/ML ophthalmic solution Place 1 drop into both eyes 2 (two) times daily. 09/13/19   [provider]  esomeprazole (NEXIUM) 40 MG capsule Take 40 mg by mouth 2 (two) times daily before a meal.     [provider]  fenofibrate 160 MG tablet Take 160 mg by mouth at bedtime.    [provider]  furosemide (LASIX) 20 MG tablet Take 20 mg by mouth daily as needed for fluid.  08/26/18   [provider]  Glucosamine-Chondroit-Vit C-Mn (GLUCOSAMINE 1500 COMPLEX) CAPS Take 1 capsule by mouth daily.    [provider]  hydroxychloroquine (PLAQUENIL) 200 MG tablet Take 400 mg by mouth daily.    [provider]  leflunomide (ARAVA) 10 MG tablet Take 10 mg by mouth daily.    [provider]  metoCLOPramide (REGLAN) 10 MG tablet Take 1 tablet (10 mg total) by mouth every 6 (six) hours as needed for  nausea or vomiting. Patient taking differently: Take 5-10 mg by mouth every 6 (six) hours as needed for nausea or vomiting. 11/14/19   Hayden Rasmussen, MD  Misc Natural Products (TOTAL MEMORY & FOCUS FORMULA PO) Take 2 tablets by mouth daily.    [provider]  Multiple Vitamin (MULTIVITAMIN WITH MINERALS) TABS tablet Take 1 tablet by mouth daily.    [provider]  Multiple Vitamins-Minerals (HAIR/SKIN/NAILS) TABS Take 1 tablet by mouth daily.    [provider]  Netarsudil Dimesylate (RHOPRESSA) 0.02 % SOLN Place 1 drop into the left eye at bedtime.    [provider]  Omega 3 1000 MG CAPS Take  2,000 mg by mouth 2 (two) times daily.    [provider]  ondansetron (ZOFRAN ODT) 4 MG disintegrating tablet Take 1 tablet (4 mg total) by mouth every 8 (eight) hours as needed for nausea or vomiting. Patient not taking: Reported on 10/20/2020 12/18/17   [name], MD  sertraline (ZOLOFT) 100 MG tablet Take 100 mg by mouth daily.    [provider]  SUMAtriptan (IMITREX) 100 MG tablet Take 100 mg by mouth every 2 (two) hours as needed for migraine or headache. 1 tab at onset of headache, may reoeat  In 2 hrs, max of 2 pills in 24 hrs 08/26/18   [provider]  traMADol (ULTRAM) 50 MG tablet Take 1-2 tablets (50-100 mg total) by mouth every 6 (six) hours as needed for moderate pain. 11/01/20   Armandina Gemma, MD  vitamin B-12 (CYANOCOBALAMIN) 1000 MCG tablet Take 1,000 mcg by mouth daily.    [provider]  vitamin C (ASCORBIC ACID) 500 MG tablet Take 500 mg by mouth daily as needed (when sick).     [provider]  XALATAN 0.005 % ophthalmic solution Place 1 drop into both eyes at bedtime. 03/18/15   [provider]    Physical Exam: Vitals:   03/09/22 1804 03/09/22 1924 03/09/22 1930 03/09/22 1945  BP: 127/80 138/73 137/83 (!) 131/92  Pulse: 76 75 74 77  Resp: 18 17    Temp:  98.5 F (36.9 C)    TempSrc:   Oral    SpO2: 94% 96% 94% 93%  Weight:      Height:        Physical Exam Vitals and nursing note reviewed.  Constitutional:      General: She is not in acute distress.    Appearance: Normal appearance. She is obese. She is not ill-appearing, toxic-appearing or diaphoretic.  HENT:     Head: Normocephalic and atraumatic.     Nose: Nose normal.  Eyes:     General: No scleral icterus. Cardiovascular:     Rate and Rhythm: Normal rate and regular rhythm.     Pulses: Normal pulses.  Pulmonary:     Effort: Pulmonary effort is normal. No respiratory distress.     Breath sounds: Normal breath sounds. No wheezing or rales.  Abdominal:     General: Bowel sounds are normal. There is no distension.     Tenderness: There is abdominal tenderness in the right upper quadrant. There is no guarding or rebound.  Musculoskeletal:     Right lower leg: No edema.     Left lower leg: No edema.  Skin:    General: Skin is warm and dry.     Capillary Refill: Capillary refill takes less than 2 seconds.  Neurological:     General: No focal deficit present.     Mental Status: She is alert and oriented to person, place, and time.      Labs on Admission: I have personally reviewed following labs and imaging studies  CBC: Recent Labs  Lab 03/09/22 1646  WBC 18.7*  NEUTROABS 16.4*  HGB 9.5*  HCT 32.3*  MCV 79.8*  PLT 194   Basic Metabolic Panel: Recent Labs  Lab 03/09/22 1646  NA 141  K 3.9  CL 109  CO2 26  GLUCOSE 113*  BUN 30*  CREATININE 1.36*  CALCIUM 11.7*   GFR: Estimated Creatinine Clearance: 39.3 mL/min (A) (by C-G formula based on SCr of 1.36 mg/dL (H)). Liver Function Tests: Recent Labs  Lab 03/09/22 1646  AST 15  ALT 16  ALKPHOS 78  BILITOT 0.8  PROT 7.2  ALBUMIN 3.6   Recent Labs  Lab 03/09/22 1646  LIPASE 26   No results for input(s): "AMMONIA" in the last 168 hours. Coagulation Profile: No results for input(s): "INR", "PROTIME" in the last 168  hours. Cardiac Enzymes: No results for input(s): "CKTOTAL", "CKMB", "CKMBINDEX", "TROPONINI", "TROPONINIHS" in the last 168 hours. BNP (last 3 results) No results for input(s): "PROBNP" in the last 8760 hours. HbA1C: No results for input(s): "HGBA1C" in the last 72 hours. CBG: No results for input(s): "GLUCAP" in the last 168 hours. Lipid Profile: No results for input(s): "CHOL", "HDL", "LDLCALC", "TRIG", "CHOLHDL", "LDLDIRECT" in the last 72 hours. Thyroid Function Tests: No results for input(s): "TSH", "T4TOTAL", "FREET4", "T3FREE", "THYROIDAB" in the last 72 hours. Anemia Panel: No results for input(s): "VITAMINB12", "FOLATE", "FERRITIN", "TIBC", "IRON", "RETICCTPCT" in the last 72 hours. Urine analysis:    Component Value Date/Time   COLORURINE STRAW (A) 03/09/2022 1954   APPEARANCEUR CLEAR 03/09/2022 1954   LABSPEC 1.021 03/09/2022 1954   PHURINE 7.0 03/09/2022 1954   GLUCOSEU NEGATIVE 03/09/2022 1954   HGBUR NEGATIVE 03/09/2022 1954   BILIRUBINUR NEGATIVE 03/09/2022 1954   KETONESUR NEGATIVE 03/09/2022 1954   PROTEINUR NEGATIVE 03/09/2022 1954   NITRITE POSITIVE (A) 03/09/2022 1954   LEUKOCYTESUR NEGATIVE 03/09/2022 1954    Radiological Exams on Admission: I have personally reviewed images CT ABDOMEN PELVIS W CONTRAST  Result Date: 03/09/2022 CLINICAL DATA:  Bowel obstruction suspected, vomiting for 3 days, abdominal pain EXAM: CT ABDOMEN AND PELVIS WITH CONTRAST TECHNIQUE: Multidetector CT imaging of the abdomen and pelvis was performed using the standard protocol following bolus administration of intravenous contrast. RADIATION DOSE REDUCTION: This exam was performed according to the departmental dose-optimization program which includes automated exposure control, adjustment of the mA and/or kV according to patient size and/or use of iterative reconstruction technique. CONTRAST:  126m OMNIPAQUE IOHEXOL 300 MG/ML  SOLN COMPARISON:  None Available. FINDINGS: Lower chest:  Heterogeneous and ground-glass airspace opacity throughout the lungs spaces, with underlying bandlike scarring and or atelectasis (series 6, image 5). Large, partially imaged hiatal hernia with intrathoracic position of the gastric body and fundus. Hepatobiliary: No solid liver abnormality is seen. Hepatic steatosis. Pneumobilia, presumably postprocedural. No gallstones, gallbladder wall thickening, or biliary dilatation. Pancreas: Unremarkable. No pancreatic ductal dilatation or surrounding inflammatory changes. Spleen: Normal in size without significant abnormality. Adrenals/Urinary Tract: Adrenal glands are unremarkable. Kidneys are normal, without renal calculi, solid lesion, or hydronephrosis. Bladder is unremarkable. Stomach/Bowel: Stomach is within normal limits. Appendix is not clearly visualized and may be surgically absent. No evidence of bowel wall thickening, distention, or inflammatory changes. Sigmoid diverticulosis. Vascular/Lymphatic: Aortic atherosclerosis. No enlarged abdominal or pelvic lymph nodes. Reproductive: Status post hysterectomy. Other: No abdominal wall hernia or abnormality. No ascites. Musculoskeletal: No acute or significant osseous findings. IMPRESSION: 1. No acute CT findings of the abdomen or pelvis to explain abdominal pain nausea, or vomiting. No evidence of bowel obstruction. 2. Heterogeneous and ground-glass airspace opacity throughout the lung bases, consistent with infection or aspiration. 3. Large, partially imaged hiatal hernia with intrathoracic position of the gastric body and fundus. 4. Hepatic steatosis. 5. Sigmoid diverticulosis without evidence of acute diverticulitis. Aortic Atherosclerosis (ICD10-I70.0). Electronically Signed   By: ADelanna AhmadiM.D.   On: 03/09/2022 19:04    EKG: My personal interpretation of EKG shows: no EKG  Assessment/Plan Principal Problem:   Acute cholecystitis Active Problems:  Hyperparathyroidism, primary ([place])   Glaucoma     Assessment and Plan: * Acute cholecystitis Observation med/surg bed. Continue IV cefepime/flagyl. Secure chat left with general surgery for consult in AM for acute cholecystitis.  Concern for acute cholangitis due to pneumobilia.  Repeat CBC, CMP in the morning.  Hyperparathyroidism, primary ([place]) Continue with IVF  Glaucoma Continue eye drops   DVT prophylaxis: SQ Heparin Code Status: Full Code Family Communication: no family at bedside  Disposition Plan: return home  Consults called: left secure chat for general surgery for consult in AM  Admission status: Observation, Med-Surg   Kristopher Oppenheim, DO [facility] 03/09/2022, 8:21 PM

## 2022-03-09 NOTE — Assessment & Plan Note (Addendum)
Observation med/surg bed. Continue IV cefepime/flagyl. Secure chat left with general surgery for consult in AM for acute cholecystitis.  Concern for acute cholangitis due to pneumobilia.  Repeat CBC, CMP in the morning.

## 2022-03-09 NOTE — Progress Notes (Signed)
Pharmacy Antibiotic Note  Lynn Jenkins is a 76 y.o. female admitted on 03/09/2022. Pharmacy has been consulted for cefepime dosing.  Plan: Cefepime 2 g IV q12h  Height: '5\' 5"'$  (165.1 cm) Weight: 88.4 kg (194 lb 14.4 oz) IBW/kg (Calculated) : 57  Temp (24hrs), Avg:98.3 F (36.8 C), Min:98.1 F (36.7 C), Max:98.5 F (36.9 C)  Recent Labs  Lab 03/09/22 1646  WBC 18.7*  CREATININE 1.36*    Estimated Creatinine Clearance: 39.3 mL/min (A) (by C-G formula based on SCr of 1.36 mg/dL (H)).    Allergies  Allergen Reactions   Doxycycline Swelling    Facial swelling, itching   Morphine And Related Swelling   Zomig [Zolmitriptan] Nausea And Vomiting    Increased heart rate    Augmentin [Amoxicillin-Pot Clavulanate] Diarrhea   Ceftin [Cefuroxime Axetil] Other (See Comments)    Stomach ache   Gabapentin Other (See Comments)    Confusion, off balance   Biaxin [Clarithromycin] Rash   Cefdinir Rash   Sumatriptan Other (See Comments)    Can use brand Imitrex    Antimicrobials this admission: Cefepime 9/30 >> Flagyl 9/30 >>  Microbiology results: NA  Thank you for allowing pharmacy to be a part of this patient's care.  Tawnya Crook 03/09/2022 8:19 PM

## 2022-03-09 NOTE — Assessment & Plan Note (Signed)
Continue with IVF ?

## 2022-03-09 NOTE — Progress Notes (Signed)
PHARMACY -  BRIEF ANTIBIOTIC NOTE   Pharmacy has received consult(s) for cefepime from an ED provider.  The patient's profile has been reviewed for ht/wt/allergies/indication/available labs.    One time order(s) placed for cefepime 2 g  Further antibiotics/pharmacy consults should be ordered by admitting physician if indicated.                       Thank you,  Tawnya Crook, PharmD, BCPS Clinical Pharmacist 03/09/2022 7:52 PM

## 2022-03-09 NOTE — Subjective & Objective (Signed)
CC: N/V, abd pain HPI: 76 year old Hispanic female history of primary hyperparathyroidism, glaucoma presents to the ER today with a 3-day history of nausea, vomiting, abdominal pain.  She states that she has episodes of nausea vomiting abdominal pain at least once or twice a week.  These episodes last 2 to 3 days at a time.  She uses Reglan for this.  She states that she has a disabled husband at home.  She has not had this evaluated because she knows that she probably needs her gallbladder taken out.  She has been avoiding this.  Today, she had vomiting in the bathroom.  Her husband saw her.  He told the patient she has to go to the hospital get this evaluated.  She is very concerned about her husband at home.  She states that he is disabled and uses a walker.  She does not have any family to help her.  In the ER, temp 98.1 heart rate 71 blood pressure 121/66.  Labs showed a white count of 18.7, hemoglobin 9.5, platelets 233  Lipase normal at 26  Sodium 141 potassium 3.9 BUN of 30 creatinine 1.36 calcium 11.7  CT scan abdomen pelvis demonstrated pneumobilia.  She has a large hiatal hernia.  Right upper quadrant ultrasound was performed.  I was present at the time the ultrasound tech was performing this.  Pneumobilia was seen.  She also has a large gallstone.  Awaiting final read on her ultrasound.  [facility] hospitalist contacted for admission.

## 2022-03-10 ENCOUNTER — Observation Stay (HOSPITAL_COMMUNITY): Payer: Medicare Other

## 2022-03-10 DIAGNOSIS — E210 Primary hyperparathyroidism: Secondary | ICD-10-CM | POA: Diagnosis present

## 2022-03-10 DIAGNOSIS — E559 Vitamin D deficiency, unspecified: Secondary | ICD-10-CM | POA: Diagnosis present

## 2022-03-10 DIAGNOSIS — Z79899 Other long term (current) drug therapy: Secondary | ICD-10-CM | POA: Diagnosis not present

## 2022-03-10 DIAGNOSIS — Z83438 Family history of other disorder of lipoprotein metabolism and other lipidemia: Secondary | ICD-10-CM | POA: Diagnosis not present

## 2022-03-10 DIAGNOSIS — D649 Anemia, unspecified: Secondary | ICD-10-CM | POA: Diagnosis not present

## 2022-03-10 DIAGNOSIS — G43009 Migraine without aura, not intractable, without status migrainosus: Secondary | ICD-10-CM | POA: Diagnosis present

## 2022-03-10 DIAGNOSIS — Z885 Allergy status to narcotic agent status: Secondary | ICD-10-CM | POA: Diagnosis not present

## 2022-03-10 DIAGNOSIS — K819 Cholecystitis, unspecified: Secondary | ICD-10-CM | POA: Diagnosis not present

## 2022-03-10 DIAGNOSIS — D509 Iron deficiency anemia, unspecified: Secondary | ICD-10-CM | POA: Diagnosis present

## 2022-03-10 DIAGNOSIS — R112 Nausea with vomiting, unspecified: Secondary | ICD-10-CM

## 2022-03-10 DIAGNOSIS — G43001 Migraine without aura, not intractable, with status migrainosus: Secondary | ICD-10-CM

## 2022-03-10 DIAGNOSIS — Z6834 Body mass index (BMI) 34.0-34.9, adult: Secondary | ICD-10-CM | POA: Diagnosis not present

## 2022-03-10 DIAGNOSIS — K810 Acute cholecystitis: Secondary | ICD-10-CM | POA: Diagnosis present

## 2022-03-10 DIAGNOSIS — K219 Gastro-esophageal reflux disease without esophagitis: Secondary | ICD-10-CM | POA: Diagnosis present

## 2022-03-10 DIAGNOSIS — F411 Generalized anxiety disorder: Secondary | ICD-10-CM | POA: Diagnosis present

## 2022-03-10 DIAGNOSIS — E782 Mixed hyperlipidemia: Secondary | ICD-10-CM | POA: Diagnosis present

## 2022-03-10 DIAGNOSIS — E038 Other specified hypothyroidism: Secondary | ICD-10-CM | POA: Diagnosis not present

## 2022-03-10 DIAGNOSIS — K828 Other specified diseases of gallbladder: Secondary | ICD-10-CM | POA: Diagnosis present

## 2022-03-10 DIAGNOSIS — E669 Obesity, unspecified: Secondary | ICD-10-CM | POA: Diagnosis present

## 2022-03-10 DIAGNOSIS — K838 Other specified diseases of biliary tract: Secondary | ICD-10-CM | POA: Diagnosis not present

## 2022-03-10 DIAGNOSIS — K660 Peritoneal adhesions (postprocedural) (postinfection): Secondary | ICD-10-CM | POA: Diagnosis present

## 2022-03-10 DIAGNOSIS — K8010 Calculus of gallbladder with chronic cholecystitis without obstruction: Secondary | ICD-10-CM | POA: Diagnosis present

## 2022-03-10 DIAGNOSIS — N179 Acute kidney failure, unspecified: Secondary | ICD-10-CM | POA: Diagnosis not present

## 2022-03-10 DIAGNOSIS — Z8249 Family history of ischemic heart disease and other diseases of the circulatory system: Secondary | ICD-10-CM | POA: Diagnosis not present

## 2022-03-10 DIAGNOSIS — H409 Unspecified glaucoma: Secondary | ICD-10-CM | POA: Diagnosis present

## 2022-03-10 DIAGNOSIS — E039 Hypothyroidism, unspecified: Secondary | ICD-10-CM | POA: Diagnosis present

## 2022-03-10 DIAGNOSIS — Z888 Allergy status to other drugs, medicaments and biological substances status: Secondary | ICD-10-CM | POA: Diagnosis not present

## 2022-03-10 DIAGNOSIS — Z20822 Contact with and (suspected) exposure to covid-19: Secondary | ICD-10-CM | POA: Diagnosis present

## 2022-03-10 DIAGNOSIS — Z96662 Presence of left artificial ankle joint: Secondary | ICD-10-CM | POA: Diagnosis present

## 2022-03-10 DIAGNOSIS — N281 Cyst of kidney, acquired: Secondary | ICD-10-CM | POA: Diagnosis not present

## 2022-03-10 LAB — COMPREHENSIVE METABOLIC PANEL
ALT: 12 U/L (ref 0–44)
AST: 12 U/L — ABNORMAL LOW (ref 15–41)
Albumin: 2.6 g/dL — ABNORMAL LOW (ref 3.5–5.0)
Alkaline Phosphatase: 60 U/L (ref 38–126)
Anion gap: 4 — ABNORMAL LOW (ref 5–15)
BUN: 22 mg/dL (ref 8–23)
CO2: 25 mmol/L (ref 22–32)
Calcium: 10.5 mg/dL — ABNORMAL HIGH (ref 8.9–10.3)
Chloride: 113 mmol/L — ABNORMAL HIGH (ref 98–111)
Creatinine, Ser: 1.23 mg/dL — ABNORMAL HIGH (ref 0.44–1.00)
GFR, Estimated: 46 mL/min — ABNORMAL LOW (ref >60)
Glucose, Bld: 99 mg/dL (ref 70–99)
Potassium: 4 mmol/L (ref 3.5–5.1)
Sodium: 142 mmol/L (ref 135–145)
Total Bilirubin: 0.5 mg/dL (ref 0.3–1.2)
Total Protein: 5.9 g/dL — ABNORMAL LOW (ref 6.5–8.1)

## 2022-03-10 LAB — CBC WITH DIFFERENTIAL/PLATELET
Abs Immature Granulocytes: 0.1 10*3/uL — ABNORMAL HIGH (ref 0.00–0.07)
Basophils Absolute: 0 10*3/uL (ref 0.0–0.1)
Basophils Relative: 0 %
Eosinophils Absolute: 0.1 10*3/uL (ref 0.0–0.5)
Eosinophils Relative: 1 %
HCT: 27.7 % — ABNORMAL LOW (ref 36.0–46.0)
Hemoglobin: 8.2 g/dL — ABNORMAL LOW (ref 12.0–15.0)
Immature Granulocytes: 1 %
Lymphocytes Relative: 9 %
Lymphs Abs: 1.3 10*3/uL (ref 0.7–4.0)
MCH: 24 pg — ABNORMAL LOW (ref 26.0–34.0)
MCHC: 29.6 g/dL — ABNORMAL LOW (ref 30.0–36.0)
MCV: 81 fL (ref 80.0–100.0)
Monocytes Absolute: 0.6 10*3/uL (ref 0.1–1.0)
Monocytes Relative: 4 %
Neutro Abs: 11.6 10*3/uL — ABNORMAL HIGH (ref 1.7–7.7)
Neutrophils Relative %: 85 %
Platelets: 190 10*3/uL (ref 150–400)
RBC: 3.42 MIL/uL — ABNORMAL LOW (ref 3.87–5.11)
RDW: 17.1 % — ABNORMAL HIGH (ref 11.5–15.5)
WBC: 13.6 10*3/uL — ABNORMAL HIGH (ref 4.0–10.5)
nRBC: 0 % (ref 0.0–0.2)

## 2022-03-10 MED ORDER — LEVOTHYROXINE SODIUM 88 MCG PO TABS
88.0000 ug | ORAL_TABLET | Freq: Every day | ORAL | Status: DC
  Administered 2022-03-10 – 2022-03-15 (×5): 88 ug via ORAL
  Filled 2022-03-10 (×6): qty 1

## 2022-03-10 MED ORDER — SODIUM CHLORIDE 0.9 % IV SOLN
25.0000 mg | Freq: Once | INTRAVENOUS | Status: AC
  Administered 2022-03-10: 25 mg via INTRAVENOUS
  Filled 2022-03-10: qty 1

## 2022-03-10 MED ORDER — SUMATRIPTAN SUCCINATE 6 MG/0.5ML ~~LOC~~ SOLN
6.0000 mg | Freq: Once | SUBCUTANEOUS | Status: AC
  Administered 2022-03-10: 6 mg via SUBCUTANEOUS
  Filled 2022-03-10: qty 0.5

## 2022-03-10 MED ORDER — GADOPICLENOL 0.5 MMOL/ML IV SOLN
9.0000 mL | Freq: Once | INTRAVENOUS | Status: AC | PRN
  Administered 2022-03-10: 9 mL via INTRAVENOUS

## 2022-03-10 NOTE — Consult Note (Addendum)
Reason for Consult:pneumobilia, gallstones Referring Physician: Dr. Philis Pique  Lynn Jenkins is an 76 y.o. female.  HPI: This is a pleasant 76 year old female who presented with a several day history of nausea, vomiting, and epigastric abdominal pain.  She may have had some diarrhea.  She reports she has been having milder attacks over the past year.  She denies jaundice.  She has had no previous abdominal surgery.  She has had no other abdominal procedures.  She does report a cough since her visit to the emergency department for pneumonia last month.  In the emergency department, she had a CT scan which was unremarkable with no intra-abdominal laboratory process but it did show pneumobilia.  She had an elevated white blood count.  Her liver function test were normal.  She describes her pain as sharp and moderate as well as intermittent.  Past Medical History:  Diagnosis Date   Anemia    Arthritis    Generalized anxiety disorder    GERD (gastroesophageal reflux disease)    Glaucoma    Headache    hx of migraines    History of hiatal hernia    Hyperthyroidism    Lupus (HCC)    Migraine    Mixed hyperlipidemia    Multinodular goiter    Osteopenia    Primary hyperparathyroidism ([facility])    Varicose veins    Vitamin D deficiency     Past Surgical History:  Procedure Laterality Date   ABDOMINAL HYSTERECTOMY     APPENDECTOMY     JOINT REPLACEMENT     left foot surgery      PARATHYROIDECTOMY N/A 11/01/2014   Procedure: NECK EXPLORATION AND PARATHYROIDECTOMY;  Surgeon: Armandina Gemma, MD;  Location: [facility];  Service: General;  Laterality: N/A;   PARATHYROIDECTOMY N/A 10/31/2020   Procedure: PARATHYROIDECTOMY WITH LEFT THYROID LOBECTOMY;  Surgeon: Armandina Gemma, MD;  Location: [facility];  Service: General;  Laterality: N/A;   SHOULDER SURGERY      Family History  Problem Relation Age of Onset   Heart disease Mother    Hyperlipidemia Mother    Hypertension Mother    Heart disease Father     Hyperlipidemia Father    Hypertension Father    Cancer Brother    Hypertension Brother     Social History:  reports that she has never smoked. She has never used smokeless tobacco. She reports that she does not drink alcohol and does not use drugs.  Allergies:  Allergies  Allergen Reactions   Doxycycline Swelling    Facial swelling, itching   Morphine And Related Swelling   Zomig [Zolmitriptan] Nausea And Vomiting    Increased heart rate    Augmentin [Amoxicillin-Pot Clavulanate] Diarrhea   Ceftin [Cefuroxime Axetil] Other (See Comments)    Stomach ache   Gabapentin Other (See Comments)    Confusion, off balance   Biaxin [Clarithromycin] Rash   Cefdinir Rash   Sumatriptan Other (See Comments)    Can use brand Imitrex    Medications: I have reviewed the patient's current medications.  Results for orders placed or performed during the hospital encounter of 03/09/22 (from the past 48 hour(s))  CBC with Differential     Status: Abnormal   Collection Time: 03/09/22  4:46 PM  Result Value Ref Range   WBC 18.7 (H) 4.0 - 10.5 K/uL   RBC 4.05 3.87 - 5.11 MIL/uL   Hemoglobin 9.5 (L) 12.0 - 15.0 g/dL   HCT 32.3 (L) 36.0 - 46.0 %  MCV 79.8 (L) 80.0 - 100.0 fL   MCH 23.5 (L) 26.0 - 34.0 pg   MCHC 29.4 (L) 30.0 - 36.0 g/dL   RDW 17.0 (H) 11.5 - 15.5 %   Platelets 233 150 - 400 K/uL   nRBC 0.0 0.0 - 0.2 %   Neutrophils Relative % 88 %   Neutro Abs 16.4 (H) 1.7 - 7.7 K/uL   Lymphocytes Relative 7 %   Lymphs Abs 1.3 0.7 - 4.0 K/uL   Monocytes Relative 4 %   Monocytes Absolute 0.7 0.1 - 1.0 K/uL   Eosinophils Relative 0 %   Eosinophils Absolute 0.1 0.0 - 0.5 K/uL   Basophils Relative 0 %   Basophils Absolute 0.1 0.0 - 0.1 K/uL   Immature Granulocytes 1 %   Abs Immature Granulocytes 0.15 (H) 0.00 - 0.07 K/uL    Comment: Performed at [facility], [place] [address] [place], [place] [address]  Comprehensive metabolic panel     Status: Abnormal   Collection Time:  03/09/22  4:46 PM  Result Value Ref Range   Sodium 141 135 - 145 mmol/L   Potassium 3.9 3.5 - 5.1 mmol/L   Chloride 109 98 - 111 mmol/L   CO2 26 22 - 32 mmol/L   Glucose, Bld 113 (H) 70 - 99 mg/dL    Comment: Glucose reference range applies only to samples taken after fasting for at least 8 hours.   BUN 30 (H) 8 - 23 mg/dL   Creatinine, Ser 1.36 (H) 0.44 - 1.00 mg/dL   Calcium 11.7 (H) 8.9 - 10.3 mg/dL   Total Protein 7.2 6.5 - 8.1 g/dL   Albumin 3.6 3.5 - 5.0 g/dL   AST 15 15 - 41 U/L   ALT 16 0 - 44 U/L   Alkaline Phosphatase 78 38 - 126 U/L   Total Bilirubin 0.8 0.3 - 1.2 mg/dL   GFR, Estimated 41 (L) >60 mL/min    Comment: (NOTE) Calculated using the CKD-EPI Creatinine Equation (2021)    Anion gap 6 5 - 15    Comment: Performed at [facility], [place] [address], [place], [place] [address]  Lipase, blood     Status: None   Collection Time: 03/09/22  4:46 PM  Result Value Ref Range   Lipase 26 11 - 51 U/L    Comment: Performed at [facility], [place] [address], [place], [place] [address]  Type and screen [place]     Status: None   Collection Time: 03/09/22  4:46 PM  Result Value Ref Range   ABO/RH(D) O NEG    Antibody Screen NEG    Sample Expiration      03/12/2022,2359 Performed at [facility], [place] [address], [place], [place] [address]   Resp Panel by RT-PCR (Flu A&B, Covid) Anterior Nasal Swab     Status: None   Collection Time: 03/09/22  7:38 PM   Specimen: Anterior Nasal Swab  Result Value Ref Range   SARS Coronavirus 2 by RT PCR NEGATIVE NEGATIVE    Comment: (NOTE) SARS-CoV-2 target nucleic acids are NOT DETECTED.  The SARS-CoV-2 RNA is generally detectable in upper respiratory specimens during the acute phase of infection. The lowest concentration of SARS-CoV-2 viral copies this assay can detect is 138 copies/mL. A negative result does not preclude SARS-Cov-2 infection and should not  be used as the sole basis for treatment or other patient management decisions. A negative result may occur  with  improper specimen collection/handling, submission of specimen other than nasopharyngeal swab, presence of viral mutation(s) within the areas targeted by this assay, and inadequate number of viral copies(<138 copies/mL). A negative result must be combined with clinical observations, patient history, and epidemiological information. The expected result is Negative.  Fact Sheet for Patients:  EntrepreneurPulse.com.au  Fact Sheet for Healthcare Providers:  IncredibleEmployment.be  This test is no t yet approved or cleared by the [place] FDA and  has been authorized for detection and/or diagnosis of SARS-CoV-2 by FDA under an Emergency Use Authorization (EUA). This EUA will remain  in effect (meaning this test can be used) for the duration of the COVID-19 declaration under Section 564(b)(1) of the Act, 21 U.S.C.section 360bbb-3(b)(1), unless the authorization is terminated  or revoked sooner.       Influenza A by PCR NEGATIVE NEGATIVE   Influenza B by PCR NEGATIVE NEGATIVE    Comment: (NOTE) The Xpert Xpress SARS-CoV-2/FLU/RSV plus assay is intended as an aid in the diagnosis of influenza from Nasopharyngeal swab specimens and should not be used as a sole basis for treatment. Nasal washings and aspirates are unacceptable for Xpert Xpress SARS-CoV-2/FLU/RSV testing.  Fact Sheet for Patients: EntrepreneurPulse.com.au  Fact Sheet for Healthcare Providers: IncredibleEmployment.be  This test is not yet approved or cleared by the [place] FDA and has been authorized for detection and/or diagnosis of SARS-CoV-2 by FDA under an Emergency Use Authorization (EUA). This EUA will remain in effect (meaning this test can be used) for the duration of the COVID-19 declaration under Section 564(b)(1) of  the Act, 21 U.S.C. section 360bbb-3(b)(1), unless the authorization is terminated or revoked.  Performed at [facility], [place] [address] [place], [place] [address]   Urinalysis, Routine w reflex microscopic Urine, Clean Catch     Status: Abnormal   Collection Time: 03/09/22  7:54 PM  Result Value Ref Range   Color, Urine STRAW (A) YELLOW   APPearance CLEAR CLEAR   Specific Gravity, Urine 1.021 1.005 - 1.030   pH 7.0 5.0 - 8.0   Glucose, UA NEGATIVE NEGATIVE mg/dL   Hgb urine dipstick NEGATIVE NEGATIVE   Bilirubin Urine NEGATIVE NEGATIVE   Ketones, ur NEGATIVE NEGATIVE mg/dL   Protein, ur NEGATIVE NEGATIVE mg/dL   Nitrite POSITIVE (A) NEGATIVE   Leukocytes,Ua NEGATIVE NEGATIVE   RBC / HPF 0-5 0 - 5 RBC/hpf   WBC, UA 0-5 0 - 5 WBC/hpf   Bacteria, UA RARE (A) NONE SEEN   Squamous Epithelial / LPF 0-5 0 - 5    Comment: Performed at [facility], [place] [address] [place], [place] [address]  Comprehensive metabolic panel     Status: Abnormal   Collection Time: 03/10/22  5:28 AM  Result Value Ref Range   Sodium 142 135 - 145 mmol/L   Potassium 4.0 3.5 - 5.1 mmol/L   Chloride 113 (H) 98 - 111 mmol/L   CO2 25 22 - 32 mmol/L   Glucose, Bld 99 70 - 99 mg/dL    Comment: Glucose reference range applies only to samples taken after fasting for at least 8 hours.   BUN 22 8 - 23 mg/dL   Creatinine, Ser 1.23 (H) 0.44 - 1.00 mg/dL   Calcium 10.5 (H) 8.9 - 10.3 mg/dL   Total Protein 5.9 (L) 6.5 - 8.1 g/dL   Albumin 2.6 (L) 3.5 - 5.0 g/dL   AST 12 (L) 15 - 41 U/L   ALT 12 0 -  44 U/L   Alkaline Phosphatase 60 38 - 126 U/L   Total Bilirubin 0.5 0.3 - 1.2 mg/dL   GFR, Estimated 46 (L) >60 mL/min    Comment: (NOTE) Calculated using the CKD-EPI Creatinine Equation (2021)    Anion gap 4 (L) 5 - 15    Comment: Performed at [facility], [place] [address] [place], [place] [address]  CBC with Differential/Platelet     Status: Abnormal    Collection Time: 03/10/22  5:28 AM  Result Value Ref Range   WBC 13.6 (H) 4.0 - 10.5 K/uL   RBC 3.42 (L) 3.87 - 5.11 MIL/uL   Hemoglobin 8.2 (L) 12.0 - 15.0 g/dL   HCT 27.7 (L) 36.0 - 46.0 %   MCV 81.0 80.0 - 100.0 fL   MCH 24.0 (L) 26.0 - 34.0 pg   MCHC 29.6 (L) 30.0 - 36.0 g/dL   RDW 17.1 (H) 11.5 - 15.5 %   Platelets 190 150 - 400 K/uL   nRBC 0.0 0.0 - 0.2 %   Neutrophils Relative % 85 %   Neutro Abs 11.6 (H) 1.7 - 7.7 K/uL   Lymphocytes Relative 9 %   Lymphs Abs 1.3 0.7 - 4.0 K/uL   Monocytes Relative 4 %   Monocytes Absolute 0.6 0.1 - 1.0 K/uL   Eosinophils Relative 1 %   Eosinophils Absolute 0.1 0.0 - 0.5 K/uL   Basophils Relative 0 %   Basophils Absolute 0.0 0.0 - 0.1 K/uL   Immature Granulocytes 1 %   Abs Immature Granulocytes 0.10 (H) 0.00 - 0.07 K/uL    Comment: Performed at [facility], [place] [address] [place], [place] [address]    US Abdomen Limited RUQ (LIVER/GB)  Result Date: 03/09/2022 CLINICAL DATA:  Right upper quadrant abdominal pain. EXAM: ULTRASOUND ABDOMEN LIMITED RIGHT UPPER QUADRANT COMPARISON:  February 28, 2014 FINDINGS: Gallbladder: Echogenic sludge is seen within the gallbladder lumen. Shadowing, echogenic gallstones are also seen. The largest measures approximately 1.1 cm. There is no evidence of gallbladder wall thickening (2.0 mm). No sonographic Murphy sign noted by sonographer. Common bile duct: Diameter: 7.1 mm Liver: The liver is enlarged (19 cm in length). No focal lesion identified. Within normal limits in parenchymal echogenicity. Portal vein is patent on color Doppler imaging with normal direction of blood flow towards the liver. Other: None. IMPRESSION: 1. Cholelithiasis and gallbladder sludge without acute cholecystitis. 2. Hepatomegaly without focal liver lesions. Electronically Signed   By: Virgina Norfolk M.D.   On: 03/09/2022 20:23   CT ABDOMEN PELVIS W CONTRAST  Result Date: 03/09/2022 CLINICAL DATA:  Bowel  obstruction suspected, vomiting for 3 days, abdominal pain EXAM: CT ABDOMEN AND PELVIS WITH CONTRAST TECHNIQUE: Multidetector CT imaging of the abdomen and pelvis was performed using the standard protocol following bolus administration of intravenous contrast. RADIATION DOSE REDUCTION: This exam was performed according to the departmental dose-optimization program which includes automated exposure control, adjustment of the mA and/or kV according to patient size and/or use of iterative reconstruction technique. CONTRAST:  118m OMNIPAQUE IOHEXOL 300 MG/ML  SOLN COMPARISON:  None Available. FINDINGS: Lower chest: Heterogeneous and ground-glass airspace opacity throughout the lungs spaces, with underlying bandlike scarring and or atelectasis (series 6, image 5). Large, partially imaged hiatal hernia with intrathoracic position of the gastric body and fundus. Hepatobiliary: No solid liver abnormality is seen. Hepatic steatosis. Pneumobilia, presumably postprocedural. No gallstones, gallbladder wall thickening, or biliary dilatation. Pancreas: Unremarkable. No pancreatic ductal dilatation or surrounding inflammatory changes. Spleen:  Normal in size without significant abnormality. Adrenals/Urinary Tract: Adrenal glands are unremarkable. Kidneys are normal, without renal calculi, solid lesion, or hydronephrosis. Bladder is unremarkable. Stomach/Bowel: Stomach is within normal limits. Appendix is not clearly visualized and may be surgically absent. No evidence of bowel wall thickening, distention, or inflammatory changes. Sigmoid diverticulosis. Vascular/Lymphatic: Aortic atherosclerosis. No enlarged abdominal or pelvic lymph nodes. Reproductive: Status post hysterectomy. Other: No abdominal wall hernia or abnormality. No ascites. Musculoskeletal: No acute or significant osseous findings. IMPRESSION: 1. No acute CT findings of the abdomen or pelvis to explain abdominal pain nausea, or vomiting. No evidence of bowel  obstruction. 2. Heterogeneous and ground-glass airspace opacity throughout the lung bases, consistent with infection or aspiration. 3. Large, partially imaged hiatal hernia with intrathoracic position of the gastric body and fundus. 4. Hepatic steatosis. 5. Sigmoid diverticulosis without evidence of acute diverticulitis. Aortic Atherosclerosis (ICD10-I70.0). Electronically Signed   By: Delanna Ahmadi M.D.   On: 03/09/2022 19:04    Review of Systems  Constitutional:  Negative for chills and fever.  Respiratory:  Positive for cough.   Cardiovascular:  Negative for chest pain.  Gastrointestinal:  Positive for abdominal pain, nausea and vomiting.  Genitourinary:  Negative for dysuria.  All other systems reviewed and are negative.  Blood pressure 111/66, pulse 66, temperature 98.3 F (36.8 C), temperature source Oral, resp. rate 18, height '5\' 5"'$  (1.651 m), weight 91.4 kg, SpO2 97 %. Physical Exam Constitutional:      Appearance: She is well-developed. She is not ill-appearing or diaphoretic.  HENT:     Head: Normocephalic and atraumatic.  Eyes:     General: No scleral icterus.    Extraocular Movements: Extraocular movements intact.  Cardiovascular:     Rate and Rhythm: Normal rate and regular rhythm.  Pulmonary:     Effort: Pulmonary effort is normal. No respiratory distress.     Breath sounds: Normal breath sounds.  Abdominal:     Tenderness: There is abdominal tenderness in the right upper quadrant and epigastric area.     Comments: Her abdomen is soft.  There is tenderness and guarding in the epigastrium and slightly to the right upper quadrant.  There are no palpable masses.  There is no hepatomegaly.  The rest of her abdomen is soft and nontender.  Skin:    General: Skin is warm and dry.     Coloration: Skin is not jaundiced.  Neurological:     General: No focal deficit present.     Mental Status: She is alert.  Psychiatric:        Behavior: Behavior normal.      Assessment/Plan: Pneumobilia with gallstones  The cause of her pneumobilia is uncertain.  I do not believe she has cholangitis given the normal liver function tests and lack of bile duct dilation on ultrasound.  Most of the time, pneumobilia can be from a procedure like an ERCP or from a fistulous of the gallbladder.  She has had no previous abdominal procedures.  There is nothing to suggest a fistula on her CT scan and other than gallstones, her ultrasound was fairly unremarkable.  Her white blood count has improved today and her liver function test remain normal. At this point, we will order an MRCP to make sure there is no abnormality of the bile duct and to rule out other causes of her symptoms.  If this is unremarkable, we will consider a laparoscopic cholecystectomy versus a HIDA scan tomorrow.  She may have clear liquids after  the MRCP and then will be n.p.o. at midnight  Complex medical decision making  Coralie Keens MD 03/10/2022, 9:21 AM

## 2022-03-10 NOTE — Progress Notes (Signed)
Lynn Jenkins NWG:[id number] DOB: [birth year] DOA: 03/09/2022 PCP: Lawerance Cruel, MD   Subj: 76 year old Hispanic female PMHx Generalized anxiety disorderPrimary, Lupus ([place]), Hyperparathyroidism, Mixed hyperlipidemia, glaucoma,Vitamin D deficiency  Presents to the ER today with a 3-day history of nausea, vomiting, abdominal pain.  She states that she has episodes of nausea vomiting abdominal pain at least once or twice a week.  These episodes last 2 to 3 days at a time.  She uses Reglan for this.  She states that she has a disabled husband at home.  She has not had this evaluated because she knows that she probably needs her gallbladder taken out.  She has been avoiding this.  Today, she had vomiting in the bathroom.  Her husband saw her.  He told the patient she has to go to the hospital get this evaluated.  She is very concerned about her husband at home.  She states that he is disabled and uses a walker.  She does not have any family to help her.   In the ER, temp 98.1 heart rate 71 blood pressure 121/66.   Labs showed a white count of 18.7, hemoglobin 9.5, platelets 233   Lipase normal at 26   Sodium 141 potassium 3.9 BUN of 30 creatinine 1.36 calcium 11.7   CT scan abdomen pelvis demonstrated pneumobilia.  She has a large hiatal hernia.   Right upper quadrant ultrasound was performed.  I was present at the time the ultrasound tech was performing this.  Pneumobilia was seen.  She also has a large gallstone.  Awaiting final read on her ultrasound.   Obj: A/O x4, positive refractory nausea, positive migraine headache (believes treated with triptan at home), positive RUQ abdominal pain not as severe as during admission.   Objective: VITAL SIGNS: Temp: 98.2 F (36.8 C) (10/01 1313) Temp Source: Oral (10/01 1313) BP: 141/82 (10/01 1313) Pulse Rate: 69 (10/01 1313) SPO2; 93% FIO2: 2 L/min   Intake/Output Summary (Last 24 hours) at 03/10/2022 1645 Last data filed at 03/10/2022  1627 Gross per 24 hour  Intake 1410.04 ml  Output --  Net 1410.04 ml     Exam: General: A/O x4, No acute respiratory distress Lungs: Clear to auscultation bilaterally without wheezes or crackles Cardiovascular: Regular rate and rhythm without murmur gallop or rub normal S1 and S2 Abdomen: Positive RUQ tenderness to palpation, nondistended, soft, bowel sounds positive, no rebound, no ascites, no appreciable mass Extremities: No significant cyanosis, clubbing, or edema bilateral lower extremities Skin: Negative rashes, lesions, ulcers Psychiatric:  Negative depression, negative anxiety, negative fatigue, negative mania  Central nervous system:  Cranial nerves II through XII intact, tongue/uvula midline, all extremities muscle strength 5/5, sensation intact throughout, negative dysarthria, negative expressive aphasia, negative receptive aphasia.   Mobility Assessment (last 72 hours)     Mobility Assessment     Row Name 03/10/22 0900 03/09/22 2127         Does patient have an order for bedrest or is patient medically unstable No - Continue assessment No - Continue assessment      What is the highest level of mobility based on the progressive mobility assessment? Level 5 (Walks with assist in room/hall) - Balance while stepping forward/back and can walk in room with assist - Complete Level 5 (Walks with assist in room/hall) - Balance while stepping forward/back and can walk in room with assist - Complete                 DVT prophylaxis:  Code Status: Full Family Communication:  Status is: Inpatient    Dispo: The patient is from: Home              Anticipated d/c is to: Home              Anticipated d/c date is: > 3 days              Patient currently is not medically stable to d/c.    Procedures/Significant Events:    Consultants:  Surgery   Cultures   Antimicrobials: Anti-infectives (From admission, onward)    Start     Dose/Rate Route Frequency Ordered  Stop   03/10/22 1000  ceFEPIme (MAXIPIME) 2 g in sodium chloride 0.9 % 100 mL IVPB        2 g 200 mL/hr over 30 Minutes Intravenous Every 12 hours 03/09/22 2019     03/09/22 2000  ceFEPIme (MAXIPIME) 2 g in sodium chloride 0.9 % 100 mL IVPB        2 g 200 mL/hr over 30 Minutes Intravenous  Once 03/09/22 1950 03/09/22 2306   03/09/22 1945  metroNIDAZOLE (FLAGYL) IVPB 500 mg        500 mg 100 mL/hr over 60 Minutes Intravenous Every 12 hours 03/09/22 1936          A/P Acute cholecystitis Observation med/surg bed. Continue IV cefepime/flagyl. Secure chat left with general surgery for consult in AM for acute cholecystitis.  Concern for acute cholangitis due to pneumobilia.  Repeat CBC, CMP in the morning. -10/1 per Dr. Nedra Hai general surgery,MRCP to make sure there is no abnormality of the bile duct and to rule out other causes of her symptoms.  If this is unremarkable, we will consider a laparoscopic cholecystectomy versus a HIDA scan tomorrow.  She may have clear liquids after the MRCP and then will be n.p.o. at midnight   Hyperparathyroidism, primary ([place]) Continue with IVF  AKI?  (Baseline CR 0.94, 1-year ago) -Renal ultrasound pending  Anemia - Anemia panel pending -Occult blood   Glaucoma Continue eye drops  Obesity (BMI 33.53 kg/m.) -  Refractory Nausea - 10/1 Thorazine IV 25 mg x 1  Hypothyroidism - Synthroid 88 mcg daily  Migraine - Imitrex subcu 6 mg x 1      Care during the described time interval was provided by me .  I have reviewed this patient's available data, including medical history, events of note, physical examination, and all test results as part of my evaluation.

## 2022-03-11 ENCOUNTER — Inpatient Hospital Stay (HOSPITAL_COMMUNITY): Payer: Medicare Other | Admitting: Certified Registered Nurse Anesthetist

## 2022-03-11 ENCOUNTER — Other Ambulatory Visit: Payer: Self-pay

## 2022-03-11 ENCOUNTER — Encounter (HOSPITAL_COMMUNITY)
Admission: EM | Disposition: A | Discharge status: Home / Self Care | Payer: Self-pay | Source: Home / Self Care | Attending: Internal Medicine

## 2022-03-11 ENCOUNTER — Encounter (HOSPITAL_COMMUNITY): Payer: Self-pay | Admitting: Internal Medicine

## 2022-03-11 DIAGNOSIS — E038 Other specified hypothyroidism: Secondary | ICD-10-CM

## 2022-03-11 DIAGNOSIS — K838 Other specified diseases of biliary tract: Secondary | ICD-10-CM | POA: Diagnosis not present

## 2022-03-11 DIAGNOSIS — K810 Acute cholecystitis: Secondary | ICD-10-CM | POA: Diagnosis not present

## 2022-03-11 DIAGNOSIS — D649 Anemia, unspecified: Secondary | ICD-10-CM | POA: Diagnosis not present

## 2022-03-11 DIAGNOSIS — K819 Cholecystitis, unspecified: Secondary | ICD-10-CM | POA: Diagnosis not present

## 2022-03-11 DIAGNOSIS — E210 Primary hyperparathyroidism: Secondary | ICD-10-CM | POA: Diagnosis not present

## 2022-03-11 DIAGNOSIS — K8010 Calculus of gallbladder with chronic cholecystitis without obstruction: Secondary | ICD-10-CM | POA: Diagnosis not present

## 2022-03-11 HISTORY — PX: CHOLECYSTECTOMY: SHX55

## 2022-03-11 LAB — CBC WITH DIFFERENTIAL/PLATELET
Abs Immature Granulocytes: 0.04 10*3/uL (ref 0.00–0.07)
Basophils Absolute: 0 10*3/uL (ref 0.0–0.1)
Basophils Relative: 0 %
Eosinophils Absolute: 0.1 10*3/uL (ref 0.0–0.5)
Eosinophils Relative: 1 %
HCT: 30.2 % — ABNORMAL LOW (ref 36.0–46.0)
Hemoglobin: 8.6 g/dL — ABNORMAL LOW (ref 12.0–15.0)
Immature Granulocytes: 0 %
Lymphocytes Relative: 13 %
Lymphs Abs: 1.2 10*3/uL (ref 0.7–4.0)
MCH: 23.5 pg — ABNORMAL LOW (ref 26.0–34.0)
MCHC: 28.5 g/dL — ABNORMAL LOW (ref 30.0–36.0)
MCV: 82.5 fL (ref 80.0–100.0)
Monocytes Absolute: 0.4 10*3/uL (ref 0.1–1.0)
Monocytes Relative: 4 %
Neutro Abs: 7.5 10*3/uL (ref 1.7–7.7)
Neutrophils Relative %: 82 %
Platelets: 196 10*3/uL (ref 150–400)
RBC: 3.66 MIL/uL — ABNORMAL LOW (ref 3.87–5.11)
RDW: 17.1 % — ABNORMAL HIGH (ref 11.5–15.5)
WBC: 9.3 10*3/uL (ref 4.0–10.5)
nRBC: 0 % (ref 0.0–0.2)

## 2022-03-11 LAB — COMPREHENSIVE METABOLIC PANEL
ALT: 12 U/L (ref 0–44)
AST: 12 U/L — ABNORMAL LOW (ref 15–41)
Albumin: 2.9 g/dL — ABNORMAL LOW (ref 3.5–5.0)
Alkaline Phosphatase: 68 U/L (ref 38–126)
Anion gap: 2 — ABNORMAL LOW (ref 5–15)
BUN: 16 mg/dL (ref 8–23)
CO2: 26 mmol/L (ref 22–32)
Calcium: 10.8 mg/dL — ABNORMAL HIGH (ref 8.9–10.3)
Chloride: 113 mmol/L — ABNORMAL HIGH (ref 98–111)
Creatinine, Ser: 1.04 mg/dL — ABNORMAL HIGH (ref 0.44–1.00)
GFR, Estimated: 56 mL/min — ABNORMAL LOW (ref >60)
Glucose, Bld: 102 mg/dL — ABNORMAL HIGH (ref 70–99)
Potassium: 4 mmol/L (ref 3.5–5.1)
Sodium: 141 mmol/L (ref 135–145)
Total Bilirubin: 0.7 mg/dL (ref 0.3–1.2)
Total Protein: 6.3 g/dL — ABNORMAL LOW (ref 6.5–8.1)

## 2022-03-11 LAB — IRON AND TIBC
Iron: 16 ug/dL — ABNORMAL LOW (ref 28–170)
Saturation Ratios: 5 % — ABNORMAL LOW (ref 10.4–31.8)
TIBC: 337 ug/dL (ref 250–450)
UIBC: 321 ug/dL

## 2022-03-11 LAB — RETICULOCYTES
Immature Retic Fract: 29 % — ABNORMAL HIGH (ref 2.3–15.9)
RBC.: 3.67 MIL/uL — ABNORMAL LOW (ref 3.87–5.11)
Retic Count, Absolute: 45.9 10*3/uL (ref 19.0–186.0)
Retic Ct Pct: 1.3 % (ref 0.4–3.1)

## 2022-03-11 LAB — PHOSPHORUS: Phosphorus: 2.5 mg/dL (ref 2.5–4.6)

## 2022-03-11 LAB — FERRITIN: Ferritin: 51 ng/mL (ref 11–307)

## 2022-03-11 LAB — VITAMIN B12: Vitamin B-12: 402 pg/mL (ref 180–914)

## 2022-03-11 LAB — FOLATE: Folate: 13.1 ng/mL (ref >5.9)

## 2022-03-11 LAB — MAGNESIUM: Magnesium: 1.9 mg/dL (ref 1.7–2.4)

## 2022-03-11 SURGERY — LAPAROSCOPIC CHOLECYSTECTOMY WITH INTRAOPERATIVE CHOLANGIOGRAM
Anesthesia: General | Site: Abdomen

## 2022-03-11 MED ORDER — BUPIVACAINE LIPOSOME 1.3 % IJ SUSP
INTRAMUSCULAR | Status: AC
  Filled 2022-03-11: qty 20

## 2022-03-11 MED ORDER — ACETAMINOPHEN 500 MG PO TABS: 1000.0000 mg | ORAL_TABLET | ORAL | Status: DC

## 2022-03-11 MED ORDER — FENTANYL CITRATE (PF) 250 MCG/5ML IJ SOLN
INTRAMUSCULAR | Status: AC
  Filled 2022-03-11: qty 5

## 2022-03-11 MED ORDER — DEXAMETHASONE SODIUM PHOSPHATE 10 MG/ML IJ SOLN
INTRAMUSCULAR | Status: AC
  Filled 2022-03-11: qty 1

## 2022-03-11 MED ORDER — SUGAMMADEX SODIUM 200 MG/2ML IV SOLN
INTRAVENOUS | Status: DC | PRN
  Administered 2022-03-11: 200 mg via INTRAVENOUS

## 2022-03-11 MED ORDER — BUPIVACAINE LIPOSOME 1.3 % IJ SUSP
INTRAMUSCULAR | Status: DC | PRN
  Administered 2022-03-11: 20 mL

## 2022-03-11 MED ORDER — STERILE WATER FOR INJECTION IJ SOLN
INTRAMUSCULAR | Status: DC | PRN
  Administered 2022-03-11: 3 mL via INTRAVENOUS

## 2022-03-11 MED ORDER — LACTATED RINGERS IV SOLN: INTRAVENOUS | Status: DC

## 2022-03-11 MED ORDER — ONDANSETRON HCL 4 MG/2ML IJ SOLN: 4.0000 mg | Freq: Once | INTRAMUSCULAR | Status: DC | PRN

## 2022-03-11 MED ORDER — SUCCINYLCHOLINE CHLORIDE 200 MG/10ML IV SOSY
PREFILLED_SYRINGE | INTRAVENOUS | Status: DC | PRN
  Administered 2022-03-11: 120 mg via INTRAVENOUS

## 2022-03-11 MED ORDER — ONDANSETRON HCL 4 MG/2ML IJ SOLN
INTRAMUSCULAR | Status: DC | PRN
  Administered 2022-03-11: 4 mg via INTRAVENOUS

## 2022-03-11 MED ORDER — PROPOFOL 500 MG/50ML IV EMUL
INTRAVENOUS | Status: AC
  Filled 2022-03-11: qty 50

## 2022-03-11 MED ORDER — PROPOFOL 500 MG/50ML IV EMUL
INTRAVENOUS | Status: DC | PRN
  Administered 2022-03-11: 30 ug/kg/min via INTRAVENOUS

## 2022-03-11 MED ORDER — ROCURONIUM BROMIDE 10 MG/ML (PF) SYRINGE
PREFILLED_SYRINGE | INTRAVENOUS | Status: DC | PRN
  Administered 2022-03-11: 50 mg via INTRAVENOUS

## 2022-03-11 MED ORDER — DEXAMETHASONE SODIUM PHOSPHATE 10 MG/ML IJ SOLN
INTRAMUSCULAR | Status: DC | PRN
  Administered 2022-03-11: 8 mg via INTRAVENOUS

## 2022-03-11 MED ORDER — ORAL CARE MOUTH RINSE: 15.0000 mL | Freq: Once | OROMUCOSAL | Status: AC

## 2022-03-11 MED ORDER — BUPIVACAINE-EPINEPHRINE (PF) 0.25% -1:200000 IJ SOLN
INTRAMUSCULAR | Status: AC
  Filled 2022-03-11: qty 30

## 2022-03-11 MED ORDER — 0.9 % SODIUM CHLORIDE (POUR BTL) OPTIME
TOPICAL | Status: DC | PRN
  Administered 2022-03-11: 1000 mL

## 2022-03-11 MED ORDER — ONDANSETRON HCL 4 MG/2ML IJ SOLN
INTRAMUSCULAR | Status: AC
  Filled 2022-03-11: qty 2

## 2022-03-11 MED ORDER — FENTANYL CITRATE PF 50 MCG/ML IJ SOSY: 25.0000 ug | PREFILLED_SYRINGE | INTRAMUSCULAR | Status: DC | PRN

## 2022-03-11 MED ORDER — LIDOCAINE 2% (20 MG/ML) 5 ML SYRINGE
INTRAMUSCULAR | Status: DC | PRN
  Administered 2022-03-11: 60 mg via INTRAVENOUS

## 2022-03-11 MED ORDER — CHLORHEXIDINE GLUCONATE 0.12 % MT SOLN
15.0000 mL | Freq: Once | OROMUCOSAL | Status: AC
  Administered 2022-03-11: 15 mL via OROMUCOSAL

## 2022-03-11 MED ORDER — ROCURONIUM BROMIDE 10 MG/ML (PF) SYRINGE
PREFILLED_SYRINGE | INTRAVENOUS | Status: AC
  Filled 2022-03-11: qty 10

## 2022-03-11 MED ORDER — PROPOFOL 10 MG/ML IV BOLUS
INTRAVENOUS | Status: DC | PRN
  Administered 2022-03-11: 150 mg via INTRAVENOUS

## 2022-03-11 MED ORDER — OXYCODONE HCL 5 MG PO TABS
5.0000 mg | ORAL_TABLET | Freq: Once | ORAL | Status: AC | PRN
  Administered 2022-03-11: 5 mg via ORAL

## 2022-03-11 MED ORDER — OXYCODONE HCL 5 MG PO TABS
ORAL_TABLET | ORAL | Status: AC
  Filled 2022-03-11: qty 1

## 2022-03-11 MED ORDER — OXYCODONE HCL 5 MG/5ML PO SOLN: 5.0000 mg | Freq: Once | ORAL | Status: AC | PRN

## 2022-03-11 MED ORDER — DEXTROSE-NACL 5-0.9 % IV SOLN: INTRAVENOUS | Status: DC

## 2022-03-11 MED ORDER — LIDOCAINE HCL (PF) 2 % IJ SOLN
INTRAMUSCULAR | Status: AC
  Filled 2022-03-11: qty 5

## 2022-03-11 MED ORDER — PHENYLEPHRINE 80 MCG/ML (10ML) SYRINGE FOR IV PUSH (FOR BLOOD PRESSURE SUPPORT)
PREFILLED_SYRINGE | INTRAVENOUS | Status: DC | PRN
  Administered 2022-03-11: 160 ug via INTRAVENOUS

## 2022-03-11 MED ORDER — INDOCYANINE GREEN 25 MG IV SOLR
7.5000 mg | Freq: Once | INTRAVENOUS | Status: AC
  Administered 2022-03-11: 7.5 mg via INTRAVENOUS

## 2022-03-11 MED ORDER — FENTANYL CITRATE (PF) 100 MCG/2ML IJ SOLN
INTRAMUSCULAR | Status: DC | PRN
  Administered 2022-03-11 (×2): 50 ug via INTRAVENOUS

## 2022-03-11 MED ORDER — PROPOFOL 10 MG/ML IV BOLUS
INTRAVENOUS | Status: AC
  Filled 2022-03-11: qty 20

## 2022-03-11 MED ORDER — PHENYLEPHRINE HCL-NACL 20-0.9 MG/250ML-% IV SOLN
INTRAVENOUS | Status: DC | PRN
  Administered 2022-03-11: 40 ug/min via INTRAVENOUS

## 2022-03-11 MED ORDER — BUPIVACAINE-EPINEPHRINE 0.25% -1:200000 IJ SOLN
INTRAMUSCULAR | Status: DC | PRN
  Administered 2022-03-11: 30 mL

## 2022-03-11 MED ORDER — LACTATED RINGERS IR SOLN
Status: DC | PRN
  Administered 2022-03-11: 1000 mL

## 2022-03-11 SURGICAL SUPPLY — 49 items
ADH SKN CLS APL DERMABOND .7 (GAUZE/BANDAGES/DRESSINGS) ×1
ADH SKN CLS LQ APL DERMABOND (GAUZE/BANDAGES/DRESSINGS) ×1
APL PRP STRL LF DISP 70% ISPRP (MISCELLANEOUS) ×1
APL SKNCLS STERI-STRIP NONHPOA (GAUZE/BANDAGES/DRESSINGS)
APPLIER CLIP ROT 10 11.4 M/L (STAPLE) ×1
APR CLP MED LRG 11.4X10 (STAPLE) ×1
BAG COUNTER SPONGE SURGICOUNT (BAG) ×2 IMPLANT
BAG SPEC RTRVL LRG 6X4 10 (ENDOMECHANICALS) ×1
BAG SPNG CNTER NS LX DISP (BAG) ×1
BENZOIN TINCTURE PRP APPL 2/3 (GAUZE/BANDAGES/DRESSINGS) IMPLANT
BNDG ADH 1X3 SHEER STRL LF (GAUZE/BANDAGES/DRESSINGS) IMPLANT
BNDG ADH THN 3X1 STRL LF (GAUZE/BANDAGES/DRESSINGS)
CABLE HIGH FREQUENCY MONO STRZ (ELECTRODE) ×2 IMPLANT
CHLORAPREP W/TINT 26 (MISCELLANEOUS) ×2 IMPLANT
CLIP APPLIE ROT 10 11.4 M/L (STAPLE) ×2 IMPLANT
COVER MAYO STAND XLG (MISCELLANEOUS) IMPLANT
COVER SURGICAL LIGHT HANDLE (MISCELLANEOUS) ×2 IMPLANT
DERMABOND ADVANCED .7 DNX12 (GAUZE/BANDAGES/DRESSINGS) IMPLANT
DERMABOND ADVANCED .7 DNX6 (GAUZE/BANDAGES/DRESSINGS) IMPLANT
DRAPE C-ARM 42X120 X-RAY (DRAPES) IMPLANT
ELECT REM PT RETURN 15FT ADLT (MISCELLANEOUS) ×2 IMPLANT
GLOVE BIO SURGEON STRL SZ 6 (GLOVE) ×2 IMPLANT
GLOVE INDICATOR 6.5 STRL GRN (GLOVE) ×2 IMPLANT
GLOVE SS BIOGEL STRL SZ 6 (GLOVE) ×2 IMPLANT
GOWN STRL REUS W/ TWL LRG LVL3 (GOWN DISPOSABLE) ×2 IMPLANT
GOWN STRL REUS W/ TWL XL LVL3 (GOWN DISPOSABLE) IMPLANT
GOWN STRL REUS W/TWL LRG LVL3 (GOWN DISPOSABLE) ×1
GOWN STRL REUS W/TWL XL LVL3 (GOWN DISPOSABLE)
GRASPER SUT TROCAR 14GX15 (MISCELLANEOUS) IMPLANT
HEMOSTAT SNOW SURGICEL 2X4 (HEMOSTASIS) IMPLANT
IRRIG SUCT STRYKERFLOW 2 WTIP (MISCELLANEOUS) ×1
IRRIGATION SUCT STRKRFLW 2 WTP (MISCELLANEOUS) ×2 IMPLANT
KIT BASIN OR (CUSTOM PROCEDURE TRAY) ×2 IMPLANT
KIT TURNOVER KIT A (KITS) IMPLANT
NDL INSUFFLATION 14GA 120MM (NEEDLE) ×2 IMPLANT
NEEDLE INSUFFLATION 14GA 120MM (NEEDLE) ×1 IMPLANT
POUCH SPECIMEN RETRIEVAL 10MM (ENDOMECHANICALS) ×2 IMPLANT
SCISSORS LAP 5X35 DISP (ENDOMECHANICALS) ×2 IMPLANT
SET CHOLANGIOGRAPH MIX (MISCELLANEOUS) IMPLANT
SET TUBE SMOKE EVAC HIGH FLOW (TUBING) ×2 IMPLANT
SLEEVE Z-THREAD 5X100MM (TROCAR) ×4 IMPLANT
SPIKE FLUID TRANSFER (MISCELLANEOUS) ×2 IMPLANT
STRIP CLOSURE SKIN 1/2X4 (GAUZE/BANDAGES/DRESSINGS) IMPLANT
SUT MNCRL AB 4-0 PS2 18 (SUTURE) ×2 IMPLANT
TOWEL OR 17X26 10 PK STRL BLUE (TOWEL DISPOSABLE) ×2 IMPLANT
TOWEL OR NON WOVEN STRL DISP B (DISPOSABLE) IMPLANT
TRAY LAPAROSCOPIC (CUSTOM PROCEDURE TRAY) ×2 IMPLANT
TROCAR ADV FIXATION 12X100MM (TROCAR) ×2 IMPLANT
TROCAR Z-THREAD OPTICAL 5X100M (TROCAR) ×2 IMPLANT

## 2022-03-11 NOTE — Transfer of Care (Signed)
Immediate Anesthesia Transfer of Care Note  Patient: Lynn Jenkins  Procedure(s) Performed: LAPAROSCOPIC CHOLECYSTECTOMY WITH icg dye (Abdomen)  Patient Location: PACU  Anesthesia Type:General  Level of Consciousness: drowsy and patient cooperative  Airway & Oxygen Therapy: Patient Spontanous Breathing and Patient connected to face mask oxygen  Post-op Assessment: Report given to RN and Post -op Vital signs reviewed and stable  Post vital signs: Reviewed and stable  Last Vitals:  Vitals Value Taken Time  BP 135/71 03/11/22 1207  Temp    Pulse 86 03/11/22 1210  Resp 28 03/11/22 1210  SpO2 92 % 03/11/22 1210  Vitals shown include unvalidated device data.  Last Pain:  Vitals:   03/11/22 1009  TempSrc:   PainSc: 0-No pain      Patients Stated Pain Goal: 2 (44/58/48 3507)  Complications: No notable events documented.

## 2022-03-11 NOTE — Anesthesia Preprocedure Evaluation (Addendum)
Anesthesia Evaluation  Patient identified by MRN, date of birth, ID band Patient awake    Reviewed: Allergy & Precautions, NPO status , Patient's Chart, lab work & pertinent test results  History of Anesthesia Complications Negative for: history of anesthetic complications  Airway Mallampati: II  TM Distance: >3 FB Neck ROM: Full    Dental  (+) Dental Advisory Given, Teeth Intact   Pulmonary neg pulmonary ROS,    Pulmonary exam normal        Cardiovascular negative cardio ROS Normal cardiovascular exam     Neuro/Psych  Headaches, PSYCHIATRIC DISORDERS Anxiety    GI/Hepatic Neg liver ROS, hiatal hernia, GERD  Medicated and Controlled, Cholecystitis    Endo/Other  Hypothyroidism  Obesity Primary hyperparathyroidism   Renal/GU negative Renal ROS     Musculoskeletal  (+) Arthritis ,   Abdominal   Peds  Hematology  (+) Blood dyscrasia, anemia ,   Anesthesia Other Findings Lupus   Reproductive/Obstetrics                            Anesthesia Physical Anesthesia Plan  ASA: 2  Anesthesia Plan: General   Post-op Pain Management: Tylenol PO (pre-op)*   Induction: Intravenous, Rapid sequence and Cricoid pressure planned  PONV Risk Score and Plan: 3 and Treatment may vary due to age or medical condition, Ondansetron, Dexamethasone and Propofol infusion  Airway Management Planned: Oral ETT  Additional Equipment: None  Intra-op Plan:   Post-operative Plan: Extubation in OR  Informed Consent: I have reviewed the patients History and Physical, chart, labs and discussed the procedure including the risks, benefits and alternatives for the proposed anesthesia with the patient or authorized representative who has indicated his/her understanding and acceptance.     Dental advisory given  Plan Discussed with: CRNA and Anesthesiologist  Anesthesia Plan Comments:        Anesthesia  Quick Evaluation

## 2022-03-11 NOTE — Anesthesia Procedure Notes (Signed)
Procedure Name: Intubation Date/Time: 03/11/2022 10:30 AM  Performed by: Montel Clock, CRNAPre-anesthesia Checklist: Patient identified, Emergency Drugs available, Suction available, Patient being monitored and Timeout performed Patient Re-evaluated:Patient Re-evaluated prior to induction Oxygen Delivery Method: Circle system utilized Preoxygenation: Pre-oxygenation with 100% oxygen Induction Type: IV induction, Rapid sequence and Cricoid Pressure applied Laryngoscope Size: Mac and 3 Grade View: Grade I Tube type: Oral Tube size: 7.0 mm Number of attempts: 1 Airway Equipment and Method: Stylet Placement Confirmation: ETT inserted through vocal cords under direct vision, positive ETCO2 and breath sounds checked- equal and bilateral Secured at: 21 cm Tube secured with: Tape Dental Injury: Teeth and Oropharynx as per pre-operative assessment

## 2022-03-11 NOTE — Op Note (Signed)
Operative Note  Lynn Jenkins 76 y.o. female [id number]  03/11/2022  Surgeon: Clovis Riley MD FACS I performed the key and critical portions of this procedure and immediately available throughout the entire procedure, as documented in my operative note.   Assistant: Lossie Faes MD ([id number])  Procedure performed: Laparoscopic Cholecystectomy  Procedure classification: urgent  Preop diagnosis: Cholecystitis Post-op diagnosis/intraop findings: same  Specimens: gallbladder  Retained items: none  EBL: minimal  Complications: none  Description of procedure: After obtaining informed consent the patient was brought to the operating room. Antibiotics were administered. SCD's were applied. General endotracheal anesthesia was initiated and a formal time-out was performed. The abdomen was prepped and draped in the usual sterile fashion and the abdomen was entered using  a left subcostal Veress needle  after instilling the site with local. Insufflation to 71mHg was obtained, 561mtrocar and camera inserted with optical entry in the right upper quadrant, and gross inspection revealed no evidence of injury from our entry or other intraabdominal abnormalities. Two 32m30mrocars were introduced in the infraumbilical and right anterior axillary lines under direct visualization and following infiltration with local. A 7m10mocar was placed in the epigastrium. The gallbladder was tensely distended, edematous.  This was decompressed with the Nezhat in order to allow to be grasped.  Gallbladder fundus was retracted cephalad and omental adhesions to the body of the gallbladder were carefully taken down bluntly and with cautery.  As we approached the infundibulum, the duodenum was noted to be tethered up to the gallbladder with a band of adhesions.  This was very carefully taken down with cold scissors until the duodenum was able to fall away.  The duodenal wall was inspected multiple times throughout the case and  there did not appear to be any violation of the serosa or active communication with the gallbladder or evidence of fistula.  The infundibulum was retracted laterally. A combination of hook electrocautery and blunt dissection was utilized to clear the peritoneum from the neck and cystic duct, circumferentially isolating the cystic artery and cystic duct and lifting the gallbladder from the cystic plate. The critical view of safety was achieved with the cystic artery, cystic duct, and liver bed visualized between them with no other structures.  This was concordant with the view on firefly.  The artery was clipped with a single clip proximally and distally and divided as was the cystic duct with three clips on the proximal end. The gallbladder was dissected from the liver plate using electrocautery. Once freed the gallbladder was placed in an endocatch bag and removed intact through the epigastric trocar site. Some bile had been spilled from the gallbladder (from the puncture site where the Nezhat had decompressed it ) during its dissection from the liver bed. This was aspirated and the right upper quadrant was irrigated copiously until the effluent was clear. Hemostasis was once again confirmed, and reinspection of the abdomen revealed no injuries. The clips were well opposed without any bile leak from the duct or the liver bed on primary view nor on firefly.  Duodenum was once again closely inspected and confirmed to be free of any evidence of fistula.  The 7mm57mcar site in the epigastrium was closed with a 0 vicryl in the fascia under direct visualization using a PMI device. The abdomen was desufflated and all trocars removed. The skin incisions were closed with subcuticular 4-0 monocryl and Dermabond. The patient was awakened, extubated and transported to the recovery room in stable condition.  All counts were correct at the completion of the case.

## 2022-03-11 NOTE — Progress Notes (Signed)
[facility] Progress Note     Subjective: CC-  Continues to have RUQ/epigastric abdominal pain. Nausea is improved since admission. States that these symptoms are random, not necessarily worse after PO intake. She does also report h/o reflux that she takes nexium for. Reflux is not particularly worse with her current symptoms. MRCP yesterday showed cholelithiasis with mild distention of the gallbladder and gallbladder wall thickening, no biliary ductal dilatation, suspicious for acute cholecystitis, no evidence of pneumobilia.  Cough also improved since admission. Denies SOB. Comfortable on 2L [place].  Objective: Vital signs in last 24 hours: Temp:  [97.6 F (36.4 C)-98.4 F (36.9 C)] 98.4 F (36.9 C) (10/02 0504) Pulse Rate:  [65-69] 69 (10/02 0504) Resp:  [16-18] 18 (10/02 0504) BP: (111-141)/(66-82) 125/70 (10/02 0504) SpO2:  [93 %-98 %] 93 % (10/02 0504) Weight:  [87.8 kg] 87.8 kg (10/02 0504) Last BM Date : 03/07/22  Intake/Output from previous day: 10/01 0701 - 10/02 0700 In: 1150.8 [P.O.:240; I.V.:381; IV Piggyback:409.9] Out: 600 [Urine:600] Intake/Output this shift: No intake/output data recorded.  PE: Gen:  Alert, NAD, pleasant Card:  RRR Pulm:  CTAB, no W/R/R, rate and effort normal on 2L [place] Abd: Soft, ND, TTP RUQ and epigastric region without rebound or guarding  Lab Results:  Recent Labs    03/10/22 0528 03/11/22 0331  WBC 13.6* 9.3  HGB 8.2* 8.6*  HCT 27.7* 30.2*  PLT 190 196   BMET Recent Labs    03/10/22 0528 03/11/22 0331  NA 142 141  K 4.0 4.0  CL 113* 113*  CO2 25 26  GLUCOSE 99 102*  BUN 22 16  CREATININE 1.23* 1.04*  CALCIUM 10.5* 10.8*   PT/INR No results for input(s): "LABPROT", "INR" in the last 72 hours. CMP     Component Value Date/Time   NA 141 03/11/2022 0331   K 4.0 03/11/2022 0331   CL 113 (H) 03/11/2022 0331   CO2 26 03/11/2022 0331   GLUCOSE 102 (H) 03/11/2022 0331   BUN 16 03/11/2022 0331   CREATININE  1.04 (H) 03/11/2022 0331   CREATININE 1.13 (H) 08/20/2019 1226   CALCIUM 10.8 (H) 03/11/2022 0331   PROT 6.3 (L) 03/11/2022 0331   ALBUMIN 2.9 (L) 03/11/2022 0331   AST 12 (L) 03/11/2022 0331   AST 13 (L) 08/20/2019 1226   ALT 12 03/11/2022 0331   ALT 13 08/20/2019 1226   ALKPHOS 68 03/11/2022 0331   BILITOT 0.7 03/11/2022 0331   BILITOT 0.4 08/20/2019 1226   GFRNONAA 56 (L) 03/11/2022 0331   GFRNONAA 49 (L) 08/20/2019 1226   GFRAA >60 11/14/2019 1455   GFRAA 56 (L) 08/20/2019 1226   Lipase     Component Value Date/Time   LIPASE 26 03/09/2022 1646       Studies/Results: MR ABDOMEN MRCP W WO CONTAST  Result Date: 03/10/2022 CLINICAL DATA:  Gallstones, pneumobilia, no previous biliary procedures EXAM: MRI ABDOMEN WITHOUT AND WITH CONTRAST (INCLUDING MRCP) TECHNIQUE: Multiplanar multisequence MR imaging of the abdomen was performed both before and after the administration of intravenous contrast. Heavily T2-weighted images of the biliary and pancreatic ducts were obtained, and three-dimensional MRCP images were rendered by post processing. CONTRAST:  9 mL Vueway gadolinium contrast IV COMPARISON:  CT abdomen pelvis, 03/09/2022 FINDINGS: Lower chest: Heterogeneous airspace opacity in the bilateral lung bases, assessment of the lung parenchyma limited on MR. Cardiomegaly. Large hiatal hernia. Hepatobiliary: No solid liver abnormality is seen. Mildly distended gallbladder containing a single gallstone. Mild gallbladder wall  thickening (series 6, image 23, 20). No biliary ductal dilatation. Pancreas: Unremarkable. No pancreatic ductal dilatation or surrounding inflammatory changes. Spleen: Normal in size without significant abnormality. Adrenals/Urinary Tract: Adrenal glands are unremarkable. Simple, benign bilateral renal cortical cysts, for which no further follow-up or characterization is required. Kidneys are otherwise normal, without renal calculi, solid lesion, or hydronephrosis.  Stomach/Bowel: Stomach is within normal limits. No evidence of bowel wall thickening, distention, or inflammatory changes. Vascular/Lymphatic: No significant vascular findings are present. No enlarged abdominal lymph nodes. Other: No abdominal wall hernia or abnormality. No ascites. Musculoskeletal: No acute or significant osseous findings. IMPRESSION: 1. Cholelithiasis with mild distention of the gallbladder and gallbladder wall thickening. No biliary ductal dilatation. Findings are suspicious for acute cholecystitis. 2. No evident pneumobilia on today's MR examination, however MR is limited in sensitivity for pneumobilia. 3. Heterogeneous airspace disease in the bilateral lung bases, as seen by prior CT, assessment limited by MR. 4. Large hiatal hernia. Electronically Signed   By: Delanna Ahmadi M.D.   On: 03/10/2022 13:34   MR 3D Recon At Scanner  Result Date: 03/10/2022 CLINICAL DATA:  Gallstones, pneumobilia, no previous biliary procedures EXAM: MRI ABDOMEN WITHOUT AND WITH CONTRAST (INCLUDING MRCP) TECHNIQUE: Multiplanar multisequence MR imaging of the abdomen was performed both before and after the administration of intravenous contrast. Heavily T2-weighted images of the biliary and pancreatic ducts were obtained, and three-dimensional MRCP images were rendered by post processing. CONTRAST:  9 mL Vueway gadolinium contrast IV COMPARISON:  CT abdomen pelvis, 03/09/2022 FINDINGS: Lower chest: Heterogeneous airspace opacity in the bilateral lung bases, assessment of the lung parenchyma limited on MR. Cardiomegaly. Large hiatal hernia. Hepatobiliary: No solid liver abnormality is seen. Mildly distended gallbladder containing a single gallstone. Mild gallbladder wall thickening (series 6, image 23, 20). No biliary ductal dilatation. Pancreas: Unremarkable. No pancreatic ductal dilatation or surrounding inflammatory changes. Spleen: Normal in size without significant abnormality. Adrenals/Urinary Tract: Adrenal  glands are unremarkable. Simple, benign bilateral renal cortical cysts, for which no further follow-up or characterization is required. Kidneys are otherwise normal, without renal calculi, solid lesion, or hydronephrosis. Stomach/Bowel: Stomach is within normal limits. No evidence of bowel wall thickening, distention, or inflammatory changes. Vascular/Lymphatic: No significant vascular findings are present. No enlarged abdominal lymph nodes. Other: No abdominal wall hernia or abnormality. No ascites. Musculoskeletal: No acute or significant osseous findings. IMPRESSION: 1. Cholelithiasis with mild distention of the gallbladder and gallbladder wall thickening. No biliary ductal dilatation. Findings are suspicious for acute cholecystitis. 2. No evident pneumobilia on today's MR examination, however MR is limited in sensitivity for pneumobilia. 3. Heterogeneous airspace disease in the bilateral lung bases, as seen by prior CT, assessment limited by MR. 4. Large hiatal hernia. Electronically Signed   By: Delanna Ahmadi M.D.   On: 03/10/2022 13:34   US RENAL  Result Date: 03/10/2022 CLINICAL DATA:  Acute renal failure. EXAM: RENAL / URINARY TRACT ULTRASOUND COMPLETE COMPARISON:  CT, 03/09/2022. FINDINGS: Right Kidney: Renal measurements: 9.7 x 4.8 x 5.1 cm = volume: 123.4 mL. Normal parenchymal echogenicity. 1.1 cm cyst, anterior midpole. No follow-up indicated. No other masses, no stones and no hydronephrosis. Left Kidney: Renal measurements: 9.7 x 4.6 x 4.5 cm = volume: 106.4 mL. Normal parenchymal echogenicity. Small upper pole cyst, 1 cm size, not well-defined on the previous day's CT. No follow-up recommended. No other renal masses, no stones and no hydronephrosis. Bladder: Appears normal for degree of bladder distention. Other: None. IMPRESSION: 1. No acute findings.  No hydronephrosis.  2. Small, single cysts in each kidney. Electronically Signed   By: Lajean Manes M.D.   On: 03/10/2022 12:01   US Abdomen  Limited RUQ (LIVER/GB)  Result Date: 03/09/2022 CLINICAL DATA:  Right upper quadrant abdominal pain. EXAM: ULTRASOUND ABDOMEN LIMITED RIGHT UPPER QUADRANT COMPARISON:  February 28, 2014 FINDINGS: Gallbladder: Echogenic sludge is seen within the gallbladder lumen. Shadowing, echogenic gallstones are also seen. The largest measures approximately 1.1 cm. There is no evidence of gallbladder wall thickening (2.0 mm). No sonographic Murphy sign noted by sonographer. Common bile duct: Diameter: 7.1 mm Liver: The liver is enlarged (19 cm in length). No focal lesion identified. Within normal limits in parenchymal echogenicity. Portal vein is patent on color Doppler imaging with normal direction of blood flow towards the liver. Other: None. IMPRESSION: 1. Cholelithiasis and gallbladder sludge without acute cholecystitis. 2. Hepatomegaly without focal liver lesions. Electronically Signed   By: Virgina Norfolk M.D.   On: 03/09/2022 20:23   CT ABDOMEN PELVIS W CONTRAST  Result Date: 03/09/2022 CLINICAL DATA:  Bowel obstruction suspected, vomiting for 3 days, abdominal pain EXAM: CT ABDOMEN AND PELVIS WITH CONTRAST TECHNIQUE: Multidetector CT imaging of the abdomen and pelvis was performed using the standard protocol following bolus administration of intravenous contrast. RADIATION DOSE REDUCTION: This exam was performed according to the departmental dose-optimization program which includes automated exposure control, adjustment of the mA and/or kV according to patient size and/or use of iterative reconstruction technique. CONTRAST:  124m OMNIPAQUE IOHEXOL 300 MG/ML  SOLN COMPARISON:  None Available. FINDINGS: Lower chest: Heterogeneous and ground-glass airspace opacity throughout the lungs spaces, with underlying bandlike scarring and or atelectasis (series 6, image 5). Large, partially imaged hiatal hernia with intrathoracic position of the gastric body and fundus. Hepatobiliary: No solid liver abnormality is seen.  Hepatic steatosis. Pneumobilia, presumably postprocedural. No gallstones, gallbladder wall thickening, or biliary dilatation. Pancreas: Unremarkable. No pancreatic ductal dilatation or surrounding inflammatory changes. Spleen: Normal in size without significant abnormality. Adrenals/Urinary Tract: Adrenal glands are unremarkable. Kidneys are normal, without renal calculi, solid lesion, or hydronephrosis. Bladder is unremarkable. Stomach/Bowel: Stomach is within normal limits. Appendix is not clearly visualized and may be surgically absent. No evidence of bowel wall thickening, distention, or inflammatory changes. Sigmoid diverticulosis. Vascular/Lymphatic: Aortic atherosclerosis. No enlarged abdominal or pelvic lymph nodes. Reproductive: Status post hysterectomy. Other: No abdominal wall hernia or abnormality. No ascites. Musculoskeletal: No acute or significant osseous findings. IMPRESSION: 1. No acute CT findings of the abdomen or pelvis to explain abdominal pain nausea, or vomiting. No evidence of bowel obstruction. 2. Heterogeneous and ground-glass airspace opacity throughout the lung bases, consistent with infection or aspiration. 3. Large, partially imaged hiatal hernia with intrathoracic position of the gastric body and fundus. 4. Hepatic steatosis. 5. Sigmoid diverticulosis without evidence of acute diverticulitis. Aortic Atherosclerosis (ICD10-I70.0). Electronically Signed   By: ADelanna AhmadiM.D.   On: 03/09/2022 19:04    Anti-infectives: Anti-infectives (From admission, onward)    Start     Dose/Rate Route Frequency Ordered Stop   03/10/22 1000  ceFEPIme (MAXIPIME) 2 g in sodium chloride 0.9 % 100 mL IVPB        2 g 200 mL/hr over 30 Minutes Intravenous Every 12 hours 03/09/22 2019     03/09/22 2000  ceFEPIme (MAXIPIME) 2 g in sodium chloride 0.9 % 100 mL IVPB        2 g 200 mL/hr over 30 Minutes Intravenous  Once 03/09/22 1950 03/09/22 2306   03/09/22 1945  metroNIDAZOLE (  FLAGYL) IVPB 500 mg         500 mg 100 mL/hr over 60 Minutes Intravenous Every 12 hours 03/09/22 1936          Assessment/Plan RUQ/epigastric abdominal pain, n/v Pneumobilia with cholelithiasis ?Cholecystitis - MRCP yesterday showed cholelithiasis with mild distention of the gallbladder and gallbladder wall thickening, no biliary ductal dilatation, suspicious for acute cholecystitis, no evidence of pneumobilia - Symptoms somewhat consistent with gallbladder disease. I think it is reasonable to proceed with laparoscopic cholecystectomy. Patient understands that there is a chance this may not resolve her issues, she understands and agrees to proceed. Plan for surgery later today.  ID - maxipime/flagyl FEN - IVF, NPO VTE - sq heparin Foley - none  Pneumonia Hiatal hernia Hyperparathyroidism Glaucoma  I reviewed hospitalist notes, last 24 h vitals and pain scores, last 48 h intake and output, last 24 h labs and trends, and last 24 h imaging results   LOS: 1 day    Wellington Hampshire, [facility] 03/11/2022, 8:46 AM Please see Amion for pager number during day hours 7:00am-4:30pm

## 2022-03-11 NOTE — Progress Notes (Signed)
Lynn Jenkins NID:[id number] DOB: 04/24/1946 DOA: 03/09/2022 PCP: Lawerance Cruel, MD   Subj: 76 year old Hispanic female PMHx Generalized anxiety disorderPrimary, Lupus ([place]), Hyperparathyroidism, Mixed hyperlipidemia, glaucoma,Vitamin D deficiency  Presents to the ER today with a 3-day history of nausea, vomiting, abdominal pain.  She states that she has episodes of nausea vomiting abdominal pain at least once or twice a week.  These episodes last 2 to 3 days at a time.  She uses Reglan for this.  She states that she has a disabled husband at home.  She has not had this evaluated because she knows that she probably needs her gallbladder taken out.  She has been avoiding this.  Today, she had vomiting in the bathroom.  Her husband saw her.  He told the patient she has to go to the hospital get this evaluated.  She is very concerned about her husband at home.  She states that he is disabled and uses a walker.  She does not have any family to help her.   In the ER, temp 98.1 heart rate 71 blood pressure 121/66.   Labs showed a white count of 18.7, hemoglobin 9.5, platelets 233   Lipase normal at 26   Sodium 141 potassium 3.9 BUN of 30 creatinine 1.36 calcium 11.7   CT scan abdomen pelvis demonstrated pneumobilia.  She has a large hiatal hernia.   Right upper quadrant ultrasound was performed.  I was present at the time the ultrasound tech was performing this.  Pneumobilia was seen.  She also has a large gallstone.  Awaiting final read on her ultrasound.   Obj: 10/2 A/O x4, negative nausea, negative migraine, negative pain s/p surgery    Objective: VITAL SIGNS: Temp: 97.7 F (36.5 C) (10/02 1638) Temp Source: Oral (10/02 1638) BP: 131/86 (10/02 1638) Pulse Rate: 74 (10/02 1638) SPO2; 93% FIO2: 2 L/min   Intake/Output Summary (Last 24 hours) at 03/11/2022 1932 Last data filed at 03/11/2022 1300 Gross per 24 hour  Intake 1560 ml  Output 610 ml  Net 950 ml      Exam: General:  A/O x4, No acute respiratory distress Lungs: Clear to auscultation bilaterally without wheezes or crackles Cardiovascular: Regular rate and rhythm without murmur gallop or rub normal S1 and S2 Abdomen: Negative tenderness to palpation, nondistended, soft, bowel sounds positive, no rebound, no ascites, no appreciable mass Extremities: No significant cyanosis, clubbing, or edema bilateral lower extremities Skin: Negative rashes, lesions, ulcers Psychiatric:  Negative depression, negative anxiety, negative fatigue, negative mania  Central nervous system:  Cranial nerves II through XII intact, tongue/uvula midline, all extremities muscle strength 5/5, sensation intact throughout, negative dysarthria, negative expressive aphasia, negative receptive aphasia.   Mobility Assessment (last 72 hours)     Mobility Assessment     Row Name 03/11/22 0848 03/10/22 0900 03/09/22 2127       Does patient have an order for bedrest or is patient medically unstable No - Continue assessment No - Continue assessment No - Continue assessment     What is the highest level of mobility based on the progressive mobility assessment? Level 5 (Walks with assist in room/hall) - Balance while stepping forward/back and can walk in room with assist - Complete Level 5 (Walks with assist in room/hall) - Balance while stepping forward/back and can walk in room with assist - Complete Level 5 (Walks with assist in room/hall) - Balance while stepping forward/back and can walk in room with assist - Complete  DVT prophylaxis: Subcu heparin Code Status: Full Family Communication:  Status is: Inpatient    Dispo: The patient is from: Home              Anticipated d/c is to: Home              Anticipated d/c date is: > 3 days              Patient currently is not medically stable to d/c.    Procedures/Significant Events: 10/2 s/p Laparoscopic Cholecystectomy   Consultants:   Surgery   Cultures   Antimicrobials: Anti-infectives (From admission, onward)    Start     Ordered Stop   03/10/22 1000  ceFEPIme (MAXIPIME) 2 g in sodium chloride 0.9 % 100 mL IVPB        03/09/22 2019     03/09/22 2000  ceFEPIme (MAXIPIME) 2 g in sodium chloride 0.9 % 100 mL IVPB        03/09/22 1950 03/09/22 2306   03/09/22 1945  metroNIDAZOLE (FLAGYL) IVPB 500 mg        03/09/22 1936          A/P Acute cholecystitis Observation med/surg bed. Continue IV cefepime/flagyl. Secure chat left with general surgery for consult in AM for acute cholecystitis.  Concern for acute cholangitis due to pneumobilia.  Repeat CBC, CMP in the morning. -10/1 per Dr. Nedra Hai general surgery,MRCP to make sure there is no abnormality of the bile duct and to rule out other causes of her symptoms.  If this is unremarkable, we will consider a laparoscopic cholecystectomy versus a HIDA scan tomorrow.  She may have clear liquids after the MRCP and then will be n.p.o. at midnight -10/2 s/p Laparoscopic Cholecystectomy   Hyperparathyroidism, primary ([place]) Continue with IVF  AKI?  (Baseline CR 0.94, 1-year ago) -Renal ultrasound pending Lab Results  Component Value Date   CREATININE 1.04 (H) 03/11/2022   CREATININE 1.23 (H) 03/10/2022   CREATININE 1.36 (H) 03/09/2022   CREATININE 1.09 (H) 02/15/2022   CREATININE 0.94 11/01/2020  -Improving with hydration -10/2 D5-0.9% saline 82m/hr until patient begins to eat.  Anemia - Anemia panel pending -Occult blood   Glaucoma Continue eye drops  Obesity (BMI 33.53 kg/m.) -  Refractory Nausea - 10/1 Thorazine IV 25 mg x 1  Hypothyroidism - Synthroid 88 mcg daily  Migraine - Imitrex subcu 6 mg x 1      Care during the described time interval was provided by me .  I have reviewed this patient's available data, including medical history, events of note, physical examination, and all test results as part of my evaluation.

## 2022-03-11 NOTE — Anesthesia Postprocedure Evaluation (Signed)
Anesthesia Post Note  Patient: Lynn Jenkins  Procedure(s) Performed: LAPAROSCOPIC CHOLECYSTECTOMY WITH icg dye (Abdomen)     Patient location during evaluation: PACU Anesthesia Type: General Level of consciousness: awake and alert Pain management: pain level controlled Vital Signs Assessment: post-procedure vital signs reviewed and stable Respiratory status: spontaneous breathing, nonlabored ventilation, respiratory function stable and patient connected to nasal cannula oxygen Cardiovascular status: blood pressure returned to baseline and stable Postop Assessment: no apparent nausea or vomiting Anesthetic complications: no   No notable events documented.  Last Vitals:  Vitals:   03/11/22 1230 03/11/22 1245  BP: 130/73 (!) 122/97  Pulse: 89 88  Resp: (!) 21 (!) 21  Temp:  36.5 C  SpO2: 91% 92%    Last Pain:  Vitals:   03/11/22 1245  TempSrc:   PainSc: Lytle Bilbo Carcamo

## 2022-03-11 NOTE — Progress Notes (Signed)
Mobility Specialist Cancellation/Refusal Note:    03/11/22 0954  Mobility  Activity Refused mobility     Reason for Cancellation/Refusal: Pt declined mobility at this time. Pt in procedure. Will check back as schedule permits.       [facility]

## 2022-03-11 NOTE — Discharge Instructions (Signed)
[facility]  Please arrive at least 30 min before your appointment to complete your check in paperwork.  If you are unable to arrive 30 min prior to your appointment time we may have to cancel or reschedule you. LAPAROSCOPIC SURGERY: POST OP INSTRUCTIONS Always review your discharge instruction sheet given to you by the facility where your surgery was performed. IF YOU HAVE DISABILITY OR FAMILY LEAVE FORMS, YOU MUST BRING THEM TO THE OFFICE FOR PROCESSING.   DO NOT GIVE THEM TO YOUR DOCTOR.  PAIN CONTROL  First take acetaminophen (Tylenol) AND/or ibuprofen (Advil) to control your pain after surgery.  Follow directions on package.  Taking acetaminophen (Tylenol) and/or ibuprofen (Advil) regularly after surgery will help to control your pain and lower the amount of prescription pain medication you may need.  You should not take more than 4,000 mg (4 grams) of acetaminophen (Tylenol) in 24 hours.  You should not take ibuprofen (Advil), aleve, motrin, naprosyn or other NSAIDS if you have a history of stomach ulcers or chronic kidney disease.  A prescription for pain medication may be given to you upon discharge.  Take your pain medication as prescribed, if you still have uncontrolled pain after taking acetaminophen (Tylenol) or ibuprofen (Advil). Use ice packs to help control pain. If you need a refill on your pain medication, please contact your pharmacy.  They will contact our office to request authorization. Prescriptions will not be filled after 5pm or on week-ends.  HOME MEDICATIONS Take your usually prescribed medications unless otherwise directed.  DIET You should follow a light diet the first few days after arrival home.  Be sure to include lots of fluids daily. Avoid fatty, fried foods.   CONSTIPATION It is common to experience some constipation after surgery and if you are taking pain medication.  Increasing fluid intake and taking a stool softener (such as Colace)  will usually help or prevent this problem from occurring.  A mild laxative (Milk of Magnesia or Miralax) should be taken according to package instructions if there are no bowel movements after 48 hours.  WOUND/INCISION CARE Most patients will experience some swelling and bruising in the area of the incisions.  Ice packs will help.  Swelling and bruising can take several days to resolve.  Unless discharge instructions indicate otherwise, follow guidelines below  STERI-STRIPS - you may remove your outer bandages 48 hours after surgery, and you may shower at that time.  You have steri-strips (small skin tapes) in place directly over the incision.  These strips should be left on the skin for 7-10 days.   DERMABOND/SKIN GLUE - you may shower in 24 hours.  The glue will flake off over the next 2-3 weeks. Any sutures or staples will be removed at the office during your follow-up visit.  ACTIVITIES You may resume regular (light) daily activities beginning the next day--such as daily self-care, walking, climbing stairs--gradually increasing activities as tolerated.  You may have sexual intercourse when it is comfortable.  Refrain from any heavy lifting or straining until approved by your doctor. You may drive when you are no longer taking prescription pain medication, you can comfortably wear a seatbelt, and you can safely maneuver your car and apply brakes.  FOLLOW-UP You should see your doctor in the office for a follow-up appointment approximately 2-3 weeks after your surgery.  You should have been given your post-op/follow-up appointment when your surgery was scheduled.  If you did not receive a post-op/follow-up appointment, make sure   that you call for this appointment within a day or two after you arrive home to insure a convenient appointment time.  OTHER INSTRUCTIONS  WHEN TO CALL YOUR DOCTOR: Fever over 101.0 Inability to urinate Continued bleeding from incision. Increased pain, redness, or  drainage from the incision. Increasing abdominal pain  The clinic staff is available to answer your questions during regular business hours.  Please don't hesitate to call and ask to speak to one of the nurses for clinical concerns.  If you have a medical emergency, go to the nearest emergency room or call 911.  A surgeon from [facility] is always on call at the hospital. [address], [address], [place], [place]  [address] ? [address] [address], [place], [place]   [address] [phone] ? [phone] ? FAX [phone]   

## 2022-03-11 NOTE — Progress Notes (Addendum)
Patient is complaining of pain at her right [facility] where a previous IV had been placed.  Iv has been since removed upon my assessment.   Area is hard and has redness. Patient stated that she has mentioned it to her RN upstairs but wanted to tell Short Stay as well. I told the OR RN and CRNA that is caring for the patient to pass this along to Dr Kae Heller.  Patient is going back for surgery at this moment.    10:19 AM Dr. Kae Heller notified of area on right [facility].  Can treat with ICE to the area

## 2022-03-12 ENCOUNTER — Encounter (HOSPITAL_COMMUNITY): Payer: Self-pay | Admitting: Surgery

## 2022-03-12 DIAGNOSIS — D508 Other iron deficiency anemias: Secondary | ICD-10-CM

## 2022-03-12 DIAGNOSIS — D649 Anemia, unspecified: Secondary | ICD-10-CM | POA: Diagnosis not present

## 2022-03-12 DIAGNOSIS — E210 Primary hyperparathyroidism: Secondary | ICD-10-CM | POA: Diagnosis not present

## 2022-03-12 DIAGNOSIS — D509 Iron deficiency anemia, unspecified: Secondary | ICD-10-CM | POA: Diagnosis present

## 2022-03-12 DIAGNOSIS — K810 Acute cholecystitis: Secondary | ICD-10-CM | POA: Diagnosis not present

## 2022-03-12 DIAGNOSIS — K838 Other specified diseases of biliary tract: Secondary | ICD-10-CM | POA: Diagnosis not present

## 2022-03-12 LAB — COMPREHENSIVE METABOLIC PANEL
ALT: 16 U/L (ref 0–44)
AST: 22 U/L (ref 15–41)
Albumin: 2.9 g/dL — ABNORMAL LOW (ref 3.5–5.0)
Alkaline Phosphatase: 81 U/L (ref 38–126)
Anion gap: 7 (ref 5–15)
BUN: 15 mg/dL (ref 8–23)
CO2: 24 mmol/L (ref 22–32)
Calcium: 10.4 mg/dL — ABNORMAL HIGH (ref 8.9–10.3)
Chloride: 112 mmol/L — ABNORMAL HIGH (ref 98–111)
Creatinine, Ser: 1.06 mg/dL — ABNORMAL HIGH (ref 0.44–1.00)
GFR, Estimated: 55 mL/min — ABNORMAL LOW (ref >60)
Glucose, Bld: 137 mg/dL — ABNORMAL HIGH (ref 70–99)
Potassium: 4.3 mmol/L (ref 3.5–5.1)
Sodium: 143 mmol/L (ref 135–145)
Total Bilirubin: 0.5 mg/dL (ref 0.3–1.2)
Total Protein: 6.6 g/dL (ref 6.5–8.1)

## 2022-03-12 LAB — CBC WITH DIFFERENTIAL/PLATELET
Abs Immature Granulocytes: 0.13 10*3/uL — ABNORMAL HIGH (ref 0.00–0.07)
Basophils Absolute: 0 10*3/uL (ref 0.0–0.1)
Basophils Relative: 0 %
Eosinophils Absolute: 0 10*3/uL (ref 0.0–0.5)
Eosinophils Relative: 0 %
HCT: 32.5 % — ABNORMAL LOW (ref 36.0–46.0)
Hemoglobin: 9.3 g/dL — ABNORMAL LOW (ref 12.0–15.0)
Immature Granulocytes: 1 %
Lymphocytes Relative: 5 %
Lymphs Abs: 0.9 10*3/uL (ref 0.7–4.0)
MCH: 23.4 pg — ABNORMAL LOW (ref 26.0–34.0)
MCHC: 28.6 g/dL — ABNORMAL LOW (ref 30.0–36.0)
MCV: 81.7 fL (ref 80.0–100.0)
Monocytes Absolute: 0.7 10*3/uL (ref 0.1–1.0)
Monocytes Relative: 4 %
Neutro Abs: 17.9 10*3/uL — ABNORMAL HIGH (ref 1.7–7.7)
Neutrophils Relative %: 90 %
Platelets: 256 10*3/uL (ref 150–400)
RBC: 3.98 MIL/uL (ref 3.87–5.11)
RDW: 16.6 % — ABNORMAL HIGH (ref 11.5–15.5)
WBC: 19.7 10*3/uL — ABNORMAL HIGH (ref 4.0–10.5)
nRBC: 0 % (ref 0.0–0.2)

## 2022-03-12 LAB — PHOSPHORUS: Phosphorus: 2.4 mg/dL — ABNORMAL LOW (ref 2.5–4.6)

## 2022-03-12 LAB — MAGNESIUM: Magnesium: 1.9 mg/dL (ref 1.7–2.4)

## 2022-03-12 LAB — SURGICAL PATHOLOGY

## 2022-03-12 MED ORDER — PANTOPRAZOLE SODIUM 40 MG PO TBEC
40.0000 mg | DELAYED_RELEASE_TABLET | Freq: Two times a day (BID) | ORAL | Status: DC
  Administered 2022-03-12 – 2022-03-15 (×6): 40 mg via ORAL
  Filled 2022-03-12 (×6): qty 1

## 2022-03-12 MED ORDER — TRAMADOL HCL 50 MG PO TABS
50.0000 mg | ORAL_TABLET | Freq: Four times a day (QID) | ORAL | Status: DC | PRN
  Administered 2022-03-12 – 2022-03-13 (×2): 50 mg via ORAL
  Filled 2022-03-12 (×3): qty 1

## 2022-03-12 MED ORDER — OXYCODONE HCL 5 MG PO TABS: 5.0000 mg | ORAL_TABLET | ORAL | Status: DC | PRN

## 2022-03-12 MED ORDER — SODIUM CHLORIDE 0.9 % IV SOLN
250.0000 mg | Freq: Once | INTRAVENOUS | Status: AC
  Administered 2022-03-12: 250 mg via INTRAVENOUS
  Filled 2022-03-12: qty 20

## 2022-03-12 MED ORDER — VITAMIN C 500 MG PO TABS
500.0000 mg | ORAL_TABLET | Freq: Every day | ORAL | Status: DC
  Administered 2022-03-12 – 2022-03-15 (×4): 500 mg via ORAL
  Filled 2022-03-12 (×4): qty 1

## 2022-03-12 MED ORDER — SODIUM CHLORIDE 0.9 % IV SOLN: 250.0000 mg | Freq: Once | INTRAVENOUS | Status: DC

## 2022-03-12 MED ORDER — FAMOTIDINE 20 MG PO TABS
20.0000 mg | ORAL_TABLET | Freq: Every day | ORAL | Status: DC
  Administered 2022-03-12 – 2022-03-15 (×4): 20 mg via ORAL
  Filled 2022-03-12 (×4): qty 1

## 2022-03-12 MED ORDER — HYDROMORPHONE HCL 1 MG/ML IJ SOLN: 0.5000 mg | INTRAMUSCULAR | Status: DC | PRN

## 2022-03-12 NOTE — Progress Notes (Signed)
[facility] Progress Note  1 Day Post-Op  Subjective: CC-  Feeling much better than prior to surgery. Abdomen sore but pain is gone. Well controlled with just tylenol. No nausea since surgery. Tolerating clear liquids. Passing flatus, no BM. She felt a little lightheaded the first time she got up but that is also better now. Hoping to go home today.  Objective: Vital signs in last 24 hours: Temp:  [97.7 F (36.5 C)-98.9 F (37.2 C)] 98.5 F (36.9 C) (10/03 0919) Pulse Rate:  [66-89] 66 (10/03 0919) Resp:  [14-23] 18 (10/03 0541) BP: (121-142)/(68-97) 126/82 (10/03 0919) SpO2:  [90 %-97 %] 91 % (10/03 0919) Last BM Date : 03/07/22  Intake/Output from previous day: 10/02 0701 - 10/03 0700 In: 1180 [P.O.:180; I.V.:900; IV Piggyback:100] Out: 10 [Blood:10] Intake/Output this shift: No intake/output data recorded.  PE: Gen:  Alert, NAD, pleasant Abd: soft, ND, nontender, lap incisions cdi without erythema or drainage  Lab Results:  Recent Labs    03/11/22 0331 03/12/22 0433  WBC 9.3 19.7*  HGB 8.6* 9.3*  HCT 30.2* 32.5*  PLT 196 256   BMET Recent Labs    03/11/22 0331 03/12/22 0433  NA 141 143  K 4.0 4.3  CL 113* 112*  CO2 26 24  GLUCOSE 102* 137*  BUN 16 15  CREATININE 1.04* 1.06*  CALCIUM 10.8* 10.4*   PT/INR No results for input(s): "LABPROT", "INR" in the last 72 hours. CMP     Component Value Date/Time   NA 143 03/12/2022 0433   K 4.3 03/12/2022 0433   CL 112 (H) 03/12/2022 0433   CO2 24 03/12/2022 0433   GLUCOSE 137 (H) 03/12/2022 0433   BUN 15 03/12/2022 0433   CREATININE 1.06 (H) 03/12/2022 0433   CREATININE 1.13 (H) 08/20/2019 1226   CALCIUM 10.4 (H) 03/12/2022 0433   PROT 6.6 03/12/2022 0433   ALBUMIN 2.9 (L) 03/12/2022 0433   AST 22 03/12/2022 0433   AST 13 (L) 08/20/2019 1226   ALT 16 03/12/2022 0433   ALT 13 08/20/2019 1226   ALKPHOS 81 03/12/2022 0433   BILITOT 0.5 03/12/2022 0433   BILITOT 0.4 08/20/2019 1226    GFRNONAA 55 (L) 03/12/2022 0433   GFRNONAA 49 (L) 08/20/2019 1226   GFRAA >60 11/14/2019 1455   GFRAA 56 (L) 08/20/2019 1226   Lipase     Component Value Date/Time   LIPASE 26 03/09/2022 1646       Studies/Results: MR ABDOMEN MRCP W WO CONTAST  Result Date: 03/10/2022 CLINICAL DATA:  Gallstones, pneumobilia, no previous biliary procedures EXAM: MRI ABDOMEN WITHOUT AND WITH CONTRAST (INCLUDING MRCP) TECHNIQUE: Multiplanar multisequence MR imaging of the abdomen was performed both before and after the administration of intravenous contrast. Heavily T2-weighted images of the biliary and pancreatic ducts were obtained, and three-dimensional MRCP images were rendered by post processing. CONTRAST:  9 mL Vueway gadolinium contrast IV COMPARISON:  CT abdomen pelvis, 03/09/2022 FINDINGS: Lower chest: Heterogeneous airspace opacity in the bilateral lung bases, assessment of the lung parenchyma limited on MR. Cardiomegaly. Large hiatal hernia. Hepatobiliary: No solid liver abnormality is seen. Mildly distended gallbladder containing a single gallstone. Mild gallbladder wall thickening (series 6, image 23, 20). No biliary ductal dilatation. Pancreas: Unremarkable. No pancreatic ductal dilatation or surrounding inflammatory changes. Spleen: Normal in size without significant abnormality. Adrenals/Urinary Tract: Adrenal glands are unremarkable. Simple, benign bilateral renal cortical cysts, for which no further follow-up or characterization is required. Kidneys are otherwise normal, without renal calculi,  solid lesion, or hydronephrosis. Stomach/Bowel: Stomach is within normal limits. No evidence of bowel wall thickening, distention, or inflammatory changes. Vascular/Lymphatic: No significant vascular findings are present. No enlarged abdominal lymph nodes. Other: No abdominal wall hernia or abnormality. No ascites. Musculoskeletal: No acute or significant osseous findings. IMPRESSION: 1. Cholelithiasis with mild  distention of the gallbladder and gallbladder wall thickening. No biliary ductal dilatation. Findings are suspicious for acute cholecystitis. 2. No evident pneumobilia on today's MR examination, however MR is limited in sensitivity for pneumobilia. 3. Heterogeneous airspace disease in the bilateral lung bases, as seen by prior CT, assessment limited by MR. 4. Large hiatal hernia. Electronically Signed   By: Delanna Ahmadi M.D.   On: 03/10/2022 13:34   MR 3D Recon At Scanner  Result Date: 03/10/2022 CLINICAL DATA:  Gallstones, pneumobilia, no previous biliary procedures EXAM: MRI ABDOMEN WITHOUT AND WITH CONTRAST (INCLUDING MRCP) TECHNIQUE: Multiplanar multisequence MR imaging of the abdomen was performed both before and after the administration of intravenous contrast. Heavily T2-weighted images of the biliary and pancreatic ducts were obtained, and three-dimensional MRCP images were rendered by post processing. CONTRAST:  9 mL Vueway gadolinium contrast IV COMPARISON:  CT abdomen pelvis, 03/09/2022 FINDINGS: Lower chest: Heterogeneous airspace opacity in the bilateral lung bases, assessment of the lung parenchyma limited on MR. Cardiomegaly. Large hiatal hernia. Hepatobiliary: No solid liver abnormality is seen. Mildly distended gallbladder containing a single gallstone. Mild gallbladder wall thickening (series 6, image 23, 20). No biliary ductal dilatation. Pancreas: Unremarkable. No pancreatic ductal dilatation or surrounding inflammatory changes. Spleen: Normal in size without significant abnormality. Adrenals/Urinary Tract: Adrenal glands are unremarkable. Simple, benign bilateral renal cortical cysts, for which no further follow-up or characterization is required. Kidneys are otherwise normal, without renal calculi, solid lesion, or hydronephrosis. Stomach/Bowel: Stomach is within normal limits. No evidence of bowel wall thickening, distention, or inflammatory changes. Vascular/Lymphatic: No significant  vascular findings are present. No enlarged abdominal lymph nodes. Other: No abdominal wall hernia or abnormality. No ascites. Musculoskeletal: No acute or significant osseous findings. IMPRESSION: 1. Cholelithiasis with mild distention of the gallbladder and gallbladder wall thickening. No biliary ductal dilatation. Findings are suspicious for acute cholecystitis. 2. No evident pneumobilia on today's MR examination, however MR is limited in sensitivity for pneumobilia. 3. Heterogeneous airspace disease in the bilateral lung bases, as seen by prior CT, assessment limited by MR. 4. Large hiatal hernia. Electronically Signed   By: Delanna Ahmadi M.D.   On: 03/10/2022 13:34   US RENAL  Result Date: 03/10/2022 CLINICAL DATA:  Acute renal failure. EXAM: RENAL / URINARY TRACT ULTRASOUND COMPLETE COMPARISON:  CT, 03/09/2022. FINDINGS: Right Kidney: Renal measurements: 9.7 x 4.8 x 5.1 cm = volume: 123.4 mL. Normal parenchymal echogenicity. 1.1 cm cyst, anterior midpole. No follow-up indicated. No other masses, no stones and no hydronephrosis. Left Kidney: Renal measurements: 9.7 x 4.6 x 4.5 cm = volume: 106.4 mL. Normal parenchymal echogenicity. Small upper pole cyst, 1 cm size, not well-defined on the previous day's CT. No follow-up recommended. No other renal masses, no stones and no hydronephrosis. Bladder: Appears normal for degree of bladder distention. Other: None. IMPRESSION: 1. No acute findings.  No hydronephrosis. 2. Small, single cysts in each kidney. Electronically Signed   By: Lajean Manes M.D.   On: 03/10/2022 12:01    Anti-infectives: Anti-infectives (From admission, onward)    Start     Dose/Rate Route Frequency Ordered Stop   03/10/22 1000  ceFEPIme (MAXIPIME) 2 g in sodium chloride 0.9 %  100 mL IVPB        2 g 200 mL/hr over 30 Minutes Intravenous Every 12 hours 03/09/22 2019     03/09/22 2000  ceFEPIme (MAXIPIME) 2 g in sodium chloride 0.9 % 100 mL IVPB        2 g 200 mL/hr over 30 Minutes  Intravenous  Once 03/09/22 1950 03/09/22 2306   03/09/22 1945  metroNIDAZOLE (FLAGYL) IVPB 500 mg        500 mg 100 mL/hr over 60 Minutes Intravenous Every 12 hours 03/09/22 1936          Assessment/Plan Cholecystitis POD#1 s/p laparoscopic cholecystectomy 10/2 Dr. Kae Heller - Feeling much better than prior to surgery. Advance diet this morning. Mobilize. Ok for discharge later today vs tomorrow if she continues to improve. Discharge instructions and follow up info on AVS.   ID - maxipime/flagyl FEN - IVF, reg diet VTE - sq heparin Foley - none   Pneumonia Hiatal hernia Hyperparathyroidism Glaucoma    LOS: 2 days    [place], [facility] 03/12/2022, 9:36 AM Please see Amion for pager number during day hours 7:00am-4:30pm

## 2022-03-12 NOTE — Progress Notes (Signed)

## 2022-03-12 NOTE — Evaluation (Signed)
Physical Therapy Evaluation Patient Details Name: Lynn Jenkins MRN: [id number] DOB: 1946-01-20 Today's Date: 03/12/2022  History of Present Illness  76 year old Hispanic female PMHx Generalized anxiety disorder,Primary, Lupus ([place]), Hyperparathyroidism, Mixed hyperlipidemia, glaucoma,Vitamin D deficiency , Left hand injury, right knee injury.    Presents to the ER 03/09/22  with a 3-day history of nausea, vomiting, abdominal pain, S/P LAPAROSCOPIC CHOLECYSTECTOMY on 03/11/22  Clinical Impression  The patient admitted for above problems.  Patient states that she ambulated well with no pain this AM, this PM having significant pain in abdomen and unable to progress ambulation .  Patient declines SNf as she has to take  care of her spouse who relies on her for his  care.  Patient anxious because she ambulated well  with no pain and this PM  pain is limiting.  Hopefully, pain will subside and patient will be able to progress to return home.  Pt admitted with above diagnosis.  Pt currently with functional limitations due to the deficits listed below (see PT Problem List). Pt will benefit from skilled PT to increase their independence and safety with mobility to allow discharge to the venue listed below.        Recommendations for follow up therapy are one component of a multi-disciplinary discharge planning process, led by the attending physician.  Recommendations may be updated based on patient status, additional functional criteria and insurance authorization.  Follow Up Recommendations Home health PT      Assistance Recommended at Discharge PRN  Patient can return home with the following  Assistance with cooking/housework;Assist for transportation    Equipment Recommendations None recommended by PT  Recommendations for Other Services       Functional Status Assessment Patient has had a recent decline in their functional status and demonstrates the ability to make significant improvements  in function in a reasonable and predictable amount of time.     Precautions / Restrictions Precautions Precautions: Fall      Mobility  Bed Mobility               General bed mobility comments: seated on bed edge    Transfers Overall transfer level: Needs assistance Equipment used: Rolling walker (2 wheels) Transfers: Sit to/from Stand Sit to Stand: Mod assist           General transfer comment: patient  guarding right  lower abdomen , increased effort to stand from  bed.    Ambulation/Gait Ambulation/Gait assistance: Mod assist Gait Distance (Feet): 2 Feet Assistive device: Rolling walker (2 wheels) Gait Pattern/deviations: Step-to pattern Gait velocity: decr     General Gait Details: patient having to hold lower abdomen when ambulating, patient only supporting on Left side of RW while holding abdomen, patient unable to progress gait.  Stairs            Wheelchair Mobility    Modified Rankin (Stroke Patients Only)       Balance Overall balance assessment: Mild deficits observed, not formally tested         Standing balance support: During functional activity, Reliant on assistive device for balance                                 Pertinent Vitals/Pain Pain Assessment Pain Assessment: 0-10 Pain Score: 10-Worst pain ever Pain Location: right  lower abdoment Pain Descriptors / Indicators: Constant, Cramping, Grimacing, Guarding Pain Intervention(s): Limited activity within patient's tolerance, Premedicated before  session    Home Living Family/patient expects to be discharged to:: Private residence Living Arrangements: Spouse/significant other Available Help at Discharge: Family (spouse unable to assist, being very ill) Type of Home: House Home Access: Stairs to enter   [employer] of Steps: 1   Home Layout: One level Home Equipment: Conservation officer, nature (2 wheels)      Prior Function Prior Level of Function :  Independent/Modified Independent             Mobility Comments: patient cares for spouse,  prepares his  medications, drives,  takes care of home and meals ADLs Comments: independent     Hand Dominance   Dominant Hand: Right    Extremity/Trunk Assessment   Upper Extremity Assessment Upper Extremity Assessment: Overall WFL for tasks assessed    Lower Extremity Assessment Lower Extremity Assessment: Generalized weakness       Communication   Communication: No difficulties  Cognition Arousal/Alertness: Awake/alert Behavior During Therapy: WFL for tasks assessed/performed Overall Cognitive Status: Within Functional Limits for tasks assessed                                          General Comments      Exercises     Assessment/Plan    PT Assessment Patient needs continued PT services  PT Problem List Decreased mobility;Pain;Decreased activity tolerance       PT Treatment Interventions DME instruction;Therapeutic activities;Gait training;Therapeutic exercise;Patient/family education;Functional mobility training    PT Goals (Current goals can be found in the Care Plan section)  Acute Rehab PT Goals Patient Stated Goal: I have to go home and take care of my spouse PT Goal Formulation: With patient Time For Goal Achievement: 03/26/22 Potential to Achieve Goals: Good    Frequency Min 3X/week     Co-evaluation               AM-PAC PT "6 Clicks" Mobility  Outcome Measure Help needed turning from your back to your side while in a flat bed without using bedrails?: None Help needed moving from lying on your back to sitting on the side of a flat bed without using bedrails?: None Help needed moving to and from a bed to a chair (including a wheelchair)?: A Little Help needed standing up from a chair using your arms (e.g., wheelchair or bedside chair)?: A Little Help needed to walk in hospital room?: A Lot Help needed climbing 3-5 steps with a  railing? : A Lot 6 Click Score: 18    End of Session   Activity Tolerance: Patient limited by pain Patient left:  (on bed edge) Nurse Communication: Mobility status PT Visit Diagnosis: Unsteadiness on feet (R26.81);Pain    Time: 1545-1610 PT Time Calculation (min) (ACUTE ONLY): 25 min   Charges:   PT Evaluation $PT Eval Low Complexity: 1 Low PT Treatments $Gait Training: 8-22 mins        [place] Office [phone] Weekend [phone]   Claretha Cooper 03/12/2022, 4:28 PM

## 2022-03-12 NOTE — Progress Notes (Signed)
Mobility Specialist - Progress Note   03/12/22 1111  Mobility  Activity Ambulated with assistance in hallway  Activity Response Tolerated well  Distance Ambulated (ft) 100 ft  $Mobility charge 1 Mobility  Level of Assistance Contact guard assist, steadying assist  Assistive Device Front wheel walker  [facility] Elevated/Bed Position Self regulated  Range of Motion/Exercises Active  Transport method Ambulatory  Mobility Referral Yes   Pt received in bed and agreeable to mobility. Pt MinA from a sit-to-stand & standby while ambulating. C/o abdomen pain during ambulation. Pt to bed after session with all needs met & nurse in room.   [facility]

## 2022-03-12 NOTE — Progress Notes (Signed)
Lynn Jenkins WSF:[id number] DOB: [birth year] DOA: 03/09/2022 PCP: Lawerance Cruel, MD   Subj: 76 year old Hispanic female PMHx Generalized anxiety disorderPrimary, Lupus ([place]), Hyperparathyroidism, Mixed hyperlipidemia, glaucoma,Vitamin D deficiency  Presents to the ER today with a 3-day history of nausea, vomiting, abdominal pain.  She states that she has episodes of nausea vomiting abdominal pain at least once or twice a week.  These episodes last 2 to 3 days at a time.  She uses Reglan for this.  She states that she has a disabled husband at home.  She has not had this evaluated because she knows that she probably needs her gallbladder taken out.  She has been avoiding this.  Today, she had vomiting in the bathroom.  Her husband saw her.  He told the patient she has to go to the hospital get this evaluated.  She is very concerned about her husband at home.  She states that he is disabled and uses a walker.  She does not have any family to help her.   In the ER, temp 98.1 heart rate 71 blood pressure 121/66.   Labs showed a white count of 18.7, hemoglobin 9.5, platelets 233   Lipase normal at 26   Sodium 141 potassium 3.9 BUN of 30 creatinine 1.36 calcium 11.7   CT scan abdomen pelvis demonstrated pneumobilia.  She has a large hiatal hernia.   Right upper quadrant ultrasound was performed.  I was present at the time the ultrasound tech was performing this.  Pneumobilia was seen.  She also has a large gallstone.  Awaiting final read on her ultrasound.   Obj: 10/3 afebrile overnight, however significant leukocytosis.  Severe postprandial nausea, abdominal pain RUQ after eating solid food.  Increased GERD.    Objective: VITAL SIGNS: Temp: 98.4 F (36.9 C) (10/03 1338) Temp Source: Oral (10/03 1338) BP: 126/77 (10/03 1338) Pulse Rate: 67 (10/03 1338) SPO2; 93% FIO2: 2 L/min   Intake/Output Summary (Last 24 hours) at 03/12/2022 1957 Last data filed at 03/12/2022 1945 Gross per 24  hour  Intake 1161.41 ml  Output 0 ml  Net 1161.41 ml     Exam: General: A/O x4, No acute respiratory distress Lungs: Clear to auscultation bilaterally without wheezes or crackles Cardiovascular: Regular rate and rhythm without murmur gallop or rub normal S1 and S2 Abdomen: Positive tenderness to palpation, nondistended, soft, bowel sounds positive, no rebound, no ascites, no appreciable mass Extremities: No significant cyanosis, clubbing, or edema bilateral lower extremities Skin: Negative rashes, lesions, ulcers Psychiatric:  Negative depression, negative anxiety, negative fatigue, negative mania  Central nervous system:  Cranial nerves II through XII intact, tongue/uvula midline, all extremities muscle strength 5/5, sensation intact throughout, negative dysarthria, negative expressive aphasia, negative receptive aphasia.   Mobility Assessment (last 72 hours)     Mobility Assessment     Row Name 03/12/22 1621 03/12/22 0945 03/11/22 1955 03/11/22 0848 03/10/22 0900   Does patient have an order for bedrest or is patient medically unstable -- No - Continue assessment No - Continue assessment No - Continue assessment No - Continue assessment   What is the highest level of mobility based on the progressive mobility assessment? Level 5 (Walks with assist in room/hall) - Balance while stepping forward/back and can walk in room with assist - Complete Level 5 (Walks with assist in room/hall) - Balance while stepping forward/back and can walk in room with assist - Complete Level 5 (Walks with assist in room/hall) - Balance while stepping forward/back and can walk  in room with assist - Complete Level 5 (Walks with assist in room/hall) - Balance while stepping forward/back and can walk in room with assist - Complete Level 5 (Walks with assist in room/hall) - Balance while stepping forward/back and can walk in room with assist - Complete    Row Name 03/09/22 2127           Does patient have an  order for bedrest or is patient medically unstable No - Continue assessment       What is the highest level of mobility based on the progressive mobility assessment? Level 5 (Walks with assist in room/hall) - Balance while stepping forward/back and can walk in room with assist - Complete                  DVT prophylaxis: Subcu heparin Code Status: Full Family Communication:  Status is: Inpatient    Dispo: The patient is from: Home              Anticipated d/c is to: Home              Anticipated d/c date is: > 3 days              Patient currently is not medically stable to d/c.    Procedures/Significant Events: 10/2 s/p Laparoscopic Cholecystectomy   Consultants:  Surgery   Cultures   Antimicrobials: Anti-infectives (From admission, onward)    Start     Ordered Stop   03/10/22 1000  ceFEPIme (MAXIPIME) 2 g in sodium chloride 0.9 % 100 mL IVPB        03/09/22 2019     03/09/22 2000  ceFEPIme (MAXIPIME) 2 g in sodium chloride 0.9 % 100 mL IVPB        03/09/22 1950 03/09/22 2306   03/09/22 1945  metroNIDAZOLE (FLAGYL) IVPB 500 mg        03/09/22 1936            A/P Acute cholecystitis Observation med/surg bed. Continue IV cefepime/flagyl. Secure chat left with general surgery for consult in AM for acute cholecystitis.  Concern for acute cholangitis due to pneumobilia.  Repeat CBC, CMP in the morning. -10/1 per Dr. Nedra Hai general surgery,MRCP to make sure there is no abnormality of the bile duct and to rule out other causes of her symptoms.  If this is unremarkable, we will consider a laparoscopic cholecystectomy versus a HIDA scan tomorrow.  She may have clear liquids after the MRCP and then will be n.p.o. at midnight -10/2 s/p Laparoscopic Cholecystectomy  Leukocytosis - 10/3 afebrile however significant increase in WBC (demargination/reactive?).  In addition significant increase abdominal pain.  We will continue antibiotics for now.  Acute abdominal  pain - 10/3 secondary to consuming solid food.  Postprandial nausea, vomiting. - 10/3 decrease diet to full liquid diet - DC all narcotics.  Patient states makes her extremely loopy and does not like that side effect. - 10/3 willing to try Tramadol PRN -10/3 would not discharge until patient able to eat and drink.  GERD - Continue Protonix 40 mg BID -10/3 Famotidine 20 mg daily    Hyperparathyroidism, primary ([place]) Continue with IVF  AKI?  (Baseline CR 0.94, 1-year ago) -Renal ultrasound pending Lab Results  Component Value Date   CREATININE 1.06 (H) 03/12/2022   CREATININE 1.04 (H) 03/11/2022   CREATININE 1.23 (H) 03/10/2022   CREATININE 1.36 (H) 03/09/2022   CREATININE 1.09 (H) 02/15/2022  -Improving with hydration -10/2 D5-0.9%  saline 7m/hr until patient begins to eat.  Iron deficiency anemia - Anemia panel consistent with iron deficiency anemia -FE/TIBC ratio= 0.04 -10/3 iron dextran IV 250 mg + vitamin C 500 mg -Occult blood   Glaucoma Continue eye drops  Obesity (BMI 33.53 kg/m.) -  Refractory Nausea - 10/1 Thorazine IV 25 mg x 1  Hypothyroidism - Synthroid 88 mcg daily  Migraine - Imitrex subcu 6 mg x 1   Goals of care - 10/3 face-to-face place for HH/PT   Care during the described time interval was provided by me .  I have reviewed this patient's available data, including medical history, events of note, physical examination, and all test results as part of my evaluation.

## 2022-03-12 NOTE — TOC Transition Note (Addendum)
Transition of Care [facility]) - CM/SW Discharge Note   Patient Details  Name: Lynn Jenkins MRN: [id number] Date of Birth: Apr 04, 1946  [facility]) CM/SW Contact:  Ross Ludwig, LCSW Phone Number: 03/12/2022, 5:13 PM   Clinical Narrative:     CSW was informed that patient will need [facility] services.  CSW spoke to patient and she did not have a preference for an agency.  Patient is agreeable to have [facility] see her.  Patient requested [facility] [facility], OT, aide, and Soc work to help with her getting therapy at home.  PT suggested SNF placement, but patient said she has a husband she needs to take care of, so she is declining SNF but would like [place] instead.  Patient confirmed [place] choice, and CSW contacted [facility] and spoke to [place], they can accept her. CSW requested [place] orders from attending physician.    Final next level of care: [place] Barriers to Discharge: Continued Medical Work up   Patient Goals and CMS Choice Patient states their goals for this hospitalization and ongoing recovery are:: To go home with home health. CMS Medicare.gov Compare Post Acute Care list provided to:: Patient Choice offered to / list presented to : Patient  Discharge Placement                       Discharge Plan and Services                          HH Arranged: PT, OT, Nurse's Aide, Refused SNF, Social Work [facility] Agency: [place] ([place]) Date [place]: 03/12/22 Time [place]: 1713 Representative spoke with at [place]: [place] ([place]) Interventions     Readmission Risk Interventions     No data to display

## 2022-03-13 DIAGNOSIS — D649 Anemia, unspecified: Secondary | ICD-10-CM | POA: Diagnosis not present

## 2022-03-13 DIAGNOSIS — E038 Other specified hypothyroidism: Secondary | ICD-10-CM

## 2022-03-13 DIAGNOSIS — K810 Acute cholecystitis: Secondary | ICD-10-CM | POA: Diagnosis not present

## 2022-03-13 DIAGNOSIS — D508 Other iron deficiency anemias: Secondary | ICD-10-CM

## 2022-03-13 DIAGNOSIS — E210 Primary hyperparathyroidism: Secondary | ICD-10-CM | POA: Diagnosis not present

## 2022-03-13 DIAGNOSIS — R112 Nausea with vomiting, unspecified: Secondary | ICD-10-CM

## 2022-03-13 LAB — CBC WITH DIFFERENTIAL/PLATELET
Abs Immature Granulocytes: 0.07 10*3/uL (ref 0.00–0.07)
Basophils Absolute: 0 10*3/uL (ref 0.0–0.1)
Basophils Relative: 1 %
Eosinophils Absolute: 0.1 10*3/uL (ref 0.0–0.5)
Eosinophils Relative: 1 %
HCT: 28.3 % — ABNORMAL LOW (ref 36.0–46.0)
Hemoglobin: 8.2 g/dL — ABNORMAL LOW (ref 12.0–15.0)
Immature Granulocytes: 1 %
Lymphocytes Relative: 18 %
Lymphs Abs: 1.5 10*3/uL (ref 0.7–4.0)
MCH: 23.6 pg — ABNORMAL LOW (ref 26.0–34.0)
MCHC: 29 g/dL — ABNORMAL LOW (ref 30.0–36.0)
MCV: 81.3 fL (ref 80.0–100.0)
Monocytes Absolute: 0.5 10*3/uL (ref 0.1–1.0)
Monocytes Relative: 5 %
Neutro Abs: 6.3 10*3/uL (ref 1.7–7.7)
Neutrophils Relative %: 74 %
Platelets: 218 10*3/uL (ref 150–400)
RBC: 3.48 MIL/uL — ABNORMAL LOW (ref 3.87–5.11)
RDW: 16.9 % — ABNORMAL HIGH (ref 11.5–15.5)
WBC: 8.5 10*3/uL (ref 4.0–10.5)
nRBC: 0 % (ref 0.0–0.2)

## 2022-03-13 LAB — COMPREHENSIVE METABOLIC PANEL
ALT: 13 U/L (ref 0–44)
AST: 15 U/L (ref 15–41)
Albumin: 2.7 g/dL — ABNORMAL LOW (ref 3.5–5.0)
Alkaline Phosphatase: 68 U/L (ref 38–126)
Anion gap: 4 — ABNORMAL LOW (ref 5–15)
BUN: 15 mg/dL (ref 8–23)
CO2: 23 mmol/L (ref 22–32)
Calcium: 10.5 mg/dL — ABNORMAL HIGH (ref 8.9–10.3)
Chloride: 113 mmol/L — ABNORMAL HIGH (ref 98–111)
Creatinine, Ser: 1.08 mg/dL — ABNORMAL HIGH (ref 0.44–1.00)
GFR, Estimated: 54 mL/min — ABNORMAL LOW (ref >60)
Glucose, Bld: 119 mg/dL — ABNORMAL HIGH (ref 70–99)
Potassium: 3.8 mmol/L (ref 3.5–5.1)
Sodium: 140 mmol/L (ref 135–145)
Total Bilirubin: 0.4 mg/dL (ref 0.3–1.2)
Total Protein: 6 g/dL — ABNORMAL LOW (ref 6.5–8.1)

## 2022-03-13 LAB — MAGNESIUM: Magnesium: 1.9 mg/dL (ref 1.7–2.4)

## 2022-03-13 LAB — TSH: TSH: 3.785 u[IU]/mL (ref 0.350–4.500)

## 2022-03-13 LAB — PHOSPHORUS: Phosphorus: 2.3 mg/dL — ABNORMAL LOW (ref 2.5–4.6)

## 2022-03-13 LAB — T4, FREE: Free T4: 0.98 ng/dL (ref 0.61–1.12)

## 2022-03-13 MED ORDER — ACETAMINOPHEN 500 MG PO TABS
1000.0000 mg | ORAL_TABLET | Freq: Four times a day (QID) | ORAL | Status: DC
  Administered 2022-03-13 – 2022-03-15 (×9): 1000 mg via ORAL
  Filled 2022-03-13 (×9): qty 2

## 2022-03-13 MED ORDER — METHOCARBAMOL 1000 MG/10ML IJ SOLN: 500.0000 mg | Freq: Four times a day (QID) | INTRAVENOUS | Status: DC | PRN

## 2022-03-13 MED ORDER — POTASSIUM & SODIUM PHOSPHATES 280-160-250 MG PO PACK
1.0000 | PACK | Freq: Three times a day (TID) | ORAL | Status: DC
  Administered 2022-03-13 – 2022-03-14 (×2): 1 via ORAL
  Filled 2022-03-13 (×4): qty 1

## 2022-03-13 MED ORDER — ORAL CARE MOUTH RINSE: 15.0000 mL | OROMUCOSAL | Status: DC | PRN

## 2022-03-13 NOTE — Progress Notes (Signed)
PROGRESS NOTE    Lynn Jenkins  JOI:[id number] DOB: 1945/07/16 DOA: 03/09/2022 PCP: Lawerance Cruel, MD    Chief Complaint  Patient presents with   Abdominal Pain   Nausea    Brief Narrative:   76 year old Hispanic female PMHx Generalized anxiety disorderPrimary, Lupus ([place]), Hyperparathyroidism, Mixed hyperlipidemia, glaucoma,Vitamin D deficiency   Presents to the ER today with a 3-day history of nausea, vomiting, abdominal pain.  CT abdomen pelvis demonstrated pneumobilia Right upper quadrant ultrasound showed acute cholecystitis  Assessment & Plan:   Principal Problem:   Acute cholecystitis Active Problems:   Hyperparathyroidism, primary (HCC)   Glaucoma   Obesity (BMI 30-39.9)   Hypothyroidism   Anemia, unspecified   Migraine headache without aura   Iron deficiency anemia   Acute cholecystitis General surgery on board underwent laparoscopic cholecystectomy on 03/11/2022. Pain control, symptomatic Management with IV antiemetics. She was started on clears and advance as tolerated. Patient reports persistent nausea and abdominal pain today we will watch her overnight for any worsening of symptoms and plan for discharge in the morning if her symptoms improved.    Primary hyperparathyroidism s/p parathyroidectomy Calcium continues to remain elevated at 10.5 Recommend checking PTH and calcium levels.    Hypothyroidism Check TSH and resume home dose of Synthroid.    Iron deficiency anemia Supplementation added. IV dextran given.    Migraine headaches 1 dose of Imitrex given.  Body mass index is 34.67 kg/m. Obesity Poor prognostic feature   DVT prophylaxis: Heparin Code Status: Full code Family Communication: None at bedside Disposition:   Status is: Inpatient Remains inpatient appropriate because: Nausea and persistent abdominal pain   Level of care: Med-Surg Consultants:  GEN surgery   procedures:   Antimicrobials:  None   Subjective: Abdominal pain and nausea have improved  Objective: Vitals:   03/12/22 2107 03/13/22 0500 03/13/22 0554 03/13/22 1407  BP: (!) 154/96  122/68 115/72  Pulse: 66  62 (!) 59  Resp: '14  14 18  '$ Temp: 98.5 F (36.9 C)  98 F (36.7 C) 98.8 F (37.1 C)  TempSrc: Oral  Oral Oral  SpO2: 97%  96% 92%  Weight:  94.5 kg    Height:        Intake/Output Summary (Last 24 hours) at 03/13/2022 1416 Last data filed at 03/13/2022 1000 Gross per 24 hour  Intake 1269.65 ml  Output 600 ml  Net 669.65 ml   Filed Weights   03/10/22 0500 03/11/22 0504 03/13/22 0500  Weight: 91.4 kg 87.8 kg 94.5 kg    Examination:  General exam: Appears calm and comfortable  Respiratory system: Clear to auscultation. Respiratory effort normal. Cardiovascular system: S1 & S2 heard, RRR. No JVD, . No pedal edema. Gastrointestinal system: Abdomen is nondistended, soft and nontender. Normal bowel sounds heard. Central nervous system: Alert and oriented. No focal neurological deficits. Extremities: Symmetric 5 x 5 power. Skin: No rashes, lesions or ulcers Psychiatry: Mood & affect appropriate.     Data Reviewed: I have personally reviewed following labs and imaging studies  CBC: Recent Labs  Lab 03/09/22 1646 03/10/22 0528 03/11/22 0331 03/12/22 0433 03/13/22 0519  WBC 18.7* 13.6* 9.3 19.7* 8.5  NEUTROABS 16.4* 11.6* 7.5 17.9* 6.3  HGB 9.5* 8.2* 8.6* 9.3* 8.2*  HCT 32.3* 27.7* 30.2* 32.5* 28.3*  MCV 79.8* 81.0 82.5 81.7 81.3  PLT 233 190 196 256 470    Basic Metabolic Panel: Recent Labs  Lab 03/09/22 1646 03/10/22 0528 03/11/22 0331 03/12/22 0433 03/13/22 9628  NA 141 142 141 143 140  K 3.9 4.0 4.0 4.3 3.8  CL 109 113* 113* 112* 113*  CO2 '26 25 26 24 23  '$ GLUCOSE 113* 99 102* 137* 119*  BUN 30* '22 16 15 15  '$ CREATININE 1.36* 1.23* 1.04* 1.06* 1.08*  CALCIUM 11.7* 10.5* 10.8* 10.4* 10.5*  MG  --   --  1.9 1.9 1.9  PHOS  --   --  2.5 2.4* 2.3*    GFR: Estimated  Creatinine Clearance: 51.2 mL/min (A) (by C-G formula based on SCr of 1.08 mg/dL (H)).  Liver Function Tests: Recent Labs  Lab 03/09/22 1646 03/10/22 0528 03/11/22 0331 03/12/22 0433 03/13/22 0519  AST 15 12* 12* 22 15  ALT '16 12 12 16 13  '$ ALKPHOS 78 60 68 81 68  BILITOT 0.8 0.5 0.7 0.5 0.4  PROT 7.2 5.9* 6.3* 6.6 6.0*  ALBUMIN 3.6 2.6* 2.9* 2.9* 2.7*    CBG: No results for input(s): "GLUCAP" in the last 168 hours.   Recent Results (from the past 240 hour(s))  Resp Panel by RT-PCR (Flu A&B, Covid) Anterior Nasal Swab     Status: None   Collection Time: 03/09/22  7:38 PM   Specimen: Anterior Nasal Swab  Result Value Ref Range Status   SARS Coronavirus 2 by RT PCR NEGATIVE NEGATIVE Final    Comment: (NOTE) SARS-CoV-2 target nucleic acids are NOT DETECTED.  The SARS-CoV-2 RNA is generally detectable in upper respiratory specimens during the acute phase of infection. The lowest concentration of SARS-CoV-2 viral copies this assay can detect is 138 copies/mL. A negative result does not preclude SARS-Cov-2 infection and should not be used as the sole basis for treatment or other patient management decisions. A negative result may occur with  improper specimen collection/handling, submission of specimen other than nasopharyngeal swab, presence of viral mutation(s) within the areas targeted by this assay, and inadequate number of viral copies(<138 copies/mL). A negative result must be combined with clinical observations, patient history, and epidemiological information. The expected result is Negative.  Fact Sheet for Patients:  EntrepreneurPulse.com.au  Fact Sheet for Healthcare Providers:  IncredibleEmployment.be  This test is no t yet approved or cleared by the [place] FDA and  has been authorized for detection and/or diagnosis of SARS-CoV-2 by FDA under an Emergency Use Authorization (EUA). This EUA will remain  in effect  (meaning this test can be used) for the duration of the COVID-19 declaration under Section 564(b)(1) of the Act, 21 U.S.C.section 360bbb-3(b)(1), unless the authorization is terminated  or revoked sooner.       Influenza A by PCR NEGATIVE NEGATIVE Final   Influenza B by PCR NEGATIVE NEGATIVE Final    Comment: (NOTE) The Xpert Xpress SARS-CoV-2/FLU/RSV plus assay is intended as an aid in the diagnosis of influenza from Nasopharyngeal swab specimens and should not be used as a sole basis for treatment. Nasal washings and aspirates are unacceptable for Xpert Xpress SARS-CoV-2/FLU/RSV testing.  Fact Sheet for Patients: EntrepreneurPulse.com.au  Fact Sheet for Healthcare Providers: IncredibleEmployment.be  This test is not yet approved or cleared by the [place] FDA and has been authorized for detection and/or diagnosis of SARS-CoV-2 by FDA under an Emergency Use Authorization (EUA). This EUA will remain in effect (meaning this test can be used) for the duration of the COVID-19 declaration under Section 564(b)(1) of the Act, 21 U.S.C. section 360bbb-3(b)(1), unless the authorization is terminated or revoked.  Performed at [facility], [address] [place],  [place] [address]          Radiology Studies: No results found.      Scheduled Meds:  acetaminophen  1,000 mg Oral Q6H   vitamin C  500 mg Oral Daily   famotidine  20 mg Oral Daily   heparin  5,000 Units Subcutaneous Q8H   latanoprost  1 drop Both Eyes QHS   levothyroxine  88 mcg Oral Q0600   pantoprazole  40 mg Oral BID   Continuous Infusions:  methocarbamol (ROBAXIN) IV       LOS: 3 days        Hosie Poisson, MD [facility]   To contact the attending provider between 7A-7P or the covering provider during after hours 7P-7A, please log into the web site [email or web address] and access using universal [place] password for that web site.  If you do not have the password, please call the hospital operator.  03/13/2022, 2:16 PM

## 2022-03-13 NOTE — Progress Notes (Signed)
Mobility Specialist - Progress Note   03/13/22 1532  Mobility  Activity Ambulated with assistance in hallway  Activity Response Tolerated well  Distance Ambulated (ft) 350 ft  $Mobility charge 1 Mobility  Level of Assistance Contact guard assist, steadying assist  Assistive Device Front wheel walker  [facility] Elevated/Bed Position Self regulated  Range of Motion/Exercises Active  Transport method Ambulatory  Mobility Referral Yes   Pt received in bed and agreeable to mobility. No complaints during ambulation. Assisted pt w/ ADLs/ Pt to bathroom at EOS w/ nurse in room & necessities in reach. [facility]

## 2022-03-13 NOTE — Progress Notes (Signed)
Mobility Specialist - Progress Note   03/13/22 1134  Mobility  Activity Ambulated with assistance in hallway  Activity Response Tolerated well  Distance Ambulated (ft) 280 ft  $Mobility charge 1 Mobility  Level of Assistance Contact guard assist, steadying assist  Assistive Device Front wheel walker  [facility] Elevated/Bed Position Self regulated  Range of Motion/Exercises Active  Transport method Ambulatory  Mobility Referral Yes   Pt received in bed & a little hesitant to ambulate due to pain in her abdomen area. Assisted pt to the bathroom. Pt was in pain during ambulation. Pt left EOB at EOS with all necessities in reach along with the call bell.  [facility]

## 2022-03-13 NOTE — Progress Notes (Signed)
[facility] Progress Note  2 Days Post-Op  Subjective: CC-  Developed nausea and incisional pain yesterday.  Gestures to her left subcostal and epigastric regions as the areas of pain.  Objective: Vital signs in last 24 hours: Temp:  [98 F (36.7 C)-98.5 F (36.9 C)] 98 F (36.7 C) (10/04 0554) Pulse Rate:  [62-67] 62 (10/04 0554) Resp:  [14] 14 (10/04 0554) BP: (122-154)/(68-96) 122/68 (10/04 0554) SpO2:  [91 %-97 %] 96 % (10/04 0554) Weight:  [94.5 kg] 94.5 kg (10/04 0500) Last BM Date : 03/12/22  Intake/Output from previous day: 10/03 0701 - 10/04 0700 In: 1850.9 [P.O.:480; I.V.:424.2; IV Piggyback:946.7] Out: -  Intake/Output this shift: Total I/O In: -  Out: 600 [Urine:600]  PE: Gen:  Alert, NAD, pleasant Abd: soft, ND, nontender, lap incisions cdi with Dermabond.  She does have evolving ecchymosis and localized tenderness surrounding the epigastric port site as well as the Veress puncture site in the left subcostal area.  Lab Results:  Recent Labs    03/12/22 0433 03/13/22 0519  WBC 19.7* 8.5  HGB 9.3* 8.2*  HCT 32.5* 28.3*  PLT 256 218    BMET Recent Labs    03/12/22 0433 03/13/22 0519  NA 143 140  K 4.3 3.8  CL 112* 113*  CO2 24 23  GLUCOSE 137* 119*  BUN 15 15  CREATININE 1.06* 1.08*  CALCIUM 10.4* 10.5*    PT/INR No results for input(s): "LABPROT", "INR" in the last 72 hours. CMP     Component Value Date/Time   NA 140 03/13/2022 0519   K 3.8 03/13/2022 0519   CL 113 (H) 03/13/2022 0519   CO2 23 03/13/2022 0519   GLUCOSE 119 (H) 03/13/2022 0519   BUN 15 03/13/2022 0519   CREATININE 1.08 (H) 03/13/2022 0519   CREATININE 1.13 (H) 08/20/2019 1226   CALCIUM 10.5 (H) 03/13/2022 0519   PROT 6.0 (L) 03/13/2022 0519   ALBUMIN 2.7 (L) 03/13/2022 0519   AST 15 03/13/2022 0519   AST 13 (L) 08/20/2019 1226   ALT 13 03/13/2022 0519   ALT 13 08/20/2019 1226   ALKPHOS 68 03/13/2022 0519   BILITOT 0.4 03/13/2022 0519   BILITOT  0.4 08/20/2019 1226   GFRNONAA 54 (L) 03/13/2022 0519   GFRNONAA 49 (L) 08/20/2019 1226   GFRAA >60 11/14/2019 1455   GFRAA 56 (L) 08/20/2019 1226   Lipase     Component Value Date/Time   LIPASE 26 03/09/2022 1646       Studies/Results: No results found.  Anti-infectives: Anti-infectives (From admission, onward)    Start     Dose/Rate Route Frequency Ordered Stop   03/10/22 1000  ceFEPIme (MAXIPIME) 2 g in sodium chloride 0.9 % 100 mL IVPB        2 g 200 mL/hr over 30 Minutes Intravenous Every 12 hours 03/09/22 2019     03/09/22 2000  ceFEPIme (MAXIPIME) 2 g in sodium chloride 0.9 % 100 mL IVPB        2 g 200 mL/hr over 30 Minutes Intravenous  Once 03/09/22 1950 03/09/22 2306   03/09/22 1945  metroNIDAZOLE (FLAGYL) IVPB 500 mg        500 mg 100 mL/hr over 60 Minutes Intravenous Every 12 hours 03/09/22 1936          Assessment/Plan Cholecystitis POD#2 s/p laparoscopic cholecystectomy 10/2 Dr. Kae Heller -Unfortunately nausea and pain persisting this morning.  Her pain seems to be localized at her incisions, benign abdominal exam and  white count has normalized.  Mobilize.  Multimodal pain regimen added.  [place] for discharge later today vs tomorrow if she improves. Discharge instructions and follow up info on AVS.   ID - maxipime/flagyl FEN - IVF, reg diet VTE - sq heparin Foley - none   Pneumonia Hiatal hernia Hyperparathyroidism Glaucoma    LOS: 3 days    Clovis Riley, MD [facility] 03/13/2022, 8:39 AM Please see Amion for pager number during day hours 7:00am-4:30pm

## 2022-03-13 NOTE — Care Management Important Message (Signed)
Important Message  Patient Details IM Letter given to the Patient Name: Lynn Jenkins MRN: [id number] Date of Birth: 1945/10/20   Medicare Important Message Given:  Yes     Kerin Salen 03/13/2022, 11:12 AM

## 2022-03-14 DIAGNOSIS — D649 Anemia, unspecified: Secondary | ICD-10-CM | POA: Diagnosis not present

## 2022-03-14 DIAGNOSIS — E038 Other specified hypothyroidism: Secondary | ICD-10-CM | POA: Diagnosis not present

## 2022-03-14 DIAGNOSIS — E210 Primary hyperparathyroidism: Secondary | ICD-10-CM | POA: Diagnosis not present

## 2022-03-14 DIAGNOSIS — K810 Acute cholecystitis: Secondary | ICD-10-CM | POA: Diagnosis not present

## 2022-03-14 LAB — CBC WITH DIFFERENTIAL/PLATELET
Abs Immature Granulocytes: 0.2 10*3/uL — ABNORMAL HIGH (ref 0.00–0.07)
Basophils Absolute: 0.1 10*3/uL (ref 0.0–0.1)
Basophils Relative: 1 %
Eosinophils Absolute: 0.3 10*3/uL (ref 0.0–0.5)
Eosinophils Relative: 4 %
HCT: 30.2 % — ABNORMAL LOW (ref 36.0–46.0)
Hemoglobin: 8.4 g/dL — ABNORMAL LOW (ref 12.0–15.0)
Immature Granulocytes: 3 %
Lymphocytes Relative: 23 %
Lymphs Abs: 1.5 10*3/uL (ref 0.7–4.0)
MCH: 22.7 pg — ABNORMAL LOW (ref 26.0–34.0)
MCHC: 27.8 g/dL — ABNORMAL LOW (ref 30.0–36.0)
MCV: 81.6 fL (ref 80.0–100.0)
Monocytes Absolute: 0.4 10*3/uL (ref 0.1–1.0)
Monocytes Relative: 7 %
Neutro Abs: 4.1 10*3/uL (ref 1.7–7.7)
Neutrophils Relative %: 62 %
Platelets: 231 10*3/uL (ref 150–400)
RBC: 3.7 MIL/uL — ABNORMAL LOW (ref 3.87–5.11)
RDW: 17 % — ABNORMAL HIGH (ref 11.5–15.5)
WBC: 6.5 10*3/uL (ref 4.0–10.5)
nRBC: 0.3 % — ABNORMAL HIGH (ref 0.0–0.2)

## 2022-03-14 LAB — COMPREHENSIVE METABOLIC PANEL
ALT: 13 U/L (ref 0–44)
AST: 11 U/L — ABNORMAL LOW (ref 15–41)
Albumin: 2.6 g/dL — ABNORMAL LOW (ref 3.5–5.0)
Alkaline Phosphatase: 69 U/L (ref 38–126)
Anion gap: 5 (ref 5–15)
BUN: 12 mg/dL (ref 8–23)
CO2: 26 mmol/L (ref 22–32)
Calcium: 11.7 mg/dL — ABNORMAL HIGH (ref 8.9–10.3)
Chloride: 114 mmol/L — ABNORMAL HIGH (ref 98–111)
Creatinine, Ser: 1.06 mg/dL — ABNORMAL HIGH (ref 0.44–1.00)
GFR, Estimated: 55 mL/min — ABNORMAL LOW (ref >60)
Glucose, Bld: 102 mg/dL — ABNORMAL HIGH (ref 70–99)
Potassium: 3.9 mmol/L (ref 3.5–5.1)
Sodium: 145 mmol/L (ref 135–145)
Total Bilirubin: 0.3 mg/dL (ref 0.3–1.2)
Total Protein: 6 g/dL — ABNORMAL LOW (ref 6.5–8.1)

## 2022-03-14 LAB — MAGNESIUM: Magnesium: 1.9 mg/dL (ref 1.7–2.4)

## 2022-03-14 LAB — PHOSPHORUS: Phosphorus: 2.9 mg/dL (ref 2.5–4.6)

## 2022-03-14 MED ORDER — FUROSEMIDE 10 MG/ML IJ SOLN
INTRAMUSCULAR | Status: AC
  Filled 2022-03-14: qty 2

## 2022-03-14 MED ORDER — SODIUM CHLORIDE 0.9 % IV SOLN: INTRAVENOUS | Status: DC

## 2022-03-14 MED ORDER — FUROSEMIDE 10 MG/ML IJ SOLN
40.0000 mg | Freq: Once | INTRAMUSCULAR | Status: AC
  Administered 2022-03-14: 40 mg via INTRAVENOUS
  Filled 2022-03-14: qty 4

## 2022-03-14 MED ORDER — FUROSEMIDE 10 MG/ML IJ SOLN
20.0000 mg | Freq: Once | INTRAMUSCULAR | Status: DC
  Filled 2022-03-14: qty 2

## 2022-03-14 NOTE — Progress Notes (Signed)
At bedside for PV placement.  Pt up in chair bathing.  RN notified to place consult when pt ready.

## 2022-03-14 NOTE — Progress Notes (Signed)
[facility] Progress Note  3 Days Post-Op  Subjective: CC-  Some persistent nausea yesterday, no emesis. Abdominal pain improved. States that she did better with PO intake yesterday compared to the day prior. WBC 6.5, LFTs WNL  Objective: Vital signs in last 24 hours: Temp:  [97.7 F (36.5 C)-98.8 F (37.1 C)] 97.7 F (36.5 C) (10/05 0518) Pulse Rate:  [57-59] 58 (10/05 0518) Resp:  [16-18] 16 (10/05 0518) BP: (115-132)/(72-84) 132/84 (10/05 0518) SpO2:  [92 %-95 %] 93 % (10/05 0518) Weight:  [94.9 kg] 94.9 kg (10/05 0500) Last BM Date : 03/12/22  Intake/Output from previous day: 10/04 0701 - 10/05 0700 In: 1603.4 [P.O.:960; I.V.:443.4; IV Piggyback:200] Out: 2000 [Urine:2000] Intake/Output this shift: No intake/output data recorded.  PE:  Gen:  Alert, NAD, pleasant Abd: soft, ND, mild tenderness at epigastric port, lap incisions cdi with Dermabond  Lab Results:  Recent Labs    03/13/22 0519 03/14/22 0446  WBC 8.5 6.5  HGB 8.2* 8.4*  HCT 28.3* 30.2*  PLT 218 231   BMET Recent Labs    03/13/22 0519 03/14/22 0446  NA 140 145  K 3.8 3.9  CL 113* 114*  CO2 23 26  GLUCOSE 119* 102*  BUN 15 12  CREATININE 1.08* 1.06*  CALCIUM 10.5* 11.7*   PT/INR No results for input(s): "LABPROT", "INR" in the last 72 hours. CMP     Component Value Date/Time   NA 145 03/14/2022 0446   K 3.9 03/14/2022 0446   CL 114 (H) 03/14/2022 0446   CO2 26 03/14/2022 0446   GLUCOSE 102 (H) 03/14/2022 0446   BUN 12 03/14/2022 0446   CREATININE 1.06 (H) 03/14/2022 0446   CREATININE 1.13 (H) 08/20/2019 1226   CALCIUM 11.7 (H) 03/14/2022 0446   PROT 6.0 (L) 03/14/2022 0446   ALBUMIN 2.6 (L) 03/14/2022 0446   AST 11 (L) 03/14/2022 0446   AST 13 (L) 08/20/2019 1226   ALT 13 03/14/2022 0446   ALT 13 08/20/2019 1226   ALKPHOS 69 03/14/2022 0446   BILITOT 0.3 03/14/2022 0446   BILITOT 0.4 08/20/2019 1226   GFRNONAA 55 (L) 03/14/2022 0446   GFRNONAA 49 (L) 08/20/2019  1226   GFRAA >60 11/14/2019 1455   GFRAA 56 (L) 08/20/2019 1226   Lipase     Component Value Date/Time   LIPASE 26 03/09/2022 1646       Studies/Results: No results found.  Anti-infectives: Anti-infectives (From admission, onward)    Start     Dose/Rate Route Frequency Ordered Stop   03/10/22 1000  ceFEPIme (MAXIPIME) 2 g in sodium chloride 0.9 % 100 mL IVPB  Status:  Discontinued        2 g 200 mL/hr over 30 Minutes Intravenous Every 12 hours 03/09/22 2019 03/13/22 1338   03/09/22 2000  ceFEPIme (MAXIPIME) 2 g in sodium chloride 0.9 % 100 mL IVPB        2 g 200 mL/hr over 30 Minutes Intravenous  Once 03/09/22 1950 03/09/22 2306   03/09/22 1945  metroNIDAZOLE (FLAGYL) IVPB 500 mg  Status:  Discontinued        500 mg 100 mL/hr over 60 Minutes Intravenous Every 12 hours 03/09/22 1936 03/13/22 1338        Assessment/Plan Cholecystitis POD#3 s/p laparoscopic cholecystectomy 10/2 Dr. Kae Heller - Pain improving but still with persistent nausea at times. She did do better with PO intake yesterday. Discussed with primary team, this could be in part from her hypercalcemia which they  are treating. Not ready for discharge due to hypercalcemia. Discharge instructions and follow up info on AVS.   ID - maxipime/flagyl 9/30>>10/4 FEN - IVF per TRH, reg diet VTE - sq heparin Foley - none  Hyperparathyroidism - PTH pending Pneumonia Hiatal hernia Glaucoma    LOS: 4 days    Wellington Hampshire, [facility] 03/14/2022, 10:16 AM Please see Amion for pager number during day hours 7:00am-4:30pm

## 2022-03-14 NOTE — Progress Notes (Signed)
1000 lasix dose was not given, but pulled from pyxis, pt has no working IV and medication was wasted in pt room sharps. Rona Ravens witnessed this waste.

## 2022-03-14 NOTE — Progress Notes (Signed)
IV consult placed that pt was ready for IV.  Pt currently up in chair at bedside.  Nursing staff to place another IV consult once pt is in the bed, not eating, and agreeable to IV start.

## 2022-03-14 NOTE — Progress Notes (Signed)
Mobility Specialist - Progress Note   03/14/22 1635  Mobility  Activity Ambulated with assistance in hallway  Activity Response Tolerated well  Distance Ambulated (ft) 1000 ft  $Mobility charge 1 Mobility  Level of Assistance Standby assist, set-up cues, supervision of patient - no hands on  Assistive Device Front wheel walker  [facility] Elevated/Bed Position Self regulated  Range of Motion/Exercises Active  Transport method Ambulatory  Mobility Referral Yes   Pt received in bed and agreeable to mobility. C/o abdomen pain during mobility. Pt to bed after session with all needs met & nurse in room.  [facility]

## 2022-03-14 NOTE — Progress Notes (Signed)
At bedside to place PIV per consult.  Pt eating breakfast, requested to come back later.  Pt began crying re IV stick "It just hurts so bad".

## 2022-03-14 NOTE — Progress Notes (Signed)
PROGRESS NOTE    Lynn Jenkins  ZOX:[id number] DOB: 20-Aug-1945 DOA: 03/09/2022 PCP: Lawerance Cruel, MD    Chief Complaint  Patient presents with   Abdominal Pain   Nausea    Brief Narrative:   76 year old Hispanic female PMHx Generalized anxiety disorderPrimary, Lupus ([place]), Hyperparathyroidism, Mixed hyperlipidemia, glaucoma,Vitamin D deficiency   Presents to the ER today with a 3-day history of nausea, vomiting, abdominal pain.  CT abdomen pelvis demonstrated pneumobilia Right upper quadrant ultrasound showed acute cholecystitis  Assessment & Plan:   Principal Problem:   Acute cholecystitis Active Problems:   Hyperparathyroidism, primary (HCC)   Glaucoma   Obesity (BMI 30-39.9)   Hypothyroidism   Anemia, unspecified   Migraine headache without aura   Iron deficiency anemia   Acute cholecystitis General surgery on board underwent laparoscopic cholecystectomy on 03/11/2022. Pain control, symptomatic Management with IV antiemetics. She was started on clears and advance as tolerated. Patient reports persistent nausea and abdominal pain , suspect from hypercalcemia rather than the cholecystectomy.     Primary hyperparathyroidism s/p parathyroidectomy Calcium continues to remain elevated , corrected calcium level at 12.8 Recommend checking PTH and calcium levels. Levels pending.  Start her on IV fluds and IV lasix.  Will check the levels in am before starting her on sensipar/ cinacalcet.     Hypothyroidism TSH WNL. and resume home dose of Synthroid.    Iron deficiency anemia Supplementation added. IV  iron dextran given.    Migraine headaches 1 dose of Imitrex given. Resolved.   Body mass index is 34.82 kg/m. Obesity Poor prognostic feature   DVT prophylaxis: Heparin Code Status: Full code Family Communication: None at bedside Disposition:   Status is: Inpatient Remains inpatient appropriate because: Nausea and persistent abdominal pain    Level of care: Med-Surg Consultants:  GEN surgery   procedures:   Antimicrobials: None   Subjective: Slightly improved abd pain. No vomiting.   Objective: Vitals:   03/13/22 2007 03/14/22 0500 03/14/22 0518 03/14/22 1431  BP: 128/73  132/84 (!) 152/83  Pulse: (!) 57  (!) 58 (!) 51  Resp:   16 18  Temp: 97.9 F (36.6 C)  97.7 F (36.5 C) 98.1 F (36.7 C)  TempSrc: Oral  Oral Oral  SpO2: 95%  93% (!) 87%  Weight:  94.9 kg    Height:        Intake/Output Summary (Last 24 hours) at 03/14/2022 1748 Last data filed at 03/14/2022 1745 Gross per 24 hour  Intake 1510 ml  Output 3350 ml  Net -1840 ml    Filed Weights   03/11/22 0504 03/13/22 0500 03/14/22 0500  Weight: 87.8 kg 94.5 kg 94.9 kg    Examination:  General exam: Appears calm and comfortable  Respiratory system: Clear to auscultation. Respiratory effort normal. Cardiovascular system: S1 & S2 heard, RRR. No JVD,  No pedal edema. Gastrointestinal system: Abdomen is soft, mildly tender, bs + Central nervous system: Alert and oriented. No focal neurological deficits. Extremities: Symmetric 5 x 5 power. Skin: No rashes, lesions or ulcers Psychiatry:  Mood & affect appropriate.      Data Reviewed: I have personally reviewed following labs and imaging studies  CBC: Recent Labs  Lab 03/10/22 0528 03/11/22 0331 03/12/22 0433 03/13/22 0519 03/14/22 0446  WBC 13.6* 9.3 19.7* 8.5 6.5  NEUTROABS 11.6* 7.5 17.9* 6.3 4.1  HGB 8.2* 8.6* 9.3* 8.2* 8.4*  HCT 27.7* 30.2* 32.5* 28.3* 30.2*  MCV 81.0 82.5 81.7 81.3 81.6  PLT 190 196  256 218 231     Basic Metabolic Panel: Recent Labs  Lab 03/10/22 0528 03/11/22 0331 03/12/22 0433 03/13/22 0519 03/14/22 0446  NA 142 141 143 140 145  K 4.0 4.0 4.3 3.8 3.9  CL 113* 113* 112* 113* 114*  CO2 '25 26 24 23 26  '$ GLUCOSE 99 102* 137* 119* 102*  BUN '22 16 15 15 12  '$ CREATININE 1.23* 1.04* 1.06* 1.08* 1.06*  CALCIUM 10.5* 10.8* 10.4* 10.5* 11.7*  MG  --  1.9 1.9  1.9 1.9  PHOS  --  2.5 2.4* 2.3* 2.9     GFR: Estimated Creatinine Clearance: 52.3 mL/min (A) (by C-G formula based on SCr of 1.06 mg/dL (H)).  Liver Function Tests: Recent Labs  Lab 03/10/22 0528 03/11/22 0331 03/12/22 0433 03/13/22 0519 03/14/22 0446  AST 12* 12* 22 15 11*  ALT '12 12 16 13 13  '$ ALKPHOS 60 68 81 68 69  BILITOT 0.5 0.7 0.5 0.4 0.3  PROT 5.9* 6.3* 6.6 6.0* 6.0*  ALBUMIN 2.6* 2.9* 2.9* 2.7* 2.6*     CBG: No results for input(s): "GLUCAP" in the last 168 hours.   Recent Results (from the past 240 hour(s))  Resp Panel by RT-PCR (Flu A&B, Covid) Anterior Nasal Swab     Status: None   Collection Time: 03/09/22  7:38 PM   Specimen: Anterior Nasal Swab  Result Value Ref Range Status   SARS Coronavirus 2 by RT PCR NEGATIVE NEGATIVE Final    Comment: (NOTE) SARS-CoV-2 target nucleic acids are NOT DETECTED.  The SARS-CoV-2 RNA is generally detectable in upper respiratory specimens during the acute phase of infection. The lowest concentration of SARS-CoV-2 viral copies this assay can detect is 138 copies/mL. A negative result does not preclude SARS-Cov-2 infection and should not be used as the sole basis for treatment or other patient management decisions. A negative result may occur with  improper specimen collection/handling, submission of specimen other than nasopharyngeal swab, presence of viral mutation(s) within the areas targeted by this assay, and inadequate number of viral copies(<138 copies/mL). A negative result must be combined with clinical observations, patient history, and epidemiological information. The expected result is Negative.  Fact Sheet for Patients:  EntrepreneurPulse.com.au  Fact Sheet for Healthcare Providers:  IncredibleEmployment.be  This test is no t yet approved or cleared by the [place] FDA and  has been authorized for detection and/or diagnosis of SARS-CoV-2 by FDA under an  Emergency Use Authorization (EUA). This EUA will remain  in effect (meaning this test can be used) for the duration of the COVID-19 declaration under Section 564(b)(1) of the Act, 21 U.S.C.section 360bbb-3(b)(1), unless the authorization is terminated  or revoked sooner.       Influenza A by PCR NEGATIVE NEGATIVE Final   Influenza B by PCR NEGATIVE NEGATIVE Final    Comment: (NOTE) The Xpert Xpress SARS-CoV-2/FLU/RSV plus assay is intended as an aid in the diagnosis of influenza from Nasopharyngeal swab specimens and should not be used as a sole basis for treatment. Nasal washings and aspirates are unacceptable for Xpert Xpress SARS-CoV-2/FLU/RSV testing.  Fact Sheet for Patients: EntrepreneurPulse.com.au  Fact Sheet for Healthcare Providers: IncredibleEmployment.be  This test is not yet approved or cleared by the [place] FDA and has been authorized for detection and/or diagnosis of SARS-CoV-2 by FDA under an Emergency Use Authorization (EUA). This EUA will remain in effect (meaning this test can be used) for the duration of the COVID-19 declaration under Section 564(b)(1) of the Act,  21 U.S.C. section 360bbb-3(b)(1), unless the authorization is terminated or revoked.  Performed at [facility], [place] [address], [place], [place] [address]          Radiology Studies: No results found.      Scheduled Meds:  acetaminophen  1,000 mg Oral Q6H   vitamin C  500 mg Oral Daily   famotidine  20 mg Oral Daily   furosemide       heparin  5,000 Units Subcutaneous Q8H   latanoprost  1 drop Both Eyes QHS   levothyroxine  88 mcg Oral Q0600   pantoprazole  40 mg Oral BID   Continuous Infusions:  sodium chloride 100 mL/hr at 03/14/22 1327   methocarbamol (ROBAXIN) IV       LOS: 4 days        Hosie Poisson, MD [facility]   To contact the attending provider between 7A-7P or the covering provider  during after hours 7P-7A, please log into the web site [email or web address] and access using universal [place] password for that web site. If you do not have the password, please call the hospital operator.  03/14/2022, 5:48 PM

## 2022-03-15 DIAGNOSIS — K810 Acute cholecystitis: Secondary | ICD-10-CM | POA: Diagnosis not present

## 2022-03-15 DIAGNOSIS — E038 Other specified hypothyroidism: Secondary | ICD-10-CM | POA: Diagnosis not present

## 2022-03-15 DIAGNOSIS — E210 Primary hyperparathyroidism: Secondary | ICD-10-CM | POA: Diagnosis not present

## 2022-03-15 LAB — PTH, INTACT AND CALCIUM
Calcium, Total (PTH): 10.6 mg/dL — ABNORMAL HIGH (ref 8.7–10.3)
PTH: 58 pg/mL (ref 15–65)

## 2022-03-15 LAB — CBC WITH DIFFERENTIAL/PLATELET
Abs Immature Granulocytes: 0.4 10*3/uL — ABNORMAL HIGH (ref 0.00–0.07)
Basophils Absolute: 0.1 10*3/uL (ref 0.0–0.1)
Basophils Relative: 1 %
Eosinophils Absolute: 0.3 10*3/uL (ref 0.0–0.5)
Eosinophils Relative: 4 %
HCT: 32.8 % — ABNORMAL LOW (ref 36.0–46.0)
Hemoglobin: 9.5 g/dL — ABNORMAL LOW (ref 12.0–15.0)
Immature Granulocytes: 5 %
Lymphocytes Relative: 20 %
Lymphs Abs: 1.6 10*3/uL (ref 0.7–4.0)
MCH: 23.3 pg — ABNORMAL LOW (ref 26.0–34.0)
MCHC: 29 g/dL — ABNORMAL LOW (ref 30.0–36.0)
MCV: 80.4 fL (ref 80.0–100.0)
Monocytes Absolute: 0.4 10*3/uL (ref 0.1–1.0)
Monocytes Relative: 5 %
Neutro Abs: 5.5 10*3/uL (ref 1.7–7.7)
Neutrophils Relative %: 65 %
Platelets: 265 10*3/uL (ref 150–400)
RBC: 4.08 MIL/uL (ref 3.87–5.11)
RDW: 16.9 % — ABNORMAL HIGH (ref 11.5–15.5)
WBC: 8.4 10*3/uL (ref 4.0–10.5)
nRBC: 0.4 % — ABNORMAL HIGH (ref 0.0–0.2)

## 2022-03-15 LAB — COMPREHENSIVE METABOLIC PANEL
ALT: 12 U/L (ref 0–44)
AST: 15 U/L (ref 15–41)
Albumin: 2.8 g/dL — ABNORMAL LOW (ref 3.5–5.0)
Alkaline Phosphatase: 74 U/L (ref 38–126)
Anion gap: 7 (ref 5–15)
BUN: 12 mg/dL (ref 8–23)
CO2: 27 mmol/L (ref 22–32)
Calcium: 11.6 mg/dL — ABNORMAL HIGH (ref 8.9–10.3)
Chloride: 109 mmol/L (ref 98–111)
Creatinine, Ser: 1.01 mg/dL — ABNORMAL HIGH (ref 0.44–1.00)
GFR, Estimated: 58 mL/min — ABNORMAL LOW (ref >60)
Glucose, Bld: 104 mg/dL — ABNORMAL HIGH (ref 70–99)
Potassium: 3.6 mmol/L (ref 3.5–5.1)
Sodium: 143 mmol/L (ref 135–145)
Total Bilirubin: 0.3 mg/dL (ref 0.3–1.2)
Total Protein: 6.3 g/dL — ABNORMAL LOW (ref 6.5–8.1)

## 2022-03-15 LAB — MAGNESIUM: Magnesium: 1.5 mg/dL — ABNORMAL LOW (ref 1.7–2.4)

## 2022-03-15 LAB — VITAMIN D 25 HYDROXY (VIT D DEFICIENCY, FRACTURES): Vit D, 25-Hydroxy: 27.51 ng/mL — ABNORMAL LOW (ref 30–100)

## 2022-03-15 LAB — T3, FREE: T3, Free: 1.7 pg/mL — ABNORMAL LOW (ref 2.0–4.4)

## 2022-03-15 LAB — PHOSPHORUS: Phosphorus: 2.5 mg/dL (ref 2.5–4.6)

## 2022-03-15 MED ORDER — POLYETHYLENE GLYCOL 3350 17 G PO PACK: 17.0000 g | PACK | Freq: Every day | ORAL | Status: DC | PRN

## 2022-03-15 MED ORDER — MAGNESIUM SULFATE 2 GM/50ML IV SOLN
2.0000 g | Freq: Once | INTRAVENOUS | Status: AC
  Administered 2022-03-15: 2 g via INTRAVENOUS
  Filled 2022-03-15: qty 50

## 2022-03-15 MED ORDER — CINACALCET HCL 30 MG PO TABS: 30.0000 mg | ORAL_TABLET | Freq: Every day | ORAL | 0 refills | Status: DC

## 2022-03-15 MED ORDER — FERROUS SULFATE 325 (65 FE) MG PO TABS: 325.0000 mg | ORAL_TABLET | Freq: Two times a day (BID) | ORAL | 3 refills | Status: DC

## 2022-03-15 MED ORDER — FERROUS SULFATE 325 (65 FE) MG PO TABS: 325.0000 mg | ORAL_TABLET | Freq: Two times a day (BID) | ORAL | Status: DC

## 2022-03-15 MED ORDER — TRAMADOL HCL 50 MG PO TABS: 50.0000 mg | ORAL_TABLET | Freq: Four times a day (QID) | ORAL | 0 refills | Status: DC | PRN

## 2022-03-15 MED ORDER — CINACALCET HCL 30 MG PO TABS: 30.0000 mg | ORAL_TABLET | Freq: Every day | ORAL | Status: DC

## 2022-03-15 NOTE — Plan of Care (Signed)

## 2022-03-15 NOTE — Consult Note (Signed)
Renal Service Consult Note [facility]  Ruie Sendejo 03/15/2022 Sol Blazing, MD Requesting Physician: Dr. Karleen Hampshire  Reason for Consult: Hypercalcemia, hx of primary hyperparathyroidism HPI: The patient is a 76 y.o. year-old w/ hx of glaucoma, primary hyperparathyroidism, obesity, hypthyroid, anemia, migraine HA and fe def anemia admitted for acute cholecystitis , is now sp lap chole on 03/11/22, 4 days ago. She has high Ca++ levels here in the 10.5- 11.7 range. Since having her surgery her chronic recurrent nausea and R abd pain are much better. She has had 2 surgeries in the last 3 yrs by Dr Harlow Asa to remove parathyroid adenomas. However her Ca++ levels have remained high. Pt if feeling better and cleared for dc by the surgeons, but her Ca++ remains high in 10.5- 11.5 range and we are asked to see for hypercalcemia.    Pt seen in room. For months she has been having vomiting episodes and pains in her R abdomen. 4 days after GB removed states no nausea now, and her post pain is improving. No other c/o's. No confusion, memory issues. She endorses osteoporosis, and I checked her last Dexa imaging which showed -2.7 which is low.   ROS - denies CP, no joint pain, no HA, no blurry vision, no rash, no diarrhea, no nausea/ vomiting, no dysuria, no difficulty voiding   Past Medical History  Past Medical History:  Diagnosis Date   Anemia    Arthritis    Generalized anxiety disorder    GERD (gastroesophageal reflux disease)    Glaucoma    Headache    hx of migraines    History of hiatal hernia    Hyperthyroidism    Lupus (HCC)    Migraine    Mixed hyperlipidemia    Multinodular goiter    Osteopenia    Primary hyperparathyroidism ([place])    Varicose veins    Vitamin D deficiency    Past Surgical History  Past Surgical History:  Procedure Laterality Date   ABDOMINAL HYSTERECTOMY     APPENDECTOMY     CHOLECYSTECTOMY N/A 03/11/2022   Procedure: LAPAROSCOPIC CHOLECYSTECTOMY  WITH icg dye;  Surgeon: Clovis Riley, MD;  Location: [facility];  Service: General;  Laterality: N/A;   JOINT REPLACEMENT     left foot surgery      PARATHYROIDECTOMY N/A 11/01/2014   Procedure: NECK EXPLORATION AND PARATHYROIDECTOMY;  Surgeon: Armandina Gemma, MD;  Location: [facility];  Service: General;  Laterality: N/A;   PARATHYROIDECTOMY N/A 10/31/2020   Procedure: PARATHYROIDECTOMY WITH LEFT THYROID LOBECTOMY;  Surgeon: Armandina Gemma, MD;  Location: [facility];  Service: General;  Laterality: N/A;   SHOULDER SURGERY     Family History  Family History  Problem Relation Age of Onset   Heart disease Mother    Hyperlipidemia Mother    Hypertension Mother    Heart disease Father    Hyperlipidemia Father    Hypertension Father    Cancer Brother    Hypertension Brother    Social History  reports that she has never smoked. She has never used smokeless tobacco. She reports that she does not drink alcohol and does not use drugs. Allergies  Allergies  Allergen Reactions   Doxycycline Swelling    Facial swelling, itching   Morphine And Related Swelling   Zomig [Zolmitriptan] Nausea And Vomiting    Increased heart rate    Augmentin [Amoxicillin-Pot Clavulanate] Diarrhea   Ceftin [Cefuroxime Axetil] Other (See Comments)    Stomach ache   Gabapentin Other (  See Comments)    Confusion, off balance   Biaxin [Clarithromycin] Rash   Cefdinir Rash   Sumatriptan Other (See Comments)    Can use brand Imitrex   Home medications Prior to Admission medications   Medication Sig Start Date End Date Taking? Authorizing Provider  acetaminophen (TYLENOL) 500 MG tablet Take 500-1,000 mg by mouth every 6 (six) hours as needed for mild pain or moderate pain.   Yes [provider]  cholecalciferol (VITAMIN D) 1000 UNITS tablet Take 1,000 Units by mouth daily.   Yes [provider]  co-enzyme Q-10 30 MG capsule Take 30 mg by mouth daily.   Yes [provider]  esomeprazole (NEXIUM) 40  MG capsule Take 40 mg by mouth 2 (two) times daily before a meal.    Yes [provider]  furosemide (LASIX) 20 MG tablet Take 20 mg by mouth daily. 08/26/18  Yes [provider]  Glucosamine-Chondroit-Vit C-Mn (GLUCOSAMINE 1500 COMPLEX) CAPS Take 1 capsule by mouth daily.   Yes [provider]  leflunomide (ARAVA) 10 MG tablet Take 10 mg by mouth daily.   Yes [provider]  levothyroxine (SYNTHROID) 88 MCG tablet Take 88 mcg by mouth daily. 03/06/22  Yes [provider]  metoCLOPramide (REGLAN) 10 MG tablet Take 1 tablet (10 mg total) by mouth every 6 (six) hours as needed for nausea or vomiting. Patient taking differently: Take 5-10 mg by mouth every 6 (six) hours as needed for nausea or vomiting. 11/14/19  Yes Hayden Rasmussen, MD  Misc Natural Products (TOTAL MEMORY & FOCUS FORMULA PO) Take 2 tablets by mouth daily.   Yes [provider]  Multiple Vitamin (MULTIVITAMIN WITH MINERALS) TABS tablet Take 1 tablet by mouth daily.   Yes [provider]  Multiple Vitamins-Minerals (HAIR/SKIN/NAILS) TABS Take 1 tablet by mouth daily.   Yes [provider]  Omega 3 1000 MG CAPS Take 2,000 mg by mouth 2 (two) times daily.   Yes [provider]  sertraline (ZOLOFT) 100 MG tablet Take 100 mg by mouth daily.   Yes [provider]  SUMAtriptan (IMITREX) 100 MG tablet Take 100 mg by mouth every 2 (two) hours as needed for migraine or headache. 1 tab at onset of headache, may reoeat  In 2 hrs, max of 2 pills in 24 hrs 08/26/18  Yes [provider]  vitamin B-12 (CYANOCOBALAMIN) 1000 MCG tablet Take 1,000 mcg by mouth daily.   Yes [provider]  vitamin C (ASCORBIC ACID) 500 MG tablet Take 500 mg by mouth daily as needed (when sick).    Yes [provider]  XALATAN 0.005 % ophthalmic solution Place 1 drop into the right eye at bedtime. 03/18/15  Yes [provider]  ondansetron (ZOFRAN ODT)  4 MG disintegrating tablet Take 1 tablet (4 mg total) by mouth every 8 (eight) hours as needed for nausea or vomiting. Patient not taking: Reported on 10/20/2020 12/18/17   [name], MD  traMADol (ULTRAM) 50 MG tablet Take 1-2 tablets (50-100 mg total) by mouth every 6 (six) hours as needed for moderate pain. 11/01/20   Armandina Gemma, MD  traMADol (ULTRAM) 50 MG tablet Take 1 tablet (50 mg total) by mouth every 6 (six) hours as needed for severe pain. 03/15/22   Wellington Hampshire, PA-C     Vitals:   03/14/22 2019 03/15/22 0449 03/15/22 0519 03/15/22 1355  BP: (!) 182/96  (!) 171/92 (!) 147/88  Pulse: (!) 57  Marland Kitchen)  59 63  Resp: '18  18 18  ' Temp: 98.2 F (36.8 C)  97.9 F (36.6 C) 98.1 F (36.7 C)  TempSrc: Oral  Oral Oral  SpO2: 95%  95% 97%  Weight:  91 kg    Height:       Exam Gen alert, no distress No rash, cyanosis or gangrene Sclera anicteric, throat clear  No jvd or bruits Chest clear bilat to bases, no rales/ wheezing RRR no MRG Abd soft ntnd no mass or ascites +bs GU defer MS no joint effusions or deformity Ext no LE or UE edema, no wounds or ulcers Neuro is alert, Ox 3 , nf   Home meds include - nexium, lasix 20 qd, leflunomide, synthroid, reglan, sertraline, tramadol prn, vits/ eyedrops/ supps/ prns    BP 140/ 88, HR 65  RR 18 afeb   Na 143  K 3.6  CO2 27  BUN 12  creat 1.01  eGFR 58 ml/min    Ca 10.8- 11. 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 Calcium levels have remained high (10.5- 11.5) since 2016, when pt had her 1st surgery. Pt had 2nd surgery in 2022 but Ca++ levels remained high. Pt here now for GB issues w/ nausea/ abd pain, much  better after cholecystectomy. Appears to be asymptomatic and would not recommend further hospitalization for this. Attending has d/w Dr Harlow Asa and also pt has a referral to endocrinologist pending in OP setting. Other treatments to consider are bisphosphonates (given low bone density) and/or cinacalcet (to get Ca++ levels down). Per UTD for this patient would not be unreasonable to use both if needed. Will defer to her specialists in the OP setting regarding treatment options. Should be okay for dc, I don't think the hypercalcemia is causing symptoms. Have d/w pmd. Will sign off.  SP cholecystectomy       Kelly Splinter  MD 03/15/2022, 2:39 PM Recent Labs  Lab 03/14/22 0446 03/15/22 0502  HGB 8.4* 9.5*  ALBUMIN 2.6* 2.8*  CALCIUM 11.7* 11.6*  PHOS 2.9 2.5  CREATININE 1.06* 1.01*  K 3.9 3.6   Inpatient medications:  acetaminophen  1,000 mg Oral Q6H   vitamin C  500 mg Oral Daily   [START ON 03/16/2022] cinacalcet  30 mg Oral Q breakfast   famotidine  20 mg Oral Daily   ferrous sulfate  325 mg Oral BID WC   heparin  5,000 Units Subcutaneous Q8H   latanoprost  1 drop Both Eyes QHS   levothyroxine  88 mcg Oral Q0600   pantoprazole  40 mg Oral BID    sodium chloride 100 mL/hr at 03/15/22 0817   methocarbamol (ROBAXIN) IV     melatonin, methocarbamol (ROBAXIN) IV, ondansetron (ZOFRAN) IV, mouth rinse, polyethylene glycol, traMADol

## 2022-03-15 NOTE — Discharge Summary (Signed)
Physician Discharge Summary   Patient: Lynn Jenkins MRN: [id number] DOB: [birth year]  Admit date:     03/09/2022  Discharge date: 03/15/22  Discharge Physician: Hosie Poisson   PCP: Lawerance Cruel, MD   Recommendations at discharge:   Please follow up with Dr Chalmers Cater in one week.  Please follow up with Dr Harlow Asa as recommended.  Please follow up with PCP in one week.  Please check bmp in one week.   Discharge Diagnoses: Principal Problem:   Acute cholecystitis Active Problems:   Hyperparathyroidism, primary ([place])   Glaucoma   Obesity (BMI 30-39.9)   Hypothyroidism   Anemia, unspecified   Migraine headache without aura   Iron deficiency anemia    Hospital Course:   76 year old Hispanic female PMHx Generalized anxiety disorderPrimary, Lupus ([place]), Hyperparathyroidism, Mixed hyperlipidemia, glaucoma,Vitamin D deficiency   Presents to the ER today with a 3-day history of nausea, vomiting, abdominal pain.  CT abdomen pelvis demonstrated pneumobilia Right upper quadrant ultrasound showed acute cholecystitis  Assessment and Plan:   Acute cholecystitis General surgery on board underwent laparoscopic cholecystectomy on 03/11/2022. Pain control, symptomatic Management with IV antiemetics. She was started on clears and advanced as tolerated.     Primary hyperparathyroidism s/p parathyroidectomy Calcium continues to remain elevated , corrected calcium level at 12.8 Recommend checking PTH and calcium levels, vitamin d level.  Patient examined today and totally asymptomatic. Discussed with Dr Jonnie Finner, Nephrology, started her on daily cinacalcet and recommended to follow up with Dr Chalmers Cater in one week.     Hypothyroidism TSH WNL. and resume home dose of Synthroid.       Iron deficiency anemia Supplementation added. IV  iron dextran given.       Migraine headaches 1 dose of Imitrex given. Resolved.    Body mass index is 34.82 kg/m. Obesity Poor prognostic  feature       Consultants: nephrology Surgery.  Procedures performed: cholecystectomy.   Disposition: Home Diet recommendation:  Discharge Diet Orders (From admission, onward)     Start     Ordered   03/15/22 0000  Diet - low sodium heart healthy        03/15/22 1453           Regular diet DISCHARGE MEDICATION: Allergies as of 03/15/2022       Reactions   Doxycycline Swelling   Facial swelling, itching   Morphine And Related Swelling   Zomig [zolmitriptan] Nausea And Vomiting   Increased heart rate    Augmentin [amoxicillin-pot Clavulanate] Diarrhea   Ceftin [cefuroxime Axetil] Other (See Comments)   Stomach ache   Gabapentin Other (See Comments)   Confusion, off balance   Biaxin [clarithromycin] Rash   Cefdinir Rash   Sumatriptan Other (See Comments)   Can use brand Imitrex        Medication List     STOP taking these medications    Hair/Skin/Nails Tabs   multivitamin with minerals Tabs tablet   ondansetron 4 MG disintegrating tablet Commonly known as: Zofran ODT       TAKE these medications    acetaminophen 500 MG tablet Commonly known as: TYLENOL Take 500-1,000 mg by mouth every 6 (six) hours as needed for mild pain or moderate pain.   ascorbic acid 500 MG tablet Commonly known as: VITAMIN C Take 500 mg by mouth daily as needed (when sick).   cholecalciferol 1000 units tablet Commonly known as: VITAMIN D Take 1,000 Units by mouth daily.   cinacalcet 30 MG tablet Commonly  known as: SENSIPAR Take 1 tablet (30 mg total) by mouth daily with breakfast. Start taking on: March 16, 2022   co-enzyme Q-10 30 MG capsule Take 30 mg by mouth daily.   cyanocobalamin 1000 MCG tablet Commonly known as: VITAMIN B12 Take 1,000 mcg by mouth daily.   esomeprazole 40 MG capsule Commonly known as: NEXIUM Take 40 mg by mouth 2 (two) times daily before a meal.   ferrous sulfate 325 (65 FE) MG tablet Take 1 tablet (325 mg total) by mouth 2 (two)  times daily with a meal.   furosemide 20 MG tablet Commonly known as: LASIX Take 20 mg by mouth daily.   Glucosamine 1500 Complex Caps Take 1 capsule by mouth daily.   leflunomide 10 MG tablet Commonly known as: ARAVA Take 10 mg by mouth daily.   levothyroxine 88 MCG tablet Commonly known as: SYNTHROID Take 88 mcg by mouth daily.   metoCLOPramide 10 MG tablet Commonly known as: REGLAN Take 1 tablet (10 mg total) by mouth every 6 (six) hours as needed for nausea or vomiting. What changed: how much to take   Omega 3 1000 MG Caps Take 2,000 mg by mouth 2 (two) times daily.   sertraline 100 MG tablet Commonly known as: ZOLOFT Take 100 mg by mouth daily.   SUMAtriptan 100 MG tablet Commonly known as: IMITREX Take 100 mg by mouth every 2 (two) hours as needed for migraine or headache. 1 tab at onset of headache, may reoeat  In 2 hrs, max of 2 pills in 24 hrs   TOTAL MEMORY & FOCUS FORMULA PO Take 2 tablets by mouth daily.   traMADol 50 MG tablet Commonly known as: ULTRAM Take 1 tablet (50 mg total) by mouth every 6 (six) hours as needed for severe pain. What changed:  how much to take reasons to take this   Xalatan 0.005 % ophthalmic solution Generic drug: latanoprost Place 1 drop into the right eye at bedtime.        Follow-up [facility], [place]. Go on 04/02/2022.   Specialty: General Surgery Why: Your appointment is 04/02/22 at 10:30 am  Arrive 54mn early to check in, fill out paperwork, BEngineer, civil (consulting)ID and iDoctor, general practiceinformation: [email or web address] EllynFollow up.   Why: [facility], aide, and social work.  They will reach out to you to set up the first visit for [facility] Contact information: [phone]         GArmandina Gemma MD. Call.    Specialty: General Surgery Why: Our office will contact you with an appointment for your elevated calcium Contact information: [phone]         BJacelyn Pi MD. Schedule an appointment as soon as possible for a visit in 1 week(s).   Specialty: Endocrinology Why: for Hypercalcemia Contact information: [phone]         RLawerance Cruel MD. Schedule an appointment as soon as possible for a visit in 1 week(s).   Specialty: Family Medicine Why: to monitor hypercalcemia. Contact information: 1[address]. [phone]               Discharge  Exam: Filed Weights   03/13/22 0500 03/14/22 0500 03/15/22 0449  Weight: 94.5 kg 94.9 kg 91 kg   General exam: Appears calm and comfortable  Respiratory system: Clear to auscultation. Respiratory effort normal. Cardiovascular system: S1 & S2 heard, RRR. No JVD, murmurs, rubs, gallops or clicks. No pedal edema. Gastrointestinal system: Abdomen is nondistended, soft and nontender. No organomegaly or masses felt. Normal bowel sounds heard. Central nervous system: Alert and oriented. No focal neurological deficits. Extremities: Symmetric 5 x 5 power. Skin: No rashes, lesions or ulcers Psychiatry: Judgement and insight appear normal. Mood & affect appropriate.    Condition at discharge: fair  The results of significant diagnostics from this hospitalization (including imaging, microbiology, ancillary and laboratory) are listed below for reference.   Imaging Studies: MR ABDOMEN MRCP W WO CONTAST  Result Date: 03/10/2022 CLINICAL DATA:  Gallstones, pneumobilia, no previous biliary procedures EXAM: MRI ABDOMEN WITHOUT AND WITH CONTRAST (INCLUDING MRCP) TECHNIQUE: Multiplanar multisequence MR imaging of the abdomen was performed both before and after the administration of intravenous  contrast. Heavily T2-weighted images of the biliary and pancreatic ducts were obtained, and three-dimensional MRCP images were rendered by post processing. CONTRAST:  9 mL Vueway gadolinium contrast IV COMPARISON:  CT abdomen pelvis, 03/09/2022 FINDINGS: Lower chest: Heterogeneous airspace opacity in the bilateral lung bases, assessment of the lung parenchyma limited on MR. Cardiomegaly. Large hiatal hernia. Hepatobiliary: No solid liver abnormality is seen. Mildly distended gallbladder containing a single gallstone. Mild gallbladder wall thickening (series 6, image 23, 20). No biliary ductal dilatation. Pancreas: Unremarkable. No pancreatic ductal dilatation or surrounding inflammatory changes. Spleen: Normal in size without significant abnormality. Adrenals/Urinary Tract: Adrenal glands are unremarkable. Simple, benign bilateral renal cortical cysts, for which no further follow-up or characterization is required. Kidneys are otherwise normal, without renal calculi, solid lesion, or hydronephrosis. Stomach/Bowel: Stomach is within normal limits. No evidence of bowel wall thickening, distention, or inflammatory changes. Vascular/Lymphatic: No significant vascular findings are present. No enlarged abdominal lymph nodes. Other: No abdominal wall hernia or abnormality. No ascites. Musculoskeletal: No acute or significant osseous findings. IMPRESSION: 1. Cholelithiasis with mild distention of the gallbladder and gallbladder wall thickening. No biliary ductal dilatation. Findings are suspicious for acute cholecystitis. 2. No evident pneumobilia on today's MR examination, however MR is limited in sensitivity for pneumobilia. 3. Heterogeneous airspace disease in the bilateral lung bases, as seen by prior CT, assessment limited by MR. 4. Large hiatal hernia. Electronically Signed   By: Delanna Ahmadi M.D.   On: 03/10/2022 13:34   MR 3D Recon At Scanner  Result Date: 03/10/2022 CLINICAL DATA:  Gallstones, pneumobilia, no  previous biliary procedures EXAM: MRI ABDOMEN WITHOUT AND WITH CONTRAST (INCLUDING MRCP) TECHNIQUE: Multiplanar multisequence MR imaging of the abdomen was performed both before and after the administration of intravenous contrast. Heavily T2-weighted images of the biliary and pancreatic ducts were obtained, and three-dimensional MRCP images were rendered by post processing. CONTRAST:  9 mL Vueway gadolinium contrast IV COMPARISON:  CT abdomen pelvis, 03/09/2022 FINDINGS: Lower chest: Heterogeneous airspace opacity in the bilateral lung bases, assessment of the lung parenchyma limited on MR. Cardiomegaly. Large hiatal hernia. Hepatobiliary: No solid liver abnormality is seen. Mildly distended gallbladder containing a single gallstone. Mild gallbladder wall thickening (series 6, image 23, 20). No biliary ductal dilatation. Pancreas: Unremarkable. No pancreatic ductal dilatation or surrounding inflammatory changes. Spleen: Normal in size without significant abnormality. Adrenals/Urinary Tract: Adrenal glands are unremarkable. Simple, benign bilateral renal cortical cysts, for which no further follow-up  or characterization is required. Kidneys are otherwise normal, without renal calculi, solid lesion, or hydronephrosis. Stomach/Bowel: Stomach is within normal limits. No evidence of bowel wall thickening, distention, or inflammatory changes. Vascular/Lymphatic: No significant vascular findings are present. No enlarged abdominal lymph nodes. Other: No abdominal wall hernia or abnormality. No ascites. Musculoskeletal: No acute or significant osseous findings. IMPRESSION: 1. Cholelithiasis with mild distention of the gallbladder and gallbladder wall thickening. No biliary ductal dilatation. Findings are suspicious for acute cholecystitis. 2. No evident pneumobilia on today's MR examination, however MR is limited in sensitivity for pneumobilia. 3. Heterogeneous airspace disease in the bilateral lung bases, as seen by prior  CT, assessment limited by MR. 4. Large hiatal hernia. Electronically Signed   By: Delanna Ahmadi M.D.   On: 03/10/2022 13:34   US RENAL  Result Date: 03/10/2022 CLINICAL DATA:  Acute renal failure. EXAM: RENAL / URINARY TRACT ULTRASOUND COMPLETE COMPARISON:  CT, 03/09/2022. FINDINGS: Right Kidney: Renal measurements: 9.7 x 4.8 x 5.1 cm = volume: 123.4 mL. Normal parenchymal echogenicity. 1.1 cm cyst, anterior midpole. No follow-up indicated. No other masses, no stones and no hydronephrosis. Left Kidney: Renal measurements: 9.7 x 4.6 x 4.5 cm = volume: 106.4 mL. Normal parenchymal echogenicity. Small upper pole cyst, 1 cm size, not well-defined on the previous day's CT. No follow-up recommended. No other renal masses, no stones and no hydronephrosis. Bladder: Appears normal for degree of bladder distention. Other: None. IMPRESSION: 1. No acute findings.  No hydronephrosis. 2. Small, single cysts in each kidney. Electronically Signed   By: Lajean Manes M.D.   On: 03/10/2022 12:01   US Abdomen Limited RUQ (LIVER/GB)  Result Date: 03/09/2022 CLINICAL DATA:  Right upper quadrant abdominal pain. EXAM: ULTRASOUND ABDOMEN LIMITED RIGHT UPPER QUADRANT COMPARISON:  February 28, 2014 FINDINGS: Gallbladder: Echogenic sludge is seen within the gallbladder lumen. Shadowing, echogenic gallstones are also seen. The largest measures approximately 1.1 cm. There is no evidence of gallbladder wall thickening (2.0 mm). No sonographic Murphy sign noted by sonographer. Common bile duct: Diameter: 7.1 mm Liver: The liver is enlarged (19 cm in length). No focal lesion identified. Within normal limits in parenchymal echogenicity. Portal vein is patent on color Doppler imaging with normal direction of blood flow towards the liver. Other: None. IMPRESSION: 1. Cholelithiasis and gallbladder sludge without acute cholecystitis. 2. Hepatomegaly without focal liver lesions. Electronically Signed   By: Virgina Norfolk M.D.   On:  03/09/2022 20:23   CT ABDOMEN PELVIS W CONTRAST  Result Date: 03/09/2022 CLINICAL DATA:  Bowel obstruction suspected, vomiting for 3 days, abdominal pain EXAM: CT ABDOMEN AND PELVIS WITH CONTRAST TECHNIQUE: Multidetector CT imaging of the abdomen and pelvis was performed using the standard protocol following bolus administration of intravenous contrast. RADIATION DOSE REDUCTION: This exam was performed according to the departmental dose-optimization program which includes automated exposure control, adjustment of the mA and/or kV according to patient size and/or use of iterative reconstruction technique. CONTRAST:  158m OMNIPAQUE IOHEXOL 300 MG/ML  SOLN COMPARISON:  None Available. FINDINGS: Lower chest: Heterogeneous and ground-glass airspace opacity throughout the lungs spaces, with underlying bandlike scarring and or atelectasis (series 6, image 5). Large, partially imaged hiatal hernia with intrathoracic position of the gastric body and fundus. Hepatobiliary: No solid liver abnormality is seen. Hepatic steatosis. Pneumobilia, presumably postprocedural. No gallstones, gallbladder wall thickening, or biliary dilatation. Pancreas: Unremarkable. No pancreatic ductal dilatation or surrounding inflammatory changes. Spleen: Normal in size without significant abnormality. Adrenals/Urinary Tract: Adrenal glands are unremarkable. Kidneys are  normal, without renal calculi, solid lesion, or hydronephrosis. Bladder is unremarkable. Stomach/Bowel: Stomach is within normal limits. Appendix is not clearly visualized and may be surgically absent. No evidence of bowel wall thickening, distention, or inflammatory changes. Sigmoid diverticulosis. Vascular/Lymphatic: Aortic atherosclerosis. No enlarged abdominal or pelvic lymph nodes. Reproductive: Status post hysterectomy. Other: No abdominal wall hernia or abnormality. No ascites. Musculoskeletal: No acute or significant osseous findings. IMPRESSION: 1. No acute CT findings  of the abdomen or pelvis to explain abdominal pain nausea, or vomiting. No evidence of bowel obstruction. 2. Heterogeneous and ground-glass airspace opacity throughout the lung bases, consistent with infection or aspiration. 3. Large, partially imaged hiatal hernia with intrathoracic position of the gastric body and fundus. 4. Hepatic steatosis. 5. Sigmoid diverticulosis without evidence of acute diverticulitis. Aortic Atherosclerosis (ICD10-I70.0). Electronically Signed   By: Delanna Ahmadi M.D.   On: 03/09/2022 19:04   DG Chest 2 View  Result Date: 02/15/2022 CLINICAL DATA:  Cough.  Crackles at left lung base. EXAM: CHEST - 2 VIEW COMPARISON:  Two-view chest x-ray a 05/26/2020 FINDINGS: Heart is enlarged. Left lower lobe airspace opacity is present. Changes of COPD are otherwise stable. Atherosclerotic changes are present at the aortic arch. Chronic degenerative changes are present in the distal right clavicle. IMPRESSION: 1. Left lower lobe airspace disease concerning for pneumonia. 2. Stable cardiomegaly without failure. 3. COPD. Electronically Signed   By: San Morelle M.D.   On: 02/15/2022 18:17    Microbiology: Results for orders placed or performed during the hospital encounter of 03/09/22  Resp Panel by RT-PCR (Flu A&B, Covid) Anterior Nasal Swab     Status: None   Collection Time: 03/09/22  7:38 PM   Specimen: Anterior Nasal Swab  Result Value Ref Range Status   SARS Coronavirus 2 by RT PCR NEGATIVE NEGATIVE Final    Comment: (NOTE) SARS-CoV-2 target nucleic acids are NOT DETECTED.  The SARS-CoV-2 RNA is generally detectable in upper respiratory specimens during the acute phase of infection. The lowest concentration of SARS-CoV-2 viral copies this assay can detect is 138 copies/mL. A negative result does not preclude SARS-Cov-2 infection and should not be used as the sole basis for treatment or other patient management decisions. A negative result may occur with  improper  specimen collection/handling, submission of specimen other than nasopharyngeal swab, presence of viral mutation(s) within the areas targeted by this assay, and inadequate number of viral copies(<138 copies/mL). A negative result must be combined with clinical observations, patient history, and epidemiological information. The expected result is Negative.  Fact Sheet for Patients:  EntrepreneurPulse.com.au  Fact Sheet for Healthcare Providers:  IncredibleEmployment.be  This test is no t yet approved or cleared by the [place] FDA and  has been authorized for detection and/or diagnosis of SARS-CoV-2 by FDA under an Emergency Use Authorization (EUA). This EUA will remain  in effect (meaning this test can be used) for the duration of the COVID-19 declaration under Section 564(b)(1) of the Act, 21 U.S.C.section 360bbb-3(b)(1), unless the authorization is terminated  or revoked sooner.       Influenza A by PCR NEGATIVE NEGATIVE Final   Influenza B by PCR NEGATIVE NEGATIVE Final    Comment: (NOTE) The Xpert Xpress SARS-CoV-2/FLU/RSV plus assay is intended as an aid in the diagnosis of influenza from Nasopharyngeal swab specimens and should not be used as a sole basis for treatment. Nasal washings and aspirates are unacceptable for Xpert Xpress SARS-CoV-2/FLU/RSV testing.  Fact Sheet for Patients: EntrepreneurPulse.com.au  Fact Sheet  for Healthcare Providers: IncredibleEmployment.be  This test is not yet approved or cleared by the [place] and has been authorized for detection and/or diagnosis of SARS-CoV-2 by FDA under an Emergency Use Authorization (EUA). This EUA will remain in effect (meaning this test can be used) for the duration of the COVID-19 declaration under Section 564(b)(1) of the Act, 21 U.S.C. section 360bbb-3(b)(1), unless the authorization is terminated or revoked.  Performed at  [facility], [place] [address], [place], [place] [address]     Labs: CBC: Recent Labs  Lab 03/11/22 [phone] 03/12/22 0433 03/13/22 0519 03/14/22 0446 03/15/22 0502  WBC 9.3 19.7* 8.5 6.5 8.4  NEUTROABS 7.5 17.9* 6.3 4.1 5.5  HGB 8.6* 9.3* 8.2* 8.4* 9.5*  HCT 30.2* 32.5* 28.3* 30.2* 32.8*  MCV 82.5 81.7 81.3 81.6 80.4  PLT 196 256 218 231 975   Basic Metabolic Panel: Recent Labs  Lab 03/11/22 0331 03/12/22 0433 03/13/22 0519 03/13/22 1039 03/14/22 0446 03/15/22 0502  NA 141 143 140  --  145 143  K 4.0 4.3 3.8  --  3.9 3.6  CL 113* 112* 113*  --  114* 109  CO2 '26 24 23  '$ --  26 27  GLUCOSE 102* 137* 119*  --  102* 104*  BUN '16 15 15  '$ --  12 12  CREATININE 1.04* 1.06* 1.08*  --  1.06* 1.01*  CALCIUM 10.8* 10.4* 10.5* 10.6* 11.7* 11.6*  MG 1.9 1.9 1.9  --  1.9 1.5*  PHOS 2.5 2.4* 2.3*  --  2.9 2.5   Liver Function Tests: Recent Labs  Lab 03/11/22 0331 03/12/22 0433 03/13/22 0519 03/14/22 0446 03/15/22 0502  AST 12* 22 15 11* 15  ALT '12 16 13 13 12  '$ ALKPHOS 68 81 68 69 74  BILITOT 0.7 0.5 0.4 0.3 0.3  PROT 6.3* 6.6 6.0* 6.0* 6.3*  ALBUMIN 2.9* 2.9* 2.7* 2.6* 2.8*   CBG: No results for input(s): "GLUCAP" in the last 168 hours.  Discharge time spent: 40 minutes.   Signed: Hosie Poisson, MD [facility] 03/15/2022

## 2022-03-15 NOTE — Progress Notes (Signed)
[facility] Progress Note  4 Days Post-Op  Subjective: CC-  Feeling better today. Abdomen a little sore but no pain. Mild whiffs of nausea at times but this is also improved. No emesis. Tolerating diet. Passing flatus, no BM.  Objective: Vital signs in last 24 hours: Temp:  [97.9 F (36.6 C)-98.2 F (36.8 C)] 97.9 F (36.6 C) (10/06 0519) Pulse Rate:  [51-59] 59 (10/06 0519) Resp:  [18] 18 (10/06 0519) BP: (152-182)/(83-96) 171/92 (10/06 0519) SpO2:  [87 %-95 %] 95 % (10/06 0519) Weight:  [91 kg] 91 kg (10/06 0449) Last BM Date : 03/14/22  Intake/Output from previous day: 10/05 0701 - 10/06 0700 In: 2567.7 [P.O.:960; I.V.:1607.7] Out: 3550 [Urine:3550] Intake/Output this shift: No intake/output data recorded.  PE: Gen:  Alert, NAD, pleasant Abd: soft, ND, mild tenderness at epigastric port, lap incisions cdi with mild surrounding ecchymosis  Lab Results:  Recent Labs    03/14/22 0446 03/15/22 0502  WBC 6.5 8.4  HGB 8.4* 9.5*  HCT 30.2* 32.8*  PLT 231 265   BMET Recent Labs    03/14/22 0446 03/15/22 0502  NA 145 143  K 3.9 3.6  CL 114* 109  CO2 26 27  GLUCOSE 102* 104*  BUN 12 12  CREATININE 1.06* 1.01*  CALCIUM 11.7* 11.6*   PT/INR No results for input(s): "LABPROT", "INR" in the last 72 hours. CMP     Component Value Date/Time   NA 143 03/15/2022 0502   K 3.6 03/15/2022 0502   CL 109 03/15/2022 0502   CO2 27 03/15/2022 0502   GLUCOSE 104 (H) 03/15/2022 0502   BUN 12 03/15/2022 0502   CREATININE 1.01 (H) 03/15/2022 0502   CREATININE 1.13 (H) 08/20/2019 1226   CALCIUM 11.6 (H) 03/15/2022 0502   CALCIUM 10.6 (H) 03/13/2022 1039   PROT 6.3 (L) 03/15/2022 0502   ALBUMIN 2.8 (L) 03/15/2022 0502   AST 15 03/15/2022 0502   AST 13 (L) 08/20/2019 1226   ALT 12 03/15/2022 0502   ALT 13 08/20/2019 1226   ALKPHOS 74 03/15/2022 0502   BILITOT 0.3 03/15/2022 0502   BILITOT 0.4 08/20/2019 1226   GFRNONAA 58 (L) 03/15/2022 0502   GFRNONAA  49 (L) 08/20/2019 1226   GFRAA >60 11/14/2019 1455   GFRAA 56 (L) 08/20/2019 1226   Lipase     Component Value Date/Time   LIPASE 26 03/09/2022 1646       Studies/Results: No results found.  Anti-infectives: Anti-infectives (From admission, onward)    Start     Dose/Rate Route Frequency Ordered Stop   03/10/22 1000  ceFEPIme (MAXIPIME) 2 g in sodium chloride 0.9 % 100 mL IVPB  Status:  Discontinued        2 g 200 mL/hr over 30 Minutes Intravenous Every 12 hours 03/09/22 2019 03/13/22 1338   03/09/22 2000  ceFEPIme (MAXIPIME) 2 g in sodium chloride 0.9 % 100 mL IVPB        2 g 200 mL/hr over 30 Minutes Intravenous  Once 03/09/22 1950 03/09/22 2306   03/09/22 1945  metroNIDAZOLE (FLAGYL) IVPB 500 mg  Status:  Discontinued        500 mg 100 mL/hr over 60 Minutes Intravenous Every 12 hours 03/09/22 1936 03/13/22 1338        Assessment/Plan Cholecystitis POD#4 s/p laparoscopic cholecystectomy 10/2 Dr. Kae Heller - Doing well from surgery. [place] for discharge from surgical standpoint. Discharge instructions and follow up info on AVS. I will send rx for pain medication  to her pharmacy.  We will sign off, please call with questions or concerns.   ID - maxipime/flagyl 9/30>>10/4 FEN - IVF per TRH, reg diet VTE - sq heparin Foley - none   Hyperparathyroidism - Ca still 11.6. PTH 58. Vitamin D pending. Discussed case with Dr. Harlow Asa who is reviewing. Our office will arrange follow up.  Pneumonia Hiatal hernia Glaucoma    LOS: 5 days    Wellington [place], [facility] 03/15/2022, 9:18 AM Please see Amion for pager number during day hours 7:00am-4:30pm

## 2022-03-15 NOTE — Progress Notes (Signed)
Physical Therapy Treatment Patient Details Name: Lynn Jenkins MRN: [id number] DOB: Jun 10, 1946 Today's Date: 03/15/2022   History of Present Illness 76 year old Hispanic female PMHx Generalized anxiety disorder,Primary, Lupus ([place]), Hyperparathyroidism, Mixed hyperlipidemia, glaucoma,Vitamin D deficiency , Left hand injury, right knee injury.    Presents to the ER 03/09/22  with a 3-day history of nausea, vomiting, abdominal pain, S/P LAPAROSCOPIC CHOLECYSTECTOMY on 03/11/22    PT Comments    Pt seen POD4, supine in bed reporting minimal pain (4/10) agreeable to be seen. Educated pt on logroll technique, handout provided, pt verbalized understanding but required min assist. Pt required min guard for transfers and ambulation in hallway with RW 441f, pt required one standing rest break after 2029f Encouraged pt to utilize RW at home, pace activities, and ensure she is wearing incontinence pad/briefs to avoid any additional falls, pt verbalized understanding. Educated pt about splinting of abdomen during movement/coughing/laughing/sneezing, pt demonstrated good form during mobility today. Discharge destination remains appropriate, we will continue to follow acutely.    Recommendations for follow up therapy are one component of a multi-disciplinary discharge planning process, led by the attending physician.  Recommendations may be updated based on patient status, additional functional criteria and insurance authorization.  Follow Up Recommendations  Home health PT     Assistance Recommended at Discharge PRN  Patient can return home with the following Assistance with cooking/housework;Assist for transportation   Equipment Recommendations  None recommended by PT    Recommendations for Other Services       Precautions / Restrictions Precautions Precautions: Fall Restrictions Weight Bearing Restrictions: No     Mobility  Bed Mobility Overal bed mobility: Needs Assistance Bed Mobility:  Supine to Sit, Rolling Rolling: Min assist   Supine to sit: Min assist     General bed mobility comments: Educated pt on logrolling to protected abdomen, pt required min assist for rolling and coming to upright (trunk elevation) on EOB.    Transfers Overall transfer level: Needs assistance Equipment used: Rolling walker (2 wheels) Transfers: Sit to/from Stand Sit to Stand: Min guard           General transfer comment: Pt guarding abdomen with hand, educated on using pillow as well. Min guard for safety, no physical assist requried.    Ambulation/Gait Ambulation/Gait assistance: Min guard Gait Distance (Feet): 400 Feet Assistive device: Rolling walker (2 wheels) Gait Pattern/deviations: Step-to pattern Gait velocity: decr     General Gait Details: Pt ambualted 40024fith min guard, no physical assist required or overt LOB noted. VCs for proximity to device.   Stairs             Wheelchair Mobility    Modified Rankin (Stroke Patients Only)       Balance Overall balance assessment: Mild deficits observed, not formally tested         Standing balance support: During functional activity, Reliant on assistive device for balance                                Cognition Arousal/Alertness: Awake/alert Behavior During Therapy: WFL for tasks assessed/performed Overall Cognitive Status: Within Functional Limits for tasks assessed                                          Exercises Other Exercises Other Exercises: Education on splinting of  abdomen, logrolling.    General Comments        Pertinent Vitals/Pain Pain Assessment Pain Assessment: 0-10 Pain Score: 4  Pain Location: right  lower abdoment Pain Descriptors / Indicators: Constant, Cramping, Grimacing, Guarding Pain Intervention(s): Limited activity within patient's tolerance, Monitored during session, Repositioned    Home Living                           Prior Function            PT Goals (current goals can now be found in the care plan section) Acute Rehab PT Goals Patient Stated Goal: I have to go home and take care of my spouse PT Goal Formulation: With patient Time For Goal Achievement: 03/26/22 Potential to Achieve Goals: Good Progress towards PT goals: Progressing toward goals    Frequency    Min 3X/week      PT Plan Current plan remains appropriate    Co-evaluation              AM-PAC PT "6 Clicks" Mobility   Outcome Measure  Help needed turning from your back to your side while in a flat bed without using bedrails?: A Little Help needed moving from lying on your back to sitting on the side of a flat bed without using bedrails?: A Little Help needed moving to and from a bed to a chair (including a wheelchair)?: A Little Help needed standing up from a chair using your arms (e.g., wheelchair or bedside chair)?: A Little Help needed to walk in hospital room?: A Little Help needed climbing 3-5 steps with a railing? : A Little 6 Click Score: 18    End of Session Equipment Utilized During Treatment: Gait belt Activity Tolerance: Patient tolerated treatment well;No increased pain Patient left: in chair;with call bell/phone within reach Nurse Communication: Mobility status PT Visit Diagnosis: Unsteadiness on feet (R26.81);Pain Pain - part of body:  (abdomen)     Time: 9326-7124 PT Time Calculation (min) (ACUTE ONLY): 22 min  Charges:  $Gait Training: 8-22 mins                     Coolidge Breeze, PT, DPT [facility] Department Office: [phone] Weekend pager: [phone]   Coolidge Breeze 03/15/2022, 12:18 PM

## 2022-03-16 DIAGNOSIS — Z79899 Other long term (current) drug therapy: Secondary | ICD-10-CM | POA: Diagnosis not present

## 2022-03-16 DIAGNOSIS — F411 Generalized anxiety disorder: Secondary | ICD-10-CM | POA: Diagnosis not present

## 2022-03-16 DIAGNOSIS — G43909 Migraine, unspecified, not intractable, without status migrainosus: Secondary | ICD-10-CM | POA: Diagnosis not present

## 2022-03-16 DIAGNOSIS — Z9049 Acquired absence of other specified parts of digestive tract: Secondary | ICD-10-CM | POA: Diagnosis not present

## 2022-03-16 DIAGNOSIS — H409 Unspecified glaucoma: Secondary | ICD-10-CM | POA: Diagnosis not present

## 2022-03-16 DIAGNOSIS — K810 Acute cholecystitis: Secondary | ICD-10-CM | POA: Diagnosis not present

## 2022-03-16 DIAGNOSIS — Z79891 Long term (current) use of opiate analgesic: Secondary | ICD-10-CM | POA: Diagnosis not present

## 2022-03-16 DIAGNOSIS — Z9181 History of falling: Secondary | ICD-10-CM | POA: Diagnosis not present

## 2022-03-16 DIAGNOSIS — Z48815 Encounter for surgical aftercare following surgery on the digestive system: Secondary | ICD-10-CM | POA: Diagnosis not present

## 2022-03-16 DIAGNOSIS — E039 Hypothyroidism, unspecified: Secondary | ICD-10-CM | POA: Diagnosis not present

## 2022-03-16 DIAGNOSIS — K449 Diaphragmatic hernia without obstruction or gangrene: Secondary | ICD-10-CM | POA: Diagnosis not present

## 2022-03-16 DIAGNOSIS — D509 Iron deficiency anemia, unspecified: Secondary | ICD-10-CM | POA: Diagnosis not present

## 2022-03-16 DIAGNOSIS — E210 Primary hyperparathyroidism: Secondary | ICD-10-CM | POA: Diagnosis not present

## 2022-03-16 DIAGNOSIS — E782 Mixed hyperlipidemia: Secondary | ICD-10-CM | POA: Diagnosis not present

## 2022-03-21 ENCOUNTER — Other Ambulatory Visit: Payer: Self-pay | Admitting: Surgery

## 2022-03-21 ENCOUNTER — Other Ambulatory Visit (HOSPITAL_COMMUNITY): Payer: Self-pay | Admitting: Surgery

## 2022-03-21 DIAGNOSIS — E213 Hyperparathyroidism, unspecified: Secondary | ICD-10-CM

## 2022-03-27 DIAGNOSIS — D649 Anemia, unspecified: Secondary | ICD-10-CM | POA: Diagnosis not present

## 2022-03-27 DIAGNOSIS — R945 Abnormal results of liver function studies: Secondary | ICD-10-CM | POA: Diagnosis not present

## 2022-03-27 DIAGNOSIS — R531 Weakness: Secondary | ICD-10-CM | POA: Diagnosis not present

## 2022-03-27 DIAGNOSIS — R2681 Unsteadiness on feet: Secondary | ICD-10-CM | POA: Diagnosis not present

## 2022-03-27 DIAGNOSIS — S9032XA Contusion of left foot, initial encounter: Secondary | ICD-10-CM | POA: Diagnosis not present

## 2022-03-27 DIAGNOSIS — K819 Cholecystitis, unspecified: Secondary | ICD-10-CM | POA: Diagnosis not present

## 2022-03-28 DIAGNOSIS — S9032XA Contusion of left foot, initial encounter: Secondary | ICD-10-CM | POA: Diagnosis not present

## 2022-03-29 ENCOUNTER — Encounter (HOSPITAL_COMMUNITY)
Admission: RE | Admit: 2022-03-29 | Discharge: 2022-03-29 | Disposition: A | Discharge status: Home / Self Care | Payer: Medicare Other | Source: Ambulatory Visit | Attending: Surgery | Admitting: Surgery

## 2022-03-29 ENCOUNTER — Emergency Department (HOSPITAL_COMMUNITY): Payer: Medicare Other

## 2022-03-29 ENCOUNTER — Emergency Department (HOSPITAL_COMMUNITY)
Admission: EM | Admit: 2022-03-29 | Discharge: 2022-03-30 | Disposition: A | Discharge status: Home / Self Care | Payer: Medicare Other | Attending: Emergency Medicine | Admitting: Emergency Medicine

## 2022-03-29 ENCOUNTER — Encounter (HOSPITAL_COMMUNITY): Payer: Self-pay

## 2022-03-29 DIAGNOSIS — W208XXA Other cause of strike by thrown, projected or falling object, initial encounter: Secondary | ICD-10-CM | POA: Diagnosis not present

## 2022-03-29 DIAGNOSIS — Z79899 Other long term (current) drug therapy: Secondary | ICD-10-CM | POA: Insufficient documentation

## 2022-03-29 DIAGNOSIS — E039 Hypothyroidism, unspecified: Secondary | ICD-10-CM | POA: Insufficient documentation

## 2022-03-29 DIAGNOSIS — M7732 Calcaneal spur, left foot: Secondary | ICD-10-CM | POA: Diagnosis not present

## 2022-03-29 DIAGNOSIS — E213 Hyperparathyroidism, unspecified: Secondary | ICD-10-CM | POA: Insufficient documentation

## 2022-03-29 DIAGNOSIS — S9032XA Contusion of left foot, initial encounter: Secondary | ICD-10-CM | POA: Insufficient documentation

## 2022-03-29 DIAGNOSIS — Z9889 Other specified postprocedural states: Secondary | ICD-10-CM | POA: Diagnosis not present

## 2022-03-29 DIAGNOSIS — S99922A Unspecified injury of left foot, initial encounter: Secondary | ICD-10-CM | POA: Diagnosis present

## 2022-03-29 LAB — CBC WITH DIFFERENTIAL/PLATELET
Abs Immature Granulocytes: 0.01 10*3/uL (ref 0.00–0.07)
Basophils Absolute: 0.1 10*3/uL (ref 0.0–0.1)
Basophils Relative: 1 %
Eosinophils Absolute: 0.2 10*3/uL (ref 0.0–0.5)
Eosinophils Relative: 3 %
HCT: 38.2 % (ref 36.0–46.0)
Hemoglobin: 11.2 g/dL — ABNORMAL LOW (ref 12.0–15.0)
Immature Granulocytes: 0 %
Lymphocytes Relative: 27 %
Lymphs Abs: 1.7 10*3/uL (ref 0.7–4.0)
MCH: 24.7 pg — ABNORMAL LOW (ref 26.0–34.0)
MCHC: 29.3 g/dL — ABNORMAL LOW (ref 30.0–36.0)
MCV: 84.1 fL (ref 80.0–100.0)
Monocytes Absolute: 0.5 10*3/uL (ref 0.1–1.0)
Monocytes Relative: 8 %
Neutro Abs: 3.8 10*3/uL (ref 1.7–7.7)
Neutrophils Relative %: 61 %
Platelets: 228 10*3/uL (ref 150–400)
RBC: 4.54 MIL/uL (ref 3.87–5.11)
RDW: 21.7 % — ABNORMAL HIGH (ref 11.5–15.5)
WBC: 6.2 10*3/uL (ref 4.0–10.5)
nRBC: 0 % (ref 0.0–0.2)

## 2022-03-29 LAB — BASIC METABOLIC PANEL
Anion gap: 5 (ref 5–15)
BUN: 14 mg/dL (ref 8–23)
CO2: 26 mmol/L (ref 22–32)
Calcium: 11.3 mg/dL — ABNORMAL HIGH (ref 8.9–10.3)
Chloride: 110 mmol/L (ref 98–111)
Creatinine, Ser: 1.08 mg/dL — ABNORMAL HIGH (ref 0.44–1.00)
GFR, Estimated: 54 mL/min — ABNORMAL LOW (ref >60)
Glucose, Bld: 86 mg/dL (ref 70–99)
Potassium: 4.2 mmol/L (ref 3.5–5.1)
Sodium: 141 mmol/L (ref 135–145)

## 2022-03-29 MED ORDER — TECHNETIUM TC 99M SESTAMIBI GENERIC - CARDIOLITE
26.5000 | Freq: Once | INTRAVENOUS | Status: AC
  Administered 2022-03-29: 26.5 via INTRAVENOUS

## 2022-03-29 MED ORDER — ACETAMINOPHEN 500 MG PO TABS
1000.0000 mg | ORAL_TABLET | Freq: Once | ORAL | Status: AC
  Administered 2022-03-30: 1000 mg via ORAL
  Filled 2022-03-29: qty 2

## 2022-03-29 NOTE — ED Triage Notes (Signed)
Pt arrived via POV, states dropped bottle of snapple on foot. Now pain and unable to stand.

## 2022-03-29 NOTE — Discharge Instructions (Addendum)
For pain control you may take at 1000 mg of Tylenol every 8 hours as needed.  You can also apply diclofenac ointment for additional pain control.

## 2022-03-29 NOTE — ED Provider Triage Note (Signed)
Emergency Medicine Provider Triage Evaluation Note  Lynn Jenkins , a 76 y.o. female  was evaluated in triage.  Pt complains of left foot pain and redness.  She states that she dropped a jar of apple sauce onto her foot 4 to 5 days ago.  There were no wounds or bleeding at that time.  She has had some difficulty bearing weight.  She has developed erythema over the top of her foot without fevers, prompting emergency department visit today.  Review of Systems  Positive: Foot pain, redness Negative: Fever  Physical Exam  BP (!) 127/91 (BP Location: Left Arm)   Pulse 63   Temp 98.7 F (37.1 C) (Oral)   Resp 18   SpO2 96%  Gen:   Awake, no distress   Resp:  Normal effort  MSK:   Moves extremities without difficulty  Other:  Some bruising of the proximal toes, erythema and mild warmth over the dorsum of the foot without lymphangitic spread up to the ankle or leg  Medical Decision Making  Medically screening exam initiated at 3:46 PM.  Appropriate orders placed.  Lynn Jenkins was informed that the remainder of the evaluation will be completed by another provider, this initial triage assessment does not replace that evaluation, and the importance of remaining in the ED until their evaluation is complete.     Carlisle Cater, PA-C 03/29/22 1547

## 2022-03-29 NOTE — ED Provider Notes (Signed)
[facility] DEPT Provider Note  CSN: [id number] Arrival date & time: 03/29/22 1508  Chief Complaint(s) Foot Pain  HPI Lynn Jenkins is a 76 y.o. female with a past medical history listed below including prior left foot surgery who presents to the emergency department for left foot pain after a 24 ounce bottle of Snapple fell onto it from the top shelf of her fridge.  This occurred 4 days ago.  Since then she has had gradually worsening pain, ecchymosis.  She developed some redness few days ago.  Her home health nurse recommended she be evaluated for possible infection.  Pain is mild to moderate aching worse with palpation and ambulation.  Alleviated by immobility and rest.  She has recurrent lower extremity edema has been ongoing for years that is unchanged.  She denies any other physical complaints  The history is provided by the patient.    Past Medical History Past Medical History:  Diagnosis Date   Anemia    Arthritis    Generalized anxiety disorder    GERD (gastroesophageal reflux disease)    Glaucoma    Headache    hx of migraines    History of hiatal hernia    Hyperthyroidism    Lupus ([facility])    Migraine    Mixed hyperlipidemia    Multinodular goiter    Osteopenia    Primary hyperparathyroidism ([place])    Varicose veins    Vitamin D deficiency    Patient Active Problem List   Diagnosis Date Noted   Iron deficiency anemia 03/12/2022   Obesity (BMI 30-39.9) 03/10/2022   Hypothyroidism 03/10/2022   Anemia, unspecified 03/10/2022   Migraine headache without aura 03/10/2022   Acute cholecystitis 03/09/2022   Glaucoma 04/02/2021   Leukocytosis 08/18/2019   Microcytic anemia 08/18/2019   Primary hyperparathyroidism ([place]) 11/01/2014   Hyperparathyroidism, primary ([place]) 10/31/2014   Varicose veins of lower extremities with other complications 42/70/6237   Home Medication(s) Prior to Admission medications   Medication Sig Start Date End Date  Taking? Authorizing Provider  acetaminophen (TYLENOL) 500 MG tablet Take 500-1,000 mg by mouth every 6 (six) hours as needed for mild pain or moderate pain.   Yes [provider]  cephALEXin (KEFLEX) 500 MG capsule Take 1 capsule (500 mg total) by mouth 3 (three) times daily for 10 days. 03/30/22 04/09/22 Yes Billiejean Schimek, Grayce Sessions, MD  cholecalciferol (VITAMIN D) 1000 UNITS tablet Take 1,000 Units by mouth daily.   Yes [provider]  cinacalcet (SENSIPAR) 30 MG tablet Take 1 tablet (30 mg total) by mouth daily with breakfast. 03/16/22  Yes Hosie Poisson, MD  co-enzyme Q-10 30 MG capsule Take 30 mg by mouth daily.   Yes [provider]  diclofenac Sodium (VOLTAREN ARTHRITIS PAIN) 1 % GEL Apply 4 g topically 4 (four) times daily. 03/30/22 04/29/22 Yes Irie Fiorello, Grayce Sessions, MD  esomeprazole (NEXIUM) 40 MG capsule Take 40 mg by mouth 2 (two) times daily before a meal.    Yes [provider]  ferrous sulfate 325 (65 FE) MG tablet Take 1 tablet (325 mg total) by mouth 2 (two) times daily with a meal. 03/15/22  Yes Hosie Poisson, MD  furosemide (LASIX) 20 MG tablet Take 20 mg by mouth daily. 08/26/18  Yes [provider]  Glucosamine-Chondroit-Vit C-Mn (GLUCOSAMINE 1500 COMPLEX) CAPS Take 1 capsule by mouth daily.   Yes [provider]  leflunomide (ARAVA) 10 MG tablet Take 10 mg by mouth daily.   Yes [provider]  levothyroxine (SYNTHROID) 88 MCG tablet Take 88 mcg by mouth daily. 03/06/22  Yes [provider]  metoCLOPramide (REGLAN) 10 MG tablet Take 1 tablet (10 mg total) by mouth every 6 (six) hours as needed for nausea or vomiting. Patient taking differently: Take 5-10 mg by mouth every 6 (six) hours as needed for nausea or vomiting. 11/14/19  Yes Hayden Rasmussen, MD  Misc Natural Products (TOTAL MEMORY & FOCUS FORMULA PO) Take 2 tablets by mouth daily.   Yes [provider]  Omega 3 1000 MG CAPS Take 2,000 mg by  mouth 2 (two) times daily.   Yes [provider]  sertraline (ZOLOFT) 100 MG tablet Take 100 mg by mouth daily.   Yes [provider]  SUMAtriptan (IMITREX) 100 MG tablet Take 100 mg by mouth every 2 (two) hours as needed for migraine or headache. 1 tab at onset of headache, may reoeat  In 2 hrs, max of 2 pills in 24 hrs 08/26/18  Yes [provider]  traMADol (ULTRAM) 50 MG tablet Take 1 tablet (50 mg total) by mouth every 6 (six) hours as needed for severe pain. 03/15/22  Yes Meuth, Brooke A, PA-C  vitamin B-12 (CYANOCOBALAMIN) 1000 MCG tablet Take 1,000 mcg by mouth daily.   Yes [provider]  vitamin C (ASCORBIC ACID) 500 MG tablet Take 500 mg by mouth daily as needed (when sick).    Yes [provider]  XALATAN 0.005 % ophthalmic solution Place 1 drop into the right eye at bedtime. 03/18/15  Yes [provider]                                                                                                                                    Allergies Doxycycline, Morphine and related, Zomig [zolmitriptan], Augmentin [amoxicillin-pot clavulanate], Ceftin [cefuroxime axetil], Gabapentin, Biaxin [clarithromycin], Cefdinir, and Sumatriptan  Review of Systems Review of Systems As noted in HPI  Physical Exam Vital Signs  I have reviewed the triage vital signs BP (!) 142/74 (BP Location: Left Arm)   Pulse 65   Temp 98.5 F (36.9 C) (Oral)   Resp 18   SpO2 95%   Physical Exam Vitals reviewed.  Constitutional:      General: She is not in acute distress.    Appearance: She is well-developed. She is not diaphoretic.  HENT:     Head: Normocephalic and atraumatic.     Right Ear: External ear normal.     Left Ear: External ear normal.     Nose: Nose normal.  Eyes:     General: No scleral icterus.    Conjunctiva/sclera: Conjunctivae normal.  Neck:     Trachea: Phonation normal.  Cardiovascular:     Rate and Rhythm: Normal rate and  regular rhythm.  Pulmonary:     Effort: Pulmonary effort is normal. No respiratory distress.     Breath sounds: No stridor.  Abdominal:     General: There is no distension.  Musculoskeletal:        General: Normal range of motion.     Cervical back: Normal range of motion.       Feet:  Feet:     Left foot:     Skin integrity: Fissure (in the web spaces b/w toes) present.     Comments: Numerous surgical scars to foot. Deformity from surgeries. Neurological:     Mental Status: She is alert and oriented to person, place, and time.  Psychiatric:        Behavior: Behavior normal.     ED Results and Treatments Labs (all labs ordered are listed, but only abnormal results are displayed) Labs Reviewed  CBC WITH DIFFERENTIAL/PLATELET - Abnormal; Notable for the following components:      Result Value   Hemoglobin 11.2 (*)    MCH 24.7 (*)    MCHC 29.3 (*)    RDW 21.7 (*)    All other components within normal limits  BASIC METABOLIC PANEL - Abnormal; Notable for the following components:   Creatinine, Ser 1.08 (*)    Calcium 11.3 (*)    GFR, Estimated 54 (*)    All other components within normal limits                                                                                                                         EKG  EKG Interpretation  Date/Time:    Ventricular Rate:    PR Interval:    QRS Duration:   QT Interval:    QTC Calculation:   R Axis:     Text Interpretation:         Radiology DG Foot Complete Left  Result Date: 03/29/2022 CLINICAL DATA:  Doppler joint foot 5 days ago.  Increasing redness. EXAM: LEFT FOOT - COMPLETE 3+ VIEW COMPARISON:  Left foot radiographs 02/03/2015 FINDINGS: There is diffuse decreased bone mineralization. Redemonstration of partial resection of the articular surfaces of the second and third metatarsophalangeal joints and likely the first metatarsophalangeal joint. There is associated articular surface irregularity, greatest within  the great toe metatarsophalangeal joint, unchanged from prior. Hit cerclage wire again overlies the distal second metatarsal. Additional chronic bone loss within the proximal shaft of the proximal phalanx of the fourth and fifth toes, unchanged. Moderate interphalangeal joint space narrowing diffusely. Small plantar calcaneal heel spur. No acute fracture is seen.  No dislocation. IMPRESSION: 1. No acute fracture. 2. Chronic postsurgical changes of partial resection of the second and third metatarsophalangeal joints and likely the first metatarsophalangeal joint. No significant change from prior. Electronically Signed   By: Yvonne Kendall M.D.   On: 03/29/2022 16:28    Medications Ordered in ED Medications  acetaminophen (TYLENOL) tablet 1,000 mg (has no administration in time range)  Procedures Procedures  (including critical care time)  Medical Decision Making / ED Course   Medical Decision Making Amount and/or Complexity of Data Reviewed Labs: ordered. Decision-making details documented in ED Course. Radiology: ordered and independent interpretation performed. Decision-making details documented in ED Course.  Risk OTC drugs.    Patient presents with left foot pain. Evidence of ecchymosis.  Likely contusion.  Given patient's surgical history of the left foot, plain film was obtained and did not show evidence of acute fracture. She does have hyperemia and contusion on the dorsum of the foot but does not seem to streak up from the fissures in the webspaces of the toes.  Feel this is likely due to the underlying hematoma and less likely cellulitis.  CBC does not show leukocytosis.  Mild anemia. Metabolic panel with hypercalcemia which is baseline for the patient.  No other electrolyte derangements.  Mild renal insufficiency without AKI.  Patient provided with  Tylenol.  Recommended close monitoring of the redness will prescribe a delayed course of antibiotics in case the redness expands.      Final Clinical Impression(s) / ED Diagnoses Final diagnoses:  Contusion of left foot, initial encounter   The patient appears reasonably screened and/or stabilized for discharge and I doubt any other medical condition or other [facility] requiring further screening, evaluation, or treatment in the ED at this time. I have discussed the findings, Dx and Tx plan with the patient/family who expressed understanding and agree(s) with the plan. Discharge instructions discussed at length. The patient/family was given strict return precautions who verbalized understanding of the instructions. No further questions at time of discharge.  Disposition: Discharge  Condition: Good  ED Discharge Orders          Ordered    cephALEXin (KEFLEX) 500 MG capsule  3 times daily        03/30/22 0001    diclofenac Sodium (VOLTAREN ARTHRITIS PAIN) 1 % GEL  4 times daily        03/30/22 0001             Follow Up: Lawerance Cruel, MD [place] [address] [phone]  Call             This chart was dictated using voice recognition software.  Despite best efforts to proofread,  errors can occur which can change the documentation meaning.    Fatima Blank, MD 03/30/22 0002

## 2022-03-29 NOTE — ED Notes (Signed)
Pt had an IV from CT in R forearm. IV was removed by this tech due to bruising and swelling

## 2022-03-30 MED ORDER — CEPHALEXIN 500 MG PO CAPS: 500.0000 mg | ORAL_CAPSULE | Freq: Three times a day (TID) | ORAL | 0 refills | Status: AC

## 2022-03-30 MED ORDER — DICLOFENAC SODIUM 1 % EX GEL: 4.0000 g | Freq: Four times a day (QID) | CUTANEOUS | 0 refills | Status: AC

## 2022-04-03 DIAGNOSIS — E210 Primary hyperparathyroidism: Secondary | ICD-10-CM | POA: Diagnosis not present

## 2022-04-03 DIAGNOSIS — E892 Postprocedural hypoparathyroidism: Secondary | ICD-10-CM | POA: Diagnosis not present

## 2022-04-03 DIAGNOSIS — Z9009 Acquired absence of other part of head and neck: Secondary | ICD-10-CM | POA: Diagnosis not present

## 2022-04-03 NOTE — Progress Notes (Signed)
Sestamibi scan now with positive uptake at right inferior position.  Will need to discuss with patient whether to have another surgical procedure versus further imaging.  Will discuss in office.  [place], MD [facility] [place] practice Office: [phone]

## 2022-04-05 ENCOUNTER — Other Ambulatory Visit: Payer: Self-pay | Admitting: Podiatry

## 2022-04-05 ENCOUNTER — Encounter: Payer: Self-pay | Admitting: Podiatry

## 2022-04-05 ENCOUNTER — Ambulatory Visit (INDEPENDENT_AMBULATORY_CARE_PROVIDER_SITE_OTHER): Payer: Medicare Other

## 2022-04-05 ENCOUNTER — Ambulatory Visit: Payer: Medicare Other | Admitting: Podiatry

## 2022-04-05 DIAGNOSIS — M778 Other enthesopathies, not elsewhere classified: Secondary | ICD-10-CM

## 2022-04-05 DIAGNOSIS — S9030XA Contusion of unspecified foot, initial encounter: Secondary | ICD-10-CM | POA: Diagnosis not present

## 2022-04-05 DIAGNOSIS — S9032XA Contusion of left foot, initial encounter: Secondary | ICD-10-CM

## 2022-04-05 MED ORDER — TRAMADOL HCL 50 MG PO TABS: 50.0000 mg | ORAL_TABLET | Freq: Four times a day (QID) | ORAL | 0 refills | Status: DC | PRN

## 2022-04-08 ENCOUNTER — Other Ambulatory Visit: Payer: Self-pay | Admitting: Surgery

## 2022-04-08 DIAGNOSIS — Z9009 Acquired absence of other part of head and neck: Secondary | ICD-10-CM

## 2022-04-08 DIAGNOSIS — E210 Primary hyperparathyroidism: Secondary | ICD-10-CM

## 2022-04-08 DIAGNOSIS — E890 Postprocedural hypothyroidism: Secondary | ICD-10-CM

## 2022-04-09 ENCOUNTER — Ambulatory Visit
Admission: RE | Admit: 2022-04-09 | Discharge: 2022-04-09 | Disposition: A | Discharge status: Home / Self Care | Payer: Medicare Other | Source: Ambulatory Visit | Attending: Surgery | Admitting: Surgery

## 2022-04-09 DIAGNOSIS — E890 Postprocedural hypothyroidism: Secondary | ICD-10-CM

## 2022-04-09 DIAGNOSIS — Z9009 Acquired absence of other part of head and neck: Secondary | ICD-10-CM

## 2022-04-09 DIAGNOSIS — E210 Primary hyperparathyroidism: Secondary | ICD-10-CM

## 2022-04-09 DIAGNOSIS — E213 Hyperparathyroidism, unspecified: Secondary | ICD-10-CM | POA: Diagnosis not present

## 2022-04-09 DIAGNOSIS — E042 Nontoxic multinodular goiter: Secondary | ICD-10-CM | POA: Diagnosis not present

## 2022-04-09 NOTE — Progress Notes (Signed)
Subjective:   Patient ID: Lynn Jenkins, female   DOB: 76 y.o.   MRN: [id number]   HPI Chief Complaint  Patient presents with   Foot Injury    Dorsal midfoot left - a bottle of juice fell out of the fridge 2.5 weeks ago (DOI: 03/27/22), very red and swollen, extremely painful, tried ice, ER recommended diclofenac gel-makes burn, Rx'd cephalexin, but never started (GI upset)   New Patient (Initial Visit)    76 year old female presents the office today with above concerns.  She is 2 weeks ago a bottle of juice fell on the top of her left foot.  Since then been swollen and red.  She is going to the emergency room was told to use diclofenac gel but it burned.  She is.  Cephalexin but she did not take this.  She is wearing regular shoes.   Review of Systems  All other systems reviewed and are negative.  Past Medical History:  Diagnosis Date   Anemia    Arthritis    Generalized anxiety disorder    GERD (gastroesophageal reflux disease)    Glaucoma    Headache    hx of migraines    History of hiatal hernia    Hyperthyroidism    Lupus (HCC)    Migraine    Mixed hyperlipidemia    Multinodular goiter    Osteopenia    Primary hyperparathyroidism ([place])    Varicose veins    Vitamin D deficiency     Past Surgical History:  Procedure Laterality Date   ABDOMINAL HYSTERECTOMY     APPENDECTOMY     CHOLECYSTECTOMY N/A 03/11/2022   Procedure: LAPAROSCOPIC CHOLECYSTECTOMY WITH icg dye;  Surgeon: Clovis Riley, MD;  Location: [facility];  Service: General;  Laterality: N/A;   JOINT REPLACEMENT     left foot surgery      PARATHYROIDECTOMY N/A 11/01/2014   Procedure: NECK EXPLORATION AND PARATHYROIDECTOMY;  Surgeon: Armandina Gemma, MD;  Location: [facility];  Service: General;  Laterality: N/A;   PARATHYROIDECTOMY N/A 10/31/2020   Procedure: PARATHYROIDECTOMY WITH LEFT THYROID LOBECTOMY;  Surgeon: Armandina Gemma, MD;  Location: [facility];  Service: General;  Laterality: N/A;   SHOULDER SURGERY        Current Outpatient Medications:    acetaminophen (TYLENOL) 500 MG tablet, Take 500-1,000 mg by mouth every 6 (six) hours as needed for mild pain or moderate pain., Disp: , Rfl:    cephALEXin (KEFLEX) 500 MG capsule, Take 1 capsule (500 mg total) by mouth 3 (three) times daily for 10 days., Disp: 30 capsule, Rfl: 0   cholecalciferol (VITAMIN D) 1000 UNITS tablet, Take 1,000 Units by mouth daily., Disp: , Rfl:    cinacalcet (SENSIPAR) 30 MG tablet, Take 1 tablet (30 mg total) by mouth daily with breakfast., Disp: 30 tablet, Rfl: 0   co-enzyme Q-10 30 MG capsule, Take 30 mg by mouth daily., Disp: , Rfl:    diclofenac Sodium (VOLTAREN ARTHRITIS PAIN) 1 % GEL, Apply 4 g topically 4 (four) times daily., Disp: 500 g, Rfl: 0   esomeprazole (NEXIUM) 40 MG capsule, Take 40 mg by mouth 2 (two) times daily before a meal. , Disp: , Rfl:    ferrous sulfate 325 (65 FE) MG tablet, Take 1 tablet (325 mg total) by mouth 2 (two) times daily with a meal., Disp: 60 tablet, Rfl: 3   furosemide (LASIX) 20 MG tablet, Take 20 mg by mouth daily., Disp: , Rfl:    Glucosamine-Chondroit-Vit C-Mn (GLUCOSAMINE 1500  COMPLEX) CAPS, Take 1 capsule by mouth daily., Disp: , Rfl:    leflunomide (ARAVA) 10 MG tablet, Take 10 mg by mouth daily., Disp: , Rfl:    levothyroxine (SYNTHROID) 88 MCG tablet, Take 88 mcg by mouth daily., Disp: , Rfl:    metoCLOPramide (REGLAN) 10 MG tablet, Take 1 tablet (10 mg total) by mouth every 6 (six) hours as needed for nausea or vomiting. (Patient taking differently: Take 5-10 mg by mouth every 6 (six) hours as needed for nausea or vomiting.), Disp: 30 tablet, Rfl: 0   Misc Natural Products (TOTAL MEMORY & FOCUS FORMULA PO), Take 2 tablets by mouth daily., Disp: , Rfl:    Omega 3 1000 MG CAPS, Take 2,000 mg by mouth 2 (two) times daily., Disp: , Rfl:    sertraline (ZOLOFT) 100 MG tablet, Take 100 mg by mouth daily., Disp: , Rfl:    SUMAtriptan (IMITREX) 100 MG tablet, Take 100 mg by mouth every 2  (two) hours as needed for migraine or headache. 1 tab at onset of headache, may reoeat  In 2 hrs, max of 2 pills in 24 hrs, Disp: , Rfl:    traMADol (ULTRAM) 50 MG tablet, Take 1 tablet (50 mg total) by mouth every 6 (six) hours as needed for severe pain., Disp: 10 tablet, Rfl: 0   vitamin B-12 (CYANOCOBALAMIN) 1000 MCG tablet, Take 1,000 mcg by mouth daily., Disp: , Rfl:    vitamin C (ASCORBIC ACID) 500 MG tablet, Take 500 mg by mouth daily as needed (when sick). , Disp: , Rfl:    XALATAN 0.005 % ophthalmic solution, Place 1 drop into the right eye at bedtime., Disp: , Rfl: 4  Allergies  Allergen Reactions   Doxycycline Swelling    Facial swelling, itching   Morphine And Related Swelling   Other Swelling   Zomig [Zolmitriptan] Nausea And Vomiting    Increased heart rate    Augmentin [Amoxicillin-Pot Clavulanate] Diarrhea   Ceftin [Cefuroxime Axetil] Other (See Comments)    Stomach ache   Gabapentin Other (See Comments)    Confusion, off balance   Methazolamide Diarrhea   Biaxin [Clarithromycin] Rash   Cefdinir Rash   Sumatriptan Other (See Comments)    Can use brand Imitrex          Objective:  Physical Exam  General: AAO x3, NAD  Dermatological: There is localized edema and reportedly dorsal aspect of foot appears to be hematoma present.  There is no crepitation.  No open lesions.  No drainage.  Vascular: Dorsalis Pedis artery and Posterior Tibial artery pedal pulses are 2/4 bilateral with immedate capillary fill time. There is no pain with calf compression, swelling, warmth, erythema.   Neruologic: Grossly intact via light touch bilateral.   Musculoskeletal: Tenderness palpation on the dorsal aspect midfoot along the area of the likely hematoma.  Flexor, extensor tendons appear to be intact but difficult to fully evaluate if she is guarding given pain.  Muscular strength 5/5 in all groups tested bilateral.  Gait: Unassisted, Nonantalgic.       Assessment:    76 year old female with contusion, hematoma left foot     Plan:  -Treatment options discussed including all alternatives, risks, and complications -Etiology of symptoms were discussed -X-rays were obtained and reviewed with the patient.  3 views left foot were obtained.  Not able to appreciate any evidence of acute fracture.  History of prior surgeries noted on the first, second and third metatarsophalangeal joints. -Dispensed surgical shoe for mobilization  to avoid pressure.  Recommended warm compress over the area along the hematoma.  Recommend antibiotics that she has.  Rest ice, elevate limit activity.  If no improvement consider trying to drain this soft tissue mass which is likely hematoma. -Monitor for any clinical signs or symptoms of infection and directed to call the office immediately should any occur or go to the ER.   Trula Slade DPM

## 2022-04-11 ENCOUNTER — Telehealth: Payer: Self-pay | Admitting: Podiatry

## 2022-04-11 NOTE — Telephone Encounter (Signed)
I spoke with Lynn Jenkins and I was wondering how long she will be out of work?

## 2022-04-15 DIAGNOSIS — E782 Mixed hyperlipidemia: Secondary | ICD-10-CM | POA: Diagnosis not present

## 2022-04-15 DIAGNOSIS — E210 Primary hyperparathyroidism: Secondary | ICD-10-CM | POA: Diagnosis not present

## 2022-04-15 DIAGNOSIS — Z9181 History of falling: Secondary | ICD-10-CM | POA: Diagnosis not present

## 2022-04-15 DIAGNOSIS — K449 Diaphragmatic hernia without obstruction or gangrene: Secondary | ICD-10-CM | POA: Diagnosis not present

## 2022-04-15 DIAGNOSIS — Z48815 Encounter for surgical aftercare following surgery on the digestive system: Secondary | ICD-10-CM | POA: Diagnosis not present

## 2022-04-15 DIAGNOSIS — F411 Generalized anxiety disorder: Secondary | ICD-10-CM | POA: Diagnosis not present

## 2022-04-15 DIAGNOSIS — Z79899 Other long term (current) drug therapy: Secondary | ICD-10-CM | POA: Diagnosis not present

## 2022-04-15 DIAGNOSIS — D509 Iron deficiency anemia, unspecified: Secondary | ICD-10-CM | POA: Diagnosis not present

## 2022-04-15 DIAGNOSIS — H409 Unspecified glaucoma: Secondary | ICD-10-CM | POA: Diagnosis not present

## 2022-04-15 DIAGNOSIS — E039 Hypothyroidism, unspecified: Secondary | ICD-10-CM | POA: Diagnosis not present

## 2022-04-15 DIAGNOSIS — Z9049 Acquired absence of other specified parts of digestive tract: Secondary | ICD-10-CM | POA: Diagnosis not present

## 2022-04-15 DIAGNOSIS — G43909 Migraine, unspecified, not intractable, without status migrainosus: Secondary | ICD-10-CM | POA: Diagnosis not present

## 2022-04-15 DIAGNOSIS — Z79891 Long term (current) use of opiate analgesic: Secondary | ICD-10-CM | POA: Diagnosis not present

## 2022-04-15 DIAGNOSIS — K810 Acute cholecystitis: Secondary | ICD-10-CM | POA: Diagnosis not present

## 2022-04-22 ENCOUNTER — Ambulatory Visit: Payer: Medicare Other | Admitting: Podiatry

## 2022-04-22 ENCOUNTER — Encounter: Payer: Self-pay | Admitting: Podiatry

## 2022-04-22 ENCOUNTER — Ambulatory Visit (INDEPENDENT_AMBULATORY_CARE_PROVIDER_SITE_OTHER): Payer: Medicare Other

## 2022-04-22 DIAGNOSIS — S9032XA Contusion of left foot, initial encounter: Secondary | ICD-10-CM

## 2022-04-22 DIAGNOSIS — M79672 Pain in left foot: Secondary | ICD-10-CM

## 2022-04-22 DIAGNOSIS — S9030XA Contusion of unspecified foot, initial encounter: Secondary | ICD-10-CM

## 2022-04-22 MED ORDER — AMOXICILLIN-POT CLAVULANATE 875-125 MG PO TABS: 1.0000 | ORAL_TABLET | Freq: Two times a day (BID) | ORAL | 0 refills | Status: DC

## 2022-04-23 DIAGNOSIS — R2681 Unsteadiness on feet: Secondary | ICD-10-CM | POA: Diagnosis not present

## 2022-04-25 NOTE — Progress Notes (Signed)
Subjective: Chief Complaint  Patient presents with   Foot Pain    Left foot pain, dorsal midfoot, some swelling, Rat of pain 8 out of 10, throbbing, pain with wearing shoes,  Patient never started the keflex asked to send something else to the pharmacy    76 year old female presents the office today with above concerns.  She said that she still getting pain and swelling on the midfoot however does appear to be much improved.  She never started the Keflex because taking because excessive GI issues and she does not want to take this.  She still in surgical shoe.  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  Objective: AAO x3, NAD DP/PT pulses palpable bilaterally, CRT less than 3 seconds There is still edema present on the left midfoot however area of edema and erythema, likely hematoma is to be substantially improved.  There is still tenderness palpation of this area as well as the dorsal forefoot on the metatarsal heads.  Flexor, extensor tendons.  Intact.  No pain with calf compression, swelling, warmth, erythema  Assessment: 76 year old female contusion left foot, resolving hematoma  Plan: -All treatment options discussed with the patient including all alternatives, risks, complications.  -Repeat x-rays were obtained reviewed.  3 views of the left foot were obtained.  No evidence of acute fracture noted today.  Multiple digital fomites and changes of metatarsal heads noted likely chronic given her previous surgeries -Certainly some residual edema erythema likely from contusion however infection.  Prescribed Augmentin.  She said that she cannot take this although she has an allergy documented. -Continue surgical shoe, ice, elevation, compression -She can transition to regular shoe as tolerated -But no improvement recommended advanced imaging -Patient encouraged to call the office with any questions, concerns, change  in symptoms.   Trula Slade DPM

## 2022-05-01 ENCOUNTER — Encounter: Payer: Self-pay | Admitting: Podiatry

## 2022-05-06 ENCOUNTER — Ambulatory Visit (INDEPENDENT_AMBULATORY_CARE_PROVIDER_SITE_OTHER): Payer: Medicare Other | Admitting: Podiatry

## 2022-05-06 ENCOUNTER — Other Ambulatory Visit: Payer: Self-pay | Admitting: Podiatry

## 2022-05-06 DIAGNOSIS — S9032XA Contusion of left foot, initial encounter: Secondary | ICD-10-CM

## 2022-05-06 DIAGNOSIS — S9030XA Contusion of unspecified foot, initial encounter: Secondary | ICD-10-CM

## 2022-05-06 DIAGNOSIS — M79672 Pain in left foot: Secondary | ICD-10-CM

## 2022-05-06 DIAGNOSIS — M7742 Metatarsalgia, left foot: Secondary | ICD-10-CM

## 2022-05-07 ENCOUNTER — Other Ambulatory Visit: Payer: Medicare Other

## 2022-05-07 ENCOUNTER — Ambulatory Visit: Payer: Medicare Other | Admitting: Podiatry

## 2022-05-07 NOTE — Progress Notes (Signed)
USN shows a 1.5 cm nodule at the inferior pole of the right thyroid lobe which appears to correspond to the uptake on the sestamibi scan.  This could be an intra-thyroidal parathyroid gland.  If the patient is willing, we could order a FNA biopsy and send it for [facility] testing to see if this was parathyroid tissue.  I called patient this morning and LMOM.  Claiborne Billings - please follow up with patient.  If willing to undergo biopsy, will discuss specific orders for handling of specimen with radiology and pathology.  [place], MD [facility] [place] [facility] Office: [phone]

## 2022-05-10 DIAGNOSIS — M79672 Pain in left foot: Secondary | ICD-10-CM | POA: Diagnosis not present

## 2022-05-10 DIAGNOSIS — K9189 Other postprocedural complications and disorders of digestive system: Secondary | ICD-10-CM | POA: Diagnosis not present

## 2022-05-10 DIAGNOSIS — K122 Cellulitis and abscess of mouth: Secondary | ICD-10-CM | POA: Diagnosis not present

## 2022-05-10 DIAGNOSIS — L03116 Cellulitis of left lower limb: Secondary | ICD-10-CM | POA: Diagnosis not present

## 2022-05-11 NOTE — Progress Notes (Signed)
Patient presented today as a walk-in for me to add a metatarsal pad for her shoe.  Her symptoms have much improved and she was back to work as well.  Upon evaluation swelling to her foot is much improved and is no erythema or warmth.  There is no pain on exam.  There is a hematoma has resolved.  No erythema warmth or any signs of infection.  She has a history of multiple surgeries on her foot departments metatarsal heads.  I added a metatarsal pad to wear shoe.  Upon walking this was more comfortable for her.  Will continue this treatment but should there be any recurrence or increased pain and swelling or any changes to let me know immediately.  Trula Slade DPM

## 2022-05-15 ENCOUNTER — Other Ambulatory Visit: Payer: Self-pay | Admitting: Surgery

## 2022-05-15 DIAGNOSIS — E041 Nontoxic single thyroid nodule: Secondary | ICD-10-CM

## 2022-05-22 DIAGNOSIS — H35352 Cystoid macular degeneration, left eye: Secondary | ICD-10-CM | POA: Diagnosis not present

## 2022-05-22 DIAGNOSIS — H18513 Endothelial corneal dystrophy, bilateral: Secondary | ICD-10-CM | POA: Diagnosis not present

## 2022-05-22 DIAGNOSIS — H401123 Primary open-angle glaucoma, left eye, severe stage: Secondary | ICD-10-CM | POA: Diagnosis not present

## 2022-05-22 DIAGNOSIS — H401112 Primary open-angle glaucoma, right eye, moderate stage: Secondary | ICD-10-CM | POA: Diagnosis not present

## 2022-07-18 DIAGNOSIS — M1711 Unilateral primary osteoarthritis, right knee: Secondary | ICD-10-CM | POA: Diagnosis not present

## 2022-08-14 ENCOUNTER — Inpatient Hospital Stay (HOSPITAL_COMMUNITY)
Admission: EM | Admit: 2022-08-14 | Discharge: 2022-08-17 | DRG: 392 | Disposition: A | Discharge status: Home / Self Care | Payer: Medicare Other | Attending: Internal Medicine | Admitting: Internal Medicine

## 2022-08-14 ENCOUNTER — Encounter (HOSPITAL_COMMUNITY): Payer: Self-pay | Admitting: Family Medicine

## 2022-08-14 ENCOUNTER — Emergency Department (HOSPITAL_COMMUNITY): Payer: Medicare Other

## 2022-08-14 ENCOUNTER — Other Ambulatory Visit: Payer: Self-pay

## 2022-08-14 ENCOUNTER — Encounter: Payer: Self-pay | Admitting: Hematology

## 2022-08-14 DIAGNOSIS — H409 Unspecified glaucoma: Secondary | ICD-10-CM | POA: Diagnosis not present

## 2022-08-14 DIAGNOSIS — M064 Inflammatory polyarthropathy: Secondary | ICD-10-CM | POA: Diagnosis present

## 2022-08-14 DIAGNOSIS — N281 Cyst of kidney, acquired: Secondary | ICD-10-CM | POA: Diagnosis not present

## 2022-08-14 DIAGNOSIS — E782 Mixed hyperlipidemia: Secondary | ICD-10-CM | POA: Diagnosis present

## 2022-08-14 DIAGNOSIS — D649 Anemia, unspecified: Secondary | ICD-10-CM | POA: Diagnosis present

## 2022-08-14 DIAGNOSIS — Z8719 Personal history of other diseases of the digestive system: Secondary | ICD-10-CM

## 2022-08-14 DIAGNOSIS — Z881 Allergy status to other antibiotic agents status: Secondary | ICD-10-CM

## 2022-08-14 DIAGNOSIS — K5730 Diverticulosis of large intestine without perforation or abscess without bleeding: Secondary | ICD-10-CM | POA: Diagnosis not present

## 2022-08-14 DIAGNOSIS — R112 Nausea with vomiting, unspecified: Secondary | ICD-10-CM | POA: Diagnosis not present

## 2022-08-14 DIAGNOSIS — K219 Gastro-esophageal reflux disease without esophagitis: Secondary | ICD-10-CM | POA: Diagnosis present

## 2022-08-14 DIAGNOSIS — K449 Diaphragmatic hernia without obstruction or gangrene: Principal | ICD-10-CM | POA: Diagnosis present

## 2022-08-14 DIAGNOSIS — M329 Systemic lupus erythematosus, unspecified: Secondary | ICD-10-CM | POA: Diagnosis present

## 2022-08-14 DIAGNOSIS — Z8249 Family history of ischemic heart disease and other diseases of the circulatory system: Secondary | ICD-10-CM | POA: Diagnosis not present

## 2022-08-14 DIAGNOSIS — M1380 Other specified arthritis, unspecified site: Secondary | ICD-10-CM | POA: Diagnosis present

## 2022-08-14 DIAGNOSIS — Z79890 Hormone replacement therapy (postmenopausal): Secondary | ICD-10-CM | POA: Diagnosis not present

## 2022-08-14 DIAGNOSIS — G43009 Migraine without aura, not intractable, without status migrainosus: Secondary | ICD-10-CM | POA: Diagnosis present

## 2022-08-14 DIAGNOSIS — Q401 Congenital hiatus hernia: Secondary | ICD-10-CM | POA: Diagnosis not present

## 2022-08-14 DIAGNOSIS — R0789 Other chest pain: Secondary | ICD-10-CM | POA: Diagnosis not present

## 2022-08-14 DIAGNOSIS — Z79899 Other long term (current) drug therapy: Secondary | ICD-10-CM

## 2022-08-14 DIAGNOSIS — Z90710 Acquired absence of both cervix and uterus: Secondary | ICD-10-CM

## 2022-08-14 DIAGNOSIS — Z885 Allergy status to narcotic agent status: Secondary | ICD-10-CM

## 2022-08-14 DIAGNOSIS — M797 Fibromyalgia: Secondary | ICD-10-CM | POA: Diagnosis not present

## 2022-08-14 DIAGNOSIS — K224 Dyskinesia of esophagus: Secondary | ICD-10-CM | POA: Diagnosis present

## 2022-08-14 DIAGNOSIS — M1990 Unspecified osteoarthritis, unspecified site: Secondary | ICD-10-CM | POA: Diagnosis not present

## 2022-08-14 DIAGNOSIS — Z83438 Family history of other disorder of lipoprotein metabolism and other lipidemia: Secondary | ICD-10-CM

## 2022-08-14 DIAGNOSIS — E041 Nontoxic single thyroid nodule: Secondary | ICD-10-CM | POA: Diagnosis not present

## 2022-08-14 DIAGNOSIS — M8580 Other specified disorders of bone density and structure, unspecified site: Secondary | ICD-10-CM | POA: Diagnosis not present

## 2022-08-14 DIAGNOSIS — F419 Anxiety disorder, unspecified: Secondary | ICD-10-CM | POA: Diagnosis present

## 2022-08-14 DIAGNOSIS — E039 Hypothyroidism, unspecified: Secondary | ICD-10-CM | POA: Diagnosis present

## 2022-08-14 DIAGNOSIS — R1110 Vomiting, unspecified: Secondary | ICD-10-CM | POA: Diagnosis not present

## 2022-08-14 DIAGNOSIS — Z880 Allergy status to penicillin: Secondary | ICD-10-CM

## 2022-08-14 DIAGNOSIS — E210 Primary hyperparathyroidism: Secondary | ICD-10-CM | POA: Diagnosis not present

## 2022-08-14 DIAGNOSIS — R32 Unspecified urinary incontinence: Secondary | ICD-10-CM | POA: Diagnosis not present

## 2022-08-14 DIAGNOSIS — Z9049 Acquired absence of other specified parts of digestive tract: Secondary | ICD-10-CM | POA: Diagnosis not present

## 2022-08-14 DIAGNOSIS — R079 Chest pain, unspecified: Secondary | ICD-10-CM | POA: Diagnosis not present

## 2022-08-14 LAB — COMPREHENSIVE METABOLIC PANEL
ALT: 16 U/L (ref 0–44)
AST: 18 U/L (ref 15–41)
Albumin: 4 g/dL (ref 3.5–5.0)
Alkaline Phosphatase: 111 U/L (ref 38–126)
Anion gap: 3 — ABNORMAL LOW (ref 5–15)
BUN: 18 mg/dL (ref 8–23)
CO2: 26 mmol/L (ref 22–32)
Calcium: 11.2 mg/dL — ABNORMAL HIGH (ref 8.9–10.3)
Chloride: 108 mmol/L (ref 98–111)
Creatinine, Ser: 1.16 mg/dL — ABNORMAL HIGH (ref 0.44–1.00)
GFR, Estimated: 49 mL/min — ABNORMAL LOW (ref >60)
Glucose, Bld: 105 mg/dL — ABNORMAL HIGH (ref 70–99)
Potassium: 3.9 mmol/L (ref 3.5–5.1)
Sodium: 137 mmol/L (ref 135–145)
Total Bilirubin: 0.8 mg/dL (ref 0.3–1.2)
Total Protein: 7.7 g/dL (ref 6.5–8.1)

## 2022-08-14 LAB — CBC WITH DIFFERENTIAL/PLATELET
Abs Immature Granulocytes: 0.02 10*3/uL (ref 0.00–0.07)
Basophils Absolute: 0.1 10*3/uL (ref 0.0–0.1)
Basophils Relative: 1 %
Eosinophils Absolute: 0.1 10*3/uL (ref 0.0–0.5)
Eosinophils Relative: 1 %
HCT: 38.6 % (ref 36.0–46.0)
Hemoglobin: 11.6 g/dL — ABNORMAL LOW (ref 12.0–15.0)
Immature Granulocytes: 0 %
Lymphocytes Relative: 20 %
Lymphs Abs: 1.5 10*3/uL (ref 0.7–4.0)
MCH: 25.2 pg — ABNORMAL LOW (ref 26.0–34.0)
MCHC: 30.1 g/dL (ref 30.0–36.0)
MCV: 83.7 fL (ref 80.0–100.0)
Monocytes Absolute: 0.6 10*3/uL (ref 0.1–1.0)
Monocytes Relative: 8 %
Neutro Abs: 5 10*3/uL (ref 1.7–7.7)
Neutrophils Relative %: 70 %
Platelets: 248 10*3/uL (ref 150–400)
RBC: 4.61 MIL/uL (ref 3.87–5.11)
RDW: 17.2 % — ABNORMAL HIGH (ref 11.5–15.5)
WBC: 7.2 10*3/uL (ref 4.0–10.5)
nRBC: 0 % (ref 0.0–0.2)

## 2022-08-14 LAB — LIPASE, BLOOD: Lipase: 33 U/L (ref 11–51)

## 2022-08-14 LAB — TROPONIN I (HIGH SENSITIVITY)
Troponin I (High Sensitivity): 5 ng/L (ref <18)
Troponin I (High Sensitivity): 5 ng/L (ref <18)

## 2022-08-14 MED ORDER — HYDROMORPHONE HCL 1 MG/ML IJ SOLN
1.0000 mg | Freq: Once | INTRAMUSCULAR | Status: AC
  Administered 2022-08-14: 1 mg via INTRAVENOUS
  Filled 2022-08-14: qty 1

## 2022-08-14 MED ORDER — SODIUM CHLORIDE 0.9 % IV SOLN
12.5000 mg | Freq: Four times a day (QID) | INTRAVENOUS | Status: DC | PRN
  Administered 2022-08-14: 12.5 mg via INTRAVENOUS
  Filled 2022-08-14: qty 12.5

## 2022-08-14 MED ORDER — DEXTROSE IN LACTATED RINGERS 5 % IV SOLN: INTRAVENOUS | Status: DC

## 2022-08-14 MED ORDER — FAMOTIDINE IN NACL 20-0.9 MG/50ML-% IV SOLN
20.0000 mg | Freq: Once | INTRAVENOUS | Status: AC
  Administered 2022-08-14: 20 mg via INTRAVENOUS
  Filled 2022-08-14: qty 50

## 2022-08-14 MED ORDER — SODIUM CHLORIDE 0.9 % IV BOLUS
1000.0000 mL | Freq: Once | INTRAVENOUS | Status: AC
  Administered 2022-08-14: 1000 mL via INTRAVENOUS

## 2022-08-14 MED ORDER — FENTANYL CITRATE PF 50 MCG/ML IJ SOSY: 12.5000 ug | PREFILLED_SYRINGE | INTRAMUSCULAR | Status: DC | PRN

## 2022-08-14 MED ORDER — METOPROLOL TARTRATE 5 MG/5ML IV SOLN: 5.0000 mg | Freq: Four times a day (QID) | INTRAVENOUS | Status: DC | PRN

## 2022-08-14 MED ORDER — METOCLOPRAMIDE HCL 5 MG/ML IJ SOLN
10.0000 mg | Freq: Four times a day (QID) | INTRAMUSCULAR | Status: DC
  Administered 2022-08-15 – 2022-08-17 (×10): 10 mg via INTRAVENOUS
  Filled 2022-08-14 (×10): qty 2

## 2022-08-14 MED ORDER — ENOXAPARIN SODIUM 40 MG/0.4ML IJ SOSY
40.0000 mg | PREFILLED_SYRINGE | INTRAMUSCULAR | Status: DC
  Administered 2022-08-15 – 2022-08-17 (×3): 40 mg via SUBCUTANEOUS
  Filled 2022-08-14 (×3): qty 0.4

## 2022-08-14 MED ORDER — ONDANSETRON HCL 4 MG/2ML IJ SOLN
4.0000 mg | Freq: Four times a day (QID) | INTRAMUSCULAR | Status: DC
  Administered 2022-08-15 – 2022-08-16 (×6): 4 mg via INTRAVENOUS
  Filled 2022-08-14 (×6): qty 2

## 2022-08-14 MED ORDER — DIPHENHYDRAMINE HCL 50 MG/ML IJ SOLN
25.0000 mg | Freq: Once | INTRAMUSCULAR | Status: AC
  Administered 2022-08-14: 25 mg via INTRAVENOUS
  Filled 2022-08-14: qty 1

## 2022-08-14 MED ORDER — FAMOTIDINE 20 MG PO TABS
20.0000 mg | ORAL_TABLET | Freq: Once | ORAL | Status: DC
  Filled 2022-08-14: qty 1

## 2022-08-14 MED ORDER — ONDANSETRON HCL 4 MG/2ML IJ SOLN
4.0000 mg | Freq: Once | INTRAMUSCULAR | Status: AC
  Administered 2022-08-14: 4 mg via INTRAVENOUS
  Filled 2022-08-14: qty 2

## 2022-08-14 MED ORDER — METOCLOPRAMIDE HCL 5 MG/ML IJ SOLN
10.0000 mg | Freq: Once | INTRAMUSCULAR | Status: AC
  Administered 2022-08-14: 10 mg via INTRAVENOUS
  Filled 2022-08-14: qty 2

## 2022-08-14 MED ORDER — SCOPOLAMINE 1 MG/3DAYS TD PT72
1.0000 | MEDICATED_PATCH | TRANSDERMAL | Status: DC
  Administered 2022-08-15: 1.5 mg via TRANSDERMAL
  Filled 2022-08-14: qty 1

## 2022-08-14 MED ORDER — LATANOPROST 0.005 % OP SOLN
1.0000 [drp] | Freq: Every day | OPHTHALMIC | Status: DC
  Administered 2022-08-15 – 2022-08-16 (×3): 1 [drp] via OPHTHALMIC
  Filled 2022-08-14: qty 2.5

## 2022-08-14 MED ORDER — ONDANSETRON 4 MG PO TBDP: ORAL_TABLET | ORAL | 0 refills | Status: DC

## 2022-08-14 MED ORDER — IOHEXOL 350 MG/ML SOLN
80.0000 mL | Freq: Once | INTRAVENOUS | Status: AC | PRN
  Administered 2022-08-14: 80 mL via INTRAVENOUS

## 2022-08-14 NOTE — Assessment & Plan Note (Signed)
Continue Xalatan

## 2022-08-14 NOTE — ED Provider Notes (Addendum)
[facility] Provider Note   CSN: [id number] Arrival date & time: 08/14/22  1644     History  Chief Complaint  Patient presents with   Emesis    Lynn Jenkins is a 77 y.o. female history of cholecystectomy, here presenting with shortness of breath and vomiting and abdominal pain and dizziness.  Patient states that she had cholecystectomy in October last year.  She states that she was initially doing well and then started having intermittent nausea and vomiting.  Patient states that usually she takes Reglan and the symptoms resolved.  Patient states that for the last 2 days, she was unable to keep anything down.  She states that she also has some left-sided chest pain and subjective shortness of breath.  Patient feels lightheaded and dizzy as well.  She states that her blood pressure was elevated as well.  Patient denies vertiginous symptoms or trouble speaking or focal weakness  The history is provided by the patient.       Home Medications Prior to Admission medications   Medication Sig Start Date End Date Taking? Authorizing Provider  acetaminophen (TYLENOL) 500 MG tablet Take 500-1,000 mg by mouth every 6 (six) hours as needed for mild pain or moderate pain.    [provider]  amoxicillin-clavulanate (AUGMENTIN) 875-125 MG tablet Take 1 tablet by mouth 2 (two) times daily. 04/22/22   Trula Slade, DPM  cholecalciferol (VITAMIN D) 1000 UNITS tablet Take 1,000 Units by mouth daily.    [provider]  cinacalcet (SENSIPAR) 30 MG tablet Take 1 tablet (30 mg total) by mouth daily with breakfast. 03/16/22   Hosie Poisson, MD  co-enzyme Q-10 30 MG capsule Take 30 mg by mouth daily.    [provider]  esomeprazole (NEXIUM) 40 MG capsule Take 40 mg by mouth 2 (two) times daily before a meal.     [provider]  ferrous sulfate 325 (65 FE) MG tablet Take 1 tablet (325 mg total) by mouth 2 (two) times  daily with a meal. 03/15/22   Hosie Poisson, MD  furosemide (LASIX) 20 MG tablet Take 20 mg by mouth daily. 08/26/18   [provider]  Glucosamine-Chondroit-Vit C-Mn (GLUCOSAMINE 1500 COMPLEX) CAPS Take 1 capsule by mouth daily.    [provider]  leflunomide (ARAVA) 10 MG tablet Take 10 mg by mouth daily.    [provider]  levothyroxine (SYNTHROID) 88 MCG tablet Take 88 mcg by mouth daily. 03/06/22   [provider]  metoCLOPramide (REGLAN) 10 MG tablet Take 1 tablet (10 mg total) by mouth every 6 (six) hours as needed for nausea or vomiting. Patient taking differently: Take 5-10 mg by mouth every 6 (six) hours as needed for nausea or vomiting. 11/14/19   Hayden Rasmussen, MD  Misc Natural Products (TOTAL MEMORY & FOCUS FORMULA PO) Take 2 tablets by mouth daily.    [provider]  Omega 3 1000 MG CAPS Take 2,000 mg by mouth 2 (two) times daily.    [provider]  sertraline (ZOLOFT) 100 MG tablet Take 100 mg by mouth daily.    [provider]  SUMAtriptan (IMITREX) 100 MG tablet Take 100 mg by mouth every 2 (two) hours as needed for migraine or headache. 1 tab at onset of headache, may reoeat  In 2 hrs, max of 2 pills in 24 hrs 08/26/18   [provider]  traMADol (ULTRAM) 50 MG tablet Take 1 tablet (50  mg total) by mouth every 6 (six) hours as needed for severe pain. 04/05/22   Trula Slade, DPM  vitamin B-12 (CYANOCOBALAMIN) 1000 MCG tablet Take 1,000 mcg by mouth daily.    [provider]  vitamin C (ASCORBIC ACID) 500 MG tablet Take 500 mg by mouth daily as needed (when sick).     [provider]  XALATAN 0.005 % ophthalmic solution Place 1 drop into the right eye at bedtime. 03/18/15   [provider]      Allergies    Doxycycline, Morphine and related, Other, Zomig [zolmitriptan], Augmentin [amoxicillin-pot clavulanate], Ceftin [cefuroxime axetil], Gabapentin, Methazolamide, Biaxin  [clarithromycin], Cefdinir, and Sumatriptan    Review of Systems   Review of Systems  Gastrointestinal:  Positive for vomiting.  Neurological:  Positive for dizziness.  All other systems reviewed and are negative.   Physical Exam Updated Vital Signs BP (!) 183/96   Pulse 60   Temp 98.8 F (37.1 C)   Resp 20   Wt 91 kg   SpO2 97%   BMI 33.38 kg/m  Physical Exam Vitals and nursing note reviewed.  Constitutional:      Comments: Uncomfortable  HENT:     Head: Normocephalic.     Mouth/Throat:     Mouth: Mucous membranes are moist.  Eyes:     Extraocular Movements: Extraocular movements intact.     Pupils: Pupils are equal, round, and reactive to light.  Cardiovascular:     Rate and Rhythm: Normal rate and regular rhythm.  Pulmonary:     Effort: Pulmonary effort is normal.     Breath sounds: Normal breath sounds.  Abdominal:     General: Abdomen is flat.     Palpations: Abdomen is soft.     Comments: Mild epigastric and right upper quadrant tenderness  Musculoskeletal:        General: Normal range of motion.     Cervical back: Normal range of motion and neck supple.  Skin:    General: Skin is warm.     Capillary Refill: Capillary refill takes less than 2 seconds.  Neurological:     General: No focal deficit present.     Mental Status: She is alert and oriented to person, place, and time.  Psychiatric:        Mood and Affect: Mood normal.        Behavior: Behavior normal.     ED Results / Procedures / Treatments   Labs (all labs ordered are listed, but only abnormal results are displayed) Labs Reviewed  CBC WITH DIFFERENTIAL/PLATELET  COMPREHENSIVE METABOLIC PANEL  LIPASE, BLOOD  TROPONIN I (HIGH SENSITIVITY)    EKG None  Radiology No results found.  Procedures Procedures    Medications Ordered in ED Medications  ondansetron (ZOFRAN) injection 4 mg (has no administration in time range)  HYDROmorphone (DILAUDID) injection 1 mg (has no  administration in time range)  sodium chloride 0.9 % bolus 1,000 mL (has no administration in time range)    ED Course/ Medical Decision Making/ A&P                             Medical Decision Making [name] is a 77 y.o. female here presenting with shortness of breath and abdominal pain.  Patient had previous cholecystectomy.  Consider SBO versus ileus.  Patient also has chest pain and shortness of breath so consider PE as well.  Plan to get CBC  and CMP and CT a chest and CT abdomen pelvis  8:45 PM I reviewed patient's labs and independently interpreted imaging studies.  Patient CT shows large hiatal hernia.  CT chest was unremarkable.  Patient was given multiple doses of nausea medicine pain medicine and felt better.  I think she likely has symptoms from hiatal hernia.  She is already on Nexium.  Will add Pepcid as needed.  Will refer her back to surgery.  10:47 PM I was notified by the nurse that she threw up when she received her paperwork.  I ordered Phenergan and try to get her to drink some water.  She then threw up again.  At this point, patient will be admitted for intractable vomiting.  Problems Addressed: Hiatal hernia: acute illness or injury Nausea and vomiting, unspecified vomiting type: acute illness or injury  Amount and/or Complexity of Data Reviewed Labs: ordered. Decision-making details documented in ED Course. Radiology: ordered and independent interpretation performed. Decision-making details documented in ED Course. ECG/medicine tests: ordered and independent interpretation performed. Decision-making details documented in ED Course.  Risk Prescription drug management. Decision regarding hospitalization.    Final Clinical Impression(s) / ED Diagnoses Final diagnoses:  None    Rx / DC Orders ED Discharge Orders     None         Drenda Freeze, MD 08/14/22 2047    Drenda Freeze, MD 08/14/22 2248

## 2022-08-14 NOTE — ED Notes (Signed)
Pt went to CT via stretcher

## 2022-08-14 NOTE — Assessment & Plan Note (Addendum)
N.p.o. IV fluids Trend electrolytes Scheduled Zofran, Reglan Add scopolamine patch As needed Phenergan Unclear etiology workup thus far has been negative.  If fails to improve could consider GI for upper endoscopy.

## 2022-08-14 NOTE — ED Notes (Signed)
Cleaned pt and changed pt linen placed belongings in labeled pt bag

## 2022-08-14 NOTE — ED Notes (Signed)
Patient vomited again after drinking a few sips of water.

## 2022-08-14 NOTE — Assessment & Plan Note (Signed)
Holding Sensipar

## 2022-08-14 NOTE — Assessment & Plan Note (Signed)
Holding Arava and tramadol

## 2022-08-14 NOTE — Hospital Course (Signed)
Patient is a 77 year old with history of rheumatoid arthritis, hyperparathyroidism, hypothyroidism,, glaucoma, GERD who had acute cholecystitis and underwent cholecystectomy last year.  Since that time she has had episodes of ongoing intractable vomiting which is usually relieved by Reglan.  Her current episode began approximately 2 days ago she has been unable to keep anything down.  She was kept in the ED given IV fluids, Zofran, Pepcid, Phenergan, and Benadryl without significant relief.  Patient had a workup that included normal lipase, normal troponin x 2, normal CMP, normal CT imaging with exception of a large hiatal hernia.  Were asked to admit for this intractable nausea and vomiting and inability to keep any food down.

## 2022-08-14 NOTE — Discharge Instructions (Signed)
You have a large hiatal hernia that is causing your symptoms.  Please continue taking Reglan as needed for nausea.  I also prescribe Zofran for nausea  Please call surgery office for appointment for follow-up regarding your hiatal hernia  I also recommend taking Pepcid 20 mg daily  Return to ER if you have worse abdominal pain or chest pain or vomiting or dehydration

## 2022-08-14 NOTE — Assessment & Plan Note (Signed)
Holding Zoloft

## 2022-08-14 NOTE — H&P (Signed)
History and Physical    Patient: Lynn Jenkins [id number] DOB: 1945/09/12 DOA: 08/14/2022 DOS: the patient was seen and examined on 08/14/2022 PCP: Lawerance Cruel, MD  Patient coming from: Home  Chief Complaint:  Chief Complaint  Patient presents with   Emesis   HPI: Lynn Jenkins is a 77 y.o. female with medical history significant of  rheumatoid arthritis, hyperparathyroidism, hypothyroidism,, glaucoma, GERD who had acute cholecystitis and underwent cholecystectomy last year.  Since that time she has had episodes of ongoing intractable vomiting which is usually relieved by Reglan.  Her current episode began approximately 2 days ago she has been unable to keep anything down.  She was kept in the ED given IV fluids, Zofran, Pepcid, Phenergan, and Benadryl without significant relief.  Patient had a workup that included normal lipase, normal troponin x 2, normal CMP, normal CT imaging with exception of a large hiatal hernia.  Were asked to admit for this intractable nausea and vomiting and inability to keep any food down.  History is limited because the patient has had so many antiemetics that she is unable to stay awake for questioning.  Review of Systems: unable to review all systems due to the inability of the patient to answer questions. Past Medical History:  Diagnosis Date   Anemia    Arthritis    Generalized anxiety disorder    GERD (gastroesophageal reflux disease)    Glaucoma    Headache    hx of migraines    History of hiatal hernia    Hyperthyroidism    Lupus (HCC)    Migraine    Mixed hyperlipidemia    Multinodular goiter    Osteopenia    Primary hyperparathyroidism ([place])    Varicose veins    Vitamin D deficiency    Past Surgical History:  Procedure Laterality Date   ABDOMINAL HYSTERECTOMY     APPENDECTOMY     CHOLECYSTECTOMY N/A 03/11/2022   Procedure: LAPAROSCOPIC CHOLECYSTECTOMY WITH icg dye;  Surgeon: Clovis Riley, MD;  Location: [facility];  Service:  General;  Laterality: N/A;   JOINT REPLACEMENT     left foot surgery      PARATHYROIDECTOMY N/A 11/01/2014   Procedure: NECK EXPLORATION AND PARATHYROIDECTOMY;  Surgeon: Armandina Gemma, MD;  Location: [facility];  Service: General;  Laterality: N/A;   PARATHYROIDECTOMY N/A 10/31/2020   Procedure: PARATHYROIDECTOMY WITH LEFT THYROID LOBECTOMY;  Surgeon: Armandina Gemma, MD;  Location: [facility];  Service: General;  Laterality: N/A;   SHOULDER SURGERY     Social History:  reports that she has never smoked. She has never used smokeless tobacco. She reports that she does not drink alcohol and does not use drugs.  Allergies  Allergen Reactions   Doxycycline Swelling    Facial swelling, itching   Morphine And Related Swelling   Other Swelling   Zomig [Zolmitriptan] Nausea And Vomiting    Increased heart rate    Augmentin [Amoxicillin-Pot Clavulanate] Diarrhea   Ceftin [Cefuroxime Axetil] Other (See Comments)    Stomach ache   Gabapentin Other (See Comments)    Confusion, off balance   Methazolamide Diarrhea   Biaxin [Clarithromycin] Rash   Cefdinir Rash   Sumatriptan Other (See Comments)    Can use brand Imitrex    Family History  Problem Relation Age of Onset   Heart disease Mother    Hyperlipidemia Mother    Hypertension Mother    Heart disease Father    Hyperlipidemia Father    Hypertension Father  Cancer Brother    Hypertension Brother     Prior to Admission medications   Medication Sig Start Date End Date Taking? Authorizing Provider  ondansetron (ZOFRAN-ODT) 4 MG disintegrating tablet '4mg'$  ODT q4 hours prn nausea/vomit 08/14/22  Yes Drenda Freeze, MD  acetaminophen (TYLENOL) 500 MG tablet Take 500-1,000 mg by mouth every 6 (six) hours as needed for mild pain or moderate pain.    [provider]  cholecalciferol (VITAMIN D) 1000 UNITS tablet Take 1,000 Units by mouth daily.    [provider]  cinacalcet (SENSIPAR) 30 MG tablet Take 1 tablet (30 mg total) by  mouth daily with breakfast. 03/16/22   Hosie Poisson, MD  co-enzyme Q-10 30 MG capsule Take 30 mg by mouth daily.    [provider]  esomeprazole (NEXIUM) 40 MG capsule Take 40 mg by mouth 2 (two) times daily before a meal.     [provider]  ferrous sulfate 325 (65 FE) MG tablet Take 1 tablet (325 mg total) by mouth 2 (two) times daily with a meal. 03/15/22   Hosie Poisson, MD  furosemide (LASIX) 20 MG tablet Take 20 mg by mouth daily. 08/26/18   [provider]  Glucosamine-Chondroit-Vit C-Mn (GLUCOSAMINE 1500 COMPLEX) CAPS Take 1 capsule by mouth daily.    [provider]  leflunomide (ARAVA) 10 MG tablet Take 10 mg by mouth daily.    [provider]  levothyroxine (SYNTHROID) 88 MCG tablet Take 88 mcg by mouth daily. 03/06/22   [provider]  metoCLOPramide (REGLAN) 10 MG tablet Take 1 tablet (10 mg total) by mouth every 6 (six) hours as needed for nausea or vomiting. Patient taking differently: Take 5-10 mg by mouth every 6 (six) hours as needed for nausea or vomiting. 11/14/19   Hayden Rasmussen, MD  Misc Natural Products (TOTAL MEMORY & FOCUS FORMULA PO) Take 2 tablets by mouth daily.    [provider]  Omega 3 1000 MG CAPS Take 2,000 mg by mouth 2 (two) times daily.    [provider]  sertraline (ZOLOFT) 100 MG tablet Take 100 mg by mouth daily.    [provider]  SUMAtriptan (IMITREX) 100 MG tablet Take 100 mg by mouth every 2 (two) hours as needed for migraine or headache. 1 tab at onset of headache, may reoeat  In 2 hrs, max of 2 pills in 24 hrs 08/26/18   [provider]  traMADol (ULTRAM) 50 MG tablet Take 1 tablet (50 mg total) by mouth every 6 (six) hours as needed for severe pain. 04/05/22   Trula Slade, DPM  vitamin B-12 (CYANOCOBALAMIN) 1000 MCG tablet Take 1,000 mcg by mouth daily.    [provider]  vitamin C (ASCORBIC ACID) 500 MG tablet Take 500 mg by mouth daily as  needed (when sick).     [provider]  XALATAN 0.005 % ophthalmic solution Place 1 drop into the right eye at bedtime. 03/18/15   [provider]    Physical Exam: Vitals:   08/14/22 2051 08/14/22 2100 08/14/22 2130 08/14/22 2200  BP:  (!) 175/99 (!) 157/81 (!) 163/98  Pulse:  (!) 112 75 70  Resp:  '18 12 12  '$ Temp: 97.6 F (36.4 C)     TempSrc: Oral     SpO2:  100% 99% 100%  Weight:       Physical Examination: General appearance - normal appearing weight and somnolent Neck - supple, no significant adenopathy Chest -  clear to auscultation, no wheezes, rales or rhonchi, symmetric air entry Heart - normal rate, regular rhythm, normal S1, S2, no murmurs, rubs, clicks or gallops Abdomen - soft, nontender, nondistended, no masses or organomegaly Extremities -1-2+ pedal edema  Data Reviewed: Results for orders placed or performed during the hospital encounter of 08/14/22 (from the past 24 hour(s))  CBC with Differential     Status: Abnormal   Collection Time: 08/14/22  5:20 PM  Result Value Ref Range   WBC 7.2 4.0 - 10.5 K/uL   RBC 4.61 3.87 - 5.11 MIL/uL   Hemoglobin 11.6 (L) 12.0 - 15.0 g/dL   HCT 38.6 36.0 - 46.0 %   MCV 83.7 80.0 - 100.0 fL   MCH 25.2 (L) 26.0 - 34.0 pg   MCHC 30.1 30.0 - 36.0 g/dL   RDW 17.2 (H) 11.5 - 15.5 %   Platelets 248 150 - 400 K/uL   nRBC 0.0 0.0 - 0.2 %   Neutrophils Relative % 70 %   Neutro Abs 5.0 1.7 - 7.7 K/uL   Lymphocytes Relative 20 %   Lymphs Abs 1.5 0.7 - 4.0 K/uL   Monocytes Relative 8 %   Monocytes Absolute 0.6 0.1 - 1.0 K/uL   Eosinophils Relative 1 %   Eosinophils Absolute 0.1 0.0 - 0.5 K/uL   Basophils Relative 1 %   Basophils Absolute 0.1 0.0 - 0.1 K/uL   Immature Granulocytes 0 %   Abs Immature Granulocytes 0.02 0.00 - 0.07 K/uL  Comprehensive metabolic panel     Status: Abnormal   Collection Time: 08/14/22  5:20 PM  Result Value Ref Range   Sodium 137 135 - 145 mmol/L   Potassium 3.9 3.5 - 5.1 mmol/L    Chloride 108 98 - 111 mmol/L   CO2 26 22 - 32 mmol/L   Glucose, Bld 105 (H) 70 - 99 mg/dL   BUN 18 8 - 23 mg/dL   Creatinine, Ser 1.16 (H) 0.44 - 1.00 mg/dL   Calcium 11.2 (H) 8.9 - 10.3 mg/dL   Total Protein 7.7 6.5 - 8.1 g/dL   Albumin 4.0 3.5 - 5.0 g/dL   AST 18 15 - 41 U/L   ALT 16 0 - 44 U/L   Alkaline Phosphatase 111 38 - 126 U/L   Total Bilirubin 0.8 0.3 - 1.2 mg/dL   GFR, Estimated 49 (L) >60 mL/min   Anion gap 3 (L) 5 - 15  Lipase, blood     Status: None   Collection Time: 08/14/22  5:20 PM  Result Value Ref Range   Lipase 33 11 - 51 U/L  Troponin I (High Sensitivity)     Status: None   Collection Time: 08/14/22  5:20 PM  Result Value Ref Range   Troponin I (High Sensitivity) 5 <18 ng/L  Troponin I (High Sensitivity)     Status: None   Collection Time: 08/14/22  7:18 PM  Result Value Ref Range   Troponin I (High Sensitivity) 5 <18 ng/L   CT Angio Chest PE W and/or Wo Contrast  Result Date: 08/14/2022 CLINICAL DATA:  Chest and abdominal pain, initial encounter EXAM: CT ANGIOGRAPHY CHEST CT ABDOMEN AND PELVIS WITH CONTRAST TECHNIQUE: Multidetector CT imaging of the chest was performed using the standard protocol during bolus administration of intravenous contrast. Multiplanar CT image reconstructions and MIPs were obtained to evaluate the vascular anatomy. Multidetector CT imaging of the abdomen and pelvis was performed using the standard protocol during bolus administration of intravenous contrast. RADIATION  DOSE REDUCTION: This exam was performed according to the departmental dose-optimization program which includes automated exposure control, adjustment of the mA and/or kV according to patient size and/or use of iterative reconstruction technique. CONTRAST:  67m OMNIPAQUE IOHEXOL 350 MG/ML SOLN COMPARISON:  None Available. FINDINGS: CTA CHEST FINDINGS Cardiovascular: Thoracic aorta shows atherosclerotic calcifications without aneurysmal dilatation or dissection. No cardiac  enlargement is noted. Mild coronary calcifications are seen. The pulmonary artery shows a branching pattern bilaterally. Filling defect to suggest pulmonary embolism is noted. Mediastinum/Nodes: Thoracic inlet shows a dominant 2.5 cm nodule in the right lobe of the thyroid. By history this is been previously biopsied and no further follow-up is recommended. Left thyroidectomy is noted. No hilar or mediastinal adenopathy is noted. Fluid is noted within the esophagus likely related to reflux. A large hiatal hernia is noted. Lungs/Pleura: Mild basilar atelectasis is noted in the lungs bilaterally. No focal infiltrate or effusion is seen. No parenchymal nodules are noted. Musculoskeletal: Degenerative changes of the thoracic spine are noted. No acute rib abnormality is noted. Review of the MIP images confirms the above findings. CT ABDOMEN and PELVIS FINDINGS Hepatobiliary: No focal liver abnormality is seen. Status post cholecystectomy. No biliary dilatation. Pancreas: Unremarkable. No pancreatic ductal dilatation or surrounding inflammatory changes. Spleen: Normal in size without focal abnormality. Adrenals/Urinary Tract: Adrenal glands are within normal limits. Kidneys demonstrate a normal enhancement pattern bilaterally. Small cysts are noted bilaterally which appear simple in nature stable from prior MRI examination. No further follow-up is recommended. No calculi or obstructive changes are seen. Delayed images demonstrate normal excretion of contrast material. The bladder is partially distended. Stomach/Bowel: Scattered diverticular changes noted with wall thickening. No evidence of diverticulitis is seen. The appendix has been surgically removed. Small bowel and stomach are within normal limits aside from the large hiatal hernia. Vascular/Lymphatic: Aortic atherosclerosis. No enlarged abdominal or pelvic lymph nodes. Reproductive: Status post hysterectomy. No adnexal masses. Other: No abdominal wall hernia or  abnormality. No abdominopelvic ascites. Musculoskeletal: Multilevel degenerative changes are noted. Review of the MIP images confirms the above findings. IMPRESSION: CTA of the chest: No evidence of aortic dissection or aneurysmal dilatation. No evidence of pulmonary emboli. Dominant right thyroid nodule stable from recent ultrasound examination. This has been evaluated on previous imaging. (ref: J Am Coll Radiol. 2015 Feb;12(2): 143-50). CT of the abdomen and pelvis: Large hiatal hernia. Diverticulosis without diverticulitis. No other focal abnormality is noted. Electronically Signed   By: MInez CatalinaM.D.   On: 08/14/2022 20:07   CT ABDOMEN PELVIS W CONTRAST  Result Date: 08/14/2022 CLINICAL DATA:  Chest and abdominal pain, initial encounter EXAM: CT ANGIOGRAPHY CHEST CT ABDOMEN AND PELVIS WITH CONTRAST TECHNIQUE: Multidetector CT imaging of the chest was performed using the standard protocol during bolus administration of intravenous contrast. Multiplanar CT image reconstructions and MIPs were obtained to evaluate the vascular anatomy. Multidetector CT imaging of the abdomen and pelvis was performed using the standard protocol during bolus administration of intravenous contrast. RADIATION DOSE REDUCTION: This exam was performed according to the departmental dose-optimization program which includes automated exposure control, adjustment of the mA and/or kV according to patient size and/or use of iterative reconstruction technique. CONTRAST:  815mOMNIPAQUE IOHEXOL 350 MG/ML SOLN COMPARISON:  None Available. FINDINGS: CTA CHEST FINDINGS Cardiovascular: Thoracic aorta shows atherosclerotic calcifications without aneurysmal dilatation or dissection. No cardiac enlargement is noted. Mild coronary calcifications are seen. The pulmonary artery shows a branching pattern bilaterally. Filling defect to suggest pulmonary embolism is noted. Mediastinum/Nodes:  Thoracic inlet shows a dominant 2.5 cm nodule in the right  lobe of the thyroid. By history this is been previously biopsied and no further follow-up is recommended. Left thyroidectomy is noted. No hilar or mediastinal adenopathy is noted. Fluid is noted within the esophagus likely related to reflux. A large hiatal hernia is noted. Lungs/Pleura: Mild basilar atelectasis is noted in the lungs bilaterally. No focal infiltrate or effusion is seen. No parenchymal nodules are noted. Musculoskeletal: Degenerative changes of the thoracic spine are noted. No acute rib abnormality is noted. Review of the MIP images confirms the above findings. CT ABDOMEN and PELVIS FINDINGS Hepatobiliary: No focal liver abnormality is seen. Status post cholecystectomy. No biliary dilatation. Pancreas: Unremarkable. No pancreatic ductal dilatation or surrounding inflammatory changes. Spleen: Normal in size without focal abnormality. Adrenals/Urinary Tract: Adrenal glands are within normal limits. Kidneys demonstrate a normal enhancement pattern bilaterally. Small cysts are noted bilaterally which appear simple in nature stable from prior MRI examination. No further follow-up is recommended. No calculi or obstructive changes are seen. Delayed images demonstrate normal excretion of contrast material. The bladder is partially distended. Stomach/Bowel: Scattered diverticular changes noted with wall thickening. No evidence of diverticulitis is seen. The appendix has been surgically removed. Small bowel and stomach are within normal limits aside from the large hiatal hernia. Vascular/Lymphatic: Aortic atherosclerosis. No enlarged abdominal or pelvic lymph nodes. Reproductive: Status post hysterectomy. No adnexal masses. Other: No abdominal wall hernia or abnormality. No abdominopelvic ascites. Musculoskeletal: Multilevel degenerative changes are noted. Review of the MIP images confirms the above findings. IMPRESSION: CTA of the chest: No evidence of aortic dissection or aneurysmal dilatation. No evidence of  pulmonary emboli. Dominant right thyroid nodule stable from recent ultrasound examination. This has been evaluated on previous imaging. (ref: J Am Coll Radiol. 2015 Feb;12(2): 143-50). CT of the abdomen and pelvis: Large hiatal hernia. Diverticulosis without diverticulitis. No other focal abnormality is noted. Electronically Signed   By: Inez Catalina M.D.   On: 08/14/2022 20:07     Assessment and Plan: * Intractable vomiting N.p.o. IV fluids Trend electrolytes Scheduled Zofran, Reglan Add scopolamine patch As needed Phenergan Unclear etiology workup thus far has been negative.  If fails to improve could consider GI for upper endoscopy.  Hyperparathyroidism, primary ([place]) Holding Sensipar  Glaucoma Continue Xalatan  Inflammatory arthritis Holding Arava and tramadol  Fibromyalgia Holding Zoloft  Migraine headache without aura Holding Imitrex  Hypothyroidism Holding Synthroid Check TSH       Advance Care Planning:   Code Status: Full Code    Consults: None  Family Communication: Patient at bedside  Severity of Illness: The appropriate patient status for this patient is OBSERVATION. Observation status is judged to be reasonable and necessary in order to provide the required intensity of service to ensure the patient's safety. The patient's presenting symptoms, physical exam findings, and initial radiographic and laboratory data in the context of their medical condition is felt to place them at decreased risk for further clinical deterioration. Furthermore, it is anticipated that the patient will be medically stable for discharge from the hospital within 2 midnights of admission.   Author: Donnamae Jude, MD 08/14/2022 11:32 PM  For on call review [email or web address].

## 2022-08-14 NOTE — Assessment & Plan Note (Signed)
Holding Imitrex

## 2022-08-14 NOTE — Assessment & Plan Note (Signed)
Holding Synthroid Check TSH

## 2022-08-14 NOTE — ED Notes (Signed)
Pt back from CT

## 2022-08-14 NOTE — ED Triage Notes (Signed)
Generalized abd pain with N/v and dizziness x2 days.  Referred to ER by PCP.  Denies cp and sob Cholecystectomy 03/11/22

## 2022-08-15 DIAGNOSIS — R32 Unspecified urinary incontinence: Secondary | ICD-10-CM | POA: Diagnosis present

## 2022-08-15 DIAGNOSIS — R1110 Vomiting, unspecified: Secondary | ICD-10-CM | POA: Diagnosis not present

## 2022-08-15 DIAGNOSIS — G43009 Migraine without aura, not intractable, without status migrainosus: Secondary | ICD-10-CM | POA: Diagnosis present

## 2022-08-15 DIAGNOSIS — Z8249 Family history of ischemic heart disease and other diseases of the circulatory system: Secondary | ICD-10-CM | POA: Diagnosis not present

## 2022-08-15 DIAGNOSIS — M329 Systemic lupus erythematosus, unspecified: Secondary | ICD-10-CM | POA: Diagnosis present

## 2022-08-15 DIAGNOSIS — Z881 Allergy status to other antibiotic agents status: Secondary | ICD-10-CM | POA: Diagnosis not present

## 2022-08-15 DIAGNOSIS — Z83438 Family history of other disorder of lipoprotein metabolism and other lipidemia: Secondary | ICD-10-CM | POA: Diagnosis not present

## 2022-08-15 DIAGNOSIS — K449 Diaphragmatic hernia without obstruction or gangrene: Secondary | ICD-10-CM | POA: Diagnosis present

## 2022-08-15 DIAGNOSIS — Z90710 Acquired absence of both cervix and uterus: Secondary | ICD-10-CM | POA: Diagnosis not present

## 2022-08-15 DIAGNOSIS — M064 Inflammatory polyarthropathy: Secondary | ICD-10-CM | POA: Diagnosis present

## 2022-08-15 DIAGNOSIS — K219 Gastro-esophageal reflux disease without esophagitis: Secondary | ICD-10-CM | POA: Diagnosis present

## 2022-08-15 DIAGNOSIS — R112 Nausea with vomiting, unspecified: Secondary | ICD-10-CM | POA: Diagnosis present

## 2022-08-15 DIAGNOSIS — M8580 Other specified disorders of bone density and structure, unspecified site: Secondary | ICD-10-CM | POA: Diagnosis present

## 2022-08-15 DIAGNOSIS — Z8719 Personal history of other diseases of the digestive system: Secondary | ICD-10-CM | POA: Diagnosis not present

## 2022-08-15 DIAGNOSIS — F419 Anxiety disorder, unspecified: Secondary | ICD-10-CM | POA: Diagnosis present

## 2022-08-15 DIAGNOSIS — Z9049 Acquired absence of other specified parts of digestive tract: Secondary | ICD-10-CM | POA: Diagnosis not present

## 2022-08-15 DIAGNOSIS — Z79899 Other long term (current) drug therapy: Secondary | ICD-10-CM | POA: Diagnosis not present

## 2022-08-15 DIAGNOSIS — E210 Primary hyperparathyroidism: Secondary | ICD-10-CM | POA: Diagnosis present

## 2022-08-15 DIAGNOSIS — M797 Fibromyalgia: Secondary | ICD-10-CM | POA: Diagnosis present

## 2022-08-15 DIAGNOSIS — D649 Anemia, unspecified: Secondary | ICD-10-CM | POA: Diagnosis present

## 2022-08-15 DIAGNOSIS — Z79890 Hormone replacement therapy (postmenopausal): Secondary | ICD-10-CM | POA: Diagnosis not present

## 2022-08-15 DIAGNOSIS — E039 Hypothyroidism, unspecified: Secondary | ICD-10-CM | POA: Diagnosis present

## 2022-08-15 DIAGNOSIS — H409 Unspecified glaucoma: Secondary | ICD-10-CM | POA: Diagnosis present

## 2022-08-15 DIAGNOSIS — E782 Mixed hyperlipidemia: Secondary | ICD-10-CM | POA: Diagnosis present

## 2022-08-15 DIAGNOSIS — M1990 Unspecified osteoarthritis, unspecified site: Secondary | ICD-10-CM | POA: Diagnosis present

## 2022-08-15 LAB — BASIC METABOLIC PANEL
Anion gap: 6 (ref 5–15)
BUN: 13 mg/dL (ref 8–23)
CO2: 23 mmol/L (ref 22–32)
Calcium: 10.1 mg/dL (ref 8.9–10.3)
Chloride: 105 mmol/L (ref 98–111)
Creatinine, Ser: 0.91 mg/dL (ref 0.44–1.00)
GFR, Estimated: >60 mL/min (ref >60)
Glucose, Bld: 175 mg/dL — ABNORMAL HIGH (ref 70–99)
Potassium: 3.7 mmol/L (ref 3.5–5.1)
Sodium: 134 mmol/L — ABNORMAL LOW (ref 135–145)

## 2022-08-15 LAB — CBC
HCT: 37.5 % (ref 36.0–46.0)
Hemoglobin: 11.2 g/dL — ABNORMAL LOW (ref 12.0–15.0)
MCH: 25.1 pg — ABNORMAL LOW (ref 26.0–34.0)
MCHC: 29.9 g/dL — ABNORMAL LOW (ref 30.0–36.0)
MCV: 84.1 fL (ref 80.0–100.0)
Platelets: 229 10*3/uL (ref 150–400)
RBC: 4.46 MIL/uL (ref 3.87–5.11)
RDW: 16.8 % — ABNORMAL HIGH (ref 11.5–15.5)
WBC: 9 10*3/uL (ref 4.0–10.5)
nRBC: 0 % (ref 0.0–0.2)

## 2022-08-15 LAB — TSH: TSH: 1.243 u[IU]/mL (ref 0.350–4.500)

## 2022-08-15 MED ORDER — LEVOTHYROXINE SODIUM 50 MCG PO TABS
75.0000 ug | ORAL_TABLET | Freq: Every day | ORAL | Status: DC
  Administered 2022-08-16 – 2022-08-17 (×2): 75 ug via ORAL
  Filled 2022-08-15 (×2): qty 1

## 2022-08-15 MED ORDER — SERTRALINE HCL 100 MG PO TABS
100.0000 mg | ORAL_TABLET | Freq: Every day | ORAL | Status: DC
  Administered 2022-08-15 – 2022-08-17 (×3): 100 mg via ORAL
  Filled 2022-08-15 (×3): qty 1

## 2022-08-15 MED ORDER — ACETAMINOPHEN 500 MG PO TABS
1000.0000 mg | ORAL_TABLET | Freq: Once | ORAL | Status: AC
  Administered 2022-08-15: 1000 mg via ORAL
  Filled 2022-08-15: qty 2

## 2022-08-15 MED ORDER — FAMOTIDINE IN NACL 20-0.9 MG/50ML-% IV SOLN
20.0000 mg | Freq: Two times a day (BID) | INTRAVENOUS | Status: DC
  Administered 2022-08-15 – 2022-08-16 (×4): 20 mg via INTRAVENOUS
  Filled 2022-08-15 (×4): qty 50

## 2022-08-15 MED ORDER — LORAZEPAM 0.5 MG PO TABS
0.5000 mg | ORAL_TABLET | Freq: Four times a day (QID) | ORAL | Status: DC | PRN
  Administered 2022-08-15 (×2): 0.5 mg via ORAL
  Filled 2022-08-15 (×2): qty 1

## 2022-08-15 NOTE — Consult Note (Signed)
Lynn Jenkins June 13, 1945  [id number].    Requesting MD: Sloan Leiter, MD Chief Complaint/Reason for Consult: hiatal hernia  HPI:  Lynn Jenkins is a 77 y/o F with a PMH cholecystectomy, hiatal hernia, glaucoma, hyperparathyroidism, GERD, urinary incontinence, and anxiety who presents with cc nausea and vomiting. She tells me that 3 days ago she woke up from her sleep at 0600 and immediately had to vomit. States the vomit was bilious and for the last 2-3 days she has had persistent vomiting, not relieved by reglan. She reports inability to tolerate solids and liquids for the last 2-3 days. Other associated symptoms include epigastric discomfort and headache. She has a history of mild intermittent nausea that usually resolves with reglan. She also reports emesis when she gets migraines but denies migraine HA at this time. She denies fever, sick contacts, recent travel, or new foods. She tells me that she used to see Dr. Penelope Coop and her last endoscopy was 4-5 years ago. She tells me that at that time her upper endoscopy was normal other than the non-obstructing hiatal hernia. She also reports history of benign colon polyps. At baseline she lives at home with her husband, whom she says is in poor health, and she works at [employer].   ROS: Review of Systems  All other systems reviewed and are negative.   Family History  Problem Relation Age of Onset   Heart disease Mother    Hyperlipidemia Mother    Hypertension Mother    Heart disease Father    Hyperlipidemia Father    Hypertension Father    Cancer Brother    Hypertension Brother     Past Medical History:  Diagnosis Date   Anemia    Arthritis    Generalized anxiety disorder    GERD (gastroesophageal reflux disease)    Glaucoma    Headache    hx of migraines    History of hiatal hernia    Hyperthyroidism    Lupus (HCC)    Migraine    Mixed hyperlipidemia    Multinodular goiter    Osteopenia    Primary hyperparathyroidism ([place])     Varicose veins    Vitamin D deficiency     Past Surgical History:  Procedure Laterality Date   ABDOMINAL HYSTERECTOMY     APPENDECTOMY     CHOLECYSTECTOMY N/A 03/11/2022   Procedure: LAPAROSCOPIC CHOLECYSTECTOMY WITH icg dye;  Surgeon: Clovis Riley, MD;  Location: [facility];  Service: General;  Laterality: N/A;   JOINT REPLACEMENT     left foot surgery      PARATHYROIDECTOMY N/A 11/01/2014   Procedure: NECK EXPLORATION AND PARATHYROIDECTOMY;  Surgeon: Armandina Gemma, MD;  Location: [facility];  Service: General;  Laterality: N/A;   PARATHYROIDECTOMY N/A 10/31/2020   Procedure: PARATHYROIDECTOMY WITH LEFT THYROID LOBECTOMY;  Surgeon: Armandina Gemma, MD;  Location: [facility];  Service: General;  Laterality: N/A;   SHOULDER SURGERY      Social History:  reports that she has never smoked. She has never used smokeless tobacco. She reports that she does not drink alcohol and does not use drugs.  Allergies:  Allergies  Allergen Reactions   Doxycycline Swelling    Facial swelling, itching   Morphine And Related Swelling   Other Swelling   Zomig [Zolmitriptan] Nausea And Vomiting    Increased heart rate    Augmentin [Amoxicillin-Pot Clavulanate] Diarrhea   Ceftin [Cefuroxime Axetil] Other (See Comments)    Stomach ache   Gabapentin Other (See Comments)  Confusion, off balance   Ferrous Sulfate Diarrhea   Methazolamide Diarrhea   Biaxin [Clarithromycin] Rash   Cefdinir Rash   Sumatriptan Other (See Comments)    Can use brand Imitrex    Medications Prior to Admission  Medication Sig Dispense Refill   acetaminophen (TYLENOL) 500 MG tablet Take 500-1,000 mg by mouth every 6 (six) hours as needed for mild pain or moderate pain.     esomeprazole (NEXIUM) 40 MG capsule Take 40 mg by mouth 2 (two) times daily before a meal.      ibuprofen (ADVIL) 200 MG tablet Take 200-400 mg by mouth daily as needed (for pain).     levothyroxine (SYNTHROID) 88 MCG tablet Take 88 mcg by mouth daily before  breakfast.     loperamide (IMODIUM A-D) 2 MG tablet Take 2 mg by mouth 4 (four) times daily as needed for diarrhea or loose stools.     metoCLOPramide (REGLAN) 10 MG tablet Take 1 tablet (10 mg total) by mouth every 6 (six) hours as needed for nausea or vomiting. (Patient taking differently: Take 5-10 mg by mouth See admin instructions. Take 5-10 mg by mouth up to four times a day before meals for nausea) 30 tablet 0   nystatin (MYCOSTATIN/NYSTOP) powder Apply 1 Application topically 2 (two) times daily as needed (to affected area, if a rash is present).     REFRESH PLUS 0.5 % SOLN Place 1 drop into both eyes 3 (three) times daily as needed (for dryness).     SUMAtriptan (IMITREX) 100 MG tablet Take 100 mg by mouth See admin instructions. Take 100 mg by mouth at onset of headache and may repeat once in 2 hours, if no relief- Max of 200 mg/24 hours     cinacalcet (SENSIPAR) 30 MG tablet Take 1 tablet (30 mg total) by mouth daily with breakfast. 30 tablet 0   ferrous sulfate 325 (65 FE) MG tablet Take 1 tablet (325 mg total) by mouth 2 (two) times daily with a meal. (Patient not taking: Reported on 08/15/2022) 60 tablet 3   furosemide (LASIX) 20 MG tablet Take 20 mg by mouth daily.     Glucosamine-Chondroit-Vit C-Mn (GLUCOSAMINE 1500 COMPLEX) CAPS Take 1 capsule by mouth daily.     leflunomide (ARAVA) 10 MG tablet Take 10 mg by mouth daily.     Misc Natural Products (TOTAL MEMORY & FOCUS FORMULA PO) Take 2 tablets by mouth daily.     sertraline (ZOLOFT) 100 MG tablet Take 100 mg by mouth daily.     timolol (TIMOPTIC) 0.5 % ophthalmic solution Place 1 drop into the right eye 2 (two) times daily.     traMADol (ULTRAM) 50 MG tablet Take 1 tablet (50 mg total) by mouth every 6 (six) hours as needed for severe pain. 10 tablet 0   XALATAN 0.005 % ophthalmic solution Place 1 drop into both eyes at bedtime.  4     Physical Exam: Blood pressure (!) 151/97, pulse 67, temperature 98 F (36.7 C), temperature  source Oral, resp. rate 20, height '5\' 5"'$  (1.651 m), weight 89.9 kg, SpO2 93 %. General: Pleasant female laying on hospital bed, appears stated age, NAD. HEENT: head -normocephalic, atraumatic; Eyes: PERRLA, no conjunctival injection Neck- Trachea is midline CV- RRR, normal S1/S2, no M/R/G, no lower extremity edema  Pulm- breathing is non-labored ORA Abd- soft, mild tenderness in epigastric region without guarding, no hernias. No palpable HSM GU- deferred  MSK- UE/LE symmetrical Neuro- non-focal exam, gait not assessed  Psych- Alert and Oriented x3 with appropriate affect Skin: warm and dry, no rashes or lesions   Results for orders placed or performed during the hospital encounter of 08/14/22 (from the past 48 hour(s))  CBC with Differential     Status: Abnormal   Collection Time: 08/14/22  5:20 PM  Result Value Ref Range   WBC 7.2 4.0 - 10.5 K/uL   RBC 4.61 3.87 - 5.11 MIL/uL   Hemoglobin 11.6 (L) 12.0 - 15.0 g/dL   HCT 38.6 36.0 - 46.0 %   MCV 83.7 80.0 - 100.0 fL   MCH 25.2 (L) 26.0 - 34.0 pg   MCHC 30.1 30.0 - 36.0 g/dL   RDW 17.2 (H) 11.5 - 15.5 %   Platelets 248 150 - 400 K/uL   nRBC 0.0 0.0 - 0.2 %   Neutrophils Relative % 70 %   Neutro Abs 5.0 1.7 - 7.7 K/uL   Lymphocytes Relative 20 %   Lymphs Abs 1.5 0.7 - 4.0 K/uL   Monocytes Relative 8 %   Monocytes Absolute 0.6 0.1 - 1.0 K/uL   Eosinophils Relative 1 %   Eosinophils Absolute 0.1 0.0 - 0.5 K/uL   Basophils Relative 1 %   Basophils Absolute 0.1 0.0 - 0.1 K/uL   Immature Granulocytes 0 %   Abs Immature Granulocytes 0.02 0.00 - 0.07 K/uL    Comment: Performed at [facility], [place] [address] [place], [place] [address]  Comprehensive metabolic panel     Status: Abnormal   Collection Time: 08/14/22  5:20 PM  Result Value Ref Range   Sodium 137 135 - 145 mmol/L   Potassium 3.9 3.5 - 5.1 mmol/L   Chloride 108 98 - 111 mmol/L   CO2 26 22 - 32 mmol/L   Glucose, Bld 105 (H) 70 - 99 mg/dL     Comment: Glucose reference range applies only to samples taken after fasting for at least 8 hours.   BUN 18 8 - 23 mg/dL   Creatinine, Ser 1.16 (H) 0.44 - 1.00 mg/dL   Calcium 11.2 (H) 8.9 - 10.3 mg/dL   Total Protein 7.7 6.5 - 8.1 g/dL   Albumin 4.0 3.5 - 5.0 g/dL   AST 18 15 - 41 U/L   ALT 16 0 - 44 U/L   Alkaline Phosphatase 111 38 - 126 U/L   Total Bilirubin 0.8 0.3 - 1.2 mg/dL   GFR, Estimated 49 (L) >60 mL/min    Comment: (NOTE) Calculated using the CKD-EPI Creatinine Equation (2021)    Anion gap 3 (L) 5 - 15    Comment: Performed at [facility], [place] [address] [place], [place] [address]  Lipase, blood     Status: None   Collection Time: 08/14/22  5:20 PM  Result Value Ref Range   Lipase 33 11 - 51 U/L    Comment: Performed at [facility], [place] [address], [place], [place] [address]  Troponin I (High Sensitivity)     Status: None   Collection Time: 08/14/22  5:20 PM  Result Value Ref Range   Troponin I (High Sensitivity) 5 <18 ng/L    Comment: (NOTE) Elevated high sensitivity troponin I (hsTnI) values and significant  changes across serial measurements may suggest ACS but many other  chronic and acute conditions are known to elevate hsTnI results.  Refer to the "Links" section for chest pain algorithms and additional  guidance. Performed at [facility], [place] [address]  [place], [place] [address]   Troponin I (High Sensitivity)     Status: None   Collection Time: 08/14/22  7:18 PM  Result Value Ref Range   Troponin I (High Sensitivity) 5 <18 ng/L    Comment: (NOTE) Elevated high sensitivity troponin I (hsTnI) values and significant  changes across serial measurements may suggest ACS but many other  chronic and acute conditions are known to elevate hsTnI results.  Refer to the "Links" section for chest pain algorithms and additional  guidance. Performed at [facility], [place] [address] [place], [place] [id number]   Basic metabolic panel     Status: Abnormal   Collection Time: 08/15/22  6:07 AM  Result Value Ref Range   Sodium 134 (L) 135 - 145 mmol/L   Potassium 3.7 3.5 - 5.1 mmol/L   Chloride 105 98 - 111 mmol/L   CO2 23 22 - 32 mmol/L   Glucose, Bld 175 (H) 70 - 99 mg/dL    Comment: Glucose reference range applies only to samples taken after fasting for at least 8 hours.   BUN 13 8 - 23 mg/dL   Creatinine, Ser 0.91 0.44 - 1.00 mg/dL   Calcium 10.1 8.9 - 10.3 mg/dL   GFR, Estimated >60 >60 mL/min    Comment: (NOTE) Calculated using the CKD-EPI Creatinine Equation (2021)    Anion gap 6 5 - 15    Comment: Performed at [facility], [place] [address], [place], [place] [address]  CBC     Status: Abnormal   Collection Time: 08/15/22  6:07 AM  Result Value Ref Range   WBC 9.0 4.0 - 10.5 K/uL   RBC 4.46 3.87 - 5.11 MIL/uL   Hemoglobin 11.2 (L) 12.0 - 15.0 g/dL   HCT 37.5 36.0 - 46.0 %   MCV 84.1 80.0 - 100.0 fL   MCH 25.1 (L) 26.0 - 34.0 pg   MCHC 29.9 (L) 30.0 - 36.0 g/dL   RDW 16.8 (H) 11.5 - 15.5 %   Platelets 229 150 - 400 K/uL   nRBC 0.0 0.0 - 0.2 %    Comment: Performed at [facility], [place] [address], [place], [place] [address]  TSH     Status: None   Collection Time: 08/15/22  6:07 AM  Result Value Ref Range   TSH 1.243 0.350 - 4.500 uIU/mL    Comment: Performed by a 3rd Generation assay with a functional sensitivity of <=0.01 uIU/mL. Performed at [facility], [place] [address], [place],  [address]    CT Angio Chest PE W and/or Wo Contrast  Result Date: 08/14/2022 CLINICAL DATA:  Chest and abdominal pain, initial encounter EXAM: CT ANGIOGRAPHY CHEST CT ABDOMEN AND PELVIS WITH CONTRAST TECHNIQUE: Multidetector CT imaging of the chest was performed using the standard protocol during bolus administration of intravenous contrast. Multiplanar CT image reconstructions and MIPs were obtained to  evaluate the vascular anatomy. Multidetector CT imaging of the abdomen and pelvis was performed using the standard protocol during bolus administration of intravenous contrast. RADIATION DOSE REDUCTION: This exam was performed according to the departmental dose-optimization program which includes automated exposure control, adjustment of the mA and/or kV according to patient size and/or use of iterative reconstruction technique. CONTRAST:  37m OMNIPAQUE IOHEXOL 350 MG/ML SOLN COMPARISON:  None Available. FINDINGS: CTA CHEST FINDINGS Cardiovascular: Thoracic aorta shows atherosclerotic calcifications without aneurysmal dilatation or dissection. No cardiac enlargement is noted. Mild coronary calcifications are seen. The pulmonary artery shows  a branching pattern bilaterally. Filling defect to suggest pulmonary embolism is noted. Mediastinum/Nodes: Thoracic inlet shows a dominant 2.5 cm nodule in the right lobe of the thyroid. By history this is been previously biopsied and no further follow-up is recommended. Left thyroidectomy is noted. No hilar or mediastinal adenopathy is noted. Fluid is noted within the esophagus likely related to reflux. A large hiatal hernia is noted. Lungs/Pleura: Mild basilar atelectasis is noted in the lungs bilaterally. No focal infiltrate or effusion is seen. No parenchymal nodules are noted. Musculoskeletal: Degenerative changes of the thoracic spine are noted. No acute rib abnormality is noted. Review of the MIP images confirms the above findings. CT ABDOMEN and PELVIS FINDINGS Hepatobiliary: No focal liver abnormality is seen. Status post cholecystectomy. No biliary dilatation. Pancreas: Unremarkable. No pancreatic ductal dilatation or surrounding inflammatory changes. Spleen: Normal in size without focal abnormality. Adrenals/Urinary Tract: Adrenal glands are within normal limits. Kidneys demonstrate a normal enhancement pattern bilaterally. Small cysts are noted bilaterally which  appear simple in nature stable from prior MRI examination. No further follow-up is recommended. No calculi or obstructive changes are seen. Delayed images demonstrate normal excretion of contrast material. The bladder is partially distended. Stomach/Bowel: Scattered diverticular changes noted with wall thickening. No evidence of diverticulitis is seen. The appendix has been surgically removed. Small bowel and stomach are within normal limits aside from the large hiatal hernia. Vascular/Lymphatic: Aortic atherosclerosis. No enlarged abdominal or pelvic lymph nodes. Reproductive: Status post hysterectomy. No adnexal masses. Other: No abdominal wall hernia or abnormality. No abdominopelvic ascites. Musculoskeletal: Multilevel degenerative changes are noted. Review of the MIP images confirms the above findings. IMPRESSION: CTA of the chest: No evidence of aortic dissection or aneurysmal dilatation. No evidence of pulmonary emboli. Dominant right thyroid nodule stable from recent ultrasound examination. This has been evaluated on previous imaging. (ref: J Am Coll Radiol. 2015 Feb;12(2): 143-50). CT of the abdomen and pelvis: Large hiatal hernia. Diverticulosis without diverticulitis. No other focal abnormality is noted. Electronically Signed   By: Inez Catalina M.D.   On: 08/14/2022 20:07   CT ABDOMEN PELVIS W CONTRAST  Result Date: 08/14/2022 CLINICAL DATA:  Chest and abdominal pain, initial encounter EXAM: CT ANGIOGRAPHY CHEST CT ABDOMEN AND PELVIS WITH CONTRAST TECHNIQUE: Multidetector CT imaging of the chest was performed using the standard protocol during bolus administration of intravenous contrast. Multiplanar CT image reconstructions and MIPs were obtained to evaluate the vascular anatomy. Multidetector CT imaging of the abdomen and pelvis was performed using the standard protocol during bolus administration of intravenous contrast. RADIATION DOSE REDUCTION: This exam was performed according to the departmental  dose-optimization program which includes automated exposure control, adjustment of the mA and/or kV according to patient size and/or use of iterative reconstruction technique. CONTRAST:  45m OMNIPAQUE IOHEXOL 350 MG/ML SOLN COMPARISON:  None Available. FINDINGS: CTA CHEST FINDINGS Cardiovascular: Thoracic aorta shows atherosclerotic calcifications without aneurysmal dilatation or dissection. No cardiac enlargement is noted. Mild coronary calcifications are seen. The pulmonary artery shows a branching pattern bilaterally. Filling defect to suggest pulmonary embolism is noted. Mediastinum/Nodes: Thoracic inlet shows a dominant 2.5 cm nodule in the right lobe of the thyroid. By history this is been previously biopsied and no further follow-up is recommended. Left thyroidectomy is noted. No hilar or mediastinal adenopathy is noted. Fluid is noted within the esophagus likely related to reflux. A large hiatal hernia is noted. Lungs/Pleura: Mild basilar atelectasis is noted in the lungs bilaterally. No focal infiltrate or effusion is seen. No parenchymal  nodules are noted. Musculoskeletal: Degenerative changes of the thoracic spine are noted. No acute rib abnormality is noted. Review of the MIP images confirms the above findings. CT ABDOMEN and PELVIS FINDINGS Hepatobiliary: No focal liver abnormality is seen. Status post cholecystectomy. No biliary dilatation. Pancreas: Unremarkable. No pancreatic ductal dilatation or surrounding inflammatory changes. Spleen: Normal in size without focal abnormality. Adrenals/Urinary Tract: Adrenal glands are within normal limits. Kidneys demonstrate a normal enhancement pattern bilaterally. Small cysts are noted bilaterally which appear simple in nature stable from prior MRI examination. No further follow-up is recommended. No calculi or obstructive changes are seen. Delayed images demonstrate normal excretion of contrast material. The bladder is partially distended. Stomach/Bowel:  Scattered diverticular changes noted with wall thickening. No evidence of diverticulitis is seen. The appendix has been surgically removed. Small bowel and stomach are within normal limits aside from the large hiatal hernia. Vascular/Lymphatic: Aortic atherosclerosis. No enlarged abdominal or pelvic lymph nodes. Reproductive: Status post hysterectomy. No adnexal masses. Other: No abdominal wall hernia or abnormality. No abdominopelvic ascites. Musculoskeletal: Multilevel degenerative changes are noted. Review of the MIP images confirms the above findings. IMPRESSION: CTA of the chest: No evidence of aortic dissection or aneurysmal dilatation. No evidence of pulmonary emboli. Dominant right thyroid nodule stable from recent ultrasound examination. This has been evaluated on previous imaging. (ref: J Am Coll Radiol. 2015 Feb;12(2): 143-50). CT of the abdomen and pelvis: Large hiatal hernia. Diverticulosis without diverticulitis. No other focal abnormality is noted. Electronically Signed   By: Inez Catalina M.D.   On: 08/14/2022 20:07      Assessment/Plan Acute onset nausea/vomiting  Hiatal hernia (HH) without CT evidence of volvulus or obstruction  - afebrile, VSS, WBC WNL  - no clear cause of her nausea/vomiting. No signs/sxs of enteritis or GI infection. No obvious obstruction from [facility] on imaging but will order UGI study to further assess. Will also consult GI [facility]) for upper endoscopy.  - CCS will follow, no role for emergent surgery today.   FEN - ok for CLD VTE - SCD's, ok for DVT ppx from ccs standpoint ID - none Admit - TRH service    Hyperparathyroidism - history of right parathyroidectomy and left thyroid lobectomy; has a 1.5 cm nodule inferior pole R thyroid that would benefit from FNA biopsy w/ AFIRMA testing. I will ask Dr. Harlow Asa about this so we can get it scheduled.   Hypothyroidism  Glaucoma  Arthritis GERD Urinary incontinence   I reviewed nursing notes, hospitalist notes,  last 24 h vitals and pain scores, last 48 h intake and output, last 24 h labs and trends, and last 24 h imaging results.  Jill Alexanders, PA-C [facility] 08/15/2022, 3:06 PM Please see Amion for pager number during day hours 7:00am-4:30pm or 7:00am -11:30am on weekends

## 2022-08-15 NOTE — ED Notes (Signed)
Went to give patient ordered PO pepcid and discharge paperwork and upon sitting up to take pill, patient began vomiting again. Notified Darl Householder of this, ordered received. Discharge now pending.

## 2022-08-15 NOTE — ED Notes (Signed)
Patient vomited at this time again, notified Yao MD.

## 2022-08-15 NOTE — ED Notes (Signed)
ED TO INPATIENT HANDOFF REPORT  ED Nurse Name and Phone #: Janett Billow RN  S Name/Age/Gender Lynn Jenkins 77 y.o. female Room/Bed: WA20/WA20  Code Status   Code Status: Full Code  Home/SNF/Other Home Patient oriented to: self, place, time, and situation Is this baseline? Yes   Triage Complete: Triage complete  Chief Complaint Intractable vomiting [R11.10]  Triage Note Generalized abd pain with N/v and dizziness x2 days.  Referred to ER by PCP.  Denies cp and sob Cholecystectomy 03/11/22   Allergies Allergies  Allergen Reactions   Doxycycline Swelling    Facial swelling, itching   Morphine And Related Swelling   Other Swelling   Zomig [Zolmitriptan] Nausea And Vomiting    Increased heart rate    Augmentin [Amoxicillin-Pot Clavulanate] Diarrhea   Ceftin [Cefuroxime Axetil] Other (See Comments)    Stomach ache   Gabapentin Other (See Comments)    Confusion, off balance   Methazolamide Diarrhea   Biaxin [Clarithromycin] Rash   Cefdinir Rash   Sumatriptan Other (See Comments)    Can use brand Imitrex    Level of Care/Admitting Diagnosis ED Disposition     ED Disposition  Admit   Condition  --   Comment  Hospital Area: [place] [[id number]]  Level of Care: Med-Surg [16]  May place patient in observation at [facility] [address] or [place] if equivalent level of care is available:: Yes  Covid Evaluation: Asymptomatic - no recent exposure (last 10 days) testing not required  Diagnosis: Intractable vomiting [id number]  Admitting Physician: Merrily Pew  Attending Physician: Merrily Pew          B Medical/Surgery History Past Medical History:  Diagnosis Date   Anemia    Arthritis    Generalized anxiety disorder    GERD (gastroesophageal reflux disease)    Glaucoma    Headache    hx of migraines    History of hiatal hernia    Hyperthyroidism    Lupus ([place])    Migraine    Mixed hyperlipidemia    Multinodular goiter     Osteopenia    Primary hyperparathyroidism ([place])    Varicose veins    Vitamin D deficiency    Past Surgical History:  Procedure Laterality Date   ABDOMINAL HYSTERECTOMY     APPENDECTOMY     CHOLECYSTECTOMY N/A 03/11/2022   Procedure: LAPAROSCOPIC CHOLECYSTECTOMY WITH icg dye;  Surgeon: Clovis Riley, MD;  Location: [facility];  Service: General;  Laterality: N/A;   JOINT REPLACEMENT     left foot surgery      PARATHYROIDECTOMY N/A 11/01/2014   Procedure: NECK EXPLORATION AND PARATHYROIDECTOMY;  Surgeon: Armandina Gemma, MD;  Location: [facility];  Service: General;  Laterality: N/A;   PARATHYROIDECTOMY N/A 10/31/2020   Procedure: PARATHYROIDECTOMY WITH LEFT THYROID LOBECTOMY;  Surgeon: Armandina Gemma, MD;  Location: [facility];  Service: General;  Laterality: N/A;   SHOULDER SURGERY       A IV Location/Drains/Wounds Patient Lines/Drains/Airways Status     Active Line/Drains/Airways     Name Placement date Placement time Site Days   Peripheral IV 08/14/22 20 G Left Antecubital 08/14/22  1747  Antecubital  1   External Urinary Catheter 08/14/22  2302  --  1   Incision (Closed) 03/11/22 Abdomen Other (Comment) 03/11/22  1137  -- 157   Incision - 4 Ports Abdomen Umbilicus Right;Lateral Right;Upper Left;Upper 03/11/22  1112  -- 157  Intake/Output Last 24 hours  Intake/Output Summary (Last 24 hours) at 08/15/2022 0044 Last data filed at 08/14/2022 2232 Gross per 24 hour  Intake 2100 ml  Output --  Net 2100 ml    Labs/Imaging Results for orders placed or performed during the hospital encounter of 08/14/22 (from the past 48 hour(s))  CBC with Differential     Status: Abnormal   Collection Time: 08/14/22  5:20 PM  Result Value Ref Range   WBC 7.2 4.0 - 10.5 K/uL   RBC 4.61 3.87 - 5.11 MIL/uL   Hemoglobin 11.6 (L) 12.0 - 15.0 g/dL   HCT 38.6 36.0 - 46.0 %   MCV 83.7 80.0 - 100.0 fL   MCH 25.2 (L) 26.0 - 34.0 pg   MCHC 30.1 30.0 - 36.0 g/dL   RDW 17.2 (H) 11.5 - 15.5 %    Platelets 248 150 - 400 K/uL   nRBC 0.0 0.0 - 0.2 %   Neutrophils Relative % 70 %   Neutro Abs 5.0 1.7 - 7.7 K/uL   Lymphocytes Relative 20 %   Lymphs Abs 1.5 0.7 - 4.0 K/uL   Monocytes Relative 8 %   Monocytes Absolute 0.6 0.1 - 1.0 K/uL   Eosinophils Relative 1 %   Eosinophils Absolute 0.1 0.0 - 0.5 K/uL   Basophils Relative 1 %   Basophils Absolute 0.1 0.0 - 0.1 K/uL   Immature Granulocytes 0 %   Abs Immature Granulocytes 0.02 0.00 - 0.07 K/uL    Comment: Performed at [facility], [place] [address], [place], [place] [address]  Comprehensive metabolic panel     Status: Abnormal   Collection Time: 08/14/22  5:20 PM  Result Value Ref Range   Sodium 137 135 - 145 mmol/L   Potassium 3.9 3.5 - 5.1 mmol/L   Chloride 108 98 - 111 mmol/L   CO2 26 22 - 32 mmol/L   Glucose, Bld 105 (H) 70 - 99 mg/dL    Comment: Glucose reference range applies only to samples taken after fasting for at least 8 hours.   BUN 18 8 - 23 mg/dL   Creatinine, Ser 1.16 (H) 0.44 - 1.00 mg/dL   Calcium 11.2 (H) 8.9 - 10.3 mg/dL   Total Protein 7.7 6.5 - 8.1 g/dL   Albumin 4.0 3.5 - 5.0 g/dL   AST 18 15 - 41 U/L   ALT 16 0 - 44 U/L   Alkaline Phosphatase 111 38 - 126 U/L   Total Bilirubin 0.8 0.3 - 1.2 mg/dL   GFR, Estimated 49 (L) >60 mL/min    Comment: (NOTE) Calculated using the CKD-EPI Creatinine Equation (2021)    Anion gap 3 (L) 5 - 15    Comment: Performed at [facility], [place] [address], [place], [place] [address]  Lipase, blood     Status: None   Collection Time: 08/14/22  5:20 PM  Result Value Ref Range   Lipase 33 11 - 51 U/L    Comment: Performed at [facility], [place] [address] [place], [place] [address]  Troponin I (High Sensitivity)     Status: None   Collection Time: 08/14/22  5:20 PM  Result Value Ref Range   Troponin I (High Sensitivity) 5 <18 ng/L    Comment: (NOTE) Elevated high sensitivity troponin I (hsTnI) values and  significant  changes across serial measurements may suggest ACS but many other  chronic and acute conditions are known to elevate hsTnI results.  Refer to  the "Links" section for chest pain algorithms and additional  guidance. Performed at [facility], [place] [address], [place], [place] [address]   Troponin I (High Sensitivity)     Status: None   Collection Time: 08/14/22  7:18 PM  Result Value Ref Range   Troponin I (High Sensitivity) 5 <18 ng/L    Comment: (NOTE) Elevated high sensitivity troponin I (hsTnI) values and significant  changes across serial measurements may suggest ACS but many other  chronic and acute conditions are known to elevate hsTnI results.  Refer to the "Links" section for chest pain algorithms and additional  guidance. Performed at [facility], [place] [address], [place], [place] [address]    CT Angio Chest PE W and/or Wo Contrast  Result Date: 08/14/2022 CLINICAL DATA:  Chest and abdominal pain, initial encounter EXAM: CT ANGIOGRAPHY CHEST CT ABDOMEN AND PELVIS WITH CONTRAST TECHNIQUE: Multidetector CT imaging of the chest was performed using the standard protocol during bolus administration of intravenous contrast. Multiplanar CT image reconstructions and MIPs were obtained to evaluate the vascular anatomy. Multidetector CT imaging of the abdomen and pelvis was performed using the standard protocol during bolus administration of intravenous contrast. RADIATION DOSE REDUCTION: This exam was performed according to the departmental dose-optimization program which includes automated exposure control, adjustment of the mA and/or kV according to patient size and/or use of iterative reconstruction technique. CONTRAST:  10m OMNIPAQUE IOHEXOL 350 MG/ML SOLN COMPARISON:  None Available. FINDINGS: CTA CHEST FINDINGS Cardiovascular: Thoracic aorta shows atherosclerotic calcifications without aneurysmal dilatation or dissection. No cardiac  enlargement is noted. Mild coronary calcifications are seen. The pulmonary artery shows a branching pattern bilaterally. Filling defect to suggest pulmonary embolism is noted. Mediastinum/Nodes: Thoracic inlet shows a dominant 2.5 cm nodule in the right lobe of the thyroid. By history this is been previously biopsied and no further follow-up is recommended. Left thyroidectomy is noted. No hilar or mediastinal adenopathy is noted. Fluid is noted within the esophagus likely related to reflux. A large hiatal hernia is noted. Lungs/Pleura: Mild basilar atelectasis is noted in the lungs bilaterally. No focal infiltrate or effusion is seen. No parenchymal nodules are noted. Musculoskeletal: Degenerative changes of the thoracic spine are noted. No acute rib abnormality is noted. Review of the MIP images confirms the above findings. CT ABDOMEN and PELVIS FINDINGS Hepatobiliary: No focal liver abnormality is seen. Status post cholecystectomy. No biliary dilatation. Pancreas: Unremarkable. No pancreatic ductal dilatation or surrounding inflammatory changes. Spleen: Normal in size without focal abnormality. Adrenals/Urinary Tract: Adrenal glands are within normal limits. Kidneys demonstrate a normal enhancement pattern bilaterally. Small cysts are noted bilaterally which appear simple in nature stable from prior MRI examination. No further follow-up is recommended. No calculi or obstructive changes are seen. Delayed images demonstrate normal excretion of contrast material. The bladder is partially distended. Stomach/Bowel: Scattered diverticular changes noted with wall thickening. No evidence of diverticulitis is seen. The appendix has been surgically removed. Small bowel and stomach are within normal limits aside from the large hiatal hernia. Vascular/Lymphatic: Aortic atherosclerosis. No enlarged abdominal or pelvic lymph nodes. Reproductive: Status post hysterectomy. No adnexal masses. Other: No abdominal wall hernia or  abnormality. No abdominopelvic ascites. Musculoskeletal: Multilevel degenerative changes are noted. Review of the MIP images confirms the above findings. IMPRESSION: CTA of the chest: No evidence of aortic dissection or aneurysmal dilatation. No evidence of pulmonary emboli. Dominant right thyroid nodule stable from recent ultrasound examination. This has been evaluated on previous imaging. (ref: J  Am Coll Radiol. 2015 Feb;12(2): 143-50). CT of the abdomen and pelvis: Large hiatal hernia. Diverticulosis without diverticulitis. No other focal abnormality is noted. Electronically Signed   By: Inez Catalina M.D.   On: 08/14/2022 20:07   CT ABDOMEN PELVIS W CONTRAST  Result Date: 08/14/2022 CLINICAL DATA:  Chest and abdominal pain, initial encounter EXAM: CT ANGIOGRAPHY CHEST CT ABDOMEN AND PELVIS WITH CONTRAST TECHNIQUE: Multidetector CT imaging of the chest was performed using the standard protocol during bolus administration of intravenous contrast. Multiplanar CT image reconstructions and MIPs were obtained to evaluate the vascular anatomy. Multidetector CT imaging of the abdomen and pelvis was performed using the standard protocol during bolus administration of intravenous contrast. RADIATION DOSE REDUCTION: This exam was performed according to the departmental dose-optimization program which includes automated exposure control, adjustment of the mA and/or kV according to patient size and/or use of iterative reconstruction technique. CONTRAST:  16m OMNIPAQUE IOHEXOL 350 MG/ML SOLN COMPARISON:  None Available. FINDINGS: CTA CHEST FINDINGS Cardiovascular: Thoracic aorta shows atherosclerotic calcifications without aneurysmal dilatation or dissection. No cardiac enlargement is noted. Mild coronary calcifications are seen. The pulmonary artery shows a branching pattern bilaterally. Filling defect to suggest pulmonary embolism is noted. Mediastinum/Nodes: Thoracic inlet shows a dominant 2.5 cm nodule in the right  lobe of the thyroid. By history this is been previously biopsied and no further follow-up is recommended. Left thyroidectomy is noted. No hilar or mediastinal adenopathy is noted. Fluid is noted within the esophagus likely related to reflux. A large hiatal hernia is noted. Lungs/Pleura: Mild basilar atelectasis is noted in the lungs bilaterally. No focal infiltrate or effusion is seen. No parenchymal nodules are noted. Musculoskeletal: Degenerative changes of the thoracic spine are noted. No acute rib abnormality is noted. Review of the MIP images confirms the above findings. CT ABDOMEN and PELVIS FINDINGS Hepatobiliary: No focal liver abnormality is seen. Status post cholecystectomy. No biliary dilatation. Pancreas: Unremarkable. No pancreatic ductal dilatation or surrounding inflammatory changes. Spleen: Normal in size without focal abnormality. Adrenals/Urinary Tract: Adrenal glands are within normal limits. Kidneys demonstrate a normal enhancement pattern bilaterally. Small cysts are noted bilaterally which appear simple in nature stable from prior MRI examination. No further follow-up is recommended. No calculi or obstructive changes are seen. Delayed images demonstrate normal excretion of contrast material. The bladder is partially distended. Stomach/Bowel: Scattered diverticular changes noted with wall thickening. No evidence of diverticulitis is seen. The appendix has been surgically removed. Small bowel and stomach are within normal limits aside from the large hiatal hernia. Vascular/Lymphatic: Aortic atherosclerosis. No enlarged abdominal or pelvic lymph nodes. Reproductive: Status post hysterectomy. No adnexal masses. Other: No abdominal wall hernia or abnormality. No abdominopelvic ascites. Musculoskeletal: Multilevel degenerative changes are noted. Review of the MIP images confirms the above findings. IMPRESSION: CTA of the chest: No evidence of aortic dissection or aneurysmal dilatation. No evidence of  pulmonary emboli. Dominant right thyroid nodule stable from recent ultrasound examination. This has been evaluated on previous imaging. (ref: J Am Coll Radiol. 2015 Feb;12(2): 143-50). CT of the abdomen and pelvis: Large hiatal hernia. Diverticulosis without diverticulitis. No other focal abnormality is noted. Electronically Signed   By: MInez CatalinaM.D.   On: 08/14/2022 20:07    Pending Labs Unresulted Labs (From admission, onward)     Start     Ordered   08/21/22 0500  Creatinine, serum  (enoxaparin (LOVENOX)    CrCl < 30 ml/min)  Once,   R       Comments:  while on enoxaparin therapy.    08/14/22 2325   08/15/22 [id number]  Basic metabolic panel  Tomorrow morning,   R        08/14/22 2325   08/15/22 0500  CBC  Tomorrow morning,   R        08/14/22 2325   08/14/22 2333  TSH  Add-on,   AD        08/14/22 2332            Vitals/Pain Today's Vitals   08/14/22 2200 08/14/22 2330 08/14/22 2343 08/15/22 0000  BP: (!) 163/98 (!) 165/97  (!) 169/90  Pulse: 70 77  72  Resp: 12   17  Temp:      TempSrc:      SpO2: 100% 100%  100%  Weight:   91 kg   Height:   '5\' 5"'$  (1.651 m)   PainSc:        Isolation Precautions No active isolations  Medications Medications  promethazine (PHENERGAN) 12.5 mg in sodium chloride 0.9 % 50 mL IVPB (0 mg Intravenous Stopped 08/14/22 2232)  latanoprost (XALATAN) 0.005 % ophthalmic solution 1 drop (has no administration in time range)  enoxaparin (LOVENOX) injection 40 mg (has no administration in time range)  dextrose 5 % in lactated ringers infusion (has no administration in time range)  fentaNYL (SUBLIMAZE) injection 12.5-50 mcg (has no administration in time range)  scopolamine (TRANSDERM-SCOP) 1 MG/3DAYS 1.5 mg (1.5 mg Transdermal Patch Applied 08/15/22 0026)  ondansetron (ZOFRAN) injection 4 mg (4 mg Intravenous Given 08/15/22 0025)  metoCLOPramide (REGLAN) injection 10 mg (10 mg Intravenous Given 08/15/22 0025)  metoprolol tartrate (LOPRESSOR) injection 5  mg (has no administration in time range)  ondansetron (ZOFRAN) injection 4 mg (4 mg Intravenous Given 08/14/22 1748)  HYDROmorphone (DILAUDID) injection 1 mg (1 mg Intravenous Given 08/14/22 1748)  sodium chloride 0.9 % bolus 1,000 mL (0 mLs Intravenous Stopped 08/14/22 1915)  metoCLOPramide (REGLAN) injection 10 mg (10 mg Intravenous Given 08/14/22 1918)  diphenhydrAMINE (BENADRYL) injection 25 mg (25 mg Intravenous Given 08/14/22 1917)  iohexol (OMNIPAQUE) 350 MG/ML injection 80 mL (80 mLs Intravenous Contrast Given 08/14/22 1944)  famotidine (PEPCID) IVPB 20 mg premix (0 mg Intravenous Stopped 08/14/22 2150)  sodium chloride 0.9 % bolus 1,000 mL (0 mLs Intravenous Stopped 08/14/22 2220)    Mobility Walks at home, has been vomiting multiple times so has not ambulated here      Focused Assessments Neuro Assessment Handoff:  Swallow screen pass? Yes          Neuro Assessment: Exceptions to [facility] Neuro Checks:      Has TPA been given? No If patient is a Neuro Trauma and patient is going to OR before floor call report to [place] nurse: [phone] or [phone]   R Recommendations: See Admitting Provider Note  Report given to:   Additional Notes:

## 2022-08-15 NOTE — TOC Progression Note (Signed)
Transition of Care [facility]) - Progression Note    Patient Details  Name: Lynn Jenkins MRN: [id number] Date of Birth: 08/15/1945  Transition of Care [facility]) CM/SW [place], RN Phone [phone]  08/15/2022, 3:31 PM  Clinical Narrative:     Transition of Care [facility]) Screening Note   Patient Details  Name: Lynn Jenkins Date of Birth: [birth year]   Transition of Care [facility]) CM/SW Contact:    Angelita Ingles, RN Phone Number: 08/15/2022, 3:32 PM    Transition of Care Department [facility]) has reviewed patient and no TOC needs have been identified at this time. We will continue to monitor patient advancement through interdisciplinary progression rounds. If new patient transition needs arise, please place a TOC consult.          Expected Discharge Plan and Services                                               Social Determinants of Health (SDOH) Interventions SDOH Screenings   Food Insecurity: No Food Insecurity (08/15/2022)  Housing: Low Risk  (08/15/2022)  Transportation Needs: No Transportation Needs (08/15/2022)  Utilities: Not At Risk (08/15/2022)  Tobacco Use: Low Risk  (08/14/2022)    Readmission Risk Interventions     No data to display

## 2022-08-15 NOTE — Plan of Care (Signed)

## 2022-08-15 NOTE — Progress Notes (Signed)
PROGRESS NOTE    Lynn Jenkins  [id number] DOB: 1946-04-12 DOA: 08/14/2022 PCP: Lawerance Cruel, MD    Brief Narrative:  77 year old with history of rheumatoid arthritis, hyperparathyroidism, hypothyroidism, glaucoma, GERD, status postcholecystectomy who does have occasional nausea relieved by Reglan but severe persistent symptoms for the last 2 days so came to the emergency room.  Multiple medications in the emergency room without significant relief so admitted to the hospital.  Electrolytes normal.  On room air.  CT scan with large hiatal hernia.   Assessment & Plan:   Acute intractable nausea and vomiting in a patient with chronic nausea: Probably secondary to her hiatal hernia. N.p.o., IV fluids, currently not tolerating any liquids challenge. Zofran every 6 hours scheduled, metoclopramide every 6 hours scheduled. Scopolamine patch Pepcid 20 mg IV twice daily. EKG reviewed 3/6 with QTc of 455. If persistent symptoms, will discuss with surgery if she needs surgical intervention.  Chronic medical issues including Hyperparathyroidism, on Sensipar Hypothyroidism, on Synthroid     DVT prophylaxis: enoxaparin (LOVENOX) injection 40 mg Start: 08/15/22 1000   Code Status: Full code Family Communication: None at the bedside Disposition Plan: Status is: Observation The patient will require care spanning > 2 midnights and should be moved to inpatient because: Intractable nausea vomiting, not tolerating any fluid challenge.     Consultants:  None  Procedures:  None  Antimicrobials:  None   Subjective: Patient seen and examined.  Anxious because of persistent feeling of nausea.  She could not tolerate sips of water.  She had bilious vomiting.  Objective: Vitals:   08/15/22 0051 08/15/22 0120 08/15/22 0524 08/15/22 0911  BP:  (!) 159/86 (!) 172/96 137/85  Pulse:  65 72 69  Resp:  '18 18 18  '$ Temp: 97.7 F (36.5 C) 98.2 F (36.8 C) 97.6 F (36.4 C) 97.8 F (36.6  C)  TempSrc: Oral Oral Oral Oral  SpO2:  91% 99% 91%  Weight:  89.9 kg    Height:        Intake/Output Summary (Last 24 hours) at 08/15/2022 1134 Last data filed at 08/15/2022 0700 Gross per 24 hour  Intake 2491.29 ml  Output 1625 ml  Net 866.29 ml   Filed Weights   08/14/22 1708 08/14/22 2343 08/15/22 0120  Weight: 91 kg 91 kg 89.9 kg    Examination:  General exam: Appears calm and comfortable at rest.  Appropriately anxious. Respiratory system: No added sounds. Cardiovascular system: S1 & S2 heard, RRR.  Gastrointestinal system: Soft.  Mild epigastric tenderness. Central nervous system: Alert and oriented. No focal neurological deficits. Extremities: Symmetric 5 x 5 power. Skin: No rashes, lesions or ulcers  Data Reviewed: I have personally reviewed following labs and imaging studies  CBC: Recent Labs  Lab 08/14/22 1720 08/15/22 0607  WBC 7.2 9.0  NEUTROABS 5.0  --   HGB 11.6* 11.2*  HCT 38.6 37.5  MCV 83.7 84.1  PLT 248 [id number]   Basic Metabolic Panel: Recent Labs  Lab 08/14/22 1720 08/15/22 0607  NA 137 134*  K 3.9 3.7  CL 108 105  CO2 26 23  GLUCOSE 105* 175*  BUN 18 13  CREATININE 1.16* 0.91  CALCIUM 11.2* 10.1   GFR: Estimated Creatinine Clearance: 59.2 mL/min (by C-G formula based on SCr of 0.91 mg/dL). Liver Function Tests: Recent Labs  Lab 08/14/22 1720  AST 18  ALT 16  ALKPHOS 111  BILITOT 0.8  PROT 7.7  ALBUMIN 4.0   Recent Labs  Lab 08/14/22 1720  LIPASE 33   No results for input(s): "AMMONIA" in the last 168 hours. Coagulation Profile: No results for input(s): "INR", "PROTIME" in the last 168 hours. Cardiac Enzymes: No results for input(s): "CKTOTAL", "CKMB", "CKMBINDEX", "TROPONINI" in the last 168 hours. BNP (last 3 results) No results for input(s): "PROBNP" in the last 8760 hours. HbA1C: No results for input(s): "HGBA1C" in the last 72 hours. CBG: No results for input(s): "GLUCAP" in the last 168 hours. Lipid Profile: No  results for input(s): "CHOL", "HDL", "LDLCALC", "TRIG", "CHOLHDL", "LDLDIRECT" in the last 72 hours. Thyroid Function Tests: Recent Labs    08/15/22 0607  TSH 1.243   Anemia Panel: No results for input(s): "VITAMINB12", "FOLATE", "FERRITIN", "TIBC", "IRON", "RETICCTPCT" in the last 72 hours. Sepsis Labs: No results for input(s): "PROCALCITON", "LATICACIDVEN" in the last 168 hours.  No results found for this or any previous visit (from the past 240 hour(s)).       Radiology Studies: CT Angio Chest PE W and/or Wo Contrast  Result Date: 08/14/2022 CLINICAL DATA:  Chest and abdominal pain, initial encounter EXAM: CT ANGIOGRAPHY CHEST CT ABDOMEN AND PELVIS WITH CONTRAST TECHNIQUE: Multidetector CT imaging of the chest was performed using the standard protocol during bolus administration of intravenous contrast. Multiplanar CT image reconstructions and MIPs were obtained to evaluate the vascular anatomy. Multidetector CT imaging of the abdomen and pelvis was performed using the standard protocol during bolus administration of intravenous contrast. RADIATION DOSE REDUCTION: This exam was performed according to the departmental dose-optimization program which includes automated exposure control, adjustment of the mA and/or kV according to patient size and/or use of iterative reconstruction technique. CONTRAST:  20m OMNIPAQUE IOHEXOL 350 MG/ML SOLN COMPARISON:  None Available. FINDINGS: CTA CHEST FINDINGS Cardiovascular: Thoracic aorta shows atherosclerotic calcifications without aneurysmal dilatation or dissection. No cardiac enlargement is noted. Mild coronary calcifications are seen. The pulmonary artery shows a branching pattern bilaterally. Filling defect to suggest pulmonary embolism is noted. Mediastinum/Nodes: Thoracic inlet shows a dominant 2.5 cm nodule in the right lobe of the thyroid. By history this is been previously biopsied and no further follow-up is recommended. Left thyroidectomy is  noted. No hilar or mediastinal adenopathy is noted. Fluid is noted within the esophagus likely related to reflux. A large hiatal hernia is noted. Lungs/Pleura: Mild basilar atelectasis is noted in the lungs bilaterally. No focal infiltrate or effusion is seen. No parenchymal nodules are noted. Musculoskeletal: Degenerative changes of the thoracic spine are noted. No acute rib abnormality is noted. Review of the MIP images confirms the above findings. CT ABDOMEN and PELVIS FINDINGS Hepatobiliary: No focal liver abnormality is seen. Status post cholecystectomy. No biliary dilatation. Pancreas: Unremarkable. No pancreatic ductal dilatation or surrounding inflammatory changes. Spleen: Normal in size without focal abnormality. Adrenals/Urinary Tract: Adrenal glands are within normal limits. Kidneys demonstrate a normal enhancement pattern bilaterally. Small cysts are noted bilaterally which appear simple in nature stable from prior MRI examination. No further follow-up is recommended. No calculi or obstructive changes are seen. Delayed images demonstrate normal excretion of contrast material. The bladder is partially distended. Stomach/Bowel: Scattered diverticular changes noted with wall thickening. No evidence of diverticulitis is seen. The appendix has been surgically removed. Small bowel and stomach are within normal limits aside from the large hiatal hernia. Vascular/Lymphatic: Aortic atherosclerosis. No enlarged abdominal or pelvic lymph nodes. Reproductive: Status post hysterectomy. No adnexal masses. Other: No abdominal wall hernia or abnormality. No abdominopelvic ascites. Musculoskeletal: Multilevel degenerative changes are noted. Review of the MIP images confirms  the above findings. IMPRESSION: CTA of the chest: No evidence of aortic dissection or aneurysmal dilatation. No evidence of pulmonary emboli. Dominant right thyroid nodule stable from recent ultrasound examination. This has been evaluated on previous  imaging. (ref: J Am Coll Radiol. 2015 Feb;12(2): 143-50). CT of the abdomen and pelvis: Large hiatal hernia. Diverticulosis without diverticulitis. No other focal abnormality is noted. Electronically Signed   By: Inez Catalina M.D.   On: 08/14/2022 20:07   CT ABDOMEN PELVIS W CONTRAST  Result Date: 08/14/2022 CLINICAL DATA:  Chest and abdominal pain, initial encounter EXAM: CT ANGIOGRAPHY CHEST CT ABDOMEN AND PELVIS WITH CONTRAST TECHNIQUE: Multidetector CT imaging of the chest was performed using the standard protocol during bolus administration of intravenous contrast. Multiplanar CT image reconstructions and MIPs were obtained to evaluate the vascular anatomy. Multidetector CT imaging of the abdomen and pelvis was performed using the standard protocol during bolus administration of intravenous contrast. RADIATION DOSE REDUCTION: This exam was performed according to the departmental dose-optimization program which includes automated exposure control, adjustment of the mA and/or kV according to patient size and/or use of iterative reconstruction technique. CONTRAST:  65m OMNIPAQUE IOHEXOL 350 MG/ML SOLN COMPARISON:  None Available. FINDINGS: CTA CHEST FINDINGS Cardiovascular: Thoracic aorta shows atherosclerotic calcifications without aneurysmal dilatation or dissection. No cardiac enlargement is noted. Mild coronary calcifications are seen. The pulmonary artery shows a branching pattern bilaterally. Filling defect to suggest pulmonary embolism is noted. Mediastinum/Nodes: Thoracic inlet shows a dominant 2.5 cm nodule in the right lobe of the thyroid. By history this is been previously biopsied and no further follow-up is recommended. Left thyroidectomy is noted. No hilar or mediastinal adenopathy is noted. Fluid is noted within the esophagus likely related to reflux. A large hiatal hernia is noted. Lungs/Pleura: Mild basilar atelectasis is noted in the lungs bilaterally. No focal infiltrate or effusion is seen.  No parenchymal nodules are noted. Musculoskeletal: Degenerative changes of the thoracic spine are noted. No acute rib abnormality is noted. Review of the MIP images confirms the above findings. CT ABDOMEN and PELVIS FINDINGS Hepatobiliary: No focal liver abnormality is seen. Status post cholecystectomy. No biliary dilatation. Pancreas: Unremarkable. No pancreatic ductal dilatation or surrounding inflammatory changes. Spleen: Normal in size without focal abnormality. Adrenals/Urinary Tract: Adrenal glands are within normal limits. Kidneys demonstrate a normal enhancement pattern bilaterally. Small cysts are noted bilaterally which appear simple in nature stable from prior MRI examination. No further follow-up is recommended. No calculi or obstructive changes are seen. Delayed images demonstrate normal excretion of contrast material. The bladder is partially distended. Stomach/Bowel: Scattered diverticular changes noted with wall thickening. No evidence of diverticulitis is seen. The appendix has been surgically removed. Small bowel and stomach are within normal limits aside from the large hiatal hernia. Vascular/Lymphatic: Aortic atherosclerosis. No enlarged abdominal or pelvic lymph nodes. Reproductive: Status post hysterectomy. No adnexal masses. Other: No abdominal wall hernia or abnormality. No abdominopelvic ascites. Musculoskeletal: Multilevel degenerative changes are noted. Review of the MIP images confirms the above findings. IMPRESSION: CTA of the chest: No evidence of aortic dissection or aneurysmal dilatation. No evidence of pulmonary emboli. Dominant right thyroid nodule stable from recent ultrasound examination. This has been evaluated on previous imaging. (ref: J Am Coll Radiol. 2015 Feb;12(2): 143-50). CT of the abdomen and pelvis: Large hiatal hernia. Diverticulosis without diverticulitis. No other focal abnormality is noted. Electronically Signed   By: MInez CatalinaM.D.   On: 08/14/2022 20:07         Scheduled Meds:  enoxaparin (LOVENOX) injection  40 mg Subcutaneous Q24H   latanoprost  1 drop Right Eye QHS   [START ON 08/16/2022] levothyroxine  75 mcg Oral Q0600   metoCLOPramide (REGLAN) injection  10 mg Intravenous Q6H   ondansetron (ZOFRAN) IV  4 mg Intravenous Q6H   scopolamine  1 patch Transdermal Q72H   Continuous Infusions:  dextrose 5% lactated ringers 100 mL/hr at 08/15/22 0539   famotidine (PEPCID) IV     promethazine (PHENERGAN) injection (IM or IVPB) Stopped (08/14/22 2232)     LOS: 0 days    Time spent: 35 minutes    Barb Merino, MD [facility] Pager [phone]

## 2022-08-16 ENCOUNTER — Inpatient Hospital Stay (HOSPITAL_COMMUNITY): Payer: Medicare Other

## 2022-08-16 LAB — CBC WITH DIFFERENTIAL/PLATELET
Abs Immature Granulocytes: 0.02 10*3/uL (ref 0.00–0.07)
Basophils Absolute: 0.1 10*3/uL (ref 0.0–0.1)
Basophils Relative: 1 %
Eosinophils Absolute: 0.1 10*3/uL (ref 0.0–0.5)
Eosinophils Relative: 2 %
HCT: 37.3 % (ref 36.0–46.0)
Hemoglobin: 11 g/dL — ABNORMAL LOW (ref 12.0–15.0)
Immature Granulocytes: 0 %
Lymphocytes Relative: 23 %
Lymphs Abs: 1.6 10*3/uL (ref 0.7–4.0)
MCH: 25.2 pg — ABNORMAL LOW (ref 26.0–34.0)
MCHC: 29.5 g/dL — ABNORMAL LOW (ref 30.0–36.0)
MCV: 85.6 fL (ref 80.0–100.0)
Monocytes Absolute: 0.5 10*3/uL (ref 0.1–1.0)
Monocytes Relative: 8 %
Neutro Abs: 4.4 10*3/uL (ref 1.7–7.7)
Neutrophils Relative %: 66 %
Platelets: 223 10*3/uL (ref 150–400)
RBC: 4.36 MIL/uL (ref 3.87–5.11)
RDW: 16.8 % — ABNORMAL HIGH (ref 11.5–15.5)
WBC: 6.7 10*3/uL (ref 4.0–10.5)
nRBC: 0 % (ref 0.0–0.2)

## 2022-08-16 LAB — COMPREHENSIVE METABOLIC PANEL
ALT: 15 U/L (ref 0–44)
AST: 17 U/L (ref 15–41)
Albumin: 3.3 g/dL — ABNORMAL LOW (ref 3.5–5.0)
Alkaline Phosphatase: 93 U/L (ref 38–126)
Anion gap: 8 (ref 5–15)
BUN: 10 mg/dL (ref 8–23)
CO2: 26 mmol/L (ref 22–32)
Calcium: 10.7 mg/dL — ABNORMAL HIGH (ref 8.9–10.3)
Chloride: 106 mmol/L (ref 98–111)
Creatinine, Ser: 0.97 mg/dL (ref 0.44–1.00)
GFR, Estimated: >60 mL/min (ref >60)
Glucose, Bld: 113 mg/dL — ABNORMAL HIGH (ref 70–99)
Potassium: 3.7 mmol/L (ref 3.5–5.1)
Sodium: 140 mmol/L (ref 135–145)
Total Bilirubin: 0.5 mg/dL (ref 0.3–1.2)
Total Protein: 6.5 g/dL (ref 6.5–8.1)

## 2022-08-16 LAB — MAGNESIUM: Magnesium: 1.9 mg/dL (ref 1.7–2.4)

## 2022-08-16 LAB — PHOSPHORUS: Phosphorus: 2.1 mg/dL — ABNORMAL LOW (ref 2.5–4.6)

## 2022-08-16 MED ORDER — TIMOLOL MALEATE 0.5 % OP SOLN
1.0000 [drp] | Freq: Two times a day (BID) | OPHTHALMIC | Status: DC
  Administered 2022-08-16 – 2022-08-17 (×3): 1 [drp] via OPHTHALMIC
  Filled 2022-08-16: qty 5

## 2022-08-16 MED ORDER — K PHOS MONO-SOD PHOS DI & MONO 155-852-130 MG PO TABS
250.0000 mg | ORAL_TABLET | Freq: Three times a day (TID) | ORAL | Status: DC
  Administered 2022-08-16 – 2022-08-17 (×3): 250 mg via ORAL
  Filled 2022-08-16 (×5): qty 1

## 2022-08-16 MED ORDER — ONDANSETRON HCL 4 MG/2ML IJ SOLN: 4.0000 mg | Freq: Four times a day (QID) | INTRAMUSCULAR | Status: DC | PRN

## 2022-08-16 NOTE — Progress Notes (Signed)
PROGRESS NOTE    Lynn Jenkins  [id number] DOB: 05/12/1946 DOA: 08/14/2022 PCP: Lawerance Cruel, MD    Brief Narrative:  77 year old with history of rheumatoid arthritis, hyperparathyroidism, hypothyroidism, glaucoma, GERD, status postcholecystectomy who does have occasional nausea relieved by Reglan but severe persistent symptoms for the last 2 days so came to the emergency room.  Multiple medications in the emergency room without significant relief so admitted to the hospital.  Electrolytes normal.  On room air.  CT scan with large hiatal hernia.   Assessment & Plan:   Acute intractable nausea and vomiting in a patient with chronic nausea: Probably secondary to her hiatal hernia/esophageal dysmotility. Patient had severe symptoms and not tolerating liquids challenge.  She is on scheduled metoclopramide and Zofran along with a scopolamine patch. 3/8, some clinical improvement and ready for trial. Continue metoclopramide every 6 hours for now, will change Zofran to as needed.  Continue scopolamine patch. Seen by surgery, currently not anticipating any surgical need. Esophagogram with tortuous esophagus but no complications. Followed by GI, likely will need upper GI endoscopy.  If not planning endoscopy, will start with clear liquid challenge. Continue Pepcid.  Chronic medical issues including Hyperparathyroidism, on Sensipar Hypothyroidism, on Synthroid Anxiety, on sertraline.    DVT prophylaxis: enoxaparin (LOVENOX) injection 40 mg Start: 08/15/22 1000   Code Status: Full code Family Communication: None at the bedside Disposition Plan: Status is: Inpatient.  Intolerance to diet.  Inpatient procedures planned.   Consultants:  General surgery Gastroenterology  Procedures:  None  Antimicrobials:  None   Subjective:  Patient seen in the morning rounds.  Today she is more comfortable and denies any nausea.  She is still on round-the-clock nausea medications.  She  wants to try eating.  Objective: Vitals:   08/15/22 1321 08/15/22 2148 08/16/22 0556 08/16/22 1315  BP: (!) 151/97 (!) 150/88 (!) 158/87 (!) 161/94  Pulse: 67 74 60 76  Resp: '20 14 14 17  '$ Temp: 98 F (36.7 C) 98.6 F (37 C) 98.7 F (37.1 C) (!) 97.4 F (36.3 C)  TempSrc: Oral Oral Oral Oral  SpO2: 93% 90% 93% 95%  Weight:      Height:        Intake/Output Summary (Last 24 hours) at 08/16/2022 1322 Last data filed at 08/16/2022 0602 Gross per 24 hour  Intake 2383.12 ml  Output 700 ml  Net 1683.12 ml   Filed Weights   08/14/22 1708 08/14/22 2343 08/15/22 0120  Weight: 91 kg 91 kg 89.9 kg    Examination:  General exam: Appears calm and comfortable.  Not in any distress today. Respiratory system: No added sounds. Cardiovascular system: S1 & S2 heard, RRR.  Gastrointestinal system: Soft.  Nontender.  Bowel sound present. Central nervous system: Alert and oriented. No focal neurological deficits. Extremities: Symmetric 5 x 5 power. Skin: No rashes, lesions or ulcers  Data Reviewed: I have personally reviewed following labs and imaging studies  CBC: Recent Labs  Lab 08/14/22 1720 08/15/22 0607 08/16/22 0617  WBC 7.2 9.0 6.7  NEUTROABS 5.0  --  4.4  HGB 11.6* 11.2* 11.0*  HCT 38.6 37.5 37.3  MCV 83.7 84.1 85.6  PLT 248 229 [id number]   Basic Metabolic Panel: Recent Labs  Lab 08/14/22 1720 08/15/22 0607 08/16/22 0617  NA 137 134* 140  K 3.9 3.7 3.7  CL 108 105 106  CO2 '26 23 26  '$ GLUCOSE 105* 175* 113*  BUN '18 13 10  '$ CREATININE 1.16* 0.91 0.97  CALCIUM  11.2* 10.1 10.7*  MG  --   --  1.9  PHOS  --   --  2.1*   GFR: Estimated Creatinine Clearance: 55.5 mL/min (by C-G formula based on SCr of 0.97 mg/dL). Liver Function Tests: Recent Labs  Lab 08/14/22 1720 08/16/22 0617  AST 18 17  ALT 16 15  ALKPHOS 111 93  BILITOT 0.8 0.5  PROT 7.7 6.5  ALBUMIN 4.0 3.3*   Recent Labs  Lab 08/14/22 1720  LIPASE 33   No results for input(s): "AMMONIA" in the last  168 hours. Coagulation Profile: No results for input(s): "INR", "PROTIME" in the last 168 hours. Cardiac Enzymes: No results for input(s): "CKTOTAL", "CKMB", "CKMBINDEX", "TROPONINI" in the last 168 hours. BNP (last 3 results) No results for input(s): "PROBNP" in the last 8760 hours. HbA1C: No results for input(s): "HGBA1C" in the last 72 hours. CBG: No results for input(s): "GLUCAP" in the last 168 hours. Lipid Profile: No results for input(s): "CHOL", "HDL", "LDLCALC", "TRIG", "CHOLHDL", "LDLDIRECT" in the last 72 hours. Thyroid Function Tests: Recent Labs    08/15/22 0607  TSH 1.243   Anemia Panel: No results for input(s): "VITAMINB12", "FOLATE", "FERRITIN", "TIBC", "IRON", "RETICCTPCT" in the last 72 hours. Sepsis Labs: No results for input(s): "PROCALCITON", "LATICACIDVEN" in the last 168 hours.  No results found for this or any previous visit (from the past 240 hour(s)).       Radiology Studies: DG UGI W SINGLE CM (SOL OR THIN BA)  Result Date: 08/16/2022 CLINICAL DATA:  77 year old female history of hiatal hernia. Admitted for nausea and vomiting. Team is requesting upper GI for further evaluation for possible hiatal hernia repair EXAM: DG UGI W SINGLE CM TECHNIQUE: Scout radiograph was obtained. Single contrast examination was performed using thin liquid barium. This exam was performed by Rushie Nyhan NPand was supervised and interpreted by Dr. Suzy Bouchard FLUOROSCOPY: Radiation Exposure Index (as provided by the fluoroscopic device): 22.5 mGy Kerma COMPARISON:  None Available. FINDINGS: Scout Radiograph: Nonobstructive bowel gas pattern. Prior cholecystectomy clips Esophagus:  Tortuous esophagus. Esophageal motility:  Within normal limits. Gastroesophageal reflux:  None visualized. Ingested 54m barium tablet:  Not given Stomach: Normal appearance.  Large hiatal hernia. Approximately 33% stomach is above the hemidiaphragm. Gastric emptying: Normal. Duodenum:  Normal  appearance. Other:  None. IMPRESSION: Tortuous esophagus with large hiatal hernia. No mass, strictures or obstructions Electronically Signed   By: SSuzy BouchardM.D.   On: 08/16/2022 12:04   CT Angio Chest PE W and/or Wo Contrast  Result Date: 08/14/2022 CLINICAL DATA:  Chest and abdominal pain, initial encounter EXAM: CT ANGIOGRAPHY CHEST CT ABDOMEN AND PELVIS WITH CONTRAST TECHNIQUE: Multidetector CT imaging of the chest was performed using the standard protocol during bolus administration of intravenous contrast. Multiplanar CT image reconstructions and MIPs were obtained to evaluate the vascular anatomy. Multidetector CT imaging of the abdomen and pelvis was performed using the standard protocol during bolus administration of intravenous contrast. RADIATION DOSE REDUCTION: This exam was performed according to the departmental dose-optimization program which includes automated exposure control, adjustment of the mA and/or kV according to patient size and/or use of iterative reconstruction technique. CONTRAST:  832mOMNIPAQUE IOHEXOL 350 MG/ML SOLN COMPARISON:  None Available. FINDINGS: CTA CHEST FINDINGS Cardiovascular: Thoracic aorta shows atherosclerotic calcifications without aneurysmal dilatation or dissection. No cardiac enlargement is noted. Mild coronary calcifications are seen. The pulmonary artery shows a branching pattern bilaterally. Filling defect to suggest pulmonary embolism is noted. Mediastinum/Nodes: Thoracic inlet shows a dominant  2.5 cm nodule in the right lobe of the thyroid. By history this is been previously biopsied and no further follow-up is recommended. Left thyroidectomy is noted. No hilar or mediastinal adenopathy is noted. Fluid is noted within the esophagus likely related to reflux. A large hiatal hernia is noted. Lungs/Pleura: Mild basilar atelectasis is noted in the lungs bilaterally. No focal infiltrate or effusion is seen. No parenchymal nodules are noted. Musculoskeletal:  Degenerative changes of the thoracic spine are noted. No acute rib abnormality is noted. Review of the MIP images confirms the above findings. CT ABDOMEN and PELVIS FINDINGS Hepatobiliary: No focal liver abnormality is seen. Status post cholecystectomy. No biliary dilatation. Pancreas: Unremarkable. No pancreatic ductal dilatation or surrounding inflammatory changes. Spleen: Normal in size without focal abnormality. Adrenals/Urinary Tract: Adrenal glands are within normal limits. Kidneys demonstrate a normal enhancement pattern bilaterally. Small cysts are noted bilaterally which appear simple in nature stable from prior MRI examination. No further follow-up is recommended. No calculi or obstructive changes are seen. Delayed images demonstrate normal excretion of contrast material. The bladder is partially distended. Stomach/Bowel: Scattered diverticular changes noted with wall thickening. No evidence of diverticulitis is seen. The appendix has been surgically removed. Small bowel and stomach are within normal limits aside from the large hiatal hernia. Vascular/Lymphatic: Aortic atherosclerosis. No enlarged abdominal or pelvic lymph nodes. Reproductive: Status post hysterectomy. No adnexal masses. Other: No abdominal wall hernia or abnormality. No abdominopelvic ascites. Musculoskeletal: Multilevel degenerative changes are noted. Review of the MIP images confirms the above findings. IMPRESSION: CTA of the chest: No evidence of aortic dissection or aneurysmal dilatation. No evidence of pulmonary emboli. Dominant right thyroid nodule stable from recent ultrasound examination. This has been evaluated on previous imaging. (ref: J Am Coll Radiol. 2015 Feb;12(2): 143-50). CT of the abdomen and pelvis: Large hiatal hernia. Diverticulosis without diverticulitis. No other focal abnormality is noted. Electronically Signed   By: Inez Catalina M.D.   On: 08/14/2022 20:07   CT ABDOMEN PELVIS W CONTRAST  Result Date:  08/14/2022 CLINICAL DATA:  Chest and abdominal pain, initial encounter EXAM: CT ANGIOGRAPHY CHEST CT ABDOMEN AND PELVIS WITH CONTRAST TECHNIQUE: Multidetector CT imaging of the chest was performed using the standard protocol during bolus administration of intravenous contrast. Multiplanar CT image reconstructions and MIPs were obtained to evaluate the vascular anatomy. Multidetector CT imaging of the abdomen and pelvis was performed using the standard protocol during bolus administration of intravenous contrast. RADIATION DOSE REDUCTION: This exam was performed according to the departmental dose-optimization program which includes automated exposure control, adjustment of the mA and/or kV according to patient size and/or use of iterative reconstruction technique. CONTRAST:  103m OMNIPAQUE IOHEXOL 350 MG/ML SOLN COMPARISON:  None Available. FINDINGS: CTA CHEST FINDINGS Cardiovascular: Thoracic aorta shows atherosclerotic calcifications without aneurysmal dilatation or dissection. No cardiac enlargement is noted. Mild coronary calcifications are seen. The pulmonary artery shows a branching pattern bilaterally. Filling defect to suggest pulmonary embolism is noted. Mediastinum/Nodes: Thoracic inlet shows a dominant 2.5 cm nodule in the right lobe of the thyroid. By history this is been previously biopsied and no further follow-up is recommended. Left thyroidectomy is noted. No hilar or mediastinal adenopathy is noted. Fluid is noted within the esophagus likely related to reflux. A large hiatal hernia is noted. Lungs/Pleura: Mild basilar atelectasis is noted in the lungs bilaterally. No focal infiltrate or effusion is seen. No parenchymal nodules are noted. Musculoskeletal: Degenerative changes of the thoracic spine are noted. No acute rib abnormality is noted.  Review of the MIP images confirms the above findings. CT ABDOMEN and PELVIS FINDINGS Hepatobiliary: No focal liver abnormality is seen. Status post  cholecystectomy. No biliary dilatation. Pancreas: Unremarkable. No pancreatic ductal dilatation or surrounding inflammatory changes. Spleen: Normal in size without focal abnormality. Adrenals/Urinary Tract: Adrenal glands are within normal limits. Kidneys demonstrate a normal enhancement pattern bilaterally. Small cysts are noted bilaterally which appear simple in nature stable from prior MRI examination. No further follow-up is recommended. No calculi or obstructive changes are seen. Delayed images demonstrate normal excretion of contrast material. The bladder is partially distended. Stomach/Bowel: Scattered diverticular changes noted with wall thickening. No evidence of diverticulitis is seen. The appendix has been surgically removed. Small bowel and stomach are within normal limits aside from the large hiatal hernia. Vascular/Lymphatic: Aortic atherosclerosis. No enlarged abdominal or pelvic lymph nodes. Reproductive: Status post hysterectomy. No adnexal masses. Other: No abdominal wall hernia or abnormality. No abdominopelvic ascites. Musculoskeletal: Multilevel degenerative changes are noted. Review of the MIP images confirms the above findings. IMPRESSION: CTA of the chest: No evidence of aortic dissection or aneurysmal dilatation. No evidence of pulmonary emboli. Dominant right thyroid nodule stable from recent ultrasound examination. This has been evaluated on previous imaging. (ref: J Am Coll Radiol. 2015 Feb;12(2): 143-50). CT of the abdomen and pelvis: Large hiatal hernia. Diverticulosis without diverticulitis. No other focal abnormality is noted. Electronically Signed   By: Inez Catalina M.D.   On: 08/14/2022 20:07        Scheduled Meds:  enoxaparin (LOVENOX) injection  40 mg Subcutaneous Q24H   latanoprost  1 drop Right Eye QHS   levothyroxine  75 mcg Oral Q0600   metoCLOPramide (REGLAN) injection  10 mg Intravenous Q6H   scopolamine  1 patch Transdermal Q72H   sertraline  100 mg Oral Daily    timolol  1 drop Left Eye BID   Continuous Infusions:  dextrose 5% lactated ringers 100 mL/hr at 08/16/22 0602   famotidine (PEPCID) IV 20 mg (08/16/22 1012)   promethazine (PHENERGAN) injection (IM or IVPB) Stopped (08/14/22 2232)     LOS: 1 day    Time spent: 35 minutes    Barb Merino, MD [facility] Pager [phone]

## 2022-08-16 NOTE — Progress Notes (Signed)
Subjective/Chief Complaint: Pt with no abd pain today Awaiting UGI   Objective: Vital signs in last 24 hours: Temp:  [97.8 F (36.6 C)-98.7 F (37.1 C)] 98.7 F (37.1 C) (03/08 0556) Pulse Rate:  [60-74] 60 (03/08 0556) Resp:  [14-20] 14 (03/08 0556) BP: (137-158)/(85-97) 158/87 (03/08 0556) SpO2:  [90 %-93 %] 93 % (03/08 0556) Last BM Date : 08/14/22  Intake/Output from previous day: 03/07 0701 - 03/08 0700 In: 2383.1 [I.V.:2283.1; IV Piggyback:100] Out: 1000 [Urine:1000] Intake/Output this shift: No intake/output data recorded.  PE:  Constitutional: No acute distress, conversant, appears states age. Eyes: Anicteric sclerae, moist conjunctiva, no lid lag Lungs: Clear to auscultation bilaterally, normal respiratory effort CV: regular rate and rhythm, no murmurs, no peripheral edema, pedal pulses 2+ GI: Soft, no masses or hepatosplenomegaly, non-tender to palpation Skin: No rashes, palpation reveals normal turgor Psychiatric: appropriate judgment and insight, oriented to person, place, and time   Lab Results:  Recent Labs    08/15/22 0607 08/16/22 0617  WBC 9.0 6.7  HGB 11.2* 11.0*  HCT 37.5 37.3  PLT 229 223   BMET Recent Labs    08/15/22 0607 08/16/22 0617  NA 134* 140  K 3.7 3.7  CL 105 106  CO2 23 26  GLUCOSE 175* 113*  BUN 13 10  CREATININE 0.91 0.97  CALCIUM 10.1 10.7*   PT/INR No results for input(s): "LABPROT", "INR" in the last 72 hours. ABG No results for input(s): "PHART", "HCO3" in the last 72 hours.  Invalid input(s): "PCO2", "PO2"  Studies/Results: CT Angio Chest PE W and/or Wo Contrast  Result Date: 08/14/2022 CLINICAL DATA:  Chest and abdominal pain, initial encounter EXAM: CT ANGIOGRAPHY CHEST CT ABDOMEN AND PELVIS WITH CONTRAST TECHNIQUE: Multidetector CT imaging of the chest was performed using the standard protocol during bolus administration of intravenous contrast. Multiplanar CT image reconstructions and MIPs were  obtained to evaluate the vascular anatomy. Multidetector CT imaging of the abdomen and pelvis was performed using the standard protocol during bolus administration of intravenous contrast. RADIATION DOSE REDUCTION: This exam was performed according to the departmental dose-optimization program which includes automated exposure control, adjustment of the mA and/or kV according to patient size and/or use of iterative reconstruction technique. CONTRAST:  39m OMNIPAQUE IOHEXOL 350 MG/ML SOLN COMPARISON:  None Available. FINDINGS: CTA CHEST FINDINGS Cardiovascular: Thoracic aorta shows atherosclerotic calcifications without aneurysmal dilatation or dissection. No cardiac enlargement is noted. Mild coronary calcifications are seen. The pulmonary artery shows a branching pattern bilaterally. Filling defect to suggest pulmonary embolism is noted. Mediastinum/Nodes: Thoracic inlet shows a dominant 2.5 cm nodule in the right lobe of the thyroid. By history this is been previously biopsied and no further follow-up is recommended. Left thyroidectomy is noted. No hilar or mediastinal adenopathy is noted. Fluid is noted within the esophagus likely related to reflux. A large hiatal hernia is noted. Lungs/Pleura: Mild basilar atelectasis is noted in the lungs bilaterally. No focal infiltrate or effusion is seen. No parenchymal nodules are noted. Musculoskeletal: Degenerative changes of the thoracic spine are noted. No acute rib abnormality is noted. Review of the MIP images confirms the above findings. CT ABDOMEN and PELVIS FINDINGS Hepatobiliary: No focal liver abnormality is seen. Status post cholecystectomy. No biliary dilatation. Pancreas: Unremarkable. No pancreatic ductal dilatation or surrounding inflammatory changes. Spleen: Normal in size without focal abnormality. Adrenals/Urinary Tract: Adrenal glands are within normal limits. Kidneys demonstrate a normal enhancement pattern bilaterally. Small cysts are noted  bilaterally which appear simple in nature  stable from prior MRI examination. No further follow-up is recommended. No calculi or obstructive changes are seen. Delayed images demonstrate normal excretion of contrast material. The bladder is partially distended. Stomach/Bowel: Scattered diverticular changes noted with wall thickening. No evidence of diverticulitis is seen. The appendix has been surgically removed. Small bowel and stomach are within normal limits aside from the large hiatal hernia. Vascular/Lymphatic: Aortic atherosclerosis. No enlarged abdominal or pelvic lymph nodes. Reproductive: Status post hysterectomy. No adnexal masses. Other: No abdominal wall hernia or abnormality. No abdominopelvic ascites. Musculoskeletal: Multilevel degenerative changes are noted. Review of the MIP images confirms the above findings. IMPRESSION: CTA of the chest: No evidence of aortic dissection or aneurysmal dilatation. No evidence of pulmonary emboli. Dominant right thyroid nodule stable from recent ultrasound examination. This has been evaluated on previous imaging. (ref: J Am Coll Radiol. 2015 Feb;12(2): 143-50). CT of the abdomen and pelvis: Large hiatal hernia. Diverticulosis without diverticulitis. No other focal abnormality is noted. Electronically Signed   By: Inez Catalina M.D.   On: 08/14/2022 20:07   CT ABDOMEN PELVIS W CONTRAST  Result Date: 08/14/2022 CLINICAL DATA:  Chest and abdominal pain, initial encounter EXAM: CT ANGIOGRAPHY CHEST CT ABDOMEN AND PELVIS WITH CONTRAST TECHNIQUE: Multidetector CT imaging of the chest was performed using the standard protocol during bolus administration of intravenous contrast. Multiplanar CT image reconstructions and MIPs were obtained to evaluate the vascular anatomy. Multidetector CT imaging of the abdomen and pelvis was performed using the standard protocol during bolus administration of intravenous contrast. RADIATION DOSE REDUCTION: This exam was performed according  to the departmental dose-optimization program which includes automated exposure control, adjustment of the mA and/or kV according to patient size and/or use of iterative reconstruction technique. CONTRAST:  63m OMNIPAQUE IOHEXOL 350 MG/ML SOLN COMPARISON:  None Available. FINDINGS: CTA CHEST FINDINGS Cardiovascular: Thoracic aorta shows atherosclerotic calcifications without aneurysmal dilatation or dissection. No cardiac enlargement is noted. Mild coronary calcifications are seen. The pulmonary artery shows a branching pattern bilaterally. Filling defect to suggest pulmonary embolism is noted. Mediastinum/Nodes: Thoracic inlet shows a dominant 2.5 cm nodule in the right lobe of the thyroid. By history this is been previously biopsied and no further follow-up is recommended. Left thyroidectomy is noted. No hilar or mediastinal adenopathy is noted. Fluid is noted within the esophagus likely related to reflux. A large hiatal hernia is noted. Lungs/Pleura: Mild basilar atelectasis is noted in the lungs bilaterally. No focal infiltrate or effusion is seen. No parenchymal nodules are noted. Musculoskeletal: Degenerative changes of the thoracic spine are noted. No acute rib abnormality is noted. Review of the MIP images confirms the above findings. CT ABDOMEN and PELVIS FINDINGS Hepatobiliary: No focal liver abnormality is seen. Status post cholecystectomy. No biliary dilatation. Pancreas: Unremarkable. No pancreatic ductal dilatation or surrounding inflammatory changes. Spleen: Normal in size without focal abnormality. Adrenals/Urinary Tract: Adrenal glands are within normal limits. Kidneys demonstrate a normal enhancement pattern bilaterally. Small cysts are noted bilaterally which appear simple in nature stable from prior MRI examination. No further follow-up is recommended. No calculi or obstructive changes are seen. Delayed images demonstrate normal excretion of contrast material. The bladder is partially distended.  Stomach/Bowel: Scattered diverticular changes noted with wall thickening. No evidence of diverticulitis is seen. The appendix has been surgically removed. Small bowel and stomach are within normal limits aside from the large hiatal hernia. Vascular/Lymphatic: Aortic atherosclerosis. No enlarged abdominal or pelvic lymph nodes. Reproductive: Status post hysterectomy. No adnexal masses. Other: No abdominal wall hernia or  abnormality. No abdominopelvic ascites. Musculoskeletal: Multilevel degenerative changes are noted. Review of the MIP images confirms the above findings. IMPRESSION: CTA of the chest: No evidence of aortic dissection or aneurysmal dilatation. No evidence of pulmonary emboli. Dominant right thyroid nodule stable from recent ultrasound examination. This has been evaluated on previous imaging. (ref: J Am Coll Radiol. 2015 Feb;12(2): 143-50). CT of the abdomen and pelvis: Large hiatal hernia. Diverticulosis without diverticulitis. No other focal abnormality is noted. Electronically Signed   By: Inez Catalina M.D.   On: 08/14/2022 20:07       Assessment/Plan: 65F with large hiatal hernia -will await UGI -Will n eed endoscopy per GI  -No emergent need for HHR and can be seen as outpt for surgical repair after GI workup. -OK to adv diet if OK with GI and after UGI  This care required straight-forward level of medical decision making.    LOS: 1 day    Lynn Jenkins 08/16/2022

## 2022-08-16 NOTE — Consult Note (Signed)
[facility] Consultation Note  Referring Provider: [facility] Primary Care Physician:  Lawerance Cruel, MD Primary Gastroenterologist:  [facility] (former Dr. Penelope Coop)  Reason for Consultation:  nausea, vomiting  HPI: Lynn Jenkins is a 77 y.o. female admitted nausea/vomiting.  Ongoing several days, better today.  No dysphagia, blood in stool, unintentional weight loss.  Formerly has seen Dr. Penelope Coop, had endoscopy and colonoscopy 10+ years ago.  Saw one of our PA's couple years ago, and endoscopy/colonoscopy advised for iron-deficiency anemia, but this was never done; currently Hgb ~11.5.  UGI today showed tortuous esophagus and large hiatal hernia.   Past Medical History:  Diagnosis Date   Anemia    Arthritis    Generalized anxiety disorder    GERD (gastroesophageal reflux disease)    Glaucoma    Headache    hx of migraines    History of hiatal hernia    Hyperthyroidism    Lupus (HCC)    Migraine    Mixed hyperlipidemia    Multinodular goiter    Osteopenia    Primary hyperparathyroidism (Hydro)    Varicose veins    Vitamin D deficiency     Past Surgical History:  Procedure Laterality Date   ABDOMINAL HYSTERECTOMY     APPENDECTOMY     CHOLECYSTECTOMY N/A 03/11/2022   Procedure: LAPAROSCOPIC CHOLECYSTECTOMY WITH icg dye;  Surgeon: Clovis Riley, MD;  Location: [facility];  Service: General;  Laterality: N/A;   JOINT REPLACEMENT     left foot surgery      PARATHYROIDECTOMY N/A 11/01/2014   Procedure: NECK EXPLORATION AND PARATHYROIDECTOMY;  Surgeon: Armandina Gemma, MD;  Location: [facility];  Service: General;  Laterality: N/A;   PARATHYROIDECTOMY N/A 10/31/2020   Procedure: PARATHYROIDECTOMY WITH LEFT THYROID LOBECTOMY;  Surgeon: Armandina Gemma, MD;  Location: [facility];  Service: General;  Laterality: N/A;   SHOULDER SURGERY      Prior to Admission medications   Medication Sig Start Date End Date Taking? Authorizing Provider  acetaminophen (TYLENOL) 500 MG tablet Take  500-1,000 mg by mouth every 6 (six) hours as needed for mild pain or moderate pain.   Yes [provider]  esomeprazole (NEXIUM) 40 MG capsule Take 40 mg by mouth 2 (two) times daily before a meal.    Yes [provider]  furosemide (LASIX) 20 MG tablet Take 20 mg by mouth daily as needed for fluid or edema. 08/26/18  Yes [provider]  ibuprofen (ADVIL) 200 MG tablet Take 200-400 mg by mouth daily as needed (for pain).   Yes [provider]  levothyroxine (SYNTHROID) 88 MCG tablet Take 88 mcg by mouth daily before breakfast. 03/06/22  Yes [provider]  loperamide (IMODIUM A-D) 2 MG tablet Take 2 mg by mouth 4 (four) times daily as needed for diarrhea or loose stools.   Yes [provider]  metoCLOPramide (REGLAN) 10 MG tablet Take 1 tablet (10 mg total) by mouth every 6 (six) hours as needed for nausea or vomiting. Patient taking differently: Take 5-10 mg by mouth See admin instructions. Take 5-10 mg by mouth up to four times a day before meals for nausea 11/14/19  Yes Hayden Rasmussen, MD  nystatin (MYCOSTATIN/NYSTOP) powder Apply 1 Application topically 2 (two) times daily as needed (to affected area, if a rash is present). 07/30/22  Yes [provider]  ondansetron (ZOFRAN-ODT) 4 MG disintegrating tablet '4mg'$  ODT q4 hours prn nausea/vomit 08/14/22  Yes Drenda Freeze, MD  REFRESH PLUS 0.5 %  SOLN Place 1 drop into both eyes 2 (two) times daily.   Yes [provider]  SUMAtriptan (IMITREX) 100 MG tablet Take 100 mg by mouth See admin instructions. Take 100 mg by mouth at onset of headache and may repeat once in 2 hours, if no relief- Max of 200 mg/24 hours 08/26/18  Yes [provider]  timolol (TIMOPTIC) 0.5 % ophthalmic solution Place 1 drop into the left eye 2 (two) times daily.   Yes [provider]  traMADol (ULTRAM) 50 MG tablet Take 1 tablet (50 mg total) by mouth every 6 (six) hours as needed for severe  pain. 04/05/22  Yes Trula Slade, DPM  XALATAN 0.005 % ophthalmic solution Place 1 drop into the right eye at bedtime. 03/18/15  Yes [provider]  cinacalcet (SENSIPAR) 30 MG tablet Take 1 tablet (30 mg total) by mouth daily with breakfast. Patient not taking: Reported on 08/15/2022 03/16/22   Hosie Poisson, MD  ferrous sulfate 325 (65 FE) MG tablet Take 1 tablet (325 mg total) by mouth 2 (two) times daily with a meal. Patient not taking: Reported on 08/15/2022 03/15/22   Hosie Poisson, MD  sertraline (ZOLOFT) 100 MG tablet Take 100 mg by mouth daily.    [provider]    Current Facility-Administered Medications  Medication Dose Route Frequency Provider Last Rate Last Admin   dextrose 5 % in lactated ringers infusion   Intravenous Continuous Donnamae Jude, MD 100 mL/hr at 08/16/22 0602 Infusion Verify at 08/16/22 0602   enoxaparin (LOVENOX) injection 40 mg  40 mg Subcutaneous Q24H Donnamae Jude, MD   40 mg at 08/16/22 1012   famotidine (PEPCID) IVPB 20 mg premix  20 mg Intravenous Q12H Barb Merino, MD 100 mL/hr at 08/16/22 1012 20 mg at 08/16/22 1012   fentaNYL (SUBLIMAZE) injection 12.5-50 mcg  12.5-50 mcg Intravenous Q2H PRN Donnamae Jude, MD       latanoprost (XALATAN) 0.005 % ophthalmic solution 1 drop  1 drop Right Eye QHS Donnamae Jude, MD   1 drop at 08/15/22 2126   levothyroxine (SYNTHROID) tablet 75 mcg  75 mcg Oral Q0600 Barb Merino, MD   75 mcg at 08/16/22 0542   LORazepam (ATIVAN) tablet 0.5 mg  0.5 mg Oral Q6H PRN Barb Merino, MD   0.5 mg at 08/15/22 2344   metoCLOPramide (REGLAN) injection 10 mg  10 mg Intravenous Q6H Donnamae Jude, MD   10 mg at 08/16/22 1419   metoprolol tartrate (LOPRESSOR) injection 5 mg  5 mg Intravenous Q6H PRN Donnamae Jude, MD       ondansetron [facility]) injection 4 mg  4 mg Intravenous Q6H PRN Barb Merino, MD       phosphorus (K PHOS NEUTRAL) tablet 250 mg  250 mg Oral TID Barb Merino, MD       promethazine  (PHENERGAN) 12.5 mg in sodium chloride 0.9 % 50 mL IVPB  12.5 mg Intravenous Q6H PRN Donnamae Jude, MD   Stopped at 08/14/22 2232   scopolamine (TRANSDERM-SCOP) 1 MG/3DAYS 1.5 mg  1 patch Transdermal Q72H Donnamae Jude, MD   1.5 mg at 08/15/22 0026   sertraline (ZOLOFT) tablet 100 mg  100 mg Oral Daily Barb Merino, MD   100 mg at 08/16/22 1012   timolol (TIMOPTIC) 0.5 % ophthalmic solution 1 drop  1 drop Left Eye BID Barb Merino, MD   1 drop at 08/16/22 1419    Allergies as of 08/14/2022 -  Review Complete 08/14/2022  Allergen Reaction Noted   Doxycycline Swelling 11/18/2018   Morphine and related Swelling 11/12/2011   Other Swelling 11/12/2011   Zomig [zolmitriptan] Nausea And Vomiting 04/06/2015   Augmentin [amoxicillin-pot clavulanate] Diarrhea 11/18/2018   Ceftin [cefuroxime axetil] Other (See Comments) 11/18/2018   Gabapentin Other (See Comments) 11/18/2018   Methazolamide Diarrhea 02/05/2021   Biaxin [clarithromycin] Rash 11/18/2018   Cefdinir Rash 11/18/2018   Sumatriptan Other (See Comments) 11/18/2018    Family History  Problem Relation Age of Onset   Heart disease Mother    Hyperlipidemia Mother    Hypertension Mother    Heart disease Father    Hyperlipidemia Father    Hypertension Father    Cancer Brother    Hypertension Brother     Social History   Socioeconomic History   Marital status: Married    Spouse name: Not on file   Number of children: 1   Years of education: Not on file   Highest education level: Not on file  Occupational History    Comment: office work  Tobacco Use   Smoking status: Never   Smokeless tobacco: Never  Vaping Use   Vaping Use: Never used  Substance and Sexual Activity   Alcohol use: No   Drug use: No   Sexual activity: Not on file  Other Topics Concern   Not on file  Social History Narrative   Lives with spouse   Caffeine- coffee 2 daily   Social Determinants of Health   Financial Resource Strain: Not on file   Food Insecurity: No Food Insecurity (08/15/2022)   Hunger Vital Sign    Worried About Running Out of Food in the Last Year: Never true    Ran Out of Food in the Last Year: Never true  Transportation Needs: No Transportation Needs (08/15/2022)   PRAPARE - Hydrologist (Medical): No    Lack of Transportation (Non-Medical): No  Physical Activity: Not on file  Stress: Not on file  Social Connections: Not on file  Intimate Partner Violence: Not At Risk (08/15/2022)   Humiliation, Afraid, Rape, and Kick questionnaire    Fear of Current or Ex-Partner: No    Emotionally Abused: No    Physically Abused: No    Sexually Abused: No    Review of Systems: As per HPI, all others negative  Physical Exam: Vital signs in last 24 hours: Temp:  [97.4 F (36.3 C)-98.7 F (37.1 C)] 97.4 F (36.3 C) (03/08 1315) Pulse Rate:  [60-76] 76 (03/08 1315) Resp:  [14-17] 17 (03/08 1315) BP: (150-161)/(87-94) 161/94 (03/08 1315) SpO2:  [90 %-95 %] 95 % (03/08 1315) Last BM Date : 08/14/22 General:   Alert,  Well-developed, well-nourished, pleasant and cooperative in NAD Head:  Normocephalic and atraumatic. Eyes:  Sclera clear, no icterus.   Conjunctiva pink. Ears:  Normal auditory acuity. Nose:  No deformity, discharge,  or lesions. Mouth:  No deformity or lesions.  Oropharynx pink & moist. Neck:  Supple; no masses or thyromegaly. Lungs:  No respiratory distress Abdomen:  Soft, nontender and nondistended. No masses, hepatosplenomegaly or hernias noted. Normal bowel sounds, without guarding, and without rebound.     Msk:  Symmetrical without gross deformities. Normal posture. Pulses:  Normal pulses noted. Extremities:  Without clubbing or edema. Neurologic:  Alert and  oriented x4;  grossly normal neurologically. Cervical Nodes:  No significant cervical adenopathy. Psych:  Alert and cooperative. Normal mood and affect.   Lab Results:  Recent Labs    08/14/22 1720  08/15/22 0607 08/16/22 0617  WBC 7.2 9.0 6.7  HGB 11.6* 11.2* 11.0*  HCT 38.6 37.5 37.3  PLT 248 229 223   BMET Recent Labs    08/14/22 1720 08/15/22 0607 08/16/22 0617  NA 137 134* 140  K 3.9 3.7 3.7  CL 108 105 106  CO2 '26 23 26  '$ GLUCOSE 105* 175* 113*  BUN '18 13 10  '$ CREATININE 1.16* 0.91 0.97  CALCIUM 11.2* 10.1 10.7*   LFT Recent Labs    08/16/22 0617  PROT 6.5  ALBUMIN 3.3*  AST 17  ALT 15  ALKPHOS 93  BILITOT 0.5   PT/INR No results for input(s): "LABPROT", "INR" in the last 72 hours.  Studies/Results: DG UGI W SINGLE CM (SOL OR THIN BA)  Result Date: 08/16/2022 CLINICAL DATA:  77 year old female history of hiatal hernia. Admitted for nausea and vomiting. Team is requesting upper GI for further evaluation for possible hiatal hernia repair EXAM: DG UGI W SINGLE CM TECHNIQUE: Scout radiograph was obtained. Single contrast examination was performed using thin liquid barium. This exam was performed by Rushie Nyhan NPand was supervised and interpreted by Dr. Suzy Bouchard FLUOROSCOPY: Radiation Exposure Index (as provided by the fluoroscopic device): 22.5 mGy Kerma COMPARISON:  None Available. FINDINGS: Scout Radiograph: Nonobstructive bowel gas pattern. Prior cholecystectomy clips Esophagus:  Tortuous esophagus. Esophageal motility:  Within normal limits. Gastroesophageal reflux:  None visualized. Ingested 47m barium tablet:  Not given Stomach: Normal appearance.  Large hiatal hernia. Approximately 33% stomach is above the hemidiaphragm. Gastric emptying: Normal. Duodenum:  Normal appearance. Other:  None. IMPRESSION: Tortuous esophagus with large hiatal hernia. No mass, strictures or obstructions Electronically Signed   By: SSuzy BouchardM.D.   On: 08/16/2022 12:04   CT Angio Chest PE W and/or Wo Contrast  Result Date: 08/14/2022 CLINICAL DATA:  Chest and abdominal pain, initial encounter EXAM: CT ANGIOGRAPHY CHEST CT ABDOMEN AND PELVIS WITH CONTRAST  TECHNIQUE: Multidetector CT imaging of the chest was performed using the standard protocol during bolus administration of intravenous contrast. Multiplanar CT image reconstructions and MIPs were obtained to evaluate the vascular anatomy. Multidetector CT imaging of the abdomen and pelvis was performed using the standard protocol during bolus administration of intravenous contrast. RADIATION DOSE REDUCTION: This exam was performed according to the departmental dose-optimization program which includes automated exposure control, adjustment of the mA and/or kV according to patient size and/or use of iterative reconstruction technique. CONTRAST:  835mOMNIPAQUE IOHEXOL 350 MG/ML SOLN COMPARISON:  None Available. FINDINGS: CTA CHEST FINDINGS Cardiovascular: Thoracic aorta shows atherosclerotic calcifications without aneurysmal dilatation or dissection. No cardiac enlargement is noted. Mild coronary calcifications are seen. The pulmonary artery shows a branching pattern bilaterally. Filling defect to suggest pulmonary embolism is noted. Mediastinum/Nodes: Thoracic inlet shows a dominant 2.5 cm nodule in the right lobe of the thyroid. By history this is been previously biopsied and no further follow-up is recommended. Left thyroidectomy is noted. No hilar or mediastinal adenopathy is noted. Fluid is noted within the esophagus likely related to reflux. A large hiatal hernia is noted. Lungs/Pleura: Mild basilar atelectasis is noted in the lungs bilaterally. No focal infiltrate or effusion is seen. No parenchymal nodules are noted. Musculoskeletal: Degenerative changes of the thoracic spine are noted. No acute rib abnormality is noted. Review of the MIP images confirms the above findings. CT ABDOMEN and PELVIS FINDINGS Hepatobiliary: No focal liver abnormality is seen. Status post cholecystectomy. No biliary dilatation. Pancreas: Unremarkable.  No pancreatic ductal dilatation or surrounding inflammatory changes. Spleen: Normal  in size without focal abnormality. Adrenals/Urinary Tract: Adrenal glands are within normal limits. Kidneys demonstrate a normal enhancement pattern bilaterally. Small cysts are noted bilaterally which appear simple in nature stable from prior MRI examination. No further follow-up is recommended. No calculi or obstructive changes are seen. Delayed images demonstrate normal excretion of contrast material. The bladder is partially distended. Stomach/Bowel: Scattered diverticular changes noted with wall thickening. No evidence of diverticulitis is seen. The appendix has been surgically removed. Small bowel and stomach are within normal limits aside from the large hiatal hernia. Vascular/Lymphatic: Aortic atherosclerosis. No enlarged abdominal or pelvic lymph nodes. Reproductive: Status post hysterectomy. No adnexal masses. Other: No abdominal wall hernia or abnormality. No abdominopelvic ascites. Musculoskeletal: Multilevel degenerative changes are noted. Review of the MIP images confirms the above findings. IMPRESSION: CTA of the chest: No evidence of aortic dissection or aneurysmal dilatation. No evidence of pulmonary emboli. Dominant right thyroid nodule stable from recent ultrasound examination. This has been evaluated on previous imaging. (ref: J Am Coll Radiol. 2015 Feb;12(2): 143-50). CT of the abdomen and pelvis: Large hiatal hernia. Diverticulosis without diverticulitis. No other focal abnormality is noted. Electronically Signed   By: Inez Catalina M.D.   On: 08/14/2022 20:07   CT ABDOMEN PELVIS W CONTRAST  Result Date: 08/14/2022 CLINICAL DATA:  Chest and abdominal pain, initial encounter EXAM: CT ANGIOGRAPHY CHEST CT ABDOMEN AND PELVIS WITH CONTRAST TECHNIQUE: Multidetector CT imaging of the chest was performed using the standard protocol during bolus administration of intravenous contrast. Multiplanar CT image reconstructions and MIPs were obtained to evaluate the vascular anatomy. Multidetector CT  imaging of the abdomen and pelvis was performed using the standard protocol during bolus administration of intravenous contrast. RADIATION DOSE REDUCTION: This exam was performed according to the departmental dose-optimization program which includes automated exposure control, adjustment of the mA and/or kV according to patient size and/or use of iterative reconstruction technique. CONTRAST:  55m OMNIPAQUE IOHEXOL 350 MG/ML SOLN COMPARISON:  None Available. FINDINGS: CTA CHEST FINDINGS Cardiovascular: Thoracic aorta shows atherosclerotic calcifications without aneurysmal dilatation or dissection. No cardiac enlargement is noted. Mild coronary calcifications are seen. The pulmonary artery shows a branching pattern bilaterally. Filling defect to suggest pulmonary embolism is noted. Mediastinum/Nodes: Thoracic inlet shows a dominant 2.5 cm nodule in the right lobe of the thyroid. By history this is been previously biopsied and no further follow-up is recommended. Left thyroidectomy is noted. No hilar or mediastinal adenopathy is noted. Fluid is noted within the esophagus likely related to reflux. A large hiatal hernia is noted. Lungs/Pleura: Mild basilar atelectasis is noted in the lungs bilaterally. No focal infiltrate or effusion is seen. No parenchymal nodules are noted. Musculoskeletal: Degenerative changes of the thoracic spine are noted. No acute rib abnormality is noted. Review of the MIP images confirms the above findings. CT ABDOMEN and PELVIS FINDINGS Hepatobiliary: No focal liver abnormality is seen. Status post cholecystectomy. No biliary dilatation. Pancreas: Unremarkable. No pancreatic ductal dilatation or surrounding inflammatory changes. Spleen: Normal in size without focal abnormality. Adrenals/Urinary Tract: Adrenal glands are within normal limits. Kidneys demonstrate a normal enhancement pattern bilaterally. Small cysts are noted bilaterally which appear simple in nature stable from prior MRI  examination. No further follow-up is recommended. No calculi or obstructive changes are seen. Delayed images demonstrate normal excretion of contrast material. The bladder is partially distended. Stomach/Bowel: Scattered diverticular changes noted with wall thickening. No evidence of diverticulitis is seen. The appendix  has been surgically removed. Small bowel and stomach are within normal limits aside from the large hiatal hernia. Vascular/Lymphatic: Aortic atherosclerosis. No enlarged abdominal or pelvic lymph nodes. Reproductive: Status post hysterectomy. No adnexal masses. Other: No abdominal wall hernia or abnormality. No abdominopelvic ascites. Musculoskeletal: Multilevel degenerative changes are noted. Review of the MIP images confirms the above findings. IMPRESSION: CTA of the chest: No evidence of aortic dissection or aneurysmal dilatation. No evidence of pulmonary emboli. Dominant right thyroid nodule stable from recent ultrasound examination. This has been evaluated on previous imaging. (ref: J Am Coll Radiol. 2015 Feb;12(2): 143-50). CT of the abdomen and pelvis: Large hiatal hernia. Diverticulosis without diverticulitis. No other focal abnormality is noted. Electronically Signed   By: Inez Catalina M.D.   On: 08/14/2022 20:07    Impression:   Nausea and vomiting. Large hiatal hernia. 3.  Tortuous esophagus   Plan:   Patient feels better; would advise antiemetics, trial of liquid diet and close GI follow-up. If patient tolerating diet upon follow-up evaluation tomorrow, would likely not pursue any GI testing at this point; on the other hand, if she's having ongoing/worsening nausea/vomiting, would consider an endoscopy. [facility] will follow.   LOS: 1 day   Tamar Lipscomb M  08/16/2022, 2:41 PM  Cell [phone] If no answer or after 5 PM call [phone]

## 2022-08-17 MED ORDER — METOCLOPRAMIDE HCL 10 MG PO TABS: 10.0000 mg | ORAL_TABLET | Freq: Four times a day (QID) | ORAL | Status: DC | PRN

## 2022-08-17 MED ORDER — FAMOTIDINE 20 MG PO TABS
20.0000 mg | ORAL_TABLET | Freq: Two times a day (BID) | ORAL | Status: DC
  Administered 2022-08-17: 20 mg via ORAL
  Filled 2022-08-17: qty 1

## 2022-08-17 MED ORDER — SCOPOLAMINE 1 MG/3DAYS TD PT72: 1.0000 | MEDICATED_PATCH | TRANSDERMAL | 12 refills | Status: DC

## 2022-08-17 MED ORDER — K PHOS MONO-SOD PHOS DI & MONO 155-852-130 MG PO TABS: 250.0000 mg | ORAL_TABLET | Freq: Three times a day (TID) | ORAL | 0 refills | Status: AC

## 2022-08-17 NOTE — Discharge Summary (Signed)
Physician Discharge Summary  Lynn Jenkins [id number] DOB: Aug 20, 1945 DOA: 08/14/2022  PCP: Lynn Cruel, MD  Admit date: 08/14/2022 Discharge date: 08/17/2022  Admitted From: Home Disposition: Home  Recommendations for Outpatient Follow-up:  Follow up with PCP in 1-2 weeks Please obtain BMP/CBC/magnesium/phosphorus in one week   Discharge Condition: Stable CODE STATUS: Full code Diet recommendation: Regular, soft diet, small frequent meals and aspiration precautions.  Discharge summary: 77 year old with history of rheumatoid arthritis, hyperparathyroidism, hypothyroidism, glaucoma, GERD, status postcholecystectomy who does have occasional nausea relieved by Reglan but severe persistent symptoms for the last 2 days so came to the emergency room.  Multiple medications in the emergency room without significant relief so admitted to the hospital.  Electrolytes normal.  On room air.  CT scan with large hiatal hernia.   # Acute intractable nausea and vomiting in a patient with chronic nausea: Probably secondary to her hiatal hernia/esophageal dysmotility. Patient initially presented with intolerance to any liquid or sips of water.  She was treated with a scheduled metoclopramide, Zofran along with scopolamine patch.  With conservative management she did much clinical improvement and symptoms are mostly improved today.  She is tolerating soft diet. Continue metoclopramide 10 mg every 6 hours as needed as she was doing in the past.  Additional Zofran for refractory nausea vomiting.  Scopolamine patch for refractory nausea vomiting prescribed. CT scan consistent with large hiatal hernia, upper GI series consistent with tortuous esophagus. Seen by surgery, currently did not recommend any surgery. Seen by gastroenterology, since he is already tolerating oral intake they will schedule outpatient follow-up. Continue PPI.   Chronic medical issues including Hyperparathyroidism, stable.  Patient  being evaluated for parathyroid removal by surgery. Hypothyroidism, on Synthroid.  Stable. Anxiety, on sertraline.  Stable.  Tolerated diet.  Stable for discharge.   Discharge Diagnoses:  Principal Problem:   Intractable vomiting Active Problems:   Hyperparathyroidism, primary ([name])   Glaucoma   Hypothyroidism   Migraine headache without aura   Fibromyalgia   Inflammatory arthritis   Intractable nausea and vomiting    Discharge Instructions  Discharge Instructions     Call MD for:  persistant nausea and vomiting   Complete by: As directed    Diet - low sodium heart healthy   Complete by: As directed    Increase activity slowly   Complete by: As directed       Allergies as of 08/17/2022       Reactions   Doxycycline Itching, Swelling, Other (See Comments)   Facial swelling, itching   Morphine And Related Swelling   Zomig [zolmitriptan] Nausea And Vomiting   Increased heart rate    Augmentin [amoxicillin-pot Clavulanate] Diarrhea   Ceftin [cefuroxime Axetil] Other (See Comments)   Stomach ache   Gabapentin Other (See Comments)   Confusion, off balance   Ferrous Sulfate Diarrhea   Methazolamide Diarrhea   Biaxin [clarithromycin] Rash   Cefdinir Rash   Sumatriptan Other (See Comments)   Can use brand Imitrex        Medication List     STOP taking these medications    cinacalcet 30 MG tablet Commonly known as: SENSIPAR   ferrous sulfate 325 (65 FE) MG tablet       TAKE these medications    acetaminophen 500 MG tablet Commonly known as: TYLENOL Take 500-1,000 mg by mouth every 6 (six) hours as needed for mild pain or moderate pain.   esomeprazole 40 MG capsule Commonly known as: NEXIUM Take 40 mg  by mouth 2 (two) times daily before a meal.   furosemide 20 MG tablet Commonly known as: LASIX Take 20 mg by mouth daily as needed for fluid or edema.   ibuprofen 200 MG tablet Commonly known as: ADVIL Take 200-400 mg by mouth daily as needed (for  pain).   levothyroxine 88 MCG tablet Commonly known as: SYNTHROID Take 88 mcg by mouth daily before breakfast.   loperamide 2 MG tablet Commonly known as: IMODIUM A-D Take 2 mg by mouth 4 (four) times daily as needed for diarrhea or loose stools.   metoCLOPramide 10 MG tablet Commonly known as: REGLAN Take 1 tablet (10 mg total) by mouth every 6 (six) hours as needed for nausea or vomiting. What changed:  how much to take when to take this additional instructions   nystatin powder Commonly known as: MYCOSTATIN/NYSTOP Apply 1 Application topically 2 (two) times daily as needed (to affected area, if a rash is present).   ondansetron 4 MG disintegrating tablet Commonly known as: ZOFRAN-ODT '4mg'$  ODT q4 hours prn nausea/vomit   phosphorus 155-852-130 MG tablet Commonly known as: K PHOS NEUTRAL Take 1 tablet (250 mg total) by mouth 3 (three) times daily for 7 days.   Refresh Plus 0.5 % Soln Generic drug: Carboxymethylcellulose Sod PF Place 1 drop into both eyes 2 (two) times daily.   scopolamine 1 MG/3DAYS Commonly known as: TRANSDERM-SCOP Place 1 patch (1.5 mg total) onto the skin every 3 (three) days.   sertraline 100 MG tablet Commonly known as: ZOLOFT Take 100 mg by mouth daily.   SUMAtriptan 100 MG tablet Commonly known as: IMITREX Take 100 mg by mouth See admin instructions. Take 100 mg by mouth at onset of headache and may repeat once in 2 hours, if no relief- Max of 200 mg/24 hours   timolol 0.5 % ophthalmic solution Commonly known as: TIMOPTIC Place 1 drop into the left eye 2 (two) times daily.   traMADol 50 MG tablet Commonly known as: ULTRAM Take 1 tablet (50 mg total) by mouth every 6 (six) hours as needed for severe pain.   Xalatan 0.005 % ophthalmic solution Generic drug: latanoprost Place 1 drop into the right eye at bedtime.        Follow-up Information     Ralene Ok, MD Follow up.   Specialty: General Surgery Why: our office is  scheduling you for follow up with a surgeon to discuss hernia repair. Contact information: [address] [place] [address] [phone]                Allergies  Allergen Reactions   Doxycycline Itching, Swelling and Other (See Comments)    Facial swelling, itching   Morphine And Related Swelling   Zomig [Zolmitriptan] Nausea And Vomiting    Increased heart rate    Augmentin [Amoxicillin-Pot Clavulanate] Diarrhea   Ceftin [Cefuroxime Axetil] Other (See Comments)    Stomach ache   Gabapentin Other (See Comments)    Confusion, off balance   Ferrous Sulfate Diarrhea   Methazolamide Diarrhea   Biaxin [Clarithromycin] Rash   Cefdinir Rash   Sumatriptan Other (See Comments)    Can use brand Imitrex    Consultations: Gastroenterology General surgery   Procedures/Studies: DG UGI W SINGLE CM (SOL OR THIN BA)  Result Date: 08/16/2022 CLINICAL DATA:  77 year old female history of hiatal hernia. Admitted for nausea and vomiting. Team is requesting upper GI for further evaluation for possible hiatal hernia repair EXAM: DG UGI W  SINGLE CM TECHNIQUE: Scout radiograph was obtained. Single contrast examination was performed using thin liquid barium. This exam was performed by Rushie Nyhan NPand was supervised and interpreted by Dr. Suzy Bouchard FLUOROSCOPY: Radiation Exposure Index (as provided by the fluoroscopic device): 22.5 mGy Kerma COMPARISON:  None Available. FINDINGS: Scout Radiograph: Nonobstructive bowel gas pattern. Prior cholecystectomy clips Esophagus:  Tortuous esophagus. Esophageal motility:  Within normal limits. Gastroesophageal reflux:  None visualized. Ingested 72m barium tablet:  Not given Stomach: Normal appearance.  Large hiatal hernia. Approximately 33% stomach is above the hemidiaphragm. Gastric emptying: Normal. Duodenum:  Normal appearance. Other:  None. IMPRESSION: Tortuous esophagus with large hiatal hernia. No mass, strictures or  obstructions Electronically Signed   By: SSuzy BouchardM.D.   On: 08/16/2022 12:04   CT Angio Chest PE W and/or Wo Contrast  Result Date: 08/14/2022 CLINICAL DATA:  Chest and abdominal pain, initial encounter EXAM: CT ANGIOGRAPHY CHEST CT ABDOMEN AND PELVIS WITH CONTRAST TECHNIQUE: Multidetector CT imaging of the chest was performed using the standard protocol during bolus administration of intravenous contrast. Multiplanar CT image reconstructions and MIPs were obtained to evaluate the vascular anatomy. Multidetector CT imaging of the abdomen and pelvis was performed using the standard protocol during bolus administration of intravenous contrast. RADIATION DOSE REDUCTION: This exam was performed according to the departmental dose-optimization program which includes automated exposure control, adjustment of the mA and/or kV according to patient size and/or use of iterative reconstruction technique. CONTRAST:  891mOMNIPAQUE IOHEXOL 350 MG/ML SOLN COMPARISON:  None Available. FINDINGS: CTA CHEST FINDINGS Cardiovascular: Thoracic aorta shows atherosclerotic calcifications without aneurysmal dilatation or dissection. No cardiac enlargement is noted. Mild coronary calcifications are seen. The pulmonary artery shows a branching pattern bilaterally. Filling defect to suggest pulmonary embolism is noted. Mediastinum/Nodes: Thoracic inlet shows a dominant 2.5 cm nodule in the right lobe of the thyroid. By history this is been previously biopsied and no further follow-up is recommended. Left thyroidectomy is noted. No hilar or mediastinal adenopathy is noted. Fluid is noted within the esophagus likely related to reflux. A large hiatal hernia is noted. Lungs/Pleura: Mild basilar atelectasis is noted in the lungs bilaterally. No focal infiltrate or effusion is seen. No parenchymal nodules are noted. Musculoskeletal: Degenerative changes of the thoracic spine are noted. No acute rib abnormality is noted. Review of the MIP  images confirms the above findings. CT ABDOMEN and PELVIS FINDINGS Hepatobiliary: No focal liver abnormality is seen. Status post cholecystectomy. No biliary dilatation. Pancreas: Unremarkable. No pancreatic ductal dilatation or surrounding inflammatory changes. Spleen: Normal in size without focal abnormality. Adrenals/Urinary Tract: Adrenal glands are within normal limits. Kidneys demonstrate a normal enhancement pattern bilaterally. Small cysts are noted bilaterally which appear simple in nature stable from prior MRI examination. No further follow-up is recommended. No calculi or obstructive changes are seen. Delayed images demonstrate normal excretion of contrast material. The bladder is partially distended. Stomach/Bowel: Scattered diverticular changes noted with wall thickening. No evidence of diverticulitis is seen. The appendix has been surgically removed. Small bowel and stomach are within normal limits aside from the large hiatal hernia. Vascular/Lymphatic: Aortic atherosclerosis. No enlarged abdominal or pelvic lymph nodes. Reproductive: Status post hysterectomy. No adnexal masses. Other: No abdominal wall hernia or abnormality. No abdominopelvic ascites. Musculoskeletal: Multilevel degenerative changes are noted. Review of the MIP images confirms the above findings. IMPRESSION: CTA of the chest: No evidence of aortic dissection or aneurysmal dilatation. No evidence of pulmonary emboli. Dominant right thyroid nodule stable from recent ultrasound examination.  This has been evaluated on previous imaging. (ref: J Am Coll Radiol. 2015 Feb;12(2): 143-50). CT of the abdomen and pelvis: Large hiatal hernia. Diverticulosis without diverticulitis. No other focal abnormality is noted. Electronically Signed   By: Inez Catalina M.D.   On: 08/14/2022 20:07   CT ABDOMEN PELVIS W CONTRAST  Result Date: 08/14/2022 CLINICAL DATA:  Chest and abdominal pain, initial encounter EXAM: CT ANGIOGRAPHY CHEST CT ABDOMEN AND  PELVIS WITH CONTRAST TECHNIQUE: Multidetector CT imaging of the chest was performed using the standard protocol during bolus administration of intravenous contrast. Multiplanar CT image reconstructions and MIPs were obtained to evaluate the vascular anatomy. Multidetector CT imaging of the abdomen and pelvis was performed using the standard protocol during bolus administration of intravenous contrast. RADIATION DOSE REDUCTION: This exam was performed according to the departmental dose-optimization program which includes automated exposure control, adjustment of the mA and/or kV according to patient size and/or use of iterative reconstruction technique. CONTRAST:  70m OMNIPAQUE IOHEXOL 350 MG/ML SOLN COMPARISON:  None Available. FINDINGS: CTA CHEST FINDINGS Cardiovascular: Thoracic aorta shows atherosclerotic calcifications without aneurysmal dilatation or dissection. No cardiac enlargement is noted. Mild coronary calcifications are seen. The pulmonary artery shows a branching pattern bilaterally. Filling defect to suggest pulmonary embolism is noted. Mediastinum/Nodes: Thoracic inlet shows a dominant 2.5 cm nodule in the right lobe of the thyroid. By history this is been previously biopsied and no further follow-up is recommended. Left thyroidectomy is noted. No hilar or mediastinal adenopathy is noted. Fluid is noted within the esophagus likely related to reflux. A large hiatal hernia is noted. Lungs/Pleura: Mild basilar atelectasis is noted in the lungs bilaterally. No focal infiltrate or effusion is seen. No parenchymal nodules are noted. Musculoskeletal: Degenerative changes of the thoracic spine are noted. No acute rib abnormality is noted. Review of the MIP images confirms the above findings. CT ABDOMEN and PELVIS FINDINGS Hepatobiliary: No focal liver abnormality is seen. Status post cholecystectomy. No biliary dilatation. Pancreas: Unremarkable. No pancreatic ductal dilatation or surrounding inflammatory  changes. Spleen: Normal in size without focal abnormality. Adrenals/Urinary Tract: Adrenal glands are within normal limits. Kidneys demonstrate a normal enhancement pattern bilaterally. Small cysts are noted bilaterally which appear simple in nature stable from prior MRI examination. No further follow-up is recommended. No calculi or obstructive changes are seen. Delayed images demonstrate normal excretion of contrast material. The bladder is partially distended. Stomach/Bowel: Scattered diverticular changes noted with wall thickening. No evidence of diverticulitis is seen. The appendix has been surgically removed. Small bowel and stomach are within normal limits aside from the large hiatal hernia. Vascular/Lymphatic: Aortic atherosclerosis. No enlarged abdominal or pelvic lymph nodes. Reproductive: Status post hysterectomy. No adnexal masses. Other: No abdominal wall hernia or abnormality. No abdominopelvic ascites. Musculoskeletal: Multilevel degenerative changes are noted. Review of the MIP images confirms the above findings. IMPRESSION: CTA of the chest: No evidence of aortic dissection or aneurysmal dilatation. No evidence of pulmonary emboli. Dominant right thyroid nodule stable from recent ultrasound examination. This has been evaluated on previous imaging. (ref: J Am Coll Radiol. 2015 Feb;12(2): 143-50). CT of the abdomen and pelvis: Large hiatal hernia. Diverticulosis without diverticulitis. No other focal abnormality is noted. Electronically Signed   By: MInez CatalinaM.D.   On: 08/14/2022 20:07   (Echo, Carotid, EGD, Colonoscopy, ERCP)    Subjective: Patient seen and examined in the morning rounds.  She was without any complaints but had an episode of diarrhea after drinking orange juice.  She was examined in  the afternoon for discharge readiness.  Patient denies any complaints.  She ate some potato soup and vegetables and denies any complaints.  Did not have to use any nausea medications since  morning.  Eager to go home.  No more diarrhea.   Discharge Exam: Vitals:   08/17/22 1352 08/17/22 1352  BP: (!) 150/84 (!) 150/84  Pulse: 64 63  Resp: 18 18  Temp: 98.5 F (36.9 C) 98.5 F (36.9 C)  SpO2: 91% 91%   Vitals:   08/16/22 2123 08/17/22 0628 08/17/22 1352 08/17/22 1352  BP: (!) 154/87 123/86 (!) 150/84 (!) 150/84  Pulse: 61 (!) 58 64 63  Resp: '14 14 18 18  '$ Temp: 98.4 F (36.9 C) 98.1 F (36.7 C) 98.5 F (36.9 C) 98.5 F (36.9 C)  TempSrc: Oral Oral Oral Oral  SpO2: 94% 94% 91% 91%  Weight:      Height:        General: Pt is alert, awake, not in acute distress Cardiovascular: RRR, S1/S2 +, no rubs, no gallops Respiratory: CTA bilaterally, no wheezing, no rhonchi Abdominal: Soft, NT, ND, bowel sounds + Extremities: no edema, no cyanosis    The results of significant diagnostics from this hospitalization (including imaging, microbiology, ancillary and laboratory) are listed below for reference.     Microbiology: No results found for this or any previous visit (from the past 240 hour(s)).   Labs: BNP (last 3 results) No results for input(s): "BNP" in the last 8760 hours. Basic Metabolic Panel: Recent Labs  Lab 08/14/22 1720 08/15/22 0607 08/16/22 0617  NA 137 134* 140  K 3.9 3.7 3.7  CL 108 105 106  CO2 '26 23 26  '$ GLUCOSE 105* 175* 113*  BUN '18 13 10  '$ CREATININE 1.16* 0.91 0.97  CALCIUM 11.2* 10.1 10.7*  MG  --   --  1.9  PHOS  --   --  2.1*   Liver Function Tests: Recent Labs  Lab 08/14/22 1720 08/16/22 0617  AST 18 17  ALT 16 15  ALKPHOS 111 93  BILITOT 0.8 0.5  PROT 7.7 6.5  ALBUMIN 4.0 3.3*   Recent Labs  Lab 08/14/22 1720  LIPASE 33   No results for input(s): "AMMONIA" in the last 168 hours. CBC: Recent Labs  Lab 08/14/22 1720 08/15/22 0607 08/16/22 0617  WBC 7.2 9.0 6.7  NEUTROABS 5.0  --  4.4  HGB 11.6* 11.2* 11.0*  HCT 38.6 37.5 37.3  MCV 83.7 84.1 85.6  PLT 248 229 223   Cardiac Enzymes: No results for  input(s): "CKTOTAL", "CKMB", "CKMBINDEX", "TROPONINI" in the last 168 hours. BNP: Invalid input(s): "POCBNP" CBG: No results for input(s): "GLUCAP" in the last 168 hours. D-Dimer No results for input(s): "DDIMER" in the last 72 hours. Hgb A1c No results for input(s): "HGBA1C" in the last 72 hours. Lipid Profile No results for input(s): "CHOL", "HDL", "LDLCALC", "TRIG", "CHOLHDL", "LDLDIRECT" in the last 72 hours. Thyroid function studies Recent Labs    08/15/22 0607  TSH 1.243   Anemia work up No results for input(s): "VITAMINB12", "FOLATE", "FERRITIN", "TIBC", "IRON", "RETICCTPCT" in the last 72 hours. Urinalysis    Component Value Date/Time   COLORURINE STRAW (A) 03/09/2022 1954   APPEARANCEUR CLEAR 03/09/2022 1954   LABSPEC 1.021 03/09/2022 1954   PHURINE 7.0 03/09/2022 1954   GLUCOSEU NEGATIVE 03/09/2022 [place] NEGATIVE 03/09/2022 [place] NEGATIVE 03/09/2022 [place] NEGATIVE 03/09/2022 [place] NEGATIVE 03/09/2022 1954  NITRITE POSITIVE (A) 03/09/2022 1954   LEUKOCYTESUR NEGATIVE 03/09/2022 1954   Sepsis Labs Recent Labs  Lab 08/14/22 1720 08/15/22 0607 08/16/22 0617  WBC 7.2 9.0 6.7   Microbiology No results found for this or any previous visit (from the past 240 hour(s)).   Time coordinating discharge: 32 minutes  SIGNED:   Barb Merino, MD  [facility] 08/17/2022, 2:32 PM

## 2022-08-17 NOTE — Progress Notes (Signed)
Subjective: Reports marked improvement in symptoms. Has been able to tolerate clear liquids and wants a diet to be advanced. Denies further nausea or vomiting. Reports resolution of abdominal pain. Had bowel movement yesterday and today morning.  Objective: Vital signs in last 24 hours: Temp:  [97.4 F (36.3 C)-98.4 F (36.9 C)] 98.1 F (36.7 C) (03/09 0628) Pulse Rate:  [58-76] 58 (03/09 0628) Resp:  [14-17] 14 (03/09 0628) BP: (123-161)/(86-94) 123/86 (03/09 0628) SpO2:  [94 %-95 %] 94 % (03/09 0628) Weight change:  Last BM Date : 08/16/22  PE: Appears comfortable, pleasant, well-nourished GENERAL: No obvious pallor or icterus  ABDOMEN: Soft, nondistended, nontender, normoactive bowel sounds EXTREMITIES: No deformity  Lab Results: Results for orders placed or performed during the hospital encounter of 08/14/22 (from the past 48 hour(s))  CBC with Differential/Platelet     Status: Abnormal   Collection Time: 08/16/22  6:17 AM  Result Value Ref Range   WBC 6.7 4.0 - 10.5 K/uL   RBC 4.36 3.87 - 5.11 MIL/uL   Hemoglobin 11.0 (L) 12.0 - 15.0 g/dL   HCT 37.3 36.0 - 46.0 %   MCV 85.6 80.0 - 100.0 fL   MCH 25.2 (L) 26.0 - 34.0 pg   MCHC 29.5 (L) 30.0 - 36.0 g/dL   RDW 16.8 (H) 11.5 - 15.5 %   Platelets 223 150 - 400 K/uL   nRBC 0.0 0.0 - 0.2 %   Neutrophils Relative % 66 %   Neutro Abs 4.4 1.7 - 7.7 K/uL   Lymphocytes Relative 23 %   Lymphs Abs 1.6 0.7 - 4.0 K/uL   Monocytes Relative 8 %   Monocytes Absolute 0.5 0.1 - 1.0 K/uL   Eosinophils Relative 2 %   Eosinophils Absolute 0.1 0.0 - 0.5 K/uL   Basophils Relative 1 %   Basophils Absolute 0.1 0.0 - 0.1 K/uL   Immature Granulocytes 0 %   Abs Immature Granulocytes 0.02 0.00 - 0.07 K/uL    Comment: Performed at [facility], [place] [address] [place], [place] [address]  Comprehensive metabolic panel     Status: Abnormal   Collection Time: 08/16/22  6:17 AM  Result Value Ref Range   Sodium 140 135 - 145  mmol/L   Potassium 3.7 3.5 - 5.1 mmol/L   Chloride 106 98 - 111 mmol/L   CO2 26 22 - 32 mmol/L   Glucose, Bld 113 (H) 70 - 99 mg/dL    Comment: Glucose reference range applies only to samples taken after fasting for at least 8 hours.   BUN 10 8 - 23 mg/dL   Creatinine, Ser 0.97 0.44 - 1.00 mg/dL   Calcium 10.7 (H) 8.9 - 10.3 mg/dL   Total Protein 6.5 6.5 - 8.1 g/dL   Albumin 3.3 (L) 3.5 - 5.0 g/dL   AST 17 15 - 41 U/L   ALT 15 0 - 44 U/L   Alkaline Phosphatase 93 38 - 126 U/L   Total Bilirubin 0.5 0.3 - 1.2 mg/dL   GFR, Estimated >60 >60 mL/min    Comment: (NOTE) Calculated using the CKD-EPI Creatinine Equation (2021)    Anion gap 8 5 - 15    Comment: Performed at [facility], [place] [address], [place],  [address]  Magnesium     Status: None   Collection Time: 08/16/22  6:17 AM  Result Value Ref Range   Magnesium 1.9 1.7 - 2.4 mg/dL    Comment: Performed at [facility], [place] [address], [place], [place] [address]  Phosphorus     Status: Abnormal   Collection Time: 08/16/22  6:17 AM  Result Value Ref Range   Phosphorus 2.1 (L) 2.5 - 4.6 mg/dL    Comment: Performed at [facility], [place] [address], [place],  [address]    Studies/Results: DG UGI W SINGLE CM (SOL OR THIN BA)  Result Date: 08/16/2022 CLINICAL DATA:  77 year old female history of hiatal hernia. Admitted for nausea and vomiting. Team is requesting upper GI for further evaluation for possible hiatal hernia repair EXAM: DG UGI W SINGLE CM TECHNIQUE: Scout radiograph was obtained. Single contrast examination was performed using thin liquid barium. This exam was performed by Rushie Nyhan NPand was supervised and interpreted by Dr. Suzy Bouchard FLUOROSCOPY: Radiation Exposure Index (as provided by the fluoroscopic device): 22.5 mGy Kerma COMPARISON:  None Available. FINDINGS: Scout Radiograph: Nonobstructive bowel gas pattern. Prior cholecystectomy  clips Esophagus:  Tortuous esophagus. Esophageal motility:  Within normal limits. Gastroesophageal reflux:  None visualized. Ingested 92m barium tablet:  Not given Stomach: Normal appearance.  Large hiatal hernia. Approximately 33% stomach is above the hemidiaphragm. Gastric emptying: Normal. Duodenum:  Normal appearance. Other:  None. IMPRESSION: Tortuous esophagus with large hiatal hernia. No mass, strictures or obstructions Electronically Signed   By: SSuzy BouchardM.D.   On: 08/16/2022 12:04    Medications: I have reviewed the patient's current medications.  Assessment: Nausea, vomiting, abdominal pain with tortuous esophagus and a large hiatal hernia  Mild normocytic anemia  Medical comorbidities: Hyperparathyroidism, hypothyroidism, anxiety   Plan: Patient states that she has episodes of nausea, vomiting and abdominal pain once every 1 to 2 months. She takes metoclopramide every 6 hours, initially prescribed by Dr. GPenelope Coopin the past, currently prescribed by primary care physician. Currently reports resolution of symptoms, will advance to full liquid diet for breakfast and lunch and if tolerated regular diet for dinner. Currently doing well on metoclopramide 10 mg IV every 6 hours, Phenergan 12.5 mg IV every 6 hours as needed, Teah/vomiting, 1 mg every 72 hours patch. She has been seen by surgery who recommend outpatient endoscopy. Patient has also not had a colonoscopy over 10 years. Will arrange for outpatient EGD and colonoscopy with [facility]. Hopefully discharge in a.m. tomorrow.  ARonnette Juniper MD 08/17/2022, 8:12 AM

## 2022-08-17 NOTE — Progress Notes (Signed)
   Subjective/Chief Complaint: Tol clears UGI reviewed    Objective: Vital signs in last 24 hours: Temp:  [97.4 F (36.3 C)-98.4 F (36.9 C)] 98.1 F (36.7 C) (03/09 0628) Pulse Rate:  [58-76] 58 (03/09 0628) Resp:  [14-17] 14 (03/09 0628) BP: (123-161)/(86-94) 123/86 (03/09 0628) SpO2:  [94 %-95 %] 94 % (03/09 0628) Last BM Date : 08/16/22  Intake/Output from previous day: 03/08 0701 - 03/09 0700 In: 1608.8 [I.V.:1508.8; IV Piggyback:100] Out: 1150 [Urine:1150] Intake/Output this shift: No intake/output data recorded.  PE:  Constitutional: No acute distress, conversant, appears states age. Eyes: Anicteric sclerae, moist conjunctiva, no lid lag Lungs: Clear to auscultation bilaterally, normal respiratory effort CV: regular rate and rhythm, no murmurs, no peripheral edema, pedal pulses 2+ GI: Soft, no masses or hepatosplenomegaly, non-tender to palpation Skin: No rashes, palpation reveals normal turgor Psychiatric: appropriate judgment and insight, oriented to person, place, and time   Lab Results:  Recent Labs    08/15/22 0607 08/16/22 0617  WBC 9.0 6.7  HGB 11.2* 11.0*  HCT 37.5 37.3  PLT 229 223   BMET Recent Labs    08/15/22 0607 08/16/22 0617  NA 134* 140  K 3.7 3.7  CL 105 106  CO2 23 26  GLUCOSE 175* 113*  BUN 13 10  CREATININE 0.91 0.97  CALCIUM 10.1 10.7*   PT/INR No results for input(s): "LABPROT", "INR" in the last 72 hours. ABG No results for input(s): "PHART", "HCO3" in the last 72 hours.  Invalid input(s): "PCO2", "PO2"  Studies/Results: DG UGI W SINGLE CM (SOL OR THIN BA)  Result Date: 08/16/2022 CLINICAL DATA:  77 year old female history of hiatal hernia. Admitted for nausea and vomiting. Team is requesting upper GI for further evaluation for possible hiatal hernia repair EXAM: DG UGI W SINGLE CM TECHNIQUE: Scout radiograph was obtained. Single contrast examination was performed using thin liquid barium. This exam was performed by  Lynn Jenkins NPand was supervised and interpreted by Dr. Suzy Jenkins FLUOROSCOPY: Radiation Exposure Index (as provided by the fluoroscopic device): 22.5 mGy Kerma COMPARISON:  None Available. FINDINGS: Scout Radiograph: Nonobstructive bowel gas pattern. Prior cholecystectomy clips Esophagus:  Tortuous esophagus. Esophageal motility:  Within normal limits. Gastroesophageal reflux:  None visualized. Ingested 26mm barium tablet:  Not given Stomach: Normal appearance.  Large hiatal hernia. Approximately 33% stomach is above the hemidiaphragm. Gastric emptying: Normal. Duodenum:  Normal appearance. Other:  None. IMPRESSION: Tortuous esophagus with large hiatal hernia. No mass, strictures or obstructions Electronically Signed   By: Lynn Jenkins M.D.   On: 08/16/2022 12:04    Anti-infectives: Anti-infectives (From admission, onward)    None       Assessment/Plan: 17F with large hiatal hernia -OK to adat  -will have pt f/u as outpt per chart to discuss HHR. Will need outpt endoscopy prior to surgery.    This care required straight-forward level of medical decision making.  LOS: 2 days    Lynn Jenkins 08/17/2022

## 2022-08-26 DIAGNOSIS — N183 Chronic kidney disease, stage 3 unspecified: Secondary | ICD-10-CM | POA: Diagnosis not present

## 2022-08-26 DIAGNOSIS — E039 Hypothyroidism, unspecified: Secondary | ICD-10-CM | POA: Diagnosis not present

## 2022-08-26 DIAGNOSIS — D72829 Elevated white blood cell count, unspecified: Secondary | ICD-10-CM | POA: Diagnosis not present

## 2022-08-26 DIAGNOSIS — E559 Vitamin D deficiency, unspecified: Secondary | ICD-10-CM | POA: Diagnosis not present

## 2022-08-26 DIAGNOSIS — R112 Nausea with vomiting, unspecified: Secondary | ICD-10-CM | POA: Diagnosis not present

## 2022-08-26 DIAGNOSIS — E210 Primary hyperparathyroidism: Secondary | ICD-10-CM | POA: Diagnosis not present

## 2022-08-26 DIAGNOSIS — E782 Mixed hyperlipidemia: Secondary | ICD-10-CM | POA: Diagnosis not present

## 2022-08-26 DIAGNOSIS — M329 Systemic lupus erythematosus, unspecified: Secondary | ICD-10-CM | POA: Diagnosis not present

## 2022-08-29 DIAGNOSIS — I700 Atherosclerosis of aorta: Secondary | ICD-10-CM | POA: Diagnosis not present

## 2022-08-29 DIAGNOSIS — Z0000 Encounter for general adult medical examination without abnormal findings: Secondary | ICD-10-CM | POA: Diagnosis not present

## 2022-08-29 DIAGNOSIS — M064 Inflammatory polyarthropathy: Secondary | ICD-10-CM | POA: Diagnosis not present

## 2022-08-29 DIAGNOSIS — K3184 Gastroparesis: Secondary | ICD-10-CM | POA: Diagnosis not present

## 2022-08-29 DIAGNOSIS — G43009 Migraine without aura, not intractable, without status migrainosus: Secondary | ICD-10-CM | POA: Diagnosis not present

## 2022-08-29 DIAGNOSIS — M329 Systemic lupus erythematosus, unspecified: Secondary | ICD-10-CM | POA: Diagnosis not present

## 2022-08-29 DIAGNOSIS — D509 Iron deficiency anemia, unspecified: Secondary | ICD-10-CM | POA: Diagnosis not present

## 2022-08-29 DIAGNOSIS — N183 Chronic kidney disease, stage 3 unspecified: Secondary | ICD-10-CM | POA: Diagnosis not present

## 2022-09-10 ENCOUNTER — Ambulatory Visit: Payer: Self-pay | Admitting: General Surgery

## 2022-09-10 DIAGNOSIS — K449 Diaphragmatic hernia without obstruction or gangrene: Secondary | ICD-10-CM | POA: Diagnosis not present

## 2022-09-10 NOTE — H&P (Signed)
Chief Complaint: RE-CHECK (Hital Hernia - c/o discomfort, nausea, feeling full and reflux.)       History of Present Illness: Lynn Jenkins is a 77 y.o. female who is seen today as an office consultation at the request of Dr. Harrington Challenger for evaluation of RE-CHECK (Hital Hernia - c/o discomfort, nausea, feeling full and reflux.) .   Patient is a 77 year old female who comes in secondary to a known history of hiatal hernia when Lynn Jenkins was recently in the hospital.  Patient had some dysphagia.  There was concern for gastric outlet obstruction.   On CT scan Lynn Jenkins had approximate 3.5 cm hiatal hernia.  This appeared to be a type I hiatal hernia.  Patient states Lynn Jenkins been doing well after being out of the hospital.  Lynn Jenkins has had no symptoms aphasias.  Lynn Jenkins states that Lynn Jenkins has had 20+ years of reflux.  Lynn Jenkins states that Lynn Jenkins has reflux throughout the day.   Lynn Jenkins had previous lap chole in the past.         Review of Systems: A complete review of systems was obtained from the patient.  I have reviewed this information and discussed as appropriate with the patient.  See HPI as well for other ROS.   Review of Systems  Constitutional:  Negative for fever.  HENT:  Negative for congestion.   Eyes:  Negative for blurred vision.  Respiratory:  Negative for cough, shortness of breath and wheezing.   Cardiovascular:  Negative for chest pain and palpitations.  Gastrointestinal:  Positive for heartburn.  Genitourinary:  Negative for dysuria.  Musculoskeletal:  Negative for myalgias.  Skin:  Negative for rash.  Neurological:  Negative for dizziness and headaches.  Psychiatric/Behavioral:  Negative for depression and suicidal ideas.   All other systems reviewed and are negative.       Medical History: Past Medical History Past Medical History: Diagnosis Date  Acid reflux    Arthritis    Glaucoma (increased eye pressure)    History of cataract    Lupus (CMS/HHS-HCC)    Thyroid disease     parathyroid  Vision  abnormalities        Patient Active Problem List Diagnosis  Primary open angle glaucoma (POAG) of left eye, severe stage  Primary open angle glaucoma (POAG) of right eye, moderate stage  Combined form of age-related cataract, right eye  CME (cystoid macular edema), left  Cornea guttata  Uveitis of left eye  Systemic lupus erythematosus (CMS/HHS-HCC)  Leukocytosis  Microcytic anemia  Pain of left hand  Primary hyperparathyroidism (CMS/HHS-HCC)  Varicose veins of bilateral lower extremities with other complications  History of lobectomy of thyroid  Hypothyroidism, iatrogenic  Postsurgical states following surgery of eye and adnexa  Disorder of bone  Fibromyalgia  Glaucoma  Hypercalcemia  Inflammatory arthritis  Osteoarthritis  Connective tissue disease (CMS/HHS-HCC)  Lupus (CMS/HHS-HCC)  Obesity  Status post parathyroidectomy     Past Surgical History Past Surgical History: Procedure Laterality Date  Shoulder Sx Right 1982  GLAUCOMA EYE SURGERY Left 10/19/2018   [facility], Dr. Arlina Robes  LENS EYE SURGERY Left 10/19/2018   CE IOL OS, [facility]  Dr. Arlina Robes  thyroid surgery   10/2020  GLAUCOMA EYE SURGERY Left 03/01/2021   [facility] Dr Ander Slade  GLAUCOMA EYE SURGERY Bilateral     Laser OU Dr,. Bevis      Allergies Allergies Allergen Reactions  Doxycycline Swelling     Facial swelling, itching  Other Swelling  Zolmitriptan Nausea And Vomiting  Increased heart rate  Increased heart rate     Amoxicillin-Pot Clavulanate Diarrhea  Cefuroxime Axetil Other (See Comments)     Stomach ache  Gabapentin Other (See Comments)     Confusion, off balance  Ferrous Sulfate Diarrhea  Methazolamide Diarrhea  Alphagan [Brimonidine] Itching  Cefdinir Rash  Clarithromycin Rash  Dorzolamide Itching  Lumigan [Bimatoprost] Itching  Morphine Swelling  Sumatriptan Other (See Comments)     Can use brand Imitrex  Travatan [Travoprost (Benzalkonium)] Itching      Current Outpatient  Medications on File Prior to Visit Medication Sig Dispense Refill  acetaminophen (TYLENOL) 500 MG tablet Take 500 mg by mouth every 6 (six) hours as needed         ascorbic acid, vitamin C, (VITAMIN C) 500 MG tablet Take 500 mg by mouth once daily         atorvastatin (LIPITOR) 20 MG tablet TK 1 T PO QD      atropine 1 % ophthalmic solution        bromfenac (PROLENSA) 0.07 % ophthalmic solution Place 1 drop into the left eye once daily 3 mL 1  cholecalciferol (VITAMIN D3) 1,000 unit tablet Take 1,000 Units by mouth once daily         co-enzyme Q-10, ubiquinone, 30 mg capsule Take 30 mg by mouth once daily         cyanocobalamin (VITAMIN B12) 1000 MCG tablet Take 1,000 mcg by mouth once daily         cyclopentolate (CYCLOGYL) 1 % ophthalmic solution Place 1 drop into the left eye 2 (two) times daily 5 mL 1  docosahexaenoic acid-epa 120-180 mg Cap Take 1 capsule by mouth once daily         docusate (COLACE) 250 MG capsule Take 250 mg by mouth once daily         dorzolamide-timolol (COSOPT PF) 2-0.5 % ophthalmic solution Place 1 drop into the right eye 2 (two) times daily 60 Blister Pack 12  dorzolamide-timoloL (COSOPT) 22.3-6.8 mg/mL ophthalmic solution Place 1 drop into the right eye 2 (two) times daily 30 mL 4  esomeprazole (NEXIUM) 40 MG DR capsule Take 40 mg by mouth once daily         fenofibrate 160 MG tablet Take 160 mg by mouth once daily         ferrous sulfate 325 (65 FE) MG tablet Take 325 mg by mouth daily with breakfast         FUROsemide (LASIX) 20 MG tablet Take 20 mg by mouth once daily         glucosam su dip-chondroit-C-Mn 500-400-66-3 mg Cap Take 1 tablet by mouth once daily         hydrOXYchloroQUINE (PLAQUENIL) 200 mg tablet        latanoprost (XALATAN) 0.005 % ophthalmic solution INSTILL ONE DROP TO BOTH EYES EVERY NIGHT AT BEDTIME 7.5 mL 1  meloxicam (MOBIC) 15 MG tablet Take 15 mg by mouth once daily      metoclopramide (REGLAN) 10 MG tablet Take by mouth nightly as  needed      moxifloxacin (VIGAMOX) 0.5 % ophthalmic solution        netarsudiL (RHOPRESSA) 0.02 % eye drops Place 1 drop into the left eye nightly 7.5 mL 4  ondansetron (ZOFRAN-ODT) 4 MG disintegrating tablet Take by mouth nightly as needed      pravastatin (PRAVACHOL) 20 MG tablet Take 20 mg by mouth nightly      prednisoLONE acetate (  PRED FORTE) 1 % ophthalmic suspension Place 1 drop into the left eye 4 (four) times daily 10 mL 1  predniSONE (DELTASONE) 5 MG tablet        sertraline (ZOLOFT) 100 MG tablet Take 100 mg by mouth once daily         sodium chloride (MURO 128) 5 % ophthalmic ointment Apply a thin strip to the eye at bedtime 3.5 g 3  SUMAtriptan (IMITREX) 100 MG tablet Take 100 mg by mouth once as needed         timoloL maleate (TIMOPTIC) 0.5 % ophthalmic solution Place 1 drop into the right eye 2 (two) times daily 10 mL 11  brimonidine (ALPHAGAN) 0.2 % ophthalmic solution Place 1 drop into the left eye 3 (three) times daily 20 mL 4  levothyroxine (SYNTHROID) 50 MCG tablet Take 1 tablet (50 mcg total) by mouth once daily Take on an empty stomach with a glass of water at least 30-60 minutes before breakfast. 30 tablet 11  methazolAMIDE (NEPTAZANE) 50 MG tablet Take 1 tablet (50 mg total) by mouth 3 (three) times daily (Patient not taking: Reported on 04/27/2021) 90 tablet 11   No current facility-administered medications on file prior to visit.     Family History Family History Problem Relation Age of Onset  High blood pressure (Hypertension) Father    Cancer Brother        Social History   Tobacco Use Smoking Status Never Smokeless Tobacco Never     Social History Social History    Socioeconomic History  Marital status: Married Tobacco Use  Smoking status: Never  Smokeless tobacco: Never Vaping Use  Vaping Use: Never used Substance and Sexual Activity  Alcohol use: Not Currently  Drug use: Never      Objective:     Vitals:   09/10/22  1055 PainSc: 0-No pain   There is no height or weight on file to calculate BMI.   Physical Exam Constitutional:      Appearance: Normal appearance.  HENT:     Head: Normocephalic and atraumatic.     Mouth/Throat:     Mouth: Mucous membranes are moist.     Pharynx: Oropharynx is clear.  Eyes:     General: No scleral icterus.    Pupils: Pupils are equal, round, and reactive to light.  Cardiovascular:     Rate and Rhythm: Normal rate and regular rhythm.     Pulses: Normal pulses.     Heart sounds: No murmur heard.    No friction rub. No gallop.  Pulmonary:     Effort: Pulmonary effort is normal. No respiratory distress.     Breath sounds: Normal breath sounds. No stridor.  Abdominal:     General: Abdomen is flat.  Musculoskeletal:        General: No swelling.  Skin:    General: Skin is warm.  Neurological:     General: No focal deficit present.     Mental Status: Lynn Jenkins is alert and oriented to person, place, and time. Mental status is at baseline.  Psychiatric:        Mood and Affect: Mood normal.        Thought Content: Thought content normal.        Judgment: Judgment normal.        Assessment and Plan: Diagnoses and all orders for this visit:   Hiatal hernia     Siclaly Hanisch is a 77 y.o. female    1.  We will  proceed to the OR for a robotic hiatal hernia repair with mesh and fundoplication 2. All risks and benefits were discussed with the patient, to generally include infection, bleeding, damage to surrounding structures, possible pneumothorax, and recurrence. Alternatives were offered and described.  All questions were answered and the patient voiced understanding of the procedure and wishes to proceed at this point.

## 2022-10-07 DIAGNOSIS — H18513 Endothelial corneal dystrophy, bilateral: Secondary | ICD-10-CM | POA: Diagnosis not present

## 2022-10-07 DIAGNOSIS — H401112 Primary open-angle glaucoma, right eye, moderate stage: Secondary | ICD-10-CM | POA: Diagnosis not present

## 2022-10-07 DIAGNOSIS — H401123 Primary open-angle glaucoma, left eye, severe stage: Secondary | ICD-10-CM | POA: Diagnosis not present

## 2022-10-07 DIAGNOSIS — H35352 Cystoid macular degeneration, left eye: Secondary | ICD-10-CM | POA: Diagnosis not present

## 2022-10-08 NOTE — Pre-Procedure Instructions (Signed)
Surgical Instructions    Your procedure is scheduled on Oct 15, 2022.  Report to [facility] Main Entrance "A" at 5:30 A.M., then check in with the Admitting office.  Call this number if you have problems the morning of surgery:  [phone]  If you have any questions prior to your surgery date call [phone]: Open Monday-Friday 8am-4pm If you experience any cold or flu symptoms such as cough, fever, chills, shortness of breath, etc. between now and your scheduled surgery, please notify us at the above number.     Remember:  Do not eat after midnight the night before your surgery  You may drink clear liquids until 4:30 AM the morning of your surgery.   Clear liquids allowed are: Water, Non-Citrus Juices (without pulp), Carbonated Beverages, Clear Tea, Black Coffee Only (NO MILK, CREAM OR POWDERED CREAMER of any kind), and Gatorade.  Patient Instructions  The night before surgery:  No food after midnight. ONLY clear liquids after midnight  The day of surgery (if you do NOT have diabetes):  Drink ONE (1) Pre-Surgery Clear Ensure by 4:30 AM the morning of surgery. Drink in one sitting. Do not sip.  This drink was given to you during your hospital  pre-op appointment visit.  Nothing else to drink after completing the  Pre-Surgery Clear Ensure.         If you have questions, please contact your surgeon's office.     Take these medicines the morning of surgery with A SIP OF WATER:  esomeprazole (NEXIUM)   levothyroxine (SYNTHROID)   REFRESH PLUS eye drops  sertraline (ZOLOFT)   timolol (TIMOPTIC) ophthalmic solution    May take these medicines IF NEEDED:  acetaminophen (TYLENOL)   loperamide (IMODIUM A-D)   metoCLOPramide (REGLAN)   ondansetron (ZOFRAN-ODT)   SUMAtriptan (IMITREX)   traMADol (ULTRAM)    As of today, STOP taking any Aspirin (unless otherwise instructed by your surgeon) Aleve, Naproxen, Ibuprofen, Motrin, Advil, Goody's, BC's, all herbal medications, fish  oil, and all vitamins.                     Do NOT Smoke (Tobacco/Vaping) for 24 hours prior to your procedure.  If you use a CPAP at night, you may bring your mask/headgear for your overnight stay.   Contacts, glasses, piercing's, hearing aid's, dentures or partials may not be worn into surgery, please bring cases for these belongings.    For patients admitted to the hospital, discharge time will be determined by your treatment team.   Patients discharged the day of surgery will not be allowed to drive home, and someone needs to stay with them for 24 hours.  SURGICAL WAITING ROOM VISITATION Patients having surgery or a procedure may have no more than 2 support people in the waiting area - these visitors may rotate.   Children under the age of 30 must have an adult with them who is not the patient. If the patient needs to stay at the hospital during part of their recovery, the visitor guidelines for inpatient rooms apply. Pre-op nurse will coordinate an appropriate time for 1 support person to accompany patient in pre-op.  This support person may not rotate.   Please refer to the [facility] website for the visitor guidelines for Inpatients (after your surgery is over and you are in a regular room).    Special instructions:   - Preparing For Surgery  Before surgery, you can play an important role. Because skin is  not sterile, your skin needs to be as free of germs as possible. You can reduce the number of germs on your skin by washing with CHG (chlorahexidine gluconate) Soap before surgery.  CHG is an antiseptic cleaner which kills germs and bonds with the skin to continue killing germs even after washing.    Oral Hygiene is also important to reduce your risk of infection.  Remember - BRUSH YOUR TEETH THE MORNING OF SURGERY WITH YOUR REGULAR TOOTHPASTE  Please do not use if you have an allergy to CHG or antibacterial soaps. If your skin becomes reddened/irritated stop using the  CHG.  Do not shave (including legs and underarms) for at least 48 hours prior to first CHG shower. It is OK to shave your face.  Please follow these instructions carefully.   Shower the NIGHT BEFORE SURGERY and the MORNING OF SURGERY  If you chose to wash your hair, wash your hair first as usual with your normal shampoo.  After you shampoo, rinse your hair and body thoroughly to remove the shampoo.  Use CHG Soap as you would any other liquid soap. You can apply CHG directly to the skin and wash gently with a scrungie or a clean washcloth.   Apply the CHG Soap to your body ONLY FROM THE NECK DOWN.  Do not use on open wounds or open sores. Avoid contact with your eyes, ears, mouth and genitals (private parts). Wash Face and genitals (private parts)  with your normal soap.   Wash thoroughly, paying special attention to the area where your surgery will be performed.  Thoroughly rinse your body with warm water from the neck down.  DO NOT shower/wash with your normal soap after using and rinsing off the CHG Soap.  Pat yourself dry with a CLEAN TOWEL.  Wear CLEAN PAJAMAS to bed the night before surgery  Place CLEAN SHEETS on your bed the night before your surgery  DO NOT SLEEP WITH PETS.   Day of Surgery: Take a shower with CHG soap. Do not wear jewelry or makeup Do not wear lotions, powders, perfumes/colognes, or deodorant. Do not shave 48 hours prior to surgery.  Men may shave face and neck. Do not bring valuables to the hospital.  [facility] is not responsible for any belongings or valuables. Do not wear nail polish, gel polish, artificial nails, or any other type of covering on natural nails (fingers and toes) If you have artificial nails or gel coating that need to be removed by a nail salon, please have this removed prio  Wear Clean/Comfortable clothing the morning of surgery Remember to brush your teeth WITH YOUR REGULAR TOOTHPASTE.   Please read over the following fact  sheets that you were given.    If you received a COVID test during your pre-op visit  it is requested that you wear a mask when out in public, stay away from anyone that may not be feeling well and notify your surgeon if you develop symptoms. If you have been in contact with anyone that has tested positive in the last 10 days please notify you surgeon.

## 2022-10-09 ENCOUNTER — Encounter (HOSPITAL_COMMUNITY)
Admission: RE | Admit: 2022-10-09 | Discharge: 2022-10-09 | Disposition: A | Discharge status: Home / Self Care | Payer: Medicare Other | Source: Ambulatory Visit | Attending: General Surgery | Admitting: General Surgery

## 2022-10-09 ENCOUNTER — Other Ambulatory Visit: Payer: Self-pay

## 2022-10-09 ENCOUNTER — Encounter (HOSPITAL_COMMUNITY): Payer: Self-pay

## 2022-10-09 VITALS — BP 159/99 | HR 65 | Temp 98.2°F | Resp 17 | Ht 63.0 in | Wt 203.8 lb

## 2022-10-09 DIAGNOSIS — Z01818 Encounter for other preprocedural examination: Secondary | ICD-10-CM | POA: Diagnosis not present

## 2022-10-09 LAB — CBC
HCT: 36.3 % (ref 36.0–46.0)
Hemoglobin: 10.7 g/dL — ABNORMAL LOW (ref 12.0–15.0)
MCH: 24.8 pg — ABNORMAL LOW (ref 26.0–34.0)
MCHC: 29.5 g/dL — ABNORMAL LOW (ref 30.0–36.0)
MCV: 84.2 fL (ref 80.0–100.0)
Platelets: 201 10*3/uL (ref 150–400)
RBC: 4.31 MIL/uL (ref 3.87–5.11)
RDW: 16.3 % — ABNORMAL HIGH (ref 11.5–15.5)
WBC: 6.3 10*3/uL (ref 4.0–10.5)
nRBC: 0 % (ref 0.0–0.2)

## 2022-10-09 NOTE — Progress Notes (Signed)
PCP - Darlen Round Ross,MD Cardiologist - denies  PPM/ICD - denies Device Orders -  Rep Notified -   Chest x-ray - none EKG - 08/16/22 Stress Test - none ECHO - none Cardiac Cath - none  Sleep Study - none CPAP - none  Fasting Blood Sugar - na Checks Blood Sugar _____ times a day  Last dose of GLP1 agonist-  na GLP1 instructions:   Blood Thinner Instructions:na Aspirin Instructions:na  ERAS Protcol -clears until 0430 PRE-SURGERY Ensure or G2- Ensure  COVID TEST- na   Anesthesia review: no  Patient denies shortness of breath, fever, cough and chest pain at PAT appointment   All instructions explained to the patient, with a verbal understanding of the material. Patient agrees to go over the instructions while at home for a better understanding. Patient also instructed to wear a mask when out in public prior to surgery. The opportunity to ask questions was provided.

## 2022-10-14 NOTE — H&P (Signed)
Chief Complaint: RE-CHECK (Hital Hernia - c/o discomfort, nausea, feeling full and reflux.)       History of Present Illness: Lynn Jenkins is a 76 y.o. female who is seen today as an office consultation at the request of Dr. Ross for evaluation of RE-CHECK (Hital Hernia - c/o discomfort, nausea, feeling full and reflux.) .   Patient is a 76-year-old female who comes in secondary to a known history of hiatal hernia when she was recently in the hospital.  Patient had some dysphagia.  There was concern for gastric outlet obstruction.   On CT scan she had approximate 3.5 cm hiatal hernia.  This appeared to be a type I hiatal hernia.  Patient states she been doing well after being out of the hospital.  She has had no symptoms aphasias.  She states that she has had 20+ years of reflux.  She states that she has reflux throughout the day.   She had previous lap chole in the past.         Review of Systems: A complete review of systems was obtained from the patient.  I have reviewed this information and discussed as appropriate with the patient.  See HPI as well for other ROS.   Review of Systems  Constitutional:  Negative for fever.  HENT:  Negative for congestion.   Eyes:  Negative for blurred vision.  Respiratory:  Negative for cough, shortness of breath and wheezing.   Cardiovascular:  Negative for chest pain and palpitations.  Gastrointestinal:  Positive for heartburn.  Genitourinary:  Negative for dysuria.  Musculoskeletal:  Negative for myalgias.  Skin:  Negative for rash.  Neurological:  Negative for dizziness and headaches.  Psychiatric/Behavioral:  Negative for depression and suicidal ideas.   All other systems reviewed and are negative.       Medical History: Past Medical History Past Medical History: Diagnosis Date  Acid reflux    Arthritis    Glaucoma (increased eye pressure)    History of cataract    Lupus (CMS/HHS-HCC)    Thyroid disease     parathyroid  Vision  abnormalities        Patient Active Problem List Diagnosis  Primary open angle glaucoma (POAG) of left eye, severe stage  Primary open angle glaucoma (POAG) of right eye, moderate stage  Combined form of age-related cataract, right eye  CME (cystoid macular edema), left  Cornea guttata  Uveitis of left eye  Systemic lupus erythematosus (CMS/HHS-HCC)  Leukocytosis  Microcytic anemia  Pain of left hand  Primary hyperparathyroidism (CMS/HHS-HCC)  Varicose veins of bilateral lower extremities with other complications  History of lobectomy of thyroid  Hypothyroidism, iatrogenic  Postsurgical states following surgery of eye and adnexa  Disorder of bone  Fibromyalgia  Glaucoma  Hypercalcemia  Inflammatory arthritis  Osteoarthritis  Connective tissue disease (CMS/HHS-HCC)  Lupus (CMS/HHS-HCC)  Obesity  Status post parathyroidectomy     Past Surgical History Past Surgical History: Procedure Laterality Date  Shoulder Sx Right 1982  GLAUCOMA EYE SURGERY Left 10/19/2018   [facility], Dr. Ding  LENS EYE SURGERY Left 10/19/2018   CE IOL OS, [facility]  Dr. Ding  thyroid surgery   10/2020  GLAUCOMA EYE SURGERY Left 03/01/2021   [facility] Dr Moya  GLAUCOMA EYE SURGERY Bilateral     Laser OU Dr,. Bevis      Allergies Allergies Allergen Reactions  Doxycycline Swelling     Facial swelling, itching  Other Swelling  Zolmitriptan Nausea And Vomiting       Increased heart rate  Increased heart rate     Amoxicillin-Pot Clavulanate Diarrhea  Cefuroxime Axetil Other (See Comments)     Stomach ache  Gabapentin Other (See Comments)     Confusion, off balance  Ferrous Sulfate Diarrhea  Methazolamide Diarrhea  Alphagan [Brimonidine] Itching  Cefdinir Rash  Clarithromycin Rash  Dorzolamide Itching  Lumigan [Bimatoprost] Itching  Morphine Swelling  Sumatriptan Other (See Comments)     Can use brand Imitrex  Travatan [Travoprost (Benzalkonium)] Itching      Current Outpatient  Medications on File Prior to Visit Medication Sig Dispense Refill  acetaminophen (TYLENOL) 500 MG tablet Take 500 mg by mouth every 6 (six) hours as needed         ascorbic acid, vitamin C, (VITAMIN C) 500 MG tablet Take 500 mg by mouth once daily         atorvastatin (LIPITOR) 20 MG tablet TK 1 T PO QD      atropine 1 % ophthalmic solution        bromfenac (PROLENSA) 0.07 % ophthalmic solution Place 1 drop into the left eye once daily 3 mL 1  cholecalciferol (VITAMIN D3) 1,000 unit tablet Take 1,000 Units by mouth once daily         co-enzyme Q-10, ubiquinone, 30 mg capsule Take 30 mg by mouth once daily         cyanocobalamin (VITAMIN B12) 1000 MCG tablet Take 1,000 mcg by mouth once daily         cyclopentolate (CYCLOGYL) 1 % ophthalmic solution Place 1 drop into the left eye 2 (two) times daily 5 mL 1  docosahexaenoic acid-epa 120-180 mg Cap Take 1 capsule by mouth once daily         docusate (COLACE) 250 MG capsule Take 250 mg by mouth once daily         dorzolamide-timolol (COSOPT PF) 2-0.5 % ophthalmic solution Place 1 drop into the right eye 2 (two) times daily 60 Blister Pack 12  dorzolamide-timoloL (COSOPT) 22.3-6.8 mg/mL ophthalmic solution Place 1 drop into the right eye 2 (two) times daily 30 mL 4  esomeprazole (NEXIUM) 40 MG DR capsule Take 40 mg by mouth once daily         fenofibrate 160 MG tablet Take 160 mg by mouth once daily         ferrous sulfate 325 (65 FE) MG tablet Take 325 mg by mouth daily with breakfast         FUROsemide (LASIX) 20 MG tablet Take 20 mg by mouth once daily         glucosam su dip-chondroit-C-Mn 500-400-66-3 mg Cap Take 1 tablet by mouth once daily         hydrOXYchloroQUINE (PLAQUENIL) 200 mg tablet        latanoprost (XALATAN) 0.005 % ophthalmic solution INSTILL ONE DROP TO BOTH EYES EVERY NIGHT AT BEDTIME 7.5 mL 1  meloxicam (MOBIC) 15 MG tablet Take 15 mg by mouth once daily      metoclopramide (REGLAN) 10 MG tablet Take by mouth nightly as  needed      moxifloxacin (VIGAMOX) 0.5 % ophthalmic solution        netarsudiL (RHOPRESSA) 0.02 % eye drops Place 1 drop into the left eye nightly 7.5 mL 4  ondansetron (ZOFRAN-ODT) 4 MG disintegrating tablet Take by mouth nightly as needed      pravastatin (PRAVACHOL) 20 MG tablet Take 20 mg by mouth nightly      prednisoLONE acetate (  PRED FORTE) 1 % ophthalmic suspension Place 1 drop into the left eye 4 (four) times daily 10 mL 1  predniSONE (DELTASONE) 5 MG tablet        sertraline (ZOLOFT) 100 MG tablet Take 100 mg by mouth once daily         sodium chloride (MURO 128) 5 % ophthalmic ointment Apply a thin strip to the eye at bedtime 3.5 g 3  SUMAtriptan (IMITREX) 100 MG tablet Take 100 mg by mouth once as needed         timoloL maleate (TIMOPTIC) 0.5 % ophthalmic solution Place 1 drop into the right eye 2 (two) times daily 10 mL 11  brimonidine (ALPHAGAN) 0.2 % ophthalmic solution Place 1 drop into the left eye 3 (three) times daily 20 mL 4  levothyroxine (SYNTHROID) 50 MCG tablet Take 1 tablet (50 mcg total) by mouth once daily Take on an empty stomach with a glass of water at least 30-60 minutes before breakfast. 30 tablet 11  methazolAMIDE (NEPTAZANE) 50 MG tablet Take 1 tablet (50 mg total) by mouth 3 (three) times daily (Patient not taking: Reported on 04/27/2021) 90 tablet 11   No current facility-administered medications on file prior to visit.     Family History Family History Problem Relation Age of Onset  High blood pressure (Hypertension) Father    Cancer Brother        Social History   Tobacco Use Smoking Status Never Smokeless Tobacco Never     Social History Social History    Socioeconomic History  Marital status: Married Tobacco Use  Smoking status: Never  Smokeless tobacco: Never Vaping Use  Vaping Use: Never used Substance and Sexual Activity  Alcohol use: Not Currently  Drug use: Never      Objective:     Vitals:   09/10/22  1055 PainSc: 0-No pain   There is no height or weight on file to calculate BMI.   Physical Exam Constitutional:      Appearance: Normal appearance.  HENT:     Head: Normocephalic and atraumatic.     Mouth/Throat:     Mouth: Mucous membranes are moist.     Pharynx: Oropharynx is clear.  Eyes:     General: No scleral icterus.    Pupils: Pupils are equal, round, and reactive to light.  Cardiovascular:     Rate and Rhythm: Normal rate and regular rhythm.     Pulses: Normal pulses.     Heart sounds: No murmur heard.    No friction rub. No gallop.  Pulmonary:     Effort: Pulmonary effort is normal. No respiratory distress.     Breath sounds: Normal breath sounds. No stridor.  Abdominal:     General: Abdomen is flat.  Musculoskeletal:        General: No swelling.  Skin:    General: Skin is warm.  Neurological:     General: No focal deficit present.     Mental Status: She is alert and oriented to person, place, and time. Mental status is at baseline.  Psychiatric:        Mood and Affect: Mood normal.        Thought Content: Thought content normal.        Judgment: Judgment normal.        Assessment and Plan: Diagnoses and all orders for this visit:   Hiatal hernia     Lynn Jenkins is a 76 y.o. female    1.  We will   proceed to the OR for a robotic hiatal hernia repair with mesh and fundoplication 2. All risks and benefits were discussed with the patient, to generally include infection, bleeding, damage to surrounding structures, possible pneumothorax, and recurrence. Alternatives were offered and described.  All questions were answered and the patient voiced understanding of the procedure and wishes to proceed at this point.  

## 2022-10-14 NOTE — Anesthesia Preprocedure Evaluation (Signed)
Anesthesia Evaluation  Patient identified by MRN, date of birth, ID band Patient awake    Reviewed: Allergy & Precautions, NPO status , Patient's Chart, lab work & pertinent test results  History of Anesthesia Complications Negative for: history of anesthetic complications  Airway Mallampati: II  TM Distance: >3 FB Neck ROM: Full    Dental no notable dental hx. (+) Dental Advisory Given   Pulmonary neg pulmonary ROS   Pulmonary exam normal        Cardiovascular negative cardio ROS Normal cardiovascular exam     Neuro/Psych  Headaches PSYCHIATRIC DISORDERS Anxiety        GI/Hepatic Neg liver ROS, hiatal hernia,GERD  Medicated and Controlled,, Cholecystitis    Endo/Other  Hypothyroidism  SLE Obesity Primary hyperparathyroidism   Renal/GU negative Renal ROS     Musculoskeletal  (+) Arthritis ,    Abdominal   Peds  Hematology  (+) Blood dyscrasia, anemia   Anesthesia Other Findings Lupus   Reproductive/Obstetrics                             Anesthesia Physical Anesthesia Plan  ASA: 2  Anesthesia Plan: General   Post-op Pain Management: Tylenol PO (pre-op)* and Toradol IV (intra-op)*   Induction: Intravenous, Rapid sequence and Cricoid pressure planned  PONV Risk Score and Plan: 4 or greater and Treatment may vary due to age or medical condition, Ondansetron, Dexamethasone, Propofol infusion and Scopolamine patch - Pre-op  Airway Management Planned: Oral ETT  Additional Equipment: None  Intra-op Plan:   Post-operative Plan: Extubation in OR  Informed Consent: I have reviewed the patients History and Physical, chart, labs and discussed the procedure including the risks, benefits and alternatives for the proposed anesthesia with the patient or authorized representative who has indicated his/her understanding and acceptance.     Dental advisory given  Plan Discussed with:  Anesthesiologist and CRNA  Anesthesia Plan Comments:         Anesthesia Quick Evaluation

## 2022-10-15 ENCOUNTER — Other Ambulatory Visit: Payer: Self-pay

## 2022-10-15 ENCOUNTER — Encounter (HOSPITAL_COMMUNITY): Payer: Self-pay | Admitting: General Surgery

## 2022-10-15 ENCOUNTER — Observation Stay (HOSPITAL_COMMUNITY)
Admission: RE | Admit: 2022-10-15 | Discharge: 2022-10-17 | Disposition: A | Discharge status: Home / Self Care | Payer: Medicare Other | Attending: General Surgery | Admitting: General Surgery

## 2022-10-15 ENCOUNTER — Ambulatory Visit (HOSPITAL_COMMUNITY): Payer: Medicare Other | Admitting: Certified Registered Nurse Anesthetist

## 2022-10-15 ENCOUNTER — Encounter (HOSPITAL_COMMUNITY)
Admission: RE | Disposition: A | Discharge status: Home / Self Care | Payer: Self-pay | Source: Home / Self Care | Attending: General Surgery

## 2022-10-15 ENCOUNTER — Ambulatory Visit (HOSPITAL_BASED_OUTPATIENT_CLINIC_OR_DEPARTMENT_OTHER): Payer: Medicare Other | Admitting: Certified Registered Nurse Anesthetist

## 2022-10-15 DIAGNOSIS — Z9889 Other specified postprocedural states: Secondary | ICD-10-CM | POA: Diagnosis present

## 2022-10-15 DIAGNOSIS — E210 Primary hyperparathyroidism: Secondary | ICD-10-CM

## 2022-10-15 DIAGNOSIS — E669 Obesity, unspecified: Secondary | ICD-10-CM

## 2022-10-15 DIAGNOSIS — K449 Diaphragmatic hernia without obstruction or gangrene: Secondary | ICD-10-CM

## 2022-10-15 DIAGNOSIS — Z6834 Body mass index (BMI) 34.0-34.9, adult: Secondary | ICD-10-CM

## 2022-10-15 DIAGNOSIS — F419 Anxiety disorder, unspecified: Secondary | ICD-10-CM | POA: Diagnosis not present

## 2022-10-15 DIAGNOSIS — E039 Hypothyroidism, unspecified: Secondary | ICD-10-CM | POA: Diagnosis not present

## 2022-10-15 DIAGNOSIS — D638 Anemia in other chronic diseases classified elsewhere: Secondary | ICD-10-CM | POA: Diagnosis not present

## 2022-10-15 HISTORY — PX: XI ROBOTIC ASSISTED HIATAL HERNIA REPAIR: SHX6889

## 2022-10-15 HISTORY — PX: INSERTION OF MESH: SHX5868

## 2022-10-15 SURGERY — REPAIR, HERNIA, HIATAL, ROBOT-ASSISTED
Anesthesia: General | Site: Abdomen

## 2022-10-15 MED ORDER — BUPIVACAINE LIPOSOME 1.3 % IJ SUSP
INTRAMUSCULAR | Status: AC
  Filled 2022-10-15: qty 20

## 2022-10-15 MED ORDER — AMISULPRIDE (ANTIEMETIC) 5 MG/2ML IV SOLN: 10.0000 mg | Freq: Once | INTRAVENOUS | Status: DC | PRN

## 2022-10-15 MED ORDER — CHLORHEXIDINE GLUCONATE CLOTH 2 % EX PADS: 6.0000 | MEDICATED_PAD | Freq: Once | CUTANEOUS | Status: DC

## 2022-10-15 MED ORDER — LEVOTHYROXINE SODIUM 100 MCG/5ML IV SOLN
44.0000 ug | Freq: Every day | INTRAVENOUS | Status: DC
  Administered 2022-10-16: 44 ug via INTRAVENOUS
  Filled 2022-10-15: qty 5

## 2022-10-15 MED ORDER — EPHEDRINE 5 MG/ML INJ
INTRAVENOUS | Status: AC
  Filled 2022-10-15: qty 5

## 2022-10-15 MED ORDER — HYDROCODONE-ACETAMINOPHEN 7.5-325 MG/15ML PO SOLN
10.0000 mL | ORAL | Status: DC | PRN
  Administered 2022-10-17: 10 mL via ORAL
  Filled 2022-10-15: qty 15

## 2022-10-15 MED ORDER — DEXTROSE-NACL 5-0.9 % IV SOLN: INTRAVENOUS | Status: DC

## 2022-10-15 MED ORDER — SUGAMMADEX SODIUM 200 MG/2ML IV SOLN
INTRAVENOUS | Status: DC | PRN
  Administered 2022-10-15: 200 mg via INTRAVENOUS

## 2022-10-15 MED ORDER — PHENYLEPHRINE HCL-NACL 20-0.9 MG/250ML-% IV SOLN
INTRAVENOUS | Status: DC | PRN
  Administered 2022-10-15: 50 ug/min via INTRAVENOUS

## 2022-10-15 MED ORDER — VANCOMYCIN HCL IN DEXTROSE 1-5 GM/200ML-% IV SOLN
1000.0000 mg | INTRAVENOUS | Status: AC
  Administered 2022-10-15: 1000 mg via INTRAVENOUS
  Filled 2022-10-15: qty 200

## 2022-10-15 MED ORDER — ROCURONIUM BROMIDE 10 MG/ML (PF) SYRINGE
PREFILLED_SYRINGE | INTRAVENOUS | Status: DC | PRN
  Administered 2022-10-15: 10 mg via INTRAVENOUS
  Administered 2022-10-15: 100 mg via INTRAVENOUS

## 2022-10-15 MED ORDER — ONDANSETRON HCL 4 MG/2ML IJ SOLN
INTRAMUSCULAR | Status: DC | PRN
  Administered 2022-10-15: 4 mg via INTRAVENOUS

## 2022-10-15 MED ORDER — FENTANYL CITRATE (PF) 100 MCG/2ML IJ SOLN
25.0000 ug | INTRAMUSCULAR | Status: DC | PRN
  Administered 2022-10-15: 25 ug via INTRAVENOUS

## 2022-10-15 MED ORDER — HYDROMORPHONE HCL 1 MG/ML IJ SOLN
1.0000 mg | INTRAMUSCULAR | Status: DC | PRN
  Administered 2022-10-15 (×2): 1 mg via INTRAVENOUS
  Filled 2022-10-15 (×2): qty 1

## 2022-10-15 MED ORDER — PROPOFOL 10 MG/ML IV BOLUS
INTRAVENOUS | Status: AC
  Filled 2022-10-15: qty 20

## 2022-10-15 MED ORDER — SCOPOLAMINE 1 MG/3DAYS TD PT72
1.0000 | MEDICATED_PATCH | TRANSDERMAL | Status: DC
  Administered 2022-10-15: 1.5 mg via TRANSDERMAL
  Filled 2022-10-15: qty 1

## 2022-10-15 MED ORDER — LIDOCAINE 2% (20 MG/ML) 5 ML SYRINGE
INTRAMUSCULAR | Status: DC | PRN
  Administered 2022-10-15: 100 mg via INTRAVENOUS

## 2022-10-15 MED ORDER — ACETAMINOPHEN 500 MG PO TABS: 1000.0000 mg | ORAL_TABLET | Freq: Once | ORAL | Status: DC

## 2022-10-15 MED ORDER — PROPOFOL 10 MG/ML IV BOLUS
INTRAVENOUS | Status: DC | PRN
  Administered 2022-10-15: 150 mg via INTRAVENOUS

## 2022-10-15 MED ORDER — DEXAMETHASONE SODIUM PHOSPHATE 10 MG/ML IJ SOLN
INTRAMUSCULAR | Status: DC | PRN
  Administered 2022-10-15: 8 mg via INTRAVENOUS

## 2022-10-15 MED ORDER — SODIUM CHLORIDE (PF) 0.9 % IJ SOLN
INTRAMUSCULAR | Status: DC | PRN
  Administered 2022-10-15: 40 mL

## 2022-10-15 MED ORDER — BUPIVACAINE-EPINEPHRINE 0.25% -1:200000 IJ SOLN: INTRAMUSCULAR | Status: DC | PRN

## 2022-10-15 MED ORDER — ONDANSETRON HCL 4 MG/2ML IJ SOLN
4.0000 mg | Freq: Four times a day (QID) | INTRAMUSCULAR | Status: DC | PRN
  Administered 2022-10-16 – 2022-10-17 (×3): 4 mg via INTRAVENOUS
  Filled 2022-10-15 (×3): qty 2

## 2022-10-15 MED ORDER — MIDAZOLAM HCL 2 MG/2ML IJ SOLN
INTRAMUSCULAR | Status: AC
  Filled 2022-10-15: qty 2

## 2022-10-15 MED ORDER — ONDANSETRON HCL 4 MG/2ML IJ SOLN
INTRAMUSCULAR | Status: AC
  Filled 2022-10-15: qty 4

## 2022-10-15 MED ORDER — FENTANYL CITRATE (PF) 250 MCG/5ML IJ SOLN
INTRAMUSCULAR | Status: AC
  Filled 2022-10-15: qty 5

## 2022-10-15 MED ORDER — CHLORHEXIDINE GLUCONATE 0.12 % MT SOLN
15.0000 mL | Freq: Once | OROMUCOSAL | Status: AC
  Administered 2022-10-15: 15 mL via OROMUCOSAL
  Filled 2022-10-15: qty 15

## 2022-10-15 MED ORDER — 0.9 % SODIUM CHLORIDE (POUR BTL) OPTIME
TOPICAL | Status: DC | PRN
  Administered 2022-10-15: 1000 mL

## 2022-10-15 MED ORDER — SUCCINYLCHOLINE CHLORIDE 200 MG/10ML IV SOSY
PREFILLED_SYRINGE | INTRAVENOUS | Status: DC | PRN
  Administered 2022-10-15: 140 mg via INTRAVENOUS

## 2022-10-15 MED ORDER — ONDANSETRON 4 MG PO TBDP
4.0000 mg | ORAL_TABLET | Freq: Four times a day (QID) | ORAL | Status: DC | PRN
  Administered 2022-10-15: 4 mg via ORAL
  Filled 2022-10-15: qty 1

## 2022-10-15 MED ORDER — EPHEDRINE SULFATE-NACL 50-0.9 MG/10ML-% IV SOSY
PREFILLED_SYRINGE | INTRAVENOUS | Status: DC | PRN
  Administered 2022-10-15: 10 mg via INTRAVENOUS

## 2022-10-15 MED ORDER — LACTATED RINGERS IV SOLN: INTRAVENOUS | Status: DC | PRN

## 2022-10-15 MED ORDER — SUCCINYLCHOLINE CHLORIDE 200 MG/10ML IV SOSY
PREFILLED_SYRINGE | INTRAVENOUS | Status: AC
  Filled 2022-10-15: qty 10

## 2022-10-15 MED ORDER — ENSURE PRE-SURGERY PO LIQD: 296.0000 mL | Freq: Once | ORAL | Status: DC

## 2022-10-15 MED ORDER — FENTANYL CITRATE (PF) 100 MCG/2ML IJ SOLN
INTRAMUSCULAR | Status: AC
  Filled 2022-10-15: qty 2

## 2022-10-15 MED ORDER — ORAL CARE MOUTH RINSE: 15.0000 mL | Freq: Once | OROMUCOSAL | Status: AC

## 2022-10-15 MED ORDER — ROCURONIUM BROMIDE 10 MG/ML (PF) SYRINGE
PREFILLED_SYRINGE | INTRAVENOUS | Status: AC
  Filled 2022-10-15: qty 20

## 2022-10-15 MED ORDER — HYDRALAZINE HCL 20 MG/ML IJ SOLN: 10.0000 mg | INTRAMUSCULAR | Status: DC | PRN

## 2022-10-15 MED ORDER — BUPIVACAINE HCL 0.25 % IJ SOLN
INTRAMUSCULAR | Status: DC | PRN
  Administered 2022-10-15: 18 mL

## 2022-10-15 MED ORDER — ACETAMINOPHEN 500 MG PO TABS
1000.0000 mg | ORAL_TABLET | ORAL | Status: AC
  Administered 2022-10-15: 1000 mg via ORAL
  Filled 2022-10-15: qty 2

## 2022-10-15 MED ORDER — PHENYLEPHRINE 80 MCG/ML (10ML) SYRINGE FOR IV PUSH (FOR BLOOD PRESSURE SUPPORT)
PREFILLED_SYRINGE | INTRAVENOUS | Status: AC
  Filled 2022-10-15: qty 10

## 2022-10-15 MED ORDER — PROMETHAZINE HCL 25 MG/ML IJ SOLN: 6.2500 mg | INTRAMUSCULAR | Status: DC | PRN

## 2022-10-15 MED ORDER — LACTATED RINGERS IV SOLN: INTRAVENOUS | Status: DC

## 2022-10-15 MED ORDER — PROPOFOL 500 MG/50ML IV EMUL
INTRAVENOUS | Status: DC | PRN
  Administered 2022-10-15: 75 ug/kg/min via INTRAVENOUS

## 2022-10-15 MED ORDER — BUPIVACAINE HCL (PF) 0.25 % IJ SOLN
INTRAMUSCULAR | Status: AC
  Filled 2022-10-15: qty 30

## 2022-10-15 MED ORDER — FENTANYL CITRATE (PF) 250 MCG/5ML IJ SOLN
INTRAMUSCULAR | Status: DC | PRN
  Administered 2022-10-15: 50 ug via INTRAVENOUS
  Administered 2022-10-15: 100 ug via INTRAVENOUS
  Administered 2022-10-15: 50 ug via INTRAVENOUS

## 2022-10-15 MED ORDER — DEXAMETHASONE SODIUM PHOSPHATE 10 MG/ML IJ SOLN
INTRAMUSCULAR | Status: AC
  Filled 2022-10-15: qty 2

## 2022-10-15 SURGICAL SUPPLY — 61 items
ADH SKN CLS APL DERMABOND .7 (GAUZE/BANDAGES/DRESSINGS) ×2
APL PRP STRL LF DISP 70% ISPRP (MISCELLANEOUS) ×2
APPLIER CLIP 5 13 M/L LIGAMAX5 (MISCELLANEOUS)
APR CLP MED LRG 5 ANG JAW (MISCELLANEOUS)
CANNULA REDUCER 12-8 DVNC XI (CANNULA) ×3 IMPLANT
CHLORAPREP W/TINT 26 (MISCELLANEOUS) ×3 IMPLANT
CLIP APPLIE 5 13 M/L LIGAMAX5 (MISCELLANEOUS) IMPLANT
COVER MAYO STAND STRL (DRAPES) ×3 IMPLANT
COVER SURGICAL LIGHT HANDLE (MISCELLANEOUS) ×3 IMPLANT
COVER TIP SHEARS 8 DVNC (MISCELLANEOUS) IMPLANT
DEFOGGER SCOPE WARMER CLEARIFY (MISCELLANEOUS) ×3 IMPLANT
DERMABOND ADVANCED .7 DNX12 (GAUZE/BANDAGES/DRESSINGS) ×3 IMPLANT
DEVICE TROCAR PUNCTURE CLOSURE (ENDOMECHANICALS) ×3 IMPLANT
DRAIN PENROSE 0.5X18 (DRAIN) IMPLANT
DRAPE ARM DVNC X/XI (DISPOSABLE) ×12 IMPLANT
DRAPE CARDIOVASC SPLIT 88X140 (DRAPES) ×3 IMPLANT
DRAPE COLUMN DVNC XI (DISPOSABLE) ×3 IMPLANT
DRAPE ORTHO SPLIT 77X108 STRL (DRAPES) ×2
DRAPE SURG ORHT 6 SPLT 77X108 (DRAPES) ×3 IMPLANT
DRIVER NDL MEGA SUTCUT DVNCXI (INSTRUMENTS) ×3 IMPLANT
DRIVER NDLE MEGA SUTCUT DVNCXI (INSTRUMENTS) ×2 IMPLANT
ELECT REM PT RETURN 9FT ADLT (ELECTROSURGICAL) ×2
ELECTRODE REM PT RTRN 9FT ADLT (ELECTROSURGICAL) ×3 IMPLANT
FORCEPS BPLR FENES DVNC XI (FORCEP) ×3 IMPLANT
GLOVE BIO SURGEON STRL SZ7.5 (GLOVE) ×15 IMPLANT
GOWN STRL REUS W/ TWL LRG LVL3 (GOWN DISPOSABLE) ×3 IMPLANT
GOWN STRL REUS W/ TWL XL LVL3 (GOWN DISPOSABLE) ×6 IMPLANT
GOWN STRL REUS W/TWL 2XL LVL3 (GOWN DISPOSABLE) ×3 IMPLANT
GOWN STRL REUS W/TWL LRG LVL3 (GOWN DISPOSABLE) ×4
GOWN STRL REUS W/TWL XL LVL3 (GOWN DISPOSABLE) ×2
IRRIG SUCT STRYKERFLOW 2 WTIP (MISCELLANEOUS) ×2
IRRIGATION SUCT STRKRFLW 2 WTP (MISCELLANEOUS) ×3 IMPLANT
KIT BASIN OR (CUSTOM PROCEDURE TRAY) ×3 IMPLANT
KIT TURNOVER KIT B (KITS) IMPLANT
MARKER SKIN DUAL TIP RULER LAB (MISCELLANEOUS) ×3 IMPLANT
MESH BIO-A 7X10 SYN MAT (Mesh General) ×3 IMPLANT
NDL 22X1.5 STRL (OR ONLY) (MISCELLANEOUS) ×3 IMPLANT
NDL INSUFFLATION 14GA 120MM (NEEDLE) ×3 IMPLANT
NEEDLE 22X1.5 STRL (OR ONLY) (MISCELLANEOUS) ×2 IMPLANT
NEEDLE INSUFFLATION 14GA 120MM (NEEDLE) ×2 IMPLANT
OBTURATOR OPTICAL STND 8 DVNC (TROCAR)
OBTURATOR OPTICALSTD 8 DVNC (TROCAR) IMPLANT
PENCIL SMOKE EVACUATOR (MISCELLANEOUS) IMPLANT
RETRACTOR GRSP SML 8 DVNC XI (INSTRUMENTS) ×3 IMPLANT
SCISSORS LAP 5X35 DISP (ENDOMECHANICALS) IMPLANT
SEAL UNIV 5-12 XI (MISCELLANEOUS) IMPLANT
SEALER VESSEL EXT DVNC XI (MISCELLANEOUS) ×3 IMPLANT
SET TUBE SMOKE EVAC HIGH FLOW (TUBING) ×3 IMPLANT
SPIKE FLUID TRANSFER (MISCELLANEOUS) ×3 IMPLANT
STAPLER VISISTAT 35W (STAPLE) IMPLANT
STOPCOCK 4 WAY LG BORE MALE ST (IV SETS) ×3 IMPLANT
SUT ETHIBOND 0 36 GRN (SUTURE) ×6 IMPLANT
SUT ETHIBOND 2 0 SH (SUTURE)
SUT ETHIBOND 2 0 SH 36X2 (SUTURE) IMPLANT
SUT MNCRL AB 4-0 PS2 18 (SUTURE) ×3 IMPLANT
SUT SILK 0 SH 30 (SUTURE) ×3 IMPLANT
SUT VICRYL 0 UR6 27IN ABS (SUTURE) ×3 IMPLANT
SYR 30ML SLIP (SYRINGE) ×3 IMPLANT
TRAY LAPAROSCOPIC MC (CUSTOM PROCEDURE TRAY) ×3 IMPLANT
TROCAR ADV FIXATION 5X100MM (TROCAR) ×3 IMPLANT
WATER STERILE IRR 1000ML POUR (IV SOLUTION) IMPLANT

## 2022-10-15 NOTE — Op Note (Signed)
10/15/2022  9:07 AM  PATIENT:  Lynn Jenkins  77 y.o. female  PRE-OPERATIVE DIAGNOSIS:  HIATAL HERNIA  POST-OPERATIVE DIAGNOSIS:  HIATAL HERNIA  PROCEDURE:  Procedure(s): XI ROBOTIC ASSISTED HIATAL HERNIA REPAIR WITH TOUPET FUNDOPLICATION (N/A) INSERTION OF MESH  SURGEON:  Surgeon(s) and Role:    Axel Filler, MD - Primary  ASSISTANTS: Jeronimo Greaves, RNFA   ANESTHESIA:   local and general  EBL:  minimal   BLOOD ADMINISTERED:none  DRAINS: none   LOCAL MEDICATIONS USED:  BUPIVICAINE  and OTHER exaprel  SPECIMEN:  No Specimen  DISPOSITION OF SPECIMEN:  N/A  COUNTS:  YES  TOURNIQUET:  * No tourniquets in log *  DICTATION: .Dragon Dictation The patient was taken back to the operating room and placed in the supine position with bilateral SCDs in place. The patient was prepped and draped in the usual sterile fashion. After appropriate antibiotics were confirmed a timeout was called and all facts were verified.   A Veress needle technique was used to insufflate the abdomen to 15 mm of mercury the paramedian stab incision. Subsequent to this an 8 mm trocar was introduced as was a 8 millimeter camera. At this time the subsequent robotic trochars x3, were then placed adjacent to this trocar approximately 8-10 cm away. Each trocar was inserted under direct visualization, there were total of 4 trochars. A 12mm trocar was placed in the midclavicular line.  A 0 vicryl was placed to help with closure at the end of the case.The assistant trocar was then placed in the right lower quadrant under direct visualization. The Nathanson retractor was then visualized inserted into the abdomen and the incision just to the left of the falciform ligament. This was then placed to retract the liver appropriately. At this time the patient was positioned in reverse Trendelenburg.   At this time the robot patient cart was brought to the bedside and placed in good position and the arms were docked to the  trochars appropriately. At this time I proceeded to incised the gastrohepatic ligament.  At this time I proceeded to mobilize the stomach inferiorly and visualize the right crus. The peritoneum over the right crus was incised and right crus was identified. I proceeded to dissect this inferiorly until the left crus was seen joining the right crus. Once the right crus was adequately dissected we turned our to the left crus which was dissected away. This required traction of the stomach to the right side. Once this was visualized we then proceeded to circumferentially dissect the esophagus away from the surrounding tissue. The anterior and posterior vagus was seen along the esophagus at the GE junction.  These were both preserved throughout the entire case. At this time the phrenoesophageal fat pad was dissected away from the esophagus. There was a moderate-sized hiatal hernia seen. I mobilized the esophagus cephalad approximately 4-5 cm, clearing away the surrounding tissue. The anterior hernia sac was dissected away from the stomach and esophagus.  At this time we turned our attention to the greater curvature the stomach and the omentum was mobilized using the robotic vessel sealer. This was taken up to the greater curvature to the hiatus. This mobilized the entire greater curvature to allow mobilization and the wrap. I then proceeded to bring the greater curvature the stomach posterior to the esophagus, and a shoeshine technique was used to evaluate the mobilization of the greater curvature.   At this time I proceeded to close the hiatus using interrupted 0 Ethibonds x 3.  This brought together the hiatal closure without undue stricture to the esophagus.   A piece of Gore Bio A hiatal mesh was placed over the hiatal closure and sutured to the crus using 0 Ethibonds sutures x 4.  At this time the greater curvature was brought around the esophagus and sutured using 0 silk sutures interrupted fashion  approximately 1 cm apart x3 on each side of the esophagus in a Toupet fashion. A left collar stitch was then used to gastropexy the stomach from the wrap to the diaphragm just lateral to the left crus as.  A second collar stitch was placed from the wrap to the right crus.  The wrap lay loose with no strangulation of the esophagus.  At this time the robot was undocked. The liver trocar was removed. At this time insufflation was evacuated. Skin was reapproximated for Monocryl subcuticular fashion. The skin was then dressed with Dermabond. The patient tolerated the procedure well and was taken to the recovery room in stable condition.    PLAN OF CARE: Admit for overnight observation  PATIENT DISPOSITION:  PACU - hemodynamically stable.   Delay start of Pharmacological VTE agent (>24hrs) due to surgical blood loss or risk of bleeding: not applicable

## 2022-10-15 NOTE — Plan of Care (Signed)

## 2022-10-15 NOTE — Anesthesia Procedure Notes (Addendum)
Procedure Name: Intubation Date/Time: 10/15/2022 7:35 AM  Performed by: Darryl Nestle, CRNAPre-anesthesia Checklist: Patient identified, Emergency Drugs available, Suction available, Patient being monitored and Timeout performed Patient Re-evaluated:Patient Re-evaluated prior to induction Oxygen Delivery Method: Circle system utilized and Simple face mask Preoxygenation: Pre-oxygenation with 100% oxygen Induction Type: IV induction, Rapid sequence and Cricoid Pressure applied Laryngoscope Size: Mac and 3 Grade View: Grade I Tube size: 7.0 mm Number of attempts: 1 Airway Equipment and Method: Patient positioned with wedge pillow and Stylet Placement Confirmation: ETT inserted through vocal cords under direct vision Secured at: 21 cm Dental Injury: Teeth and Oropharynx as per pre-operative assessment

## 2022-10-15 NOTE — Transfer of Care (Signed)
Immediate Anesthesia Transfer of Care Note  Patient: Lynn Jenkins  Procedure(s) Performed: XI ROBOTIC ASSISTED HIATAL HERNIA REPAIR WITH FUNDOPLICATION (Abdomen) INSERTION OF MESH (Abdomen)  Patient Location: PACU  Anesthesia Type:General  Level of Consciousness: awake, alert , and oriented  Airway & Oxygen Therapy: Patient Spontanous Breathing  Post-op Assessment: Report given to RN and Post -op Vital signs reviewed and stable  Post vital signs: Reviewed and stable  Last Vitals:  Vitals Value Taken Time  BP 163/87 10/15/22 0930  Temp 98   Pulse 82 10/15/22 0930  Resp 38 10/15/22 0930  SpO2 98 % 10/15/22 0930  Vitals shown include unvalidated device data.  Last Pain:  Vitals:   10/15/22 0617  TempSrc:   PainSc: 0-No pain         Complications: No notable events documented.

## 2022-10-15 NOTE — Interval H&P Note (Signed)
History and Physical Interval Note:  10/15/2022 7:13 AM  Lynn Jenkins  has presented today for surgery, with the diagnosis of HIATAL HERNIA.  The various methods of treatment have been discussed with the patient and family. After consideration of risks, benefits and other options for treatment, the patient has consented to  Procedure(s): XI ROBOTIC ASSISTED HIATAL HERNIA REPAIR WITH FUNDOPLICATION and mesh (N/A) as a surgical intervention.  The patient's history has been reviewed, patient examined, no change in status, stable for surgery.  I have reviewed the patient's chart and labs.  Questions were answered to the patient's satisfaction.     Axel Filler

## 2022-10-15 NOTE — Discharge Instructions (Signed)
EATING AFTER YOUR ESOPHAGEAL SURGERY °(Stomach Fundoplication, Hiatal Hernia repair, Achalasia surgery, etc) ° °###################################################################### ° °EAT °Start with a pureed / full liquid diet (see below) °Gradually transition to a high fiber diet with a fiber supplement over the next month after discharge.   ° °WALK °Walk an hour a day.  Control your pain to do that.   ° °CONTROL PAIN °Control pain so that you can walk, sleep, tolerate sneezing/coughing, go up/down stairs. ° °HAVE A BOWEL MOVEMENT DAILY °Keep your bowels regular to avoid problems.  OK to try a laxative to override constipation.  OK to use an antidairrheal to slow down diarrhea.  Call if not better after 2 tries ° °CALL IF YOU HAVE PROBLEMS/CONCERNS °Call if you are still struggling despite following these instructions. °Call if you have concerns not answered by these instructions ° °###################################################################### ° ° °After your esophageal surgery, expect some sticking with swallowing over the next 1-2 months.   ° °If food sticks when you eat, it is called "dysphagia".  This is due to swelling around your esophagus at the wrap & hiatal diaphragm repair.  It will gradually ease off over the next few months.  To help you through this temporary phase, we start you out on a pureed (blenderized) diet. ° °Your first meal in the hospital was thin liquids.  You should have been given a pureed diet by the time you left the hospital.  We ask patients to stay on a pureed diet for the first 2-3 weeks to avoid anything getting "stuck" near your recent surgery.  Don't be alarmed if your ability to swallow doesn't progress according to this plan.  Everyone is different and some diets can advance more or less quickly.   ° °It is often helpful to crush your medications or split them as they can sometimes stick, especially the first week or so. ° ° °Some BASIC RULES to follow are: °Maintain  an upright position whenever eating or drinking. °Take small bites - just a teaspoon size bite at a time. °Eat slowly.  It may also help to eat only one food at a time. °Consider nibbling through smaller, more frequent meals & avoid the urge to eat BIG meals °Do not push through feelings of fullness, nausea, or bloatedness °Do not mix solid foods and liquids in the same mouthful °Try not to "wash foods down" with large gulps of liquids. °Avoid carbonated (bubbly/fizzy) drinks.   °Avoid foods that make you feel gassy or bloated.  Start with bland foods first.  Wait on trying greasy, fried, or spicy meals until you are tolerating more bland solids well. °Understand that it will be hard to burp and belch at first.  This gradually improves with time.  Expect to be more gassy/flatulent/bloated initially.  Walking will help your body manage it better. °Consider using medications for bloating that contain simethicone such as  Maalox or Gas-X  °Consider crushing her medications, especially smaller pills.  The ability to swallow pills should get easier after a few weeks °Eat in a relaxed atmosphere & minimize distractions. °Avoid talking while eating.   °Do not use straws. °Following each meal, sit in an upright position (90 degree angle) for 60 to 90 minutes.  Going for a short walk can help as well °If food does stick, don't panic.  Try to relax and let the food pass on its own.  Sipping WARM LIQUID such as strong hot black tea can also help slide it down. ° ° °  Be gradual in changes & use common sense: ° °-If you easily tolerating a certain "level" of foods, advance to the next level gradually °-If you are having trouble swallowing a particular food, then avoid it.   °-If food is sticking when you advance your diet, go back to thinner previous diet (the lower LEVEL) for 1-2 days. ° °LEVEL 1 = PUREED DIET ° °Do for the first 2 WEEKS AFTER SURGERY ° °-Foods in this group are pureed or blenderized to a smooth, mashed  potato-like consistency.  °-If necessary, the pureed foods can keep their shape with the addition of a thickening agent.   °-Meat should be pureed to a smooth, pasty consistency.  Hot broth or gravy may be added to the pureed meat, approximately 1 oz. of liquid per 3 oz. serving of meat. °-CAUTION:  If any foods do not puree into a smooth consistency, swallowing will be more difficult.  (For example, nuts or seeds sometimes do not blend well.) ° °Hot Foods Cold Foods  °Pureed scrambled eggs and cheese Pureed cottage cheese  °Baby cereals Thickened juices and nectars  °Thinned cooked cereals (no lumps) Thickened milk or eggnog  °Pureed French toast or pancakes Ensure  °Mashed potatoes Ice cream  °Pureed parsley, au gratin, scalloped potatoes, candied sweet potatoes Fruit or Italian ice, sherbet  °Pureed buttered or alfredo noodles Plain yogurt  °Pureed vegetables (no corn or peas) Instant breakfast  °Pureed soups and creamed soups Smooth pudding, mousse, custard  °Pureed scalloped apples Whipped gelatin  °Gravies Sugar, syrup, honey, jelly  °Sauces, cheese, tomato, barbecue, white, creamed Cream  °Any baby food Creamer  °Alcohol in moderation (not beer or champagne) Margarine  °Coffee or tea Mayonnaise  ° Ketchup, mustard  ° Apple sauce  ° °SAMPLE MENU:  PUREED DIET °Breakfast Lunch Dinner  °Orange juice, 1/2 cup °Cream of wheat, 1/2 cup Pineapple juice, 1/2 cup Pureed turkey, barley soup, 3/4 cup °Pureed Hawaiian chicken, 3 oz  °Scrambled eggs, mashed or blended with cheese, 1/2 cup °Tea or coffee, 1 cup  °Whole milk, 1 cup  °Non-dairy creamer, 2 Tbsp. Mashed potatoes, 1/2 cup °Pureed cooled broccoli, 1/2 cup °Apple sauce, 1/2 cup °Coffee or tea Mashed potatoes, 1/2 cup °Pureed spinach, 1/2 cup °Frozen yogurt, 1/2 cup °Tea or coffee  ° ° ° ° °LEVEL 2 = SOFT DIET ° °After your first 2 weeks, you can advance to a soft diet.   °Keep on this diet until everything goes down easily. ° °Hot Foods Cold Foods  °White fish  Cottage cheese  °Stuffed fish Junior baby fruit  °Baby food meals Semi thickened juices  °Minced soft cooked, scrambled, poached eggs nectars  °Souffle & omelets Ripe mashed bananas  °Cooked cereals Canned fruit, pineapple sauce, milk  °potatoes Milkshake  °Buttered or Alfredo noodles Custard  °Cooked cooled vegetable Puddings, including tapioca  °Sherbet Yogurt  °Vegetable soup or alphabet soup Fruit ice, Italian ice  °Gravies Whipped gelatin  °Sugar, syrup, honey, jelly Junior baby desserts  °Sauces:  Cheese, creamed, barbecue, tomato, white Cream  °Coffee or tea Margarine  ° °SAMPLE MENU:  LEVEL 2 °Breakfast Lunch Dinner  °Orange juice, 1/2 cup °Oatmeal, 1/2 cup °Scrambled eggs with cheese, 1/2 cup °Decaffeinated tea, 1 cup °Whole milk, 1 cup °Non-dairy creamer, 2 Tbsp Pineapple juice, 1/2 cup °Minced beef, 3 oz °Gravy, 2 Tbsp °Mashed potatoes, 1/2 cup °Minced fresh broccoli, 1/2 cup °Applesauce, 1/2 cup °Coffee, 1 cup Turkey, barley soup, 3/4 cup °Minced Hawaiian chicken, 3 oz °  Mashed potatoes, 1/2 cup °Cooked spinach, 1/2 cup °Frozen yogurt, 1/2 cup °Non-dairy creamer, 2 Tbsp  ° ° ° ° °LEVEL 3 = CHOPPED DIET ° °-After all the foods in level 2 (soft diet) are passing through well you should advance up to more chopped foods.  °-It is still important to cut these foods into small pieces and eat slowly. ° °Hot Foods Cold Foods  °Poultry Cottage cheese  °Chopped Swedish meatballs Yogurt  °Meat salads (ground or flaked meat) Milk  °Flaked fish (tuna) Milkshakes  °Poached or scrambled eggs Soft, cold, dry cereal  °Souffles and omelets Fruit juices or nectars  °Cooked cereals Chopped canned fruit  °Chopped French toast or pancakes Canned fruit cocktail  °Noodles or pasta (no rice) Pudding, mousse, custard  °Cooked vegetables (no frozen peas, corn, or mixed vegetables) Green salad  °Canned small sweet peas Ice cream  °Creamed soup or vegetable soup Fruit ice, Italian ice  °Pureed vegetable soup or alphabet soup  Non-dairy creamer  °Ground scalloped apples Margarine  °Gravies Mayonnaise  °Sauces:  Cheese, creamed, barbecue, tomato, white Ketchup  °Coffee or tea Mustard  ° °SAMPLE MENU:  LEVEL 3 °Breakfast Lunch Dinner  °Orange juice, 1/2 cup °Oatmeal, 1/2 cup °Scrambled eggs with cheese, 1/2 cup °Decaffeinated tea, 1 cup °Whole milk, 1 cup °Non-dairy creamer, 2 Tbsp °Ketchup, 1 Tbsp °Margarine, 1 tsp °Salt, 1/4 tsp °Sugar, 2 tsp Pineapple juice, 1/2 cup °Ground beef, 3 oz °Gravy, 2 Tbsp °Mashed potatoes, 1/2 cup °Cooked spinach, 1/2 cup °Applesauce, 1/2 cup °Decaffeinated coffee °Whole milk °Non-dairy creamer, 2 Tbsp °Margarine, 1 tsp °Salt, 1/4 tsp Pureed turkey, barley soup, 3/4 cup °Barbecue chicken, 3 oz °Mashed potatoes, 1/2 cup °Ground fresh broccoli, 1/2 cup °Frozen yogurt, 1/2 cup °Decaffeinated tea, 1 cup °Non-dairy creamer, 2 Tbsp °Margarine, 1 tsp °Salt, 1/4 tsp °Sugar, 1 tsp  ° ° °LEVEL 4:  REGULAR FOODS ° °-Foods in this group are soft, moist, regularly textured foods.   °-This level includes meat and breads, which tend to be the hardest things to swallow.   °-Eat very slowly, chew well and continue to avoid carbonated drinks. °-most people are at this level in 4-6 weeks ° °Hot Foods Cold Foods  °Baked fish or skinned Soft cheeses - cottage cheese  °Souffles and omelets Cream cheese  °Eggs Yogurt  °Stuffed shells Milk  °Spaghetti with meat sauce Milkshakes  °Cooked cereal Cold dry cereals (no nuts, dried fruit, coconut)  °French toast or pancakes Crackers  °Buttered toast Fruit juices or nectars  °Noodles or pasta (no rice) Canned fruit  °Potatoes (all types) Ripe bananas  °Soft, cooked vegetables (no corn, lima, or baked beans) Peeled, ripe, fresh fruit  °Creamed soups or vegetable soup Cakes (no nuts, dried fruit, coconut)  °Canned chicken noodle soup Plain doughnuts  °Gravies Ice cream  °Bacon dressing Pudding, mousse, custard  °Sauces:  Cheese, creamed, barbecue, tomato, white Fruit ice, Italian ice, sherbet   °Decaffeinated tea or coffee Whipped gelatin  °Pork chops Regular gelatin  ° Canned fruited gelatin molds  ° Sugar, syrup, honey, jam, jelly  ° Cream  ° Non-dairy  ° Margarine  ° Oil  ° Mayonnaise  ° Ketchup  ° Mustard  ° °TROUBLESHOOTING IRREGULAR BOWELS  °1) Avoid extremes of bowel movements (no bad constipation/diarrhea)  °2) Miralax 17gm mixed in 8oz. water or juice-daily. May use BID as needed.  °3) Gas-x,Phazyme, etc. as needed for gas & bloating.  °4) Soft,bland diet. No spicy,greasy,fried foods.  °5) Prilosec over-the-counter   as needed  °6) May hold gluten/wheat products from diet to see if symptoms improve.  °7) May try probiotics (Align, Activa, etc) to help calm the bowels down  °7) If symptoms become worse call back immediately. ° ° ° °If you have any questions please call our office at [facility]: [phone]. ° °

## 2022-10-15 NOTE — Anesthesia Postprocedure Evaluation (Signed)
Anesthesia Post Note  Patient: Lynn Jenkins  Procedure(s) Performed: XI ROBOTIC ASSISTED HIATAL HERNIA REPAIR WITH FUNDOPLICATION (Abdomen) INSERTION OF MESH (Abdomen)     Patient location during evaluation: PACU Anesthesia Type: General Level of consciousness: sedated Pain management: pain level controlled Vital Signs Assessment: post-procedure vital signs reviewed and stable Respiratory status: spontaneous breathing and respiratory function stable Cardiovascular status: stable Postop Assessment: no apparent nausea or vomiting Anesthetic complications: no  No notable events documented.  Last Vitals:  Vitals:   10/15/22 1000 10/15/22 1015  BP: (!) 158/98 (!) 164/94  Pulse: 94 98  Resp: 17 18  Temp:  36.6 C  SpO2: 94% 93%    Last Pain:  Vitals:   10/15/22 1015  TempSrc:   PainSc: Asleep                 Etosha Wetherell DANIEL

## 2022-10-16 ENCOUNTER — Encounter (HOSPITAL_COMMUNITY): Payer: Self-pay | Admitting: General Surgery

## 2022-10-16 ENCOUNTER — Observation Stay (HOSPITAL_COMMUNITY): Payer: Medicare Other

## 2022-10-16 DIAGNOSIS — K449 Diaphragmatic hernia without obstruction or gangrene: Secondary | ICD-10-CM | POA: Diagnosis not present

## 2022-10-16 DIAGNOSIS — K224 Dyskinesia of esophagus: Secondary | ICD-10-CM | POA: Diagnosis not present

## 2022-10-16 LAB — BASIC METABOLIC PANEL
Anion gap: 6 (ref 5–15)
BUN: 14 mg/dL (ref 8–23)
CO2: 25 mmol/L (ref 22–32)
Calcium: 10.5 mg/dL — ABNORMAL HIGH (ref 8.9–10.3)
Chloride: 104 mmol/L (ref 98–111)
Creatinine, Ser: 1.1 mg/dL — ABNORMAL HIGH (ref 0.44–1.00)
GFR, Estimated: 52 mL/min — ABNORMAL LOW (ref >60)
Glucose, Bld: 132 mg/dL — ABNORMAL HIGH (ref 70–99)
Potassium: 4.5 mmol/L (ref 3.5–5.1)
Sodium: 135 mmol/L (ref 135–145)

## 2022-10-16 MED ORDER — METOCLOPRAMIDE HCL 5 MG/ML IJ SOLN
10.0000 mg | Freq: Once | INTRAMUSCULAR | Status: AC
  Administered 2022-10-16: 10 mg via INTRAVENOUS
  Filled 2022-10-16: qty 2

## 2022-10-16 MED ORDER — IOHEXOL 300 MG/ML  SOLN: 100.0000 mL | Freq: Once | INTRAMUSCULAR | Status: DC | PRN

## 2022-10-16 MED ORDER — HYDROCODONE-ACETAMINOPHEN 7.5-325 MG/15ML PO SOLN: 10.0000 mL | ORAL | 0 refills | Status: DC | PRN

## 2022-10-16 NOTE — Discharge Summary (Signed)
Physician Discharge Summary  Patient ID: Lynn Jenkins MRN: [id number] DOB/AGE: 09/10/1945 77 y.o.  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 Pt with good pain control.  DG esoph POD 1 with no leak.  Transitioned from clears to pureed.  She was doing well otherwise and deemed stable for  home.  Consults: None  Significant Diagnostic Studies: DG esoph with no leak  Treatments: surgery: as above  Discharge Exam: Blood pressure 132/70, pulse 76, temperature 98.8 F (37.1 C), temperature source Oral, resp. rate 17, height 5\' 3"  (1.6 m), weight 88 kg, SpO2 96 %. General appearance: alert and cooperative GI: soft, non-tender; bowel sounds normal; no masses,  no organomegaly and inc c/d/i  Disposition: Discharge disposition: 01-Home or Self Care       Discharge Instructions     Increase activity slowly   Complete by: As directed       Allergies as of 10/16/2022       Reactions   Doxycycline Itching, Swelling, Other (See Comments)   Facial swelling, itching   Morphine And Related Swelling   Zomig [zolmitriptan] Nausea And Vomiting   Increased heart rate    Augmentin [amoxicillin-pot Clavulanate] Diarrhea   Ceftin [cefuroxime Axetil] Other (See Comments)   Stomach ache   Gabapentin Other (See Comments)   Confusion, off balance   Ferrous Sulfate Diarrhea   Methazolamide Diarrhea   Biaxin [clarithromycin] Rash   Cefdinir Rash   Sumatriptan Other (See Comments)   Can use brand Imitrex        Medication List     TAKE these medications    acetaminophen 500 MG tablet Commonly known as: TYLENOL Take 500-1,000 mg by mouth every 6 (six) hours as needed for mild pain or moderate pain.   esomeprazole 40 MG capsule Commonly  known as: NEXIUM Take 40 mg by mouth 2 (two) times daily before a meal.   furosemide 20 MG tablet Commonly known as: LASIX Take 20 mg by mouth daily as needed for fluid or edema.   HYDROcodone-acetaminophen 7.5-325 mg/15 ml solution Commonly known as: HYCET Take 10 mLs by mouth every 4 (four) hours as needed for moderate pain.   levothyroxine 88 MCG tablet Commonly known as: SYNTHROID Take 88 mcg by mouth daily before breakfast.   loperamide 2 MG tablet Commonly known as: IMODIUM A-D Take 2 mg by mouth 4 (four) times daily as needed for diarrhea or loose stools.   methazolamide 50 MG tablet Commonly known as: NEPTAZANE Take 50 mg by mouth 3 (three) times daily.   metoCLOPramide 10 MG tablet Commonly known as: REGLAN Take 1 tablet (10 mg total) by mouth every 6 (six) hours as needed for nausea or vomiting. What changed:  how much to take when to take this additional instructions   nystatin powder Commonly known as: MYCOSTATIN/NYSTOP Apply 1 Application topically 2 (two) times daily as needed (to affected area, if a rash is present).   ondansetron 4 MG disintegrating tablet Commonly known as: ZOFRAN-ODT 4mg  ODT q4 hours prn nausea/vomit What changed:  how much to take how to take this when to take this reasons to take this   Refresh Plus 0.5 % Soln Generic drug: Carboxymethylcellulose Sod PF Place 1 drop into both eyes 2 (two) times daily.   scopolamine 1 MG/3DAYS  Commonly known as: TRANSDERM-SCOP Place 1 patch (1.5 mg total) onto the skin every 3 (three) days.   sertraline 100 MG tablet Commonly known as: ZOLOFT Take 100 mg by mouth daily.   SUMAtriptan 100 MG tablet Commonly known as: IMITREX Take 100 mg by mouth See admin instructions. Take 100 mg by mouth at onset of headache and may repeat once in 2 hours, if no relief- Max of 200 mg/24 hours   timolol 0.5 % ophthalmic solution Commonly known as: TIMOPTIC Place 1 drop into the right eye 2 (two) times  daily.   traMADol 50 MG tablet Commonly known as: ULTRAM Take 1 tablet (50 mg total) by mouth every 6 (six) hours as needed for severe pain.   Xalatan 0.005 % ophthalmic solution Generic drug: latanoprost Place 1 drop into the right eye at bedtime.        Follow-up Information     Axel Filler, MD. Schedule an appointment as soon as possible for a visit in 2 week(s).   Specialty: General Surgery Why: Post op visit Contact information: [address] [place] [address] [phone]                 Signed: Teryl Osterhoudt 10/16/2022, 10:14 AM

## 2022-10-16 NOTE — Progress Notes (Signed)
Patient ambulated with NT and got up she stated that she was having pain but was able to ambulate a little bit. RN asked her if she was ready to DC she stated that she does not feel well still nauseous and threw up a little bit earlier after the Zofran administration at 1103. RN called MD and left a message waiting for a call back to see how he would like me to proceed. Currently not time for Zofran administration again.

## 2022-10-16 NOTE — Plan of Care (Addendum)
Nutrition Education Note  RD consulted for nutrition education regarding a full liquid diet for 2 weeks status post toupet fundoplication.  Met with pt at bedside. She reports good appetite and intake PTA. She reports typically eating 3 meals per day. For breakfast she has bagel and coffee. For lunch she has a hot dog. For dinner she has soup or sandwich. Denies food allergies or intolerances. Denies any unintentional weight loss and reports she has been fairly weight stable. Pt reports she has already started learning about pureed foods she will discharge home eating for two weeks. She has tolerated clear liquids. She reported having some nausea to RD and requested RD reach out to RN. Discussed with RN.  RD provided "Eating After your Esophageal Surgery" handout prepared by [facility]. Encouraged intake of small, frequent meals. Discussed taking small bites when eating and remaining upright when eating or drinking. Reviewed recommended foods for two weeks after surgery that are pureed or blenderized to a smooth consistency. Provided examples of foods pt may be able to eat from handout. Also reviewed sample menus provided on handout.   Expect good compliance  Body mass index is 34.37 kg/m. Pt meets criteria for obesity class 1 based on current BMI.   Diet order was just upgraded to dysphagia 1 with thin liquids. Pt reports she was previously tolerating clear liquids. Labs and medications reviewed. No further nutrition interventions warranted at this time. If additional nutrition issues arise, please re-consult RD.  Letta Median, MS, RD, LDN, CNSC Pager number available on Amion

## 2022-10-16 NOTE — Progress Notes (Signed)
Pt stated that she does not feel ready to go home, encouraged pt to try and ambulate I would help her and she stated she is nauseous at the moment and will wait a little. MD paged awaiting call back to update him

## 2022-10-16 NOTE — Care Management Obs Status (Signed)
MEDICARE OBSERVATION STATUS NOTIFICATION   Patient Details  Name: Lynn Jenkins MRN: [id number] Date of Birth: 07-03-1945   Medicare Observation Status Notification Given:  Yes    Carley Hammed, LCSW 10/16/2022, 11:29 AM

## 2022-10-17 DIAGNOSIS — K449 Diaphragmatic hernia without obstruction or gangrene: Secondary | ICD-10-CM | POA: Diagnosis not present

## 2022-10-17 NOTE — Progress Notes (Signed)
Pt discharged home in stable condition after going over discharge education with no concerns voiced. Pt educated on importance of blood pressure control at home

## 2022-10-22 DIAGNOSIS — K3184 Gastroparesis: Secondary | ICD-10-CM | POA: Diagnosis not present

## 2022-10-22 DIAGNOSIS — K219 Gastro-esophageal reflux disease without esophagitis: Secondary | ICD-10-CM | POA: Diagnosis not present

## 2022-10-22 DIAGNOSIS — R42 Dizziness and giddiness: Secondary | ICD-10-CM | POA: Diagnosis not present

## 2022-10-22 DIAGNOSIS — M329 Systemic lupus erythematosus, unspecified: Secondary | ICD-10-CM | POA: Diagnosis not present

## 2022-10-24 DIAGNOSIS — M25551 Pain in right hip: Secondary | ICD-10-CM | POA: Diagnosis not present

## 2022-10-24 DIAGNOSIS — M2550 Pain in unspecified joint: Secondary | ICD-10-CM | POA: Diagnosis not present

## 2022-10-24 DIAGNOSIS — M329 Systemic lupus erythematosus, unspecified: Secondary | ICD-10-CM | POA: Diagnosis not present

## 2022-10-24 DIAGNOSIS — E669 Obesity, unspecified: Secondary | ICD-10-CM | POA: Diagnosis not present

## 2022-10-24 DIAGNOSIS — M25512 Pain in left shoulder: Secondary | ICD-10-CM | POA: Diagnosis not present

## 2022-10-24 DIAGNOSIS — M25561 Pain in right knee: Secondary | ICD-10-CM | POA: Diagnosis not present

## 2022-10-24 DIAGNOSIS — M549 Dorsalgia, unspecified: Secondary | ICD-10-CM | POA: Diagnosis not present

## 2022-10-24 DIAGNOSIS — M25511 Pain in right shoulder: Secondary | ICD-10-CM | POA: Diagnosis not present

## 2022-10-24 DIAGNOSIS — M25562 Pain in left knee: Secondary | ICD-10-CM | POA: Diagnosis not present

## 2022-10-24 DIAGNOSIS — M1990 Unspecified osteoarthritis, unspecified site: Secondary | ICD-10-CM | POA: Diagnosis not present

## 2022-11-21 DIAGNOSIS — H26492 Other secondary cataract, left eye: Secondary | ICD-10-CM | POA: Diagnosis not present

## 2022-12-16 ENCOUNTER — Encounter: Payer: Self-pay | Admitting: Hematology

## 2022-12-24 ENCOUNTER — Ambulatory Visit: Payer: Medicare Other | Admitting: Podiatry

## 2023-01-02 DIAGNOSIS — M25561 Pain in right knee: Secondary | ICD-10-CM | POA: Diagnosis not present

## 2023-01-30 DIAGNOSIS — M25562 Pain in left knee: Secondary | ICD-10-CM | POA: Diagnosis not present

## 2023-02-11 DIAGNOSIS — M25562 Pain in left knee: Secondary | ICD-10-CM | POA: Diagnosis not present

## 2023-02-18 DIAGNOSIS — M25562 Pain in left knee: Secondary | ICD-10-CM | POA: Diagnosis not present

## 2023-03-04 DIAGNOSIS — H524 Presbyopia: Secondary | ICD-10-CM | POA: Diagnosis not present

## 2023-03-06 DIAGNOSIS — S83242A Other tear of medial meniscus, current injury, left knee, initial encounter: Secondary | ICD-10-CM | POA: Diagnosis not present

## 2023-03-18 DIAGNOSIS — H401123 Primary open-angle glaucoma, left eye, severe stage: Secondary | ICD-10-CM | POA: Diagnosis not present

## 2023-03-18 DIAGNOSIS — H35352 Cystoid macular degeneration, left eye: Secondary | ICD-10-CM | POA: Diagnosis not present

## 2023-03-18 DIAGNOSIS — H401112 Primary open-angle glaucoma, right eye, moderate stage: Secondary | ICD-10-CM | POA: Diagnosis not present

## 2023-03-18 DIAGNOSIS — Z961 Presence of intraocular lens: Secondary | ICD-10-CM | POA: Diagnosis not present

## 2023-03-19 DIAGNOSIS — S83232A Complex tear of medial meniscus, current injury, left knee, initial encounter: Secondary | ICD-10-CM | POA: Diagnosis not present

## 2023-03-19 DIAGNOSIS — S83282A Other tear of lateral meniscus, current injury, left knee, initial encounter: Secondary | ICD-10-CM | POA: Diagnosis not present

## 2023-03-19 DIAGNOSIS — M6752 Plica syndrome, left knee: Secondary | ICD-10-CM | POA: Diagnosis not present

## 2023-03-19 DIAGNOSIS — G8918 Other acute postprocedural pain: Secondary | ICD-10-CM | POA: Diagnosis not present

## 2023-03-19 DIAGNOSIS — S83242A Other tear of medial meniscus, current injury, left knee, initial encounter: Secondary | ICD-10-CM | POA: Diagnosis not present

## 2023-03-19 DIAGNOSIS — S83262A Peripheral tear of lateral meniscus, current injury, left knee, initial encounter: Secondary | ICD-10-CM | POA: Diagnosis not present

## 2023-03-19 DIAGNOSIS — M94262 Chondromalacia, left knee: Secondary | ICD-10-CM | POA: Diagnosis not present

## 2023-03-19 DIAGNOSIS — M2242 Chondromalacia patellae, left knee: Secondary | ICD-10-CM | POA: Diagnosis not present

## 2023-03-19 DIAGNOSIS — X58XXXA Exposure to other specified factors, initial encounter: Secondary | ICD-10-CM | POA: Diagnosis not present

## 2023-03-19 DIAGNOSIS — Y999 Unspecified external cause status: Secondary | ICD-10-CM | POA: Diagnosis not present

## 2023-03-27 DIAGNOSIS — M25562 Pain in left knee: Secondary | ICD-10-CM | POA: Diagnosis not present

## 2023-03-27 DIAGNOSIS — M79672 Pain in left foot: Secondary | ICD-10-CM | POA: Diagnosis not present

## 2023-04-01 DIAGNOSIS — R531 Weakness: Secondary | ICD-10-CM | POA: Diagnosis not present

## 2023-04-01 DIAGNOSIS — R262 Difficulty in walking, not elsewhere classified: Secondary | ICD-10-CM | POA: Diagnosis not present

## 2023-04-01 DIAGNOSIS — M25662 Stiffness of left knee, not elsewhere classified: Secondary | ICD-10-CM | POA: Diagnosis not present

## 2023-04-03 DIAGNOSIS — M25662 Stiffness of left knee, not elsewhere classified: Secondary | ICD-10-CM | POA: Diagnosis not present

## 2023-04-03 DIAGNOSIS — R262 Difficulty in walking, not elsewhere classified: Secondary | ICD-10-CM | POA: Diagnosis not present

## 2023-04-03 DIAGNOSIS — R531 Weakness: Secondary | ICD-10-CM | POA: Diagnosis not present

## 2023-04-07 ENCOUNTER — Encounter: Payer: Self-pay | Admitting: Hematology

## 2023-04-07 DIAGNOSIS — R262 Difficulty in walking, not elsewhere classified: Secondary | ICD-10-CM | POA: Diagnosis not present

## 2023-04-07 DIAGNOSIS — M25662 Stiffness of left knee, not elsewhere classified: Secondary | ICD-10-CM | POA: Diagnosis not present

## 2023-04-07 DIAGNOSIS — R531 Weakness: Secondary | ICD-10-CM | POA: Diagnosis not present

## 2023-04-08 ENCOUNTER — Ambulatory Visit: Payer: Medicare Other | Admitting: Podiatry

## 2023-04-08 ENCOUNTER — Ambulatory Visit (INDEPENDENT_AMBULATORY_CARE_PROVIDER_SITE_OTHER): Payer: Medicare Other

## 2023-04-08 DIAGNOSIS — M1712 Unilateral primary osteoarthritis, left knee: Secondary | ICD-10-CM | POA: Diagnosis not present

## 2023-04-08 DIAGNOSIS — M1711 Unilateral primary osteoarthritis, right knee: Secondary | ICD-10-CM | POA: Diagnosis not present

## 2023-04-08 DIAGNOSIS — M79674 Pain in right toe(s): Secondary | ICD-10-CM

## 2023-04-08 DIAGNOSIS — M79675 Pain in left toe(s): Secondary | ICD-10-CM | POA: Diagnosis not present

## 2023-04-08 DIAGNOSIS — S92515A Nondisplaced fracture of proximal phalanx of left lesser toe(s), initial encounter for closed fracture: Secondary | ICD-10-CM | POA: Diagnosis not present

## 2023-04-08 DIAGNOSIS — M7742 Metatarsalgia, left foot: Secondary | ICD-10-CM | POA: Diagnosis not present

## 2023-04-08 DIAGNOSIS — B351 Tinea unguium: Secondary | ICD-10-CM

## 2023-04-08 DIAGNOSIS — M170 Bilateral primary osteoarthritis of knee: Secondary | ICD-10-CM | POA: Diagnosis not present

## 2023-04-08 DIAGNOSIS — S9032XA Contusion of left foot, initial encounter: Secondary | ICD-10-CM

## 2023-04-08 NOTE — Progress Notes (Signed)
Subjective: No chief complaint on file.  77 year old female presents Today for concerns of nails becoming thick elongated she cannot trim them herself has also calluses which cause pain.  No swelling redness or any drainage.  She did have a fall out of bed and she injured her toes.  She also saw orthopedics for this but she is having some swelling and pain to her toes.  She fell 3 weeks ago.  Objective: AAO x3, NAD-presents wearing regular shoes DP/PT pulses palpable bilaterally, CRT less than 3 seconds Hyperkeratotic lesion of left foot submetatarsal 1 and 5 below the right fifth toe, medial hallux.  There is no underlying ulceration, drainage or signs of infection. Nails are hypertrophic, dystrophic, brittle, discolored, elongated 10. No surrounding redness or drainage. Tenderness nails 1-5 bilaterally.  Tenderness to the toes most of the third and fourth toe on the left foot there is no edema.  Small scab noted but there is no open lesions otherwise.  No swelling redness or drainage. Digital contractures noted with prominent metatarsal heads. No pain with calf compression, swelling, warmth, erythema  Assessment: Symptomatic onychosis, preulcerative callus; toe fracture left 4th   Plan: -All treatment options discussed with the patient including all alternatives, risks, complications.  -X-rays obtained reviewed of the left foot.  Evidence of prior surgery present.  Radiolucency noted along the fourth proximal phalanx consistent with nondisplaced fracture. -Buddy splinting of the third and fourth toes.  She will have this today. Stiffer sole shoe -Sharply debrided the toenails x 10 any complications or bleeding -Sharply hyperkeratotic lesions x 3 without any complications or bleeding.  Discussed offloading pads, moisturizer daily. -Patient encouraged to call the office with any questions, concerns, change in symptoms.   Lynn Jenkins DPM

## 2023-04-10 DIAGNOSIS — R531 Weakness: Secondary | ICD-10-CM | POA: Diagnosis not present

## 2023-04-10 DIAGNOSIS — R262 Difficulty in walking, not elsewhere classified: Secondary | ICD-10-CM | POA: Diagnosis not present

## 2023-04-10 DIAGNOSIS — M25662 Stiffness of left knee, not elsewhere classified: Secondary | ICD-10-CM | POA: Diagnosis not present

## 2023-04-28 DIAGNOSIS — Z23 Encounter for immunization: Secondary | ICD-10-CM | POA: Diagnosis not present

## 2023-05-21 DIAGNOSIS — J40 Bronchitis, not specified as acute or chronic: Secondary | ICD-10-CM | POA: Diagnosis not present

## 2023-05-28 DIAGNOSIS — S139XXA Sprain of joints and ligaments of unspecified parts of neck, initial encounter: Secondary | ICD-10-CM | POA: Diagnosis not present

## 2023-05-28 DIAGNOSIS — M542 Cervicalgia: Secondary | ICD-10-CM | POA: Diagnosis not present

## 2023-05-28 DIAGNOSIS — M25562 Pain in left knee: Secondary | ICD-10-CM | POA: Diagnosis not present

## 2023-05-28 DIAGNOSIS — M25552 Pain in left hip: Secondary | ICD-10-CM | POA: Diagnosis not present

## 2023-05-28 DIAGNOSIS — S8002XA Contusion of left knee, initial encounter: Secondary | ICD-10-CM | POA: Diagnosis not present

## 2023-05-28 DIAGNOSIS — S7002XA Contusion of left hip, initial encounter: Secondary | ICD-10-CM | POA: Diagnosis not present

## 2023-07-02 DIAGNOSIS — R197 Diarrhea, unspecified: Secondary | ICD-10-CM | POA: Diagnosis not present

## 2023-07-02 DIAGNOSIS — R0609 Other forms of dyspnea: Secondary | ICD-10-CM | POA: Diagnosis not present

## 2023-07-02 DIAGNOSIS — R059 Cough, unspecified: Secondary | ICD-10-CM | POA: Diagnosis not present

## 2023-07-02 DIAGNOSIS — R0789 Other chest pain: Secondary | ICD-10-CM | POA: Diagnosis not present

## 2023-07-10 ENCOUNTER — Encounter: Payer: Self-pay | Admitting: Podiatry

## 2023-07-10 ENCOUNTER — Ambulatory Visit (INDEPENDENT_AMBULATORY_CARE_PROVIDER_SITE_OTHER): Payer: Medicare Other | Admitting: Podiatry

## 2023-07-10 DIAGNOSIS — B351 Tinea unguium: Secondary | ICD-10-CM | POA: Diagnosis not present

## 2023-07-10 DIAGNOSIS — M79675 Pain in left toe(s): Secondary | ICD-10-CM | POA: Diagnosis not present

## 2023-07-10 DIAGNOSIS — M79674 Pain in right toe(s): Secondary | ICD-10-CM | POA: Diagnosis not present

## 2023-07-12 NOTE — Progress Notes (Signed)
Subjective: Chief Complaint  Patient presents with   RFC    RM# RFC patient has no concerns at this time.    78 year old female presents to the office today for concerns of nails becoming thick elongated she cannot trim them herself has also calluses which cause pain.  No swelling redness or any drainage.  She has no other concerns to her feet today.   Objective: AAO x3, NAD-presents wearing regular shoes DP/PT pulses palpable bilaterally, CRT less than 3 seconds Minimal hyperkeratotic lesions of left foot submetatarsal 1 and 5 below the right fifth toe, medial hallux.  There is no underlying ulceration, drainage or signs of infection. Nails are hypertrophic, dystrophic, brittle, discolored, elongated 10. No surrounding redness or drainage. Tenderness nails 1-5 bilaterally.  Digital contractures noted with prominent metatarsal heads.  No significant left fourth toe today. No pain with calf compression, swelling, warmth, erythema  Assessment: Symptomatic onychosis, preulcerative callus  Plan: -All treatment options discussed with the patient including all alternatives, risks, complications.  -Buddy splinting of the third and fourth toes.  She will have this today. Stiffer sole shoe -Sharply debrided the toenails x 10 any complications or bleeding -Sharply hyperkeratotic lesions x 3 without any complications or bleeding as a courtesy as they are quite minimal today..  Discussed offloading pads, moisturizer daily. -Patient encouraged to call the office with any questions, concerns, change in symptoms.   Vivi Barrack DPM

## 2023-07-17 ENCOUNTER — Ambulatory Visit: Payer: Medicare Other

## 2023-07-17 NOTE — Progress Notes (Signed)
 Added MT pads to her insoles

## 2023-07-22 DIAGNOSIS — K219 Gastro-esophageal reflux disease without esophagitis: Secondary | ICD-10-CM | POA: Diagnosis not present

## 2023-07-22 DIAGNOSIS — K921 Melena: Secondary | ICD-10-CM | POA: Diagnosis not present

## 2023-07-22 DIAGNOSIS — K529 Noninfective gastroenteritis and colitis, unspecified: Secondary | ICD-10-CM | POA: Diagnosis not present

## 2023-07-31 DIAGNOSIS — K080 Exfoliation of teeth due to systemic causes: Secondary | ICD-10-CM | POA: Diagnosis not present

## 2023-08-18 DIAGNOSIS — R197 Diarrhea, unspecified: Secondary | ICD-10-CM | POA: Diagnosis not present

## 2023-08-18 DIAGNOSIS — Z8719 Personal history of other diseases of the digestive system: Secondary | ICD-10-CM | POA: Diagnosis not present

## 2023-08-18 DIAGNOSIS — K6389 Other specified diseases of intestine: Secondary | ICD-10-CM | POA: Diagnosis not present

## 2023-08-18 DIAGNOSIS — Z860101 Personal history of adenomatous and serrated colon polyps: Secondary | ICD-10-CM | POA: Diagnosis not present

## 2023-08-18 DIAGNOSIS — K2930 Chronic superficial gastritis without bleeding: Secondary | ICD-10-CM | POA: Diagnosis not present

## 2023-08-18 DIAGNOSIS — K5730 Diverticulosis of large intestine without perforation or abscess without bleeding: Secondary | ICD-10-CM | POA: Diagnosis not present

## 2023-08-18 DIAGNOSIS — R6881 Early satiety: Secondary | ICD-10-CM | POA: Diagnosis not present

## 2023-08-18 DIAGNOSIS — K625 Hemorrhage of anus and rectum: Secondary | ICD-10-CM | POA: Diagnosis not present

## 2023-08-18 DIAGNOSIS — K2970 Gastritis, unspecified, without bleeding: Secondary | ICD-10-CM | POA: Diagnosis not present

## 2023-08-18 DIAGNOSIS — D127 Benign neoplasm of rectosigmoid junction: Secondary | ICD-10-CM | POA: Diagnosis not present

## 2023-08-18 DIAGNOSIS — D124 Benign neoplasm of descending colon: Secondary | ICD-10-CM | POA: Diagnosis not present

## 2023-09-01 ENCOUNTER — Ambulatory Visit: Payer: Medicare Other | Admitting: Podiatry

## 2023-09-01 ENCOUNTER — Encounter: Payer: Self-pay | Admitting: Podiatry

## 2023-09-01 DIAGNOSIS — M7989 Other specified soft tissue disorders: Secondary | ICD-10-CM | POA: Diagnosis not present

## 2023-09-01 DIAGNOSIS — M79675 Pain in left toe(s): Secondary | ICD-10-CM | POA: Diagnosis not present

## 2023-09-01 DIAGNOSIS — M79674 Pain in right toe(s): Secondary | ICD-10-CM

## 2023-09-01 DIAGNOSIS — B351 Tinea unguium: Secondary | ICD-10-CM | POA: Diagnosis not present

## 2023-09-01 MED ORDER — CLINDAMYCIN HCL 300 MG PO CAPS: 300.0000 mg | ORAL_CAPSULE | Freq: Three times a day (TID) | ORAL | 0 refills | Status: DC

## 2023-09-01 MED ORDER — CICLOPIROX 8 % EX SOLN: Freq: Every day | CUTANEOUS | 2 refills | Status: DC

## 2023-09-01 NOTE — Progress Notes (Unsigned)
 Subjective: Chief Complaint  Patient presents with   RFC    RM#11 RFC      78 year old female presents to the office today for concerns of nails becoming thick elongated she cannot trim them herself has also calluses which cause pain.  She also states he has pain to her feet and legs.  She gets swelling to her legs.  Seems also worsened since her back to work.  She works at [employer].  No injuries.   Objective: AAO x3, NAD-presents wearing regular shoes DP/PT pulses palpable bilaterally, CRT less than 3 seconds Bilateral lower extremity pitting edema present. Minimal hyperkeratotic lesions of left foot submetatarsal 1 and 5 below the right fifth toe, medial hallux.  There is no underlying ulceration, drainage or signs of infection. Nails are hypertrophic, dystrophic, brittle, discolored, elongated 10. No surrounding redness or drainage. Tenderness nails 1-5 bilaterally.  Digital contractures noted with prominent metatarsal heads.  No significant left fourth toe today. No pain with calf compression, swelling, warmth, erythema  Assessment: Symptomatic onychosis, preulcerative callus  Plan: -All treatment options discussed with the patient including all alternatives, risks, complications.  -Order venous reflux study.  Encouraged compression, elevation.  Discussed follow-up with PCP for possible Lasix. -Sharply debrided the toenails x 10 any complications or bleeding -Sharply hyperkeratotic lesions x 3 without any complications or bleeding as a courtesy as they are quite minimal today. Discussed offloading pads, moisturizer daily. -Patient encouraged to call the office with any questions, concerns, change in symptoms.   Vivi Barrack DPM

## 2023-09-03 ENCOUNTER — Other Ambulatory Visit: Payer: Self-pay | Admitting: Gastroenterology

## 2023-09-03 DIAGNOSIS — K635 Polyp of colon: Secondary | ICD-10-CM | POA: Insufficient documentation

## 2023-09-03 DIAGNOSIS — R609 Edema, unspecified: Secondary | ICD-10-CM | POA: Diagnosis not present

## 2023-09-03 DIAGNOSIS — I8310 Varicose veins of unspecified lower extremity with inflammation: Secondary | ICD-10-CM | POA: Diagnosis not present

## 2023-09-03 DIAGNOSIS — N183 Chronic kidney disease, stage 3 unspecified: Secondary | ICD-10-CM | POA: Diagnosis not present

## 2023-09-03 DIAGNOSIS — E782 Mixed hyperlipidemia: Secondary | ICD-10-CM | POA: Diagnosis not present

## 2023-09-03 DIAGNOSIS — B379 Candidiasis, unspecified: Secondary | ICD-10-CM | POA: Diagnosis not present

## 2023-09-03 DIAGNOSIS — E039 Hypothyroidism, unspecified: Secondary | ICD-10-CM | POA: Diagnosis not present

## 2023-09-03 DIAGNOSIS — Z6838 Body mass index (BMI) 38.0-38.9, adult: Secondary | ICD-10-CM | POA: Diagnosis not present

## 2023-09-03 DIAGNOSIS — E559 Vitamin D deficiency, unspecified: Secondary | ICD-10-CM | POA: Diagnosis not present

## 2023-09-03 DIAGNOSIS — D509 Iron deficiency anemia, unspecified: Secondary | ICD-10-CM | POA: Diagnosis not present

## 2023-09-13 IMAGING — MR MR KNEE*R* W/O CM
4 of 7 series · 17 of 40 positions shown · non-contrast
Comparison: Right knee radiographs 09/19/2021

CLINICAL DATA: Fall 2 weeks ago.  Right knee pain.

EXAM:
MRI OF THE RIGHT KNEE WITHOUT CONTRAST
TECHNIQUE: Multiplanar, multisequence MR imaging of the knee was performed. No
intravenous contrast was administered.

[Series 3: T2 fat-sat · axial · 4.0mm · 0.29mm/px · z∈[-33,+72]mm · 3 of 25 slices shown]
[im 1/25]
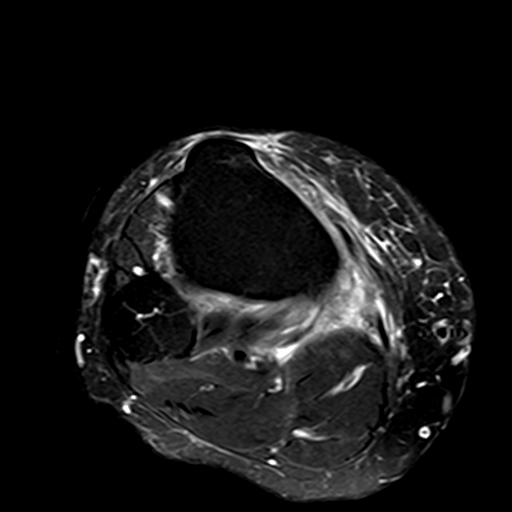
[im 13/25]
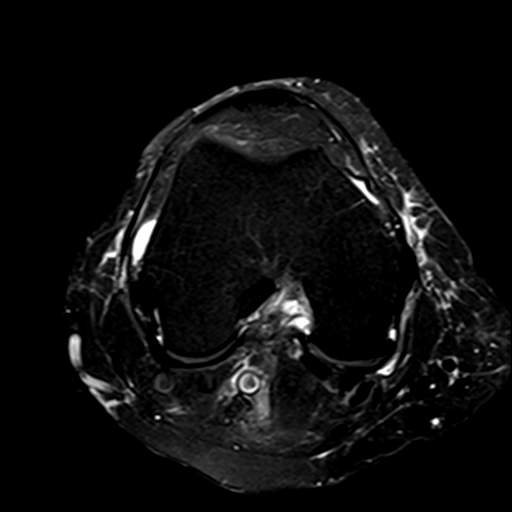
[im 25/25]
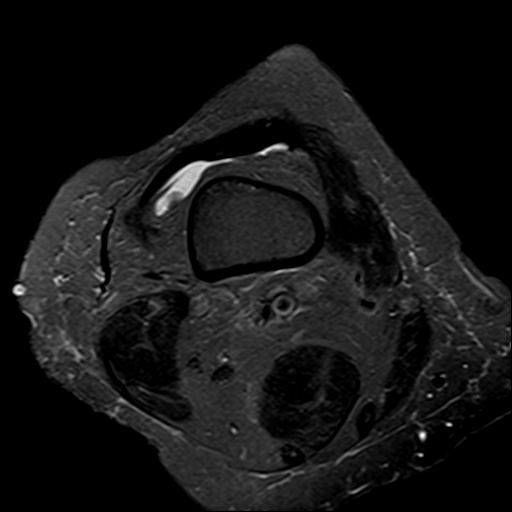

[Series 6: PD fat-sat · coronal · 4.0mm · 0.29mm/px · 6 of 24 slices shown (1 of 3)]
[im 1/24]
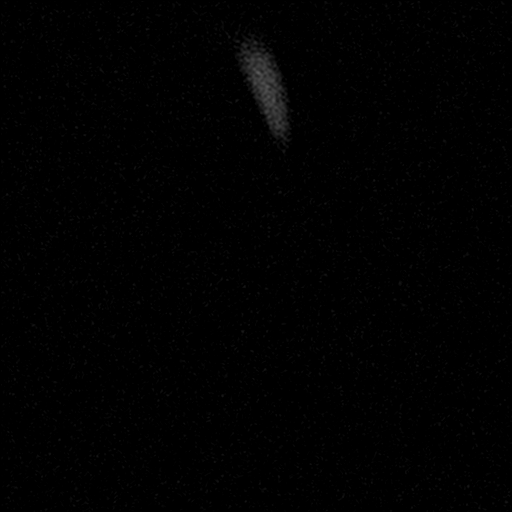
[im 5/24]
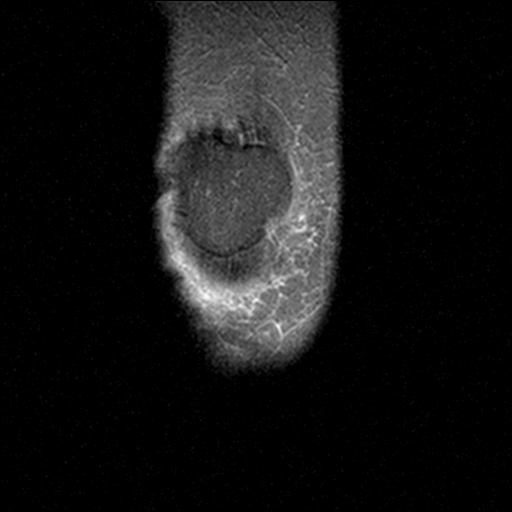
[im 10/24]
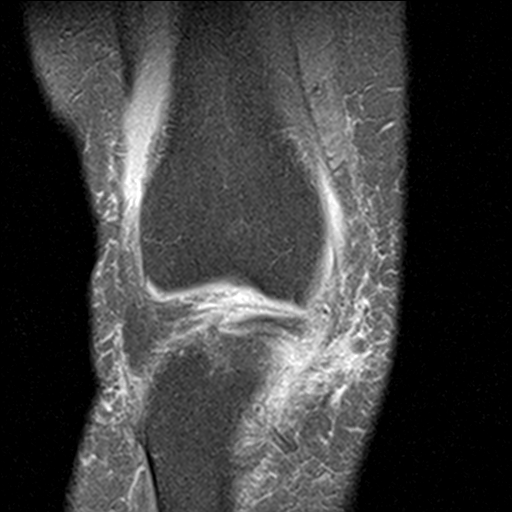
[im 14/24]
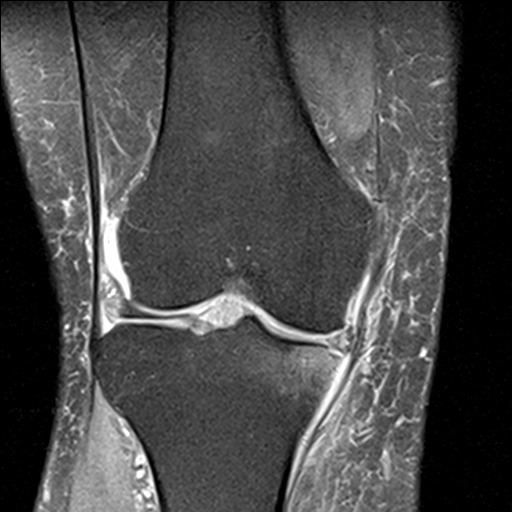
[im 19/24]
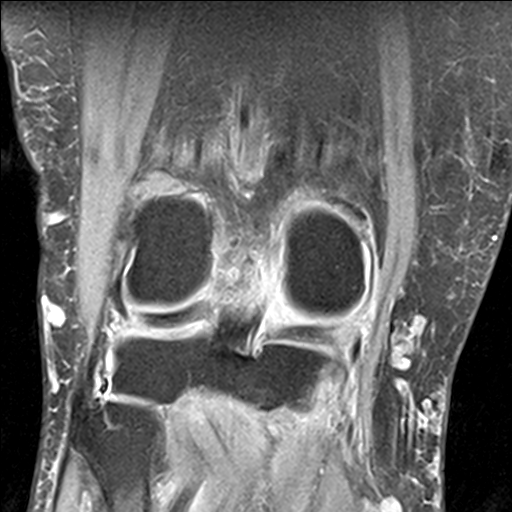
[im 24/24]
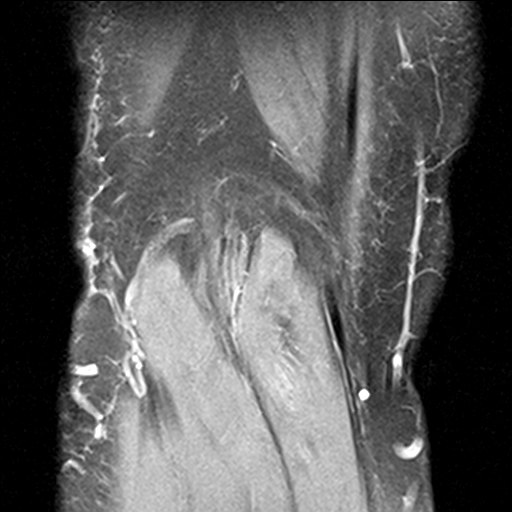

[Series 7: PD fat-sat · sagittal · 3.0mm · 0.29mm/px · 5 of 30 slices shown (2 of 3)]
[im 1/30]
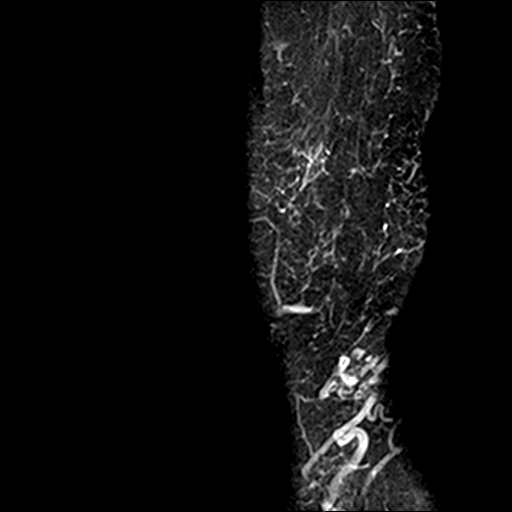
[im 5/30]
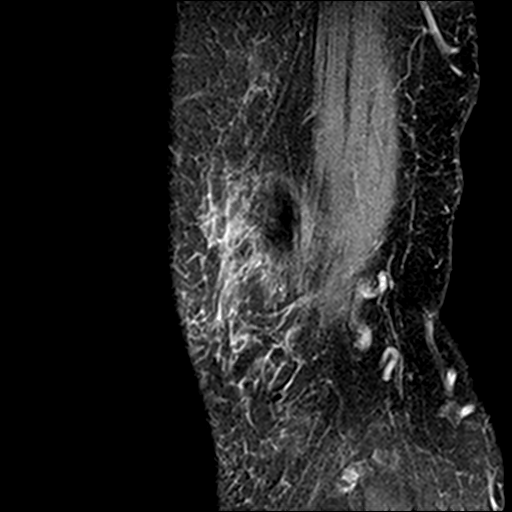
[im 10/30]
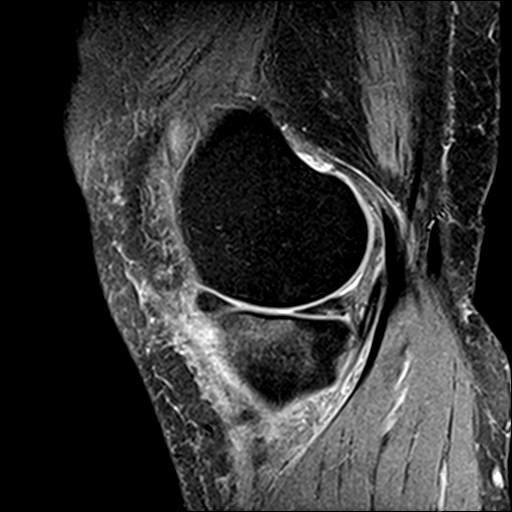
[im 15/30]
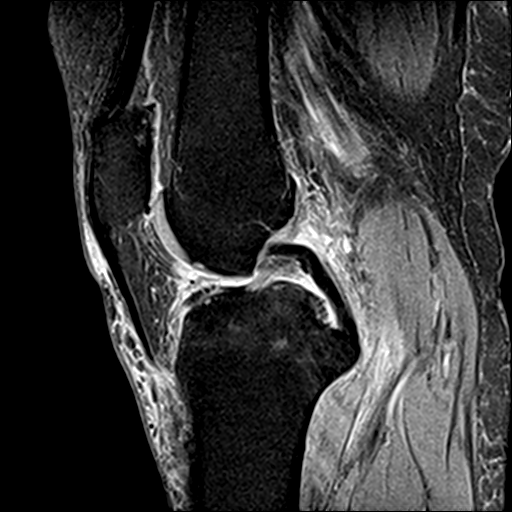
[im 25/30]
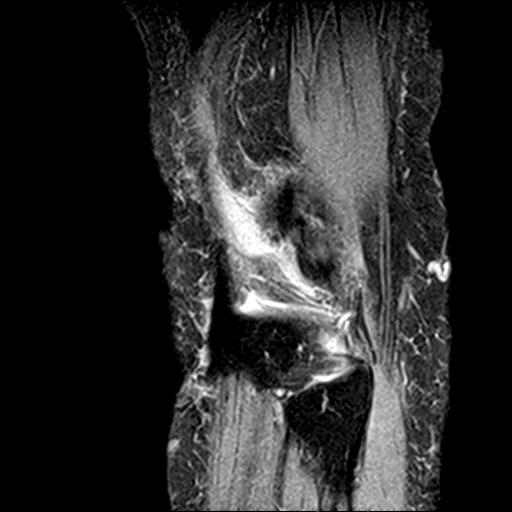

[Series 9: PD fat-sat · oblique · 2.0mm · 0.29mm/px · 3 of 11 slices shown (3 of 3)]
[im 1/11]
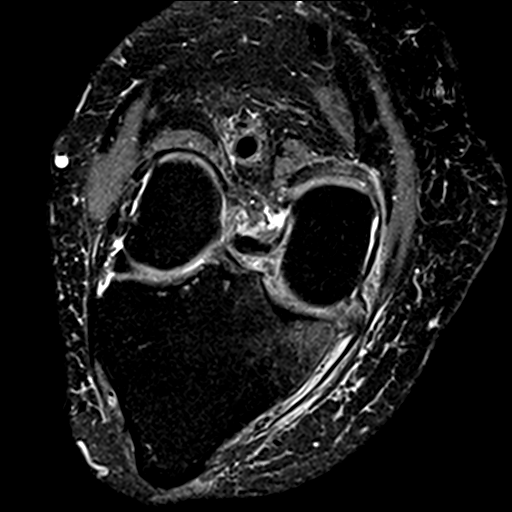
[im 6/11]
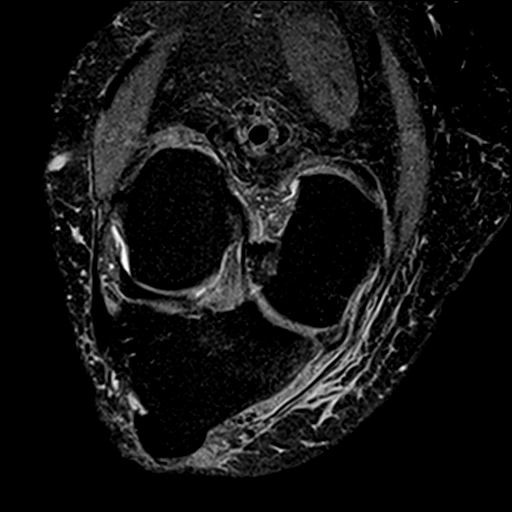
[im 11/11]
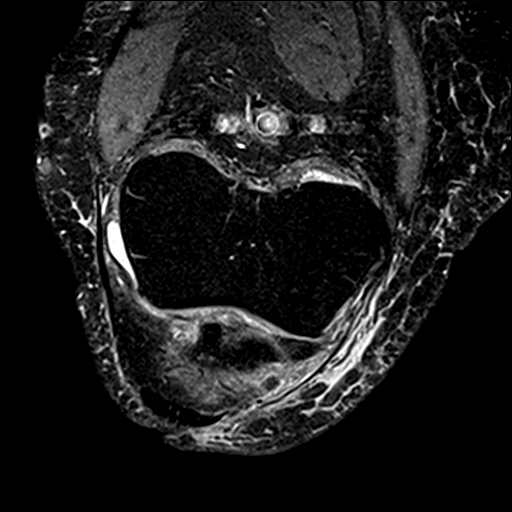

[17 of 40 positions shown; findings below may reference images not displayed]

FINDINGS: MENISCI

Medial meniscus: There is a complex tear of the junction of the body
and posterior horn of the medial meniscus with a horizontal linear
increased signal tear minimally contacting the inferior articular
surface of the middle and peripheral thirds of the meniscal triangle
(sagittal series 7, image 21 and a more broad increased signal
defect involving the central third of the meniscal triangle
(sagittal series 7, image 22). There is moderate extrusion of the
posterior segment of the body of the medial meniscus with mild
extension of meniscal tissue into the medial gutter (coronal series
6, image 8). Diffuse abnormal increased proton density signal
throughout the posterior greater than anterior aspects of the body
of the medial meniscus.

Lateral meniscus: There is diffuse abnormal intermediate proton
density signal within the root of the anterior horn of the lateral
meniscus (sagittal image 12) with the signal extending throughout
the majority of the rest of the anterior horn. No definitive tear is
seen extending through the articular surface of the anterior horn of
the lateral meniscus, however there is mild degenerative fraying of
the superior articular surface of the peripheral third of the
meniscal triangle (sagittal image 10). Mild extrusion of the body of
the lateral meniscus with mild degenerative change of the central
third of the meniscal triangle (coronal series 6, image 9).

LIGAMENTS

Cruciates: The ACL and PCL are intact.

Collaterals: Mild intermediate T2 signal proximal medial collateral
ligament sprain. Mild fluid surrounds the proximal medial collateral
ligament and distal medial collateral ligament. The fibular
collateral ligament, biceps femoris tendon, iliotibial band, and
popliteus tendon are intact.

CARTILAGE

Patellofemoral: There is full-thickness cartilage loss throughout
the superior aspect of the patellar apex in the adjacent medial and
lateral patellar facets. Mild chronic subchondral increased T2
signal. Mild thinning of the superomedial and lateral trochlear
cartilage.

Medial: High-grade partial to full-thickness cartilage loss within
the anteromedial aspect of the medial tibial plateau with moderate
to high-grade subchondral marrow edema. Subtle horizontal linear
decreased T1 and decreased T2 signal deep to the cortex in this
region (sagittal series 8, image 21, coronal series 5, image 11), a
likely tiny acute to subacute subchondral insufficiency fracture. No
overlying cortical collapse. Mild-to-moderate thinning of the medial
aspect of the weight-bearing medial femoral condyle cartilage.

Lateral: Mild thinning of the weight-bearing lateral femoral condyle
and lateral tibial plateau cartilage.

Joint: Tinyjoint effusion. Mild edema within the superolateral
aspect of Hoffa's fat pad as can be seen with infrapatellar fat pad
impingement. The tibial tuberosity-trochlear groove distance
measures 15 mm, at the upper limits of normal.

Popliteal Fossa:  Trace fluid within a Baker's cyst.

Extensor Mechanism:  Intact quadriceps tendon and patellar tendon.

Bones: Anteromedial aspect of the medial tibial plateau subchondral
marrow edema.

Other: None.
IMPRESSION: :
IMPRESSION: 1. Complex tear of the junction of the body and posterior horn of
the medial meniscus with mild extension of meniscal tissue into the
posteromedial gutter.
2. Intrasubstance degeneration within the root of the anterior horn
of the lateral meniscus. Mild degenerative fraying of the free edge
of the mid AP dimension of the body of the lateral meniscus.
3. Mild medial collateral ligament sprain.
4. Mild patellofemoral and mild-to-moderate medial compartment
cartilage degenerative changes.
5. Moderate to high-grade subchondral marrow edema surrounding a
probable tiny partial-thickness insufficiency fracture of the
anteromedial aspect of the medial tibial plateau. No overlying
cortical collapse.

## 2023-09-14 ENCOUNTER — Encounter: Payer: Self-pay | Admitting: Cardiovascular Disease

## 2023-09-14 NOTE — Progress Notes (Signed)
 No show  This encounter was created in error - please disregard.

## 2023-09-15 ENCOUNTER — Ambulatory Visit: Payer: Medicare Other | Attending: Cardiovascular Disease | Admitting: Cardiovascular Disease

## 2023-09-16 ENCOUNTER — Encounter: Payer: Self-pay | Admitting: Cardiovascular Disease

## 2023-09-17 ENCOUNTER — Encounter: Payer: Self-pay | Admitting: Cardiovascular Disease

## 2023-09-17 DIAGNOSIS — H401112 Primary open-angle glaucoma, right eye, moderate stage: Secondary | ICD-10-CM | POA: Diagnosis not present

## 2023-09-17 DIAGNOSIS — H401123 Primary open-angle glaucoma, left eye, severe stage: Secondary | ICD-10-CM | POA: Diagnosis not present

## 2023-09-17 NOTE — Telephone Encounter (Signed)
 Error

## 2023-09-18 ENCOUNTER — Ambulatory Visit (HOSPITAL_COMMUNITY)
Admission: RE | Admit: 2023-09-18 | Discharge: 2023-09-18 | Disposition: A | Discharge status: Home / Self Care | Source: Ambulatory Visit | Attending: Cardiovascular Disease | Admitting: Cardiovascular Disease

## 2023-09-18 DIAGNOSIS — M7989 Other specified soft tissue disorders: Secondary | ICD-10-CM | POA: Insufficient documentation

## 2023-09-22 ENCOUNTER — Encounter: Payer: Self-pay | Admitting: Podiatry

## 2023-09-22 ENCOUNTER — Ambulatory Visit: Admitting: Podiatry

## 2023-09-22 DIAGNOSIS — I872 Venous insufficiency (chronic) (peripheral): Secondary | ICD-10-CM

## 2023-09-22 DIAGNOSIS — L03116 Cellulitis of left lower limb: Secondary | ICD-10-CM | POA: Diagnosis not present

## 2023-09-22 DIAGNOSIS — L03115 Cellulitis of right lower limb: Secondary | ICD-10-CM

## 2023-09-22 MED ORDER — CLINDAMYCIN HCL 300 MG PO CAPS: 300.0000 mg | ORAL_CAPSULE | Freq: Three times a day (TID) | ORAL | 0 refills | Status: DC

## 2023-09-22 NOTE — Progress Notes (Signed)
 Subjective: Chief Complaint  Patient presents with   Foot Swelling    RM#11 Follow up on leg swelling patient states not much better.      78 year old female presents to the office today for follow-up evaluation of swelling.  She states that she is still concerned but redness to her legs.  She has not seen a vein specialist.  She does not report any fevers or chills.  No open lesions.   Objective: AAO x3, NAD-presents wearing regular shoes DP/PT pulses palpable bilaterally, CRT less than 3 seconds Bilateral lower extremity pitting edema present.  Edema still present there is mild erythema and warmth present to bilateral legs which I think is coming more from venous insufficiency.  There is no open lesions identified this time there is no drainage or pus or any fluctuation or crepitation. Digital contractures noted with prominent metatarsal heads.  No significant left fourth toe today. No pain with calf compression, swelling, warmth, erythema  Assessment: Symptomatic onychosis, preulcerative callus  Plan: -All treatment options discussed with the patient including all alternatives, risks, complications.  - Reviewed venous reflux study.  Referral placed for [place] vein specialist.  Encouraged compression, elevation.  Discussed follow-up with PCP for possible Lasix .  Refill clindamycin  today. -Monitor for any clinical signs or symptoms of infection and directed to call the office immediately should any occur or go to the ER.  Return for leg swelling in 2-3 weeks.  Charity Conch DPM

## 2023-09-22 NOTE — Patient Instructions (Signed)
 Monitor for any signs/symptoms of infection. Call the office immediately if any occur or go directly to the emergency room. Call with any questions/concerns.

## 2023-09-23 ENCOUNTER — Telehealth: Payer: Self-pay

## 2023-09-23 NOTE — Telephone Encounter (Signed)
-----   Message from Charity Conch sent at 09/22/2023  4:11 PM EDT ----- Can you fax the referral to [facility] with the ultrasound results?

## 2023-09-23 NOTE — Telephone Encounter (Signed)
 Referral, office note, US  results and demographics faxed to [facility] [phone], fax [phone]

## 2023-09-30 DIAGNOSIS — M545 Low back pain, unspecified: Secondary | ICD-10-CM | POA: Diagnosis not present

## 2023-09-30 DIAGNOSIS — M25561 Pain in right knee: Secondary | ICD-10-CM | POA: Diagnosis not present

## 2023-09-30 DIAGNOSIS — M7918 Myalgia, other site: Secondary | ICD-10-CM | POA: Diagnosis not present

## 2023-09-30 DIAGNOSIS — M25562 Pain in left knee: Secondary | ICD-10-CM | POA: Diagnosis not present

## 2023-10-01 DIAGNOSIS — M79661 Pain in right lower leg: Secondary | ICD-10-CM | POA: Diagnosis not present

## 2023-10-01 DIAGNOSIS — I87393 Chronic venous hypertension (idiopathic) with other complications of bilateral lower extremity: Secondary | ICD-10-CM | POA: Diagnosis not present

## 2023-10-01 DIAGNOSIS — M79662 Pain in left lower leg: Secondary | ICD-10-CM | POA: Diagnosis not present

## 2023-10-01 DIAGNOSIS — M79605 Pain in left leg: Secondary | ICD-10-CM | POA: Diagnosis not present

## 2023-10-01 DIAGNOSIS — M79604 Pain in right leg: Secondary | ICD-10-CM | POA: Diagnosis not present

## 2023-10-07 ENCOUNTER — Ambulatory Visit: Admitting: Podiatry

## 2023-10-13 ENCOUNTER — Ambulatory Visit: Admitting: Podiatry

## 2023-10-13 DIAGNOSIS — M7742 Metatarsalgia, left foot: Secondary | ICD-10-CM | POA: Diagnosis not present

## 2023-10-13 DIAGNOSIS — I872 Venous insufficiency (chronic) (peripheral): Secondary | ICD-10-CM

## 2023-10-13 NOTE — Progress Notes (Unsigned)
 Subjective: Chief Complaint  Patient presents with   Foot Swelling    RM#11 Follow up on leg swelling not any better experiencing leg pain has seen vascular.    78 year old female presents to the office today for follow-up evaluation of swelling.  She has follow-up with [place] vein specialist for the swelling of her legs and she is can be scheduled for procedure.  She does not report any open lesions at this time.  She has no other concerns today.    Objective: AAO x3, NAD-presents wearing regular shoes DP/PT pulses palpable bilaterally, CRT less than 3 seconds Bilateral lower extremity pitting edema present.  Edema is chronic.  There is no erythema or warmth but there is no skin breakdown or any signs of infection today.  There is no pain with calf compression today.  There is no signs of DVT. There is not significant callus formation noted today. No pain with calf compression, swelling, warmth, erythema  Assessment: Venous reflux.   Plan: -All treatment options discussed with the patient including all alternatives, risks, complications.  -As some of the deferred to [place] vein specialist for the swelling to her legs given the venous reflux.  Compression, elevation discussed.  As a courtesy I lightly debrided the nails with any complications or bleeding as they are very minimal.  Continue moisturizer for her skin.  She did bring in inserts for me to modify with metatarsal pads which were added today to help decrease pressure.  Return in about 3 months (around 01/13/2024).  Charity Conch DPM

## 2023-10-29 DIAGNOSIS — I83891 Varicose veins of right lower extremities with other complications: Secondary | ICD-10-CM | POA: Diagnosis not present

## 2023-11-05 DIAGNOSIS — I83891 Varicose veins of right lower extremities with other complications: Secondary | ICD-10-CM | POA: Diagnosis not present

## 2023-11-05 DIAGNOSIS — I83892 Varicose veins of left lower extremities with other complications: Secondary | ICD-10-CM | POA: Diagnosis not present

## 2023-11-05 DIAGNOSIS — Z09 Encounter for follow-up examination after completed treatment for conditions other than malignant neoplasm: Secondary | ICD-10-CM | POA: Diagnosis not present

## 2023-11-12 ENCOUNTER — Encounter (HOSPITAL_COMMUNITY): Payer: Self-pay | Admitting: Gastroenterology

## 2023-11-18 ENCOUNTER — Ambulatory Visit (HOSPITAL_COMMUNITY): Admission: RE | Admit: 2023-11-18 | Source: Home / Self Care | Admitting: Gastroenterology

## 2023-11-18 DIAGNOSIS — K635 Polyp of colon: Secondary | ICD-10-CM | POA: Insufficient documentation

## 2023-11-18 SURGERY — COLONOSCOPY, WITH ARGON PLASMA COAGULATION
Anesthesia: Monitor Anesthesia Care

## 2023-11-22 ENCOUNTER — Emergency Department (HOSPITAL_COMMUNITY)

## 2023-11-22 ENCOUNTER — Emergency Department (HOSPITAL_COMMUNITY)
Admission: EM | Admit: 2023-11-22 | Discharge: 2023-11-22 | Disposition: A | Discharge status: Home / Self Care | Attending: Emergency Medicine | Admitting: Emergency Medicine

## 2023-11-22 ENCOUNTER — Encounter (HOSPITAL_COMMUNITY): Payer: Self-pay | Admitting: Emergency Medicine

## 2023-11-22 DIAGNOSIS — S199XXA Unspecified injury of neck, initial encounter: Secondary | ICD-10-CM | POA: Diagnosis not present

## 2023-11-22 DIAGNOSIS — M542 Cervicalgia: Secondary | ICD-10-CM | POA: Diagnosis not present

## 2023-11-22 DIAGNOSIS — R1013 Epigastric pain: Secondary | ICD-10-CM | POA: Diagnosis not present

## 2023-11-22 DIAGNOSIS — S299XXA Unspecified injury of thorax, initial encounter: Secondary | ICD-10-CM | POA: Insufficient documentation

## 2023-11-22 DIAGNOSIS — M25532 Pain in left wrist: Secondary | ICD-10-CM | POA: Diagnosis not present

## 2023-11-22 DIAGNOSIS — R079 Chest pain, unspecified: Secondary | ICD-10-CM | POA: Diagnosis not present

## 2023-11-22 DIAGNOSIS — S0990XA Unspecified injury of head, initial encounter: Secondary | ICD-10-CM | POA: Insufficient documentation

## 2023-11-22 DIAGNOSIS — K5730 Diverticulosis of large intestine without perforation or abscess without bleeding: Secondary | ICD-10-CM | POA: Diagnosis not present

## 2023-11-22 DIAGNOSIS — K5792 Diverticulitis of intestine, part unspecified, without perforation or abscess without bleeding: Secondary | ICD-10-CM

## 2023-11-22 DIAGNOSIS — Z79899 Other long term (current) drug therapy: Secondary | ICD-10-CM | POA: Diagnosis not present

## 2023-11-22 DIAGNOSIS — W01198A Fall on same level from slipping, tripping and stumbling with subsequent striking against other object, initial encounter: Secondary | ICD-10-CM | POA: Diagnosis not present

## 2023-11-22 DIAGNOSIS — M79642 Pain in left hand: Secondary | ICD-10-CM | POA: Diagnosis not present

## 2023-11-22 DIAGNOSIS — S6992XA Unspecified injury of left wrist, hand and finger(s), initial encounter: Secondary | ICD-10-CM | POA: Diagnosis not present

## 2023-11-22 DIAGNOSIS — I700 Atherosclerosis of aorta: Secondary | ICD-10-CM | POA: Diagnosis not present

## 2023-11-22 DIAGNOSIS — E039 Hypothyroidism, unspecified: Secondary | ICD-10-CM | POA: Insufficient documentation

## 2023-11-22 DIAGNOSIS — S2243XA Multiple fractures of ribs, bilateral, initial encounter for closed fracture: Secondary | ICD-10-CM

## 2023-11-22 DIAGNOSIS — R519 Headache, unspecified: Secondary | ICD-10-CM | POA: Diagnosis not present

## 2023-11-22 DIAGNOSIS — I6523 Occlusion and stenosis of bilateral carotid arteries: Secondary | ICD-10-CM | POA: Diagnosis not present

## 2023-11-22 DIAGNOSIS — W19XXXA Unspecified fall, initial encounter: Secondary | ICD-10-CM

## 2023-11-22 DIAGNOSIS — S2242XA Multiple fractures of ribs, left side, initial encounter for closed fracture: Secondary | ICD-10-CM | POA: Diagnosis not present

## 2023-11-22 LAB — COMPREHENSIVE METABOLIC PANEL WITH GFR
ALT: 13 U/L (ref 0–44)
AST: 18 U/L (ref 15–41)
Albumin: 3.4 g/dL — ABNORMAL LOW (ref 3.5–5.0)
Alkaline Phosphatase: 100 U/L (ref 38–126)
Anion gap: 8 (ref 5–15)
BUN: 23 mg/dL (ref 8–23)
CO2: 23 mmol/L (ref 22–32)
Calcium: 11.8 mg/dL — ABNORMAL HIGH (ref 8.9–10.3)
Chloride: 108 mmol/L (ref 98–111)
Creatinine, Ser: 1.04 mg/dL — ABNORMAL HIGH (ref 0.44–1.00)
GFR, Estimated: 56 mL/min — ABNORMAL LOW (ref >60)
Glucose, Bld: 106 mg/dL — ABNORMAL HIGH (ref 70–99)
Potassium: 4.6 mmol/L (ref 3.5–5.1)
Sodium: 139 mmol/L (ref 135–145)
Total Bilirubin: 0.5 mg/dL (ref 0.0–1.2)
Total Protein: 6.8 g/dL (ref 6.5–8.1)

## 2023-11-22 LAB — CBC WITH DIFFERENTIAL/PLATELET
Abs Immature Granulocytes: 0.05 10*3/uL (ref 0.00–0.07)
Basophils Absolute: 0.1 10*3/uL (ref 0.0–0.1)
Basophils Relative: 1 %
Eosinophils Absolute: 0.4 10*3/uL (ref 0.0–0.5)
Eosinophils Relative: 5 %
HCT: 40.8 % (ref 36.0–46.0)
Hemoglobin: 12.8 g/dL (ref 12.0–15.0)
Immature Granulocytes: 1 %
Lymphocytes Relative: 20 %
Lymphs Abs: 1.7 10*3/uL (ref 0.7–4.0)
MCH: 28.5 pg (ref 26.0–34.0)
MCHC: 31.4 g/dL (ref 30.0–36.0)
MCV: 90.9 fL (ref 80.0–100.0)
Monocytes Absolute: 0.7 10*3/uL (ref 0.1–1.0)
Monocytes Relative: 8 %
Neutro Abs: 5.4 10*3/uL (ref 1.7–7.7)
Neutrophils Relative %: 65 %
Platelets: 167 10*3/uL (ref 150–400)
RBC: 4.49 MIL/uL (ref 3.87–5.11)
RDW: 14.6 % (ref 11.5–15.5)
WBC: 8.2 10*3/uL (ref 4.0–10.5)
nRBC: 0 % (ref 0.0–0.2)

## 2023-11-22 LAB — I-STAT CREATININE, ED: Creatinine, Ser: 1.2 mg/dL — ABNORMAL HIGH (ref 0.44–1.00)

## 2023-11-22 LAB — LIPASE, BLOOD: Lipase: 38 U/L (ref 11–51)

## 2023-11-22 MED ORDER — OXYCODONE HCL 5 MG PO TABS: 5.0000 mg | ORAL_TABLET | ORAL | 0 refills | Status: AC | PRN

## 2023-11-22 MED ORDER — IOHEXOL 300 MG/ML  SOLN
100.0000 mL | Freq: Once | INTRAMUSCULAR | Status: AC | PRN
  Administered 2023-11-22: 100 mL via INTRAVENOUS

## 2023-11-22 NOTE — ED Provider Notes (Signed)
 [place] EMERGENCY DEPARTMENT AT [facility] Provider Note   CSN: [id number] Arrival date & time: 11/22/23  0054     Patient presents with: Lynn Jenkins   Lynn Jenkins is a 78 y.o. female.   The history is provided by the patient.  Fall  She has history of hyperlipidemia, hypothyroidism, hyperparathyroidism, lupus, GERD and comes in following a fall.  She states she tripped and fell forward and did hit her forehead but is mainly complaining of pain in the left anterior rib cage.  She denies loss of consciousness and denies any weakness or numbness or tingling.  She is also complaining of pain in her left hand.  She has been ambulatory since the fall.   Prior to Admission medications   Medication Sig Start Date End Date Taking? Authorizing Provider  acetaminophen  (TYLENOL ) 500 MG tablet Take 500-1,000 mg by mouth every 6 (six) hours as needed for mild pain or moderate pain.    [provider]  ciclopirox  (PENLAC ) 8 % solution Apply topically at bedtime. Apply over nail and surrounding skin. Apply daily over previous coat. After seven (7) days, may remove with alcohol and continue cycle. 09/01/23   Charity Conch, DPM  clindamycin  (CLEOCIN ) 300 MG capsule Take 1 capsule (300 mg total) by mouth 3 (three) times daily. 09/22/23   Charity Conch, DPM  esomeprazole (NEXIUM) 40 MG capsule Take 40 mg by mouth 2 (two) times daily before a meal.     [provider]  furosemide  (LASIX ) 20 MG tablet Take 20 mg by mouth daily as needed for fluid or edema. 08/26/18   [provider]  HYDROcodone -acetaminophen  (HYCET) 7.5-325 mg/15 ml solution Take 10 mLs by mouth every 4 (four) hours as needed for moderate pain. 10/16/22   Shela Derby, MD  levothyroxine  (SYNTHROID ) 88 MCG tablet Take 88 mcg by mouth daily before breakfast. 03/06/22   [provider]  loperamide (IMODIUM A-D) 2 MG tablet Take 2 mg by mouth 4 (four) times daily as needed for diarrhea or  loose stools.    [provider]  methazolamide (NEPTAZANE) 50 MG tablet Take 50 mg by mouth 3 (three) times daily.    [provider]  metoCLOPramide  (REGLAN ) 10 MG tablet Take 1 tablet (10 mg total) by mouth every 6 (six) hours as needed for nausea or vomiting. Patient taking differently: Take 5-10 mg by mouth See admin instructions. Take one tablet by mouth up to 4 times a day 11/14/19   Tonya Fredrickson, MD  nystatin (MYCOSTATIN/NYSTOP) powder Apply 1 Application topically 2 (two) times daily as needed (to affected area, if a rash is present). 07/30/22   [provider]  ondansetron  (ZOFRAN -ODT) 4 MG disintegrating tablet 4mg  ODT q4 hours prn nausea/vomit Patient taking differently: Take 4 mg by mouth every 8 (eight) hours as needed for vomiting or nausea. 4mg  ODT q4 hours prn nausea/vomit 08/14/22   Dalene Duck, MD  REFRESH PLUS 0.5 % SOLN Place 1 drop into both eyes 2 (two) times daily.    [provider]  scopolamine  (TRANSDERM-SCOP) 1 MG/3DAYS Place 1 patch (1.5 mg total) onto the skin every 3 (three) days. 08/17/22   Vada Garibaldi, MD  sertraline  (ZOLOFT ) 100 MG tablet Take 100 mg by mouth daily.    [provider]  SUMAtriptan  (IMITREX ) 100 MG tablet Take 100 mg by mouth See admin instructions. Take 100 mg by mouth at onset of headache and may repeat once in 2 hours, if  no relief- Max of 200 mg/24 hours 08/26/18   [provider]  timolol  (TIMOPTIC ) 0.5 % ophthalmic solution Place 1 drop into the right eye 2 (two) times daily.    [provider]  traMADol  (ULTRAM ) 50 MG tablet Take 1 tablet (50 mg total) by mouth every 6 (six) hours as needed for severe pain. 04/05/22   Charity Conch, DPM  XALATAN  0.005 % ophthalmic solution Place 1 drop into the right eye at bedtime. 03/18/15   [provider]    Allergies: Doxycycline, Morphine and codeine, Zomig [zolmitriptan], Augmentin  [amoxicillin -pot clavulanate], Ceftin  [cefuroxime axetil], Gabapentin, Ferrous sulfate , Methazolamide, Biaxin [clarithromycin], Cefdinir, and Sumatriptan     Review of Systems  All other systems reviewed and are negative.   Updated Vital Signs BP (!) 150/103 (BP Location: Left Arm)   Pulse 70   Temp 98 F (36.7 C)   Resp 18   SpO2 97%   Physical Exam Vitals and nursing note reviewed.   78 year old female, resting comfortably and in no acute distress. Vital signs are significant for elevated blood pressure. Oxygen saturation is 97%, which is normal. Head is normocephalic and atraumatic. PERRLA, EOMI.  Neck is nontender. Back is nontender. Lungs are clear without rales, wheezes, or rhonchi. Chest is mildly tender over the lower sternal area and marked tenderness along the left costal margin near the midline.  There is no crepitus. Heart has regular rate and rhythm without murmur. Abdomen is soft, flat, with mild epigastric tenderness.  There is no rebound or guarding. Pelvis is stable and nontender. Extremities have no cyanosis or edema, full range of motion is present.  There is mild tenderness palpation over the thenar eminence of the left hand with no tenderness to palpation dorsally.  There is no swelling noted. Skin is warm and dry without rash. Neurologic: Mental status is normal, cranial nerves are intact, moves all extremities equally.  (all labs ordered are listed, but only abnormal results are displayed) Labs Reviewed - No data to display  EKG: None  Radiology: No results found.   Procedures   Medications Ordered in the ED - No data to display                                  Medical Decision Making Amount and/or Complexity of Data Reviewed Labs: ordered. Radiology: ordered.  Risk Prescription drug management.   Fall with injury to the anterior chest wall and also left hand.  Report of hitting forehead but no obvious findings on exam.  Based on mechanism of injury, I feel that she will need  CT of head and cervical spine to rule out occult injury.  I have ordered CT of chest/abdomen/pelvis because of tenderness noted in the costal margin.  I have ordered screening labs.  I offered pain medication which patient has declined.  I have reviewed her laboratory test, my interpretation is elevated random glucose level, elevated calcium level which will need to be followed as an outpatient, normal CBC.  Hand x-ray shows no acute injury.  Have independently viewed the images, and agree with the radiologist's interpretation.  CT scans are pending.  Case is signed out to Dr. Drury Geralds.     Final diagnoses:  None    ED Discharge Orders     None          Alissa April, MD 11/22/23 0830

## 2023-11-22 NOTE — Discharge Instructions (Addendum)
 Thank you for coming to [facility] Emergency Department. You were seen for fall with chest pain. We did an exam, labs, and imaging, and these showed both rib fractures on both sides, for total of 5.  Please take 1,000 mg of Tylenol  every 8 hours, and you can use oxycodone  5 mg every 4-6 hours for severe breakthrough pain.  Please be aware that this medication can cause drowsiness and increase your risk for falls.  Please do not drive while taking this medication.  Please take 1-2 capfuls of MiraLAX  per day while taking this medication to avoid constipation.  Please use the incentive spirometer once per hour every hour while awake.  Your imaging also showed possible acute diverticulitis.  This is a small inflammation/infection and an outpouching in the colon.  Please adhere to a clear liquid diet for a few days to allow your bowel to rest.  If you experience any severe abdominal pain, fever/chills, or worsening of your condition please return to the emergency department  Please follow up with your primary care provider within 1 week.   Do not hesitate to return to the ED or call 911 if you experience: -Worsening symptoms -Shortness of breath -Severe abdominal pain - Nausea vomiting so severe that you cannot eat, drink, or take your medications -Lightheadedness, passing out -Fevers/chills -Anything else that concerns you

## 2023-11-22 NOTE — ED Triage Notes (Signed)
 PT was visiting a patient on the floor. Staff reports they found her face down on 4th floor. Pt reports mechanical fall. Has face and head and chest and Left hand pain. Moving all extremities and no bruising noted. No LOC and not on blood thinners.

## 2023-11-22 NOTE — ED Provider Notes (Signed)
 Assumed care of patient from off-going team. For more details, please see note from same day.  In brief, this is a 78 y.o. female who tripped and fell while visiting husband in the hospital. C/o L hand pain, BL lower rib pain.   Plan/Dispo at time of sign-out & ED Course since sign-out: [ ]  CTs, XRs  BP (!) 164/85 (BP Location: Right Arm)   Pulse 62   Temp 98.7 F (37.1 C) (Oral)   Resp 18   SpO2 95%    ED Course:   No TTP at anatomic snuffbox, no hypoxia on RA.   Clinical Course as of 11/22/23 1026  Sat Nov 22, 2023  0857 DG Hand Complete Left 1. No acute osseous abnormality. [HN]  [id number] CT CHEST ABDOMEN PELVIS W CONTRAST 1. Acute nondisplaced fractures of the left fifth, sixth, and seventh ribs and the anterior right fourth and fifth ribs. No pneumothorax or pleural effusion. 2. Dependent atelectasis noted in the lung bases. 3. Left colonic diverticulosis with subtle pericolonic edema around the distal descending colon concerning for acute diverticulitis. No evidence for perforation or abscess. 4. Atrophic left kidney with punctate stone in the upper pole region. 5.  Aortic Atherosclerosis (ICD10-I70.0).   [HN]  0930 CT Head Wo Contrast 1. No acute intracranial abnormality related to the reported polytrauma. [HN]  0930 CT Cervical Spine Wo Contrast 1. No acute abnormality of the cervical spine. 2. Progressive degenerative changes in the cervical spine, particularly at C1-2, C5-6, and C6-7, with ankylosis across the disc space at C4-5, C5-6, and C6-7, and across the posterior elements at C4-5. Grade 1 degenerative anterolisthesis at C3-4, similar to the prior study.   [HN]  1019 Patient reevaluated.  She states she is having bad pain right now.  I informed the patient of her imaging findings including the rib fractures as well as possible acute diverticulitis.  I offered the patient admission to the hospital for pain control of rib fractures but she states she would  rather go home, as her husband is in the hospital and she has 2 dogs at home who have not been fed.  Discussed with the patient pain control with Tylenol  1000 mg every 8 hours as well as oxycodone  5 mg for breakthrough pain.  Discussed not driving while taking this medication and increased risk for drowsiness or falls, patient reports understanding.  Discussed using incentive spirometry every hour while awake and RT will come teach patient how to use it.  Discussed increased risk for pneumonia.  Related to the diverticulitis, patient denies any abdominal pain, nausea vomiting, fever/chills.  She does not have any fever, leukocytosis, or signs of sepsis.  Advised the patient for bowel rest/clear liquid diet for a few days and she reports understanding.  Advise follow-up within 1 week with PCP. [HN]    Clinical Course User Index [HN] Merdis Stalling, MD    Dispo: DC ------------------------------- Annita Kindle, MD Emergency Medicine  This note was created using dictation software, which may contain spelling or grammatical errors.   Merdis Stalling, MD 11/22/23 1026

## 2023-11-22 NOTE — ED Notes (Signed)
 Assisted pt to restroom via wheelchair. Pt has extreme pain when trying to move. EDP made aware.

## 2023-11-28 DIAGNOSIS — S2239XA Fracture of one rib, unspecified side, initial encounter for closed fracture: Secondary | ICD-10-CM | POA: Diagnosis not present

## 2023-11-28 DIAGNOSIS — K5792 Diverticulitis of intestine, part unspecified, without perforation or abscess without bleeding: Secondary | ICD-10-CM | POA: Diagnosis not present

## 2023-12-08 DIAGNOSIS — Z09 Encounter for follow-up examination after completed treatment for conditions other than malignant neoplasm: Secondary | ICD-10-CM | POA: Diagnosis not present

## 2023-12-08 DIAGNOSIS — S2239XA Fracture of one rib, unspecified side, initial encounter for closed fracture: Secondary | ICD-10-CM | POA: Diagnosis not present

## 2023-12-08 DIAGNOSIS — J9811 Atelectasis: Secondary | ICD-10-CM | POA: Diagnosis not present

## 2023-12-08 DIAGNOSIS — M8580 Other specified disorders of bone density and structure, unspecified site: Secondary | ICD-10-CM | POA: Diagnosis not present

## 2023-12-15 DIAGNOSIS — E039 Hypothyroidism, unspecified: Secondary | ICD-10-CM | POA: Diagnosis not present

## 2023-12-15 DIAGNOSIS — Z0000 Encounter for general adult medical examination without abnormal findings: Secondary | ICD-10-CM | POA: Diagnosis not present

## 2023-12-15 DIAGNOSIS — N183 Chronic kidney disease, stage 3 unspecified: Secondary | ICD-10-CM | POA: Diagnosis not present

## 2023-12-15 DIAGNOSIS — E782 Mixed hyperlipidemia: Secondary | ICD-10-CM | POA: Diagnosis not present

## 2023-12-31 ENCOUNTER — Encounter: Payer: Self-pay | Admitting: Cardiology

## 2023-12-31 ENCOUNTER — Other Ambulatory Visit (HOSPITAL_COMMUNITY): Payer: Self-pay

## 2023-12-31 ENCOUNTER — Ambulatory Visit: Attending: Cardiology | Admitting: Cardiology

## 2023-12-31 ENCOUNTER — Encounter: Payer: Self-pay | Admitting: Hematology

## 2023-12-31 VITALS — BP 134/80 | HR 69 | Ht 64.5 in | Wt 215.0 lb

## 2023-12-31 DIAGNOSIS — R0609 Other forms of dyspnea: Secondary | ICD-10-CM | POA: Diagnosis not present

## 2023-12-31 DIAGNOSIS — R0789 Other chest pain: Secondary | ICD-10-CM | POA: Diagnosis not present

## 2023-12-31 DIAGNOSIS — E782 Mixed hyperlipidemia: Secondary | ICD-10-CM | POA: Diagnosis not present

## 2023-12-31 DIAGNOSIS — R519 Headache, unspecified: Secondary | ICD-10-CM | POA: Diagnosis not present

## 2023-12-31 DIAGNOSIS — R400 Somnolence: Secondary | ICD-10-CM | POA: Insufficient documentation

## 2023-12-31 MED ORDER — METOPROLOL SUCCINATE ER 25 MG PO TB24
25.0000 mg | ORAL_TABLET | Freq: Every evening | ORAL | 3 refills | Status: DC
  Filled 2023-12-31: qty 90, 90d supply, fill #0

## 2023-12-31 MED ORDER — ROSUVASTATIN CALCIUM 20 MG PO TABS
20.0000 mg | ORAL_TABLET | Freq: Every day | ORAL | 3 refills | Status: AC
  Filled 2023-12-31: qty 90, 90d supply, fill #0
  Filled 2024-06-29: qty 90, 90d supply, fill #1

## 2023-12-31 MED ORDER — NITROGLYCERIN 0.4 MG SL SUBL
0.4000 mg | SUBLINGUAL_TABLET | SUBLINGUAL | 1 refills | Status: AC | PRN
  Filled 2023-12-31: qty 25, 9d supply, fill #0

## 2023-12-31 NOTE — Patient Instructions (Addendum)
 Medication Instructions:   START Crestor  20 mg once daily  START metoprolol  succinate (toprol  XL) 25 mg daily at night  Nitroglycerin  as needed   *If you need a refill on your cardiac medications before your next appointment, please call your pharmacy*  Lab Work:  Pro BNP   Lipid panel in 2 months   If you have labs (blood work) drawn today and your tests are completely normal, you will receive your results only by: MyChart Message (if you have MyChart) OR A paper copy in the mail If you have any lab test that is abnormal or we need to change your treatment, we will call you to review the results.  Testing/Procedures: Your physician has requested that you have an echocardiogram. Echocardiography is a painless test that uses sound waves to create images of your heart. It provides your doctor with information about the size and shape of your heart and how well your heart's chambers and valves are working. This procedure takes approximately one hour. There are no restrictions for this procedure. Please do NOT wear cologne, perfume, aftershave, or lotions (deodorant is allowed). Please arrive 15 minutes prior to your appointment time.  Please note: We ask at that you not bring children with you during ultrasound (echo/ vascular) testing. Due to room size and safety concerns, children are not allowed in the ultrasound rooms during exams. Our front office staff cannot provide observation of children in our lobby area while testing is being conducted. An adult accompanying a patient to their appointment will only be allowed in the ultrasound room at the discretion of the ultrasound technician under special circumstances. We apologize for any inconvenience.   2. Provider has requested you have a chest CT without contrast  Follow-Up: At [facility], you and your health needs are our priority.  As part of our continuing mission to provide you with exceptional heart care, our providers  are all part of one team.  This team includes your primary Cardiologist (physician) and Advanced Practice Providers or APPs (Physician Assistants and Nurse Practitioners) who all work together to provide you with the care you need, when you need it.  Your next appointment:   3 months  Provider:   Dr. Elmira  Other: How to Prepare for Your Cardiac PET/CT Stress Test:  1. Please do not take these medications before your test:  ~Medications that may interfere with the cardiac pharmacological stress agent (ex. nitrates - including erectile dysfunction medications, isosorbide mononitrate, tamulosin or beta-blockers) the day of the exam. (Erectile dysfunction medication should be held for at least 72 hrs prior to test) ~Theophylline containing medications for 12 hours. ~Dipyridamole 48 hours prior to the test. ~Your remaining medications may be taken with water .  2. Nothing to eat or drink, except water , 3 hours prior to arrival time.   ~ NO caffeine/decaffeinated products, or chocolate 12 hours prior to arrival.  3. NO perfume, cologne or lotion on chest or abdomen area.         - FEMALES - Please avoid wearing dresses to this appointment.  4. Total time is 1 to 2 hours; you may want to bring reading material for the waiting time.  Please report to Radiology at the [facility] Main Entrance 30 minutes early for your test. [address] [place], [place] [address]  Diabetic Preparation: - Hold oral medications. - You may take NPH and Lantus insulin. - Do not take Humalog or Humulin R (Regular Insulin) the day of  your test. - Check blood sugars prior to leaving the house. - If able to eat breakfast prior to 3 hour fasting, you may take all medications, including your insulin, - Do not worry if you miss your breakfast dose of insulin - start at your next meal. - Patients who wear a continuous glucose monitor MUST remove the device prior to scanning.  IF YOU THINK YOU MAY  BE PREGNANT, OR ARE NURSING PLEASE INFORM THE TECHNOLOGIST.  In preparation for your appointment, medication and supplies will be purchased.  Appointment availability is limited, so if you need to cancel or reschedule, please call the Radiology Department at [phone] Geroge Law) OR [phone] [facility])  24 hours in advance to avoid a cancellation fee of $100.00  What to Expect After you Arrive:  Once you arrive and check in for your appointment, you will be taken to a preparation room within the Radiology Department.  A technologist or Nurse will obtain your medical history, verify that you are correctly prepped for the exam, and explain the procedure.  Afterwards,  an IV will be started in your arm and electrodes will be placed on your skin for EKG monitoring during the stress portion of the exam. Then you will be escorted to the PET/CT scanner.  There, staff will get you positioned on the scanner and obtain a blood pressure and EKG.  During the exam, you will continue to be connected to the EKG and blood pressure machines.  A small, safe amount of a radioactive tracer will be injected in your IV to obtain a series of pictures of your heart along with an injection of a stress agent.    After your Exam:  It is recommended that you eat a meal and drink a caffeinated beverage to counter act any effects of the stress agent.  Drink plenty of fluids for the remainder of the day and urinate frequently for the first couple of hours after the exam.  Your doctor will inform you of your test results within 7-10 business days.  For more information and frequently asked questions, please visit our website : [email or web address]  For questions about your test or how to prepare for your test, please call: Cardiac Imaging Nurse Navigators Office: [phone]

## 2023-12-31 NOTE — Progress Notes (Signed)
 Cardiology Office Note:  .   Date:  12/31/2023  ID:  Lynn Jenkins, DOB May 28, 1946, MRN [id number] PCP: Okey Carlin Redbird, MD  [facility] Providers Cardiologist:  Newman Lawrence, MD PCP: Okey Carlin Redbird, MD  Chief Complaint  Patient presents with   Dyspnea on exertion     Lynn Jenkins is a 78 y.o. female with mixed hyperlipidemia, with dyspnea on exertion, chest pain  Discussed the use of AI scribe software for clinical note transcription with the patient, who gave verbal consent to proceed.  History of Present Illness Lynn Jenkins is a 78 year old female who presents with shortness of breath and chest pain.  She has experienced shortness of breath for four to five months, associated with a slower walking pace due to pain. Her blood pressure is consistently low, and she has never been on medication for it.  She experiences sudden chest pain without apparent triggers, lasting seconds to minutes, occurring during activity or at rest, including sleep. Advil provides relief, while Tylenol  does not.  She reports severe headaches at the base of her head and frequent dizziness, requiring support for balance. She has a history of migraines, but the recent headaches are stronger and accompanied by dizziness.  In addition, patient also mention about episodes of falling asleep during daytime, including behind the wheels.  She does not report any episodes of loss of consciousness.  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 She describes herself as very inactive, with limited mobility due to multiple knee surgeries following falls.    Vitals:   12/31/23 1523  BP: 134/80  Pulse: 69  SpO2: 92%      Review of Systems  Cardiovascular:  Positive for chest pain and dyspnea on exertion. Negative for leg swelling, palpitations and syncope.  Neurological:  Positive for headaches.       Daytime sleepiness        Studies Reviewed:  SABRA        EKG 12/31/2023: Normal sinus rhythm Right bundle branch block When compared with ECG of 14-Aug-2022 17:45, No significant change was found    Labs 3-11/2023: Chol 211, TG 236, HDL 44, LDL 125 Hb 12.8 Cr 1.04, 1.2 TSH 3.5  CTA chest 08/2022: CTA of the chest: No evidence of aortic dissection or aneurysmal dilatation.  No evidence of pulmonary emboli.  Dominant right thyroid  nodule stable from recent ultrasound examination. This has been evaluated on previous imaging. (ref: J Am Coll Radiol. 2015 Feb;12(2): 143-50).  CT of the abdomen and pelvis: Large hiatal hernia. Diverticulosis without diverticulitis. No other focal abnormality is noted.   Physical Exam Vitals and nursing note reviewed.  Constitutional:      General: She is not in acute distress. Neck:     Vascular: No JVD.  Cardiovascular:     Rate and Rhythm: Normal rate and regular rhythm.     Heart sounds: Normal heart sounds. No murmur heard. Pulmonary:     Effort: Pulmonary effort is normal.     Breath sounds: Normal breath sounds. No wheezing or rales.  Musculoskeletal:     Right lower leg: Edema (Trace) present.     Left lower leg: Edema (Trace) present.      VISIT DIAGNOSES:   ICD-10-CM   1. Exertional dyspnea  R06.09 EKG 12-Lead    2. Chest tightness  R07.89 EKG 12-Lead    3. DOE (dyspnea on exertion)  R06.09 Pro b natriuretic peptide (BNP)  ECHOCARDIOGRAM COMPLETE    NM PET CT CARDIAC PERFUSION MULTI W/ABSOLUTE BLOODFLOW    4. Nonintractable headache, unspecified chronicity pattern, unspecified headache type  R51.9 CT HEAD WO CONTRAST ( )    5. Mixed hyperlipidemia  E78.2 Lipid panel    Lipid panel    CANCELED: Lipid panel    6. Daytime sleepiness  R40.0 Ambulatory referral to Sleep Studies       Lynn Jenkins is a 78 y.o. female with mixed hyperlipidemia, with dyspnea on exertion, chest pain Assessment & Plan Exertional dyspnea, chest pain: Symptoms are concerning for  cardiac etiology given her risk factors of untreated hyperlipidemia.  Recommend echocardiogram and PET/CT stress test.  Mobility limited due to knee pain issues. Ideally, would like to start her on aspirin 81 mg daily.  However, she is at history of hiatal hernia surgery and intolerance to aspirin.  Therefore, we discussed starting Plavix 75 mg daily.  However, before starting Plavix, I would like to rule out any significant contraindications by doing a CT head without contrast given her increased headaches recently.  I recommend follow-up with PCP to discuss further regarding management of headaches and consider referral to neurology. Recommend starting Crestor  20 mg daily.  Check lipid panel in 2 months. Started metoprolol  succinate 25 mg daily, along with sublingual nitroglycerin  for as needed use, pending further workup.    Leg edema: While cardiac etiology remains in the differential, she has had venous insufficiency in the past which could also be contributing.  Continue Lasix  20 mg daily for now.    Headache: As mentioned above, will obtain CT head before starting Plavix.  Consider further evaluation, including referral to neurology.  Daytime sleepiness: Several episodes of falling asleep during daytime, including by the wheels.  Cautioned patient regarding driving.  Strongly recommend sleep study.  No clear evidence of syncope at this time.      Informed Consent   Shared Decision Making/Informed Consent The risks [chest pain, shortness of breath, cardiac arrhythmias, dizziness, blood pressure fluctuations, myocardial infarction, stroke/transient ischemic attack, nausea, vomiting, allergic reaction, radiation exposure, metallic taste sensation and life-threatening complications (estimated to be 1 in 10,000)], benefits (risk stratification, diagnosing coronary artery disease, treatment guidance) and alternatives of a cardiac PET stress test were discussed in detail with Lynn Jenkins and she  agrees to proceed.       No orders of the defined types were placed in this encounter.    F/u in 3 months  Signed, Newman JINNY Lawrence, MD

## 2024-01-09 DIAGNOSIS — M79672 Pain in left foot: Secondary | ICD-10-CM | POA: Diagnosis not present

## 2024-01-09 DIAGNOSIS — S93602A Unspecified sprain of left foot, initial encounter: Secondary | ICD-10-CM | POA: Diagnosis not present

## 2024-01-13 DIAGNOSIS — S93602A Unspecified sprain of left foot, initial encounter: Secondary | ICD-10-CM | POA: Diagnosis not present

## 2024-01-13 DIAGNOSIS — M170 Bilateral primary osteoarthritis of knee: Secondary | ICD-10-CM | POA: Diagnosis not present

## 2024-01-13 DIAGNOSIS — M1712 Unilateral primary osteoarthritis, left knee: Secondary | ICD-10-CM | POA: Diagnosis not present

## 2024-01-13 DIAGNOSIS — M1711 Unilateral primary osteoarthritis, right knee: Secondary | ICD-10-CM | POA: Diagnosis not present

## 2024-01-14 ENCOUNTER — Other Ambulatory Visit (HOSPITAL_COMMUNITY)

## 2024-01-16 ENCOUNTER — Ambulatory Visit (HOSPITAL_COMMUNITY)

## 2024-01-20 ENCOUNTER — Telehealth: Payer: Self-pay | Admitting: Cardiology

## 2024-02-03 NOTE — Telephone Encounter (Signed)
 TEST. Please ignore.

## 2024-02-06 ENCOUNTER — Ambulatory Visit (HOSPITAL_COMMUNITY)
Admission: RE | Admit: 2024-02-06 | Discharge: 2024-02-06 | Disposition: A | Discharge status: Home / Self Care | Source: Ambulatory Visit | Attending: Cardiology | Admitting: Cardiology

## 2024-02-06 DIAGNOSIS — R079 Chest pain, unspecified: Secondary | ICD-10-CM | POA: Diagnosis not present

## 2024-02-06 DIAGNOSIS — R0609 Other forms of dyspnea: Secondary | ICD-10-CM | POA: Diagnosis not present

## 2024-02-07 ENCOUNTER — Ambulatory Visit: Payer: Self-pay | Admitting: Cardiology

## 2024-02-07 LAB — ECHOCARDIOGRAM COMPLETE
Area-P 1/2: 3.54 cm2
S' Lateral: 2.59 cm

## 2024-02-26 ENCOUNTER — Other Ambulatory Visit: Payer: Self-pay | Admitting: Gastroenterology

## 2024-02-27 DIAGNOSIS — M8580 Other specified disorders of bone density and structure, unspecified site: Secondary | ICD-10-CM | POA: Diagnosis not present

## 2024-02-27 DIAGNOSIS — E042 Nontoxic multinodular goiter: Secondary | ICD-10-CM | POA: Diagnosis not present

## 2024-02-27 DIAGNOSIS — E210 Primary hyperparathyroidism: Secondary | ICD-10-CM | POA: Diagnosis not present

## 2024-03-05 ENCOUNTER — Other Ambulatory Visit (HOSPITAL_COMMUNITY): Payer: Self-pay | Admitting: *Deleted

## 2024-03-05 ENCOUNTER — Encounter (HOSPITAL_COMMUNITY): Payer: Self-pay

## 2024-03-05 DIAGNOSIS — R0609 Other forms of dyspnea: Secondary | ICD-10-CM

## 2024-03-05 DIAGNOSIS — R0789 Other chest pain: Secondary | ICD-10-CM

## 2024-03-09 ENCOUNTER — Ambulatory Visit (HOSPITAL_COMMUNITY): Admission: RE | Admit: 2024-03-09 | Source: Ambulatory Visit

## 2024-03-24 ENCOUNTER — Telehealth (HOSPITAL_BASED_OUTPATIENT_CLINIC_OR_DEPARTMENT_OTHER): Payer: Self-pay

## 2024-03-24 ENCOUNTER — Ambulatory Visit: Payer: Self-pay | Admitting: Surgery

## 2024-03-24 DIAGNOSIS — E210 Primary hyperparathyroidism: Secondary | ICD-10-CM | POA: Diagnosis not present

## 2024-03-24 DIAGNOSIS — Z9009 Acquired absence of other part of head and neck: Secondary | ICD-10-CM | POA: Diagnosis not present

## 2024-03-24 DIAGNOSIS — Z9889 Other specified postprocedural states: Secondary | ICD-10-CM | POA: Diagnosis not present

## 2024-03-24 DIAGNOSIS — Z9089 Acquired absence of other organs: Secondary | ICD-10-CM | POA: Diagnosis not present

## 2024-03-24 NOTE — Telephone Encounter (Signed)
 Pt has IN OFFICE appt 04/07/24 with Dr Elmira at that time preop clearance will be addressed.  Will update the surgeons office.

## 2024-03-24 NOTE — Telephone Encounter (Signed)
   Name: Lynn Jenkins  DOB: 08/13/1945  MRN: [id number]  Primary Cardiologist: Newman JINNY Osualdo Hansell, MD  Chart reviewed as part of pre-operative protocol coverage. The patient has an upcoming visit scheduled with Dr. Leilanni Halvorson on 04/07/2024 at which time clearance can be addressed in case there are any issues that would impact surgical recommendations.  Parathyroidectomy Is not scheduled until TBD as below. I added preop FYI to appointment note so that provider is aware to address at time of outpatient visit.  Per office protocol the cardiology provider should forward their finalized clearance decision and recommendations regarding antiplatelet therapy to the requesting party below.    Patient is not on anticoagulation or antiplatelet per review of current medical record in Epic.    I will route this message as FYI to requesting party and remove this message from the preop box as separate preop APP input not needed at this time.   Please call with any questions.  Lamarr Satterfield, NP  03/24/2024, 4:25 PM

## 2024-03-24 NOTE — Telephone Encounter (Signed)
   Pre-operative Risk Assessment    Patient Name: Lynn Jenkins  DOB: 09-05-1945 MRN: [id number]   Date of last office visit: 12/31/23 with Newman Lawrence, MD Date of next office visit: 04/07/24 with Newman Lawrence, MD   Request for Surgical Clearance    Procedure:  Parathyroidectomy  Date of Surgery:  Clearance TBD                                 Surgeon:  Krystal Spinner, MD Surgeon's Group or Practice Name:  [facility] Phone number:  [phone] Fax number:  [phone]   Type of Clearance Requested:   - Medical    Type of Anesthesia:  General    Additional requests/questions:  Please FAX a note of Cardiac Clearance to [phone], Burnard Crete, LPN. Please call to advise if this patient will require an office visit or further medical work-up before clearance can be given. Please call [phone] (main number) and leave a message with the triage nurse.  SignedPatrcia Hong L   03/24/2024, 4:06 PM

## 2024-03-30 ENCOUNTER — Encounter (HOSPITAL_COMMUNITY): Payer: Self-pay | Admitting: Gastroenterology

## 2024-03-30 NOTE — Progress Notes (Signed)
 Attempted to obtain medical history for pre op call via telephone, unable to reach at this time. HIPAA compliant voicemail message left requesting return call to pre surgical testing department.

## 2024-04-05 NOTE — Anesthesia Preprocedure Evaluation (Signed)
 Anesthesia Evaluation  Patient identified by MRN, date of birth, ID band Patient awake    Reviewed: Allergy & Precautions, NPO status , Patient's Chart, lab work & pertinent test results  History of Anesthesia Complications Negative for: history of anesthetic complications  Airway Mallampati: II  TM Distance: >3 FB Neck ROM: Full    Dental no notable dental hx. (+) Dental Advisory Given   Pulmonary neg pulmonary ROS   Pulmonary exam normal breath sounds clear to auscultation       Cardiovascular + DOE  Normal cardiovascular exam+ Valvular Problems/Murmurs AI and MR  Rhythm:Regular Rate:Normal  Echo 01/2024  1. Prtoximal septal thickening . Left ventricular ejection fraction, by  estimation, is 60 to 65%. The left ventricle has normal function. The left  ventricle has no regional wall motion abnormalities. There is mild left  ventricular hypertrophy. Left ventricular diastolic parameters are  indeterminate. The average left ventricular global longitudinal strain is -20.2 %.   2. Right ventricular systolic function is normal. The right ventricular  size is normal.   3. The mitral valve is normal in structure. Mild mitral valve regurgitation.   4. The aortic valve is tricuspid. Aortic valve regurgitation is mild.  Aortic valve sclerosis/calcification is present, without any evidence of  aortic stenosis.     Neuro/Psych  Headaches PSYCHIATRIC DISORDERS Anxiety        GI/Hepatic Neg liver ROS, hiatal hernia,GERD  Medicated and Controlled,, Cholecystitis    Endo/Other  Hypothyroidism Hyperthyroidism SLE Obesity Primary hyperparathyroidism   Renal/GU negative Renal ROS     Musculoskeletal  (+) Arthritis ,  Fibromyalgia -  Abdominal   Peds  Hematology  (+) Blood dyscrasia, anemia   Anesthesia Other Findings Lupus   Reproductive/Obstetrics                              Anesthesia  Physical Anesthesia Plan  ASA: 3  Anesthesia Plan: MAC   Post-op Pain Management:    Induction: Intravenous  PONV Risk Score and Plan: 4 or greater and Treatment may vary due to age or medical condition, Propofol  infusion and TIVA  Airway Management Planned:   Additional Equipment: None  Intra-op Plan:   Post-operative Plan:   Informed Consent: I have reviewed the patients History and Physical, chart, labs and discussed the procedure including the risks, benefits and alternatives for the proposed anesthesia with the patient or authorized representative who has indicated his/her understanding and acceptance.     Dental advisory given  Plan Discussed with: CRNA  Anesthesia Plan Comments:          Anesthesia Quick Evaluation

## 2024-04-06 ENCOUNTER — Encounter (HOSPITAL_COMMUNITY): Payer: Self-pay | Admitting: Gastroenterology

## 2024-04-06 ENCOUNTER — Other Ambulatory Visit: Payer: Self-pay

## 2024-04-06 ENCOUNTER — Encounter (HOSPITAL_COMMUNITY)
Admission: RE | Disposition: A | Discharge status: Home / Self Care | Payer: Self-pay | Source: Home / Self Care | Attending: Gastroenterology

## 2024-04-06 ENCOUNTER — Encounter (HOSPITAL_COMMUNITY): Payer: Self-pay | Admitting: Anesthesiology

## 2024-04-06 ENCOUNTER — Ambulatory Visit (HOSPITAL_COMMUNITY): Payer: Self-pay | Admitting: Anesthesiology

## 2024-04-06 ENCOUNTER — Ambulatory Visit (HOSPITAL_COMMUNITY)
Admission: RE | Admit: 2024-04-06 | Discharge: 2024-04-06 | Disposition: A | Discharge status: Home / Self Care | Attending: Gastroenterology | Admitting: Gastroenterology

## 2024-04-06 DIAGNOSIS — F419 Anxiety disorder, unspecified: Secondary | ICD-10-CM | POA: Diagnosis not present

## 2024-04-06 DIAGNOSIS — M329 Systemic lupus erythematosus, unspecified: Secondary | ICD-10-CM | POA: Diagnosis not present

## 2024-04-06 DIAGNOSIS — K635 Polyp of colon: Secondary | ICD-10-CM | POA: Diagnosis not present

## 2024-04-06 DIAGNOSIS — Z6834 Body mass index (BMI) 34.0-34.9, adult: Secondary | ICD-10-CM | POA: Insufficient documentation

## 2024-04-06 DIAGNOSIS — E669 Obesity, unspecified: Secondary | ICD-10-CM | POA: Diagnosis not present

## 2024-04-06 DIAGNOSIS — Z9889 Other specified postprocedural states: Secondary | ICD-10-CM | POA: Diagnosis not present

## 2024-04-06 DIAGNOSIS — Z1211 Encounter for screening for malignant neoplasm of colon: Secondary | ICD-10-CM | POA: Insufficient documentation

## 2024-04-06 DIAGNOSIS — M797 Fibromyalgia: Secondary | ICD-10-CM | POA: Diagnosis not present

## 2024-04-06 DIAGNOSIS — Z7722 Contact with and (suspected) exposure to environmental tobacco smoke (acute) (chronic): Secondary | ICD-10-CM | POA: Insufficient documentation

## 2024-04-06 DIAGNOSIS — I080 Rheumatic disorders of both mitral and aortic valves: Secondary | ICD-10-CM | POA: Diagnosis not present

## 2024-04-06 DIAGNOSIS — E039 Hypothyroidism, unspecified: Secondary | ICD-10-CM | POA: Diagnosis not present

## 2024-04-06 DIAGNOSIS — K5730 Diverticulosis of large intestine without perforation or abscess without bleeding: Secondary | ICD-10-CM | POA: Insufficient documentation

## 2024-04-06 DIAGNOSIS — Z860101 Personal history of adenomatous and serrated colon polyps: Secondary | ICD-10-CM | POA: Diagnosis not present

## 2024-04-06 DIAGNOSIS — Z09 Encounter for follow-up examination after completed treatment for conditions other than malignant neoplasm: Secondary | ICD-10-CM | POA: Diagnosis not present

## 2024-04-06 DIAGNOSIS — K640 First degree hemorrhoids: Secondary | ICD-10-CM | POA: Diagnosis not present

## 2024-04-06 DIAGNOSIS — Q438 Other specified congenital malformations of intestine: Secondary | ICD-10-CM | POA: Insufficient documentation

## 2024-04-06 HISTORY — PX: COLONOSCOPY: SHX5424

## 2024-04-06 SURGERY — COLONOSCOPY
Anesthesia: Monitor Anesthesia Care

## 2024-04-06 MED ORDER — PROPOFOL 500 MG/50ML IV EMUL
INTRAVENOUS | Status: DC | PRN
  Administered 2024-04-06: 100 ug/kg/min via INTRAVENOUS
  Administered 2024-04-06: 50 mg via INTRAVENOUS

## 2024-04-06 MED ORDER — SODIUM CHLORIDE 0.9 % IV SOLN
INTRAVENOUS | Status: DC
Start: 2024-04-06 — End: 2024-04-06

## 2024-04-06 MED ORDER — LACTATED RINGERS IV SOLN: INTRAVENOUS | Status: DC | PRN

## 2024-04-06 MED ORDER — EPINEPHRINE 1 MG/10ML IV SOSY
PREFILLED_SYRINGE | INTRAVENOUS | Status: DC | PRN
  Administered 2024-04-06: 6 mg via SUBCUTANEOUS

## 2024-04-06 MED ORDER — LACTATED RINGERS IV SOLN
INTRAVENOUS | Status: AC | PRN
  Administered 2024-04-06: 1000 mL via INTRAVENOUS

## 2024-04-06 MED ORDER — LIDOCAINE 2% (20 MG/ML) 5 ML SYRINGE
INTRAMUSCULAR | Status: DC | PRN
  Administered 2024-04-06: 100 mg via INTRAVENOUS

## 2024-04-06 MED ORDER — GLYCOPYRROLATE 0.2 MG/ML IJ SOLN
INTRAMUSCULAR | Status: DC | PRN
  Administered 2024-04-06: .2 mg via INTRAVENOUS

## 2024-04-06 MED ORDER — PROPOFOL 1000 MG/100ML IV EMUL
INTRAVENOUS | Status: AC
  Filled 2024-04-06: qty 100

## 2024-04-06 MED ORDER — PHENYLEPHRINE 80 MCG/ML (10ML) SYRINGE FOR IV PUSH (FOR BLOOD PRESSURE SUPPORT)
PREFILLED_SYRINGE | INTRAVENOUS | Status: DC | PRN
  Administered 2024-04-06: 80 ug via INTRAVENOUS

## 2024-04-06 NOTE — Anesthesia Procedure Notes (Signed)
 Procedure Name: MAC Date/Time: 04/06/2024 9:43 AM  Performed by: Obadiah Reyes BROCKS, CRNAPre-anesthesia Checklist: Patient identified, Emergency Drugs available, Suction available, Patient being monitored and Timeout performed Patient Re-evaluated:Patient Re-evaluated prior to induction Oxygen Delivery Method: Simple face mask Induction Type: IV induction

## 2024-04-06 NOTE — H&P (Signed)
 Date of Initial H&P: 03/30/24  History reviewed, patient examined, no change in status, stable for surgery.

## 2024-04-06 NOTE — Transfer of Care (Signed)
 Immediate Anesthesia Transfer of Care Note  Patient: Lynn Jenkins  Procedure(s) Performed: COLONOSCOPY  Patient Location: PACU and Endoscopy Unit  Anesthesia Type:MAC  Level of Consciousness: awake, alert , and oriented  Airway & Oxygen Therapy: Patient Spontanous Breathing and Patient connected to nasal cannula oxygen  Post-op Assessment: Report given to RN and Post -op Vital signs reviewed and stable  Post vital signs: Reviewed and stable  Last Vitals:  Vitals Value Taken Time  BP 126/47 04/06/24 10:52  Temp    Pulse 87 04/06/24 10:54  Resp 19 04/06/24 10:54  SpO2 98 % 04/06/24 10:54  Vitals shown include unfiled device data.  Last Pain:  Vitals:   04/06/24 0831  TempSrc: Temporal  PainSc: 0-No pain      Patients Stated Pain Goal: 0 (04/06/24 0831)  Complications: No notable events documented.

## 2024-04-06 NOTE — Discharge Instructions (Signed)

## 2024-04-06 NOTE — Interval H&P Note (Signed)
 History and Physical Interval Note:  04/06/2024 9:44 AM  Lynn Jenkins  has presented today for surgery, with the diagnosis of Colon polyp.  The various methods of treatment have been discussed with the patient and family. After consideration of risks, benefits and other options for treatment, the patient has consented to  Procedure(s): COLONOSCOPY, WITH ARGON PLASMA COAGULATION (N/A) as a surgical intervention.  The patient's history has been reviewed, patient examined, no change in status, stable for surgery.  I have reviewed the patient's chart and labs.  Questions were answered to the patient's satisfaction.     Jerrell JAYSON Sol

## 2024-04-07 ENCOUNTER — Encounter (HOSPITAL_COMMUNITY): Payer: Self-pay | Admitting: Cardiology

## 2024-04-07 ENCOUNTER — Encounter: Payer: Self-pay | Admitting: Cardiology

## 2024-04-07 ENCOUNTER — Ambulatory Visit: Attending: Cardiology | Admitting: Cardiology

## 2024-04-07 VITALS — BP 121/77 | HR 61 | Ht 64.0 in | Wt 206.3 lb

## 2024-04-07 DIAGNOSIS — Z01818 Encounter for other preprocedural examination: Secondary | ICD-10-CM | POA: Diagnosis not present

## 2024-04-07 DIAGNOSIS — E782 Mixed hyperlipidemia: Secondary | ICD-10-CM | POA: Diagnosis not present

## 2024-04-07 NOTE — Patient Instructions (Addendum)
 Medication: STOP Metoprolol    Lab Work: Lipid panel   If you have labs (blood work) drawn today and your tests are completely normal, you will receive your results only by: MyChart Message (if you have MyChart) OR A paper copy in the mail If you have any lab test that is abnormal or we need to change your treatment, we will call you to review the results.   Follow-Up: At [facility], you and your health needs are our priority.  As part of our continuing mission to provide you with exceptional heart care, our providers are all part of one team.  This team includes your primary Cardiologist (physician) and Advanced Practice Providers or APPs (Physician Assistants and Nurse Practitioners) who all work together to provide you with the care you need, when you need it.  Your next appointment:   6 month(s)  Provider:   Newman JINNY Lawrence, MD

## 2024-04-07 NOTE — Addendum Note (Signed)
 Addended by: MANDA BOTTCHER B on: 04/07/2024 06:02 PM   Modules accepted: Orders

## 2024-04-07 NOTE — Progress Notes (Signed)
 Cardiology Office Note:  .   Date:  Apr 16, 2024  ID:  Lynn Jenkins, DOB 10/02/1945, MRN [id number] PCP: Lynn Carlin Redbird, MD  [facility] Providers Cardiologist:  Newman Lawrence, MD PCP: Lynn Carlin Redbird, MD  Chief Complaint  Patient presents with   Pre-op Exam     Lynn Jenkins is a 78 y.o. female with mixed hyperlipidemia  History of Present Illness  Lynn Jenkins saw the patient in 12/2023.  At that time, I recommended echocardiogram and stress test due to some symptoms of chest pain and shortness of breath.  She underwent echocardiogram, details below.  However, she did not undergo stress test as unfortunately her husband passed.  She has also lost her job at [employer] since then.  However, her physical activity has increased since then. In spite of knee pain, she is able to walk several minutes without any complaints of chest pain or shortness of breath.  She has had prior thyroid  surgery and is in need of repeat surgery, to be performed by Dr. Cyrilla with [facility] surgery.      Vitals:   April 16, 2024 1600  BP: 121/77  Pulse: 61  SpO2: 93%       Review of Systems  Cardiovascular:  Negative for chest pain, dyspnea on exertion, leg swelling, palpitations and syncope.  Neurological:  Positive for headaches.       Daytime sleepiness        Studies Reviewed: SABRA          EKG 04/16/2024: Normal sinus rhythm Right bundle branch block When compared with ECG of 31-Dec-2023 15:27, Nonspecific T wave abnormality no longer evident in Anterior leads     Echocardiogram 01/2024:  1. Prtoximal septal thickening . Left ventricular ejection fraction, by  estimation, is 60 to 65%. The left ventricle has normal function. The left  ventricle has no regional wall motion abnormalities. There is mild left  ventricular hypertrophy. Left  ventricular diastolic parameters are indeterminate. The average left  ventricular global longitudinal strain is -20.2 %.   2. Right  ventricular systolic function is normal. The right ventricular  size is normal.   3. The mitral valve is normal in structure. Mild mitral valve  regurgitation.   4. The aortic valve is tricuspid. Aortic valve regurgitation is mild.  Aortic valve sclerosis/calcification is present, without any evidence of  aortic stenosis.   Labs 3-11/2023: Chol 211, TG 236, HDL 44, LDL 125 Hb 12.8 Cr 1.04, 1.2 TSH 3.5  CTA chest 08/2022: CTA of the chest: No evidence of aortic dissection or aneurysmal dilatation.  No evidence of pulmonary emboli.  Dominant right thyroid  nodule stable from recent ultrasound examination. This has been evaluated on previous imaging. (ref: J Am Coll Radiol. 2015 Feb;12(2): 143-50).  CT of the abdomen and pelvis: Large hiatal hernia. Diverticulosis without diverticulitis. No other focal abnormality is noted.   Physical Exam Vitals and nursing note reviewed.  Constitutional:      General: She is not in acute distress. Neck:     Vascular: No JVD.  Cardiovascular:     Rate and Rhythm: Normal rate and regular rhythm.     Heart sounds: Normal heart sounds. No murmur heard. Pulmonary:     Effort: Pulmonary effort is normal.     Breath sounds: Normal breath sounds. No wheezing or rales.  Musculoskeletal:     Right lower leg: Edema (Trace) present.     Left lower leg: Edema (Trace) present.      VISIT DIAGNOSES:  ICD-10-CM   1. Mixed hyperlipidemia  E78.2     2. Pre-op evaluation  Z01.818        Lynn Jenkins is a 78 y.o. female with mixed hyperlipidemia Assessment & Plan Preop evaluation: Basal septal thickening, but otherwise reassuring echocardiogram.  No clearance symptoms of chest pain or shortness of breath at this time.  Reasonable to proceed with upcoming thyroid  surgery with low cardiac risk. Blood pressure is controlled without metoprolol , okay to hold. She is currently on no antiplatelet therapy.  No absolute indication.  Mixed  hyperlipidemia: Continue Crestor  20 mg daily.  Check lipid panel.    F/u in 6 months  Signed, Newman Lynn Lawrence, MD

## 2024-04-07 NOTE — Op Note (Signed)
 [facility] Patient Name: Lynn Jenkins Procedure Date: 04/06/2024 MRN: [id number] Attending MD: Jerrell JAYSON Sol , MD, [id number] Date of Birth: 04-Dec-1945 CSN: [id number] Age: 78 Admit Type: Outpatient Procedure:                Colonoscopy Indications:              High risk colon cancer surveillance: Personal                            history of adenoma (10 mm or greater in size), High                            risk colon cancer surveillance: Personal history of                            adenoma with high grade dysplasia, High risk colon                            cancer surveillance: Personal history of adenoma                            with villous component, Last colonoscopy: March 2025 Providers:                Jerrell KYM Sol, MD, Hoy Penner, RN,                            Coye Bade, Technician Referring MD:             Carlin Gull Medicines:                Propofol  per Anesthesia, Monitored Anesthesia Care Complications:            No immediate complications. Estimated Blood Loss:     Estimated blood loss: 5 mL. See above Procedure:                Pre-Anesthesia Assessment:                           - Prior to the procedure, a History and Physical                            was performed, and patient medications and                            allergies were reviewed. The patient's tolerance of                            previous anesthesia was also reviewed. The risks                            and benefits of the procedure and the sedation                            options and risks were discussed with the patient.  All questions were answered, and informed consent                            was obtained. Prior Anticoagulants: The patient has                            taken no anticoagulant or antiplatelet agents. ASA                            Grade Assessment: III - A patient with severe                             systemic disease. After reviewing the risks and                            benefits, the patient was deemed in satisfactory                            condition to undergo the procedure.                           After obtaining informed consent, the colonoscope                            was passed under direct vision. Throughout the                            procedure, the patient's blood pressure, pulse, and                            oxygen saturations were monitored continuously. The                            [id number]-[id number] ([id number]) colonoscope was introduced                            through the anus and advanced to the the cecum,                            identified by appendiceal orifice and ileocecal                            valve. The colonoscopy was performed with                            difficulty due to excessive bleeding, restricted                            mobility of the colon, significant looping and a                            tortuous colon. Successful completion of the  procedure was aided by straightening and shortening                            the scope to obtain bowel loop reduction, applying                            abdominal pressure and performing the maneuvers                            documented (below) in this report. The patient                            tolerated the procedure fairly well. The quality of                            the bowel preparation was adequate and good. The                            ileocecal valve, appendiceal orifice, and rectum                            were photographed. Scope In: 10:06:47 AM Scope Out: 10:36:01 AM Scope Withdrawal Time: 0 hours 13 minutes 43 seconds  Total Procedure Duration: 0 hours 29 minutes 14 seconds  Findings:      The perianal and digital rectal examinations were normal.      Scattered medium-mouthed and small-mouthed diverticula were found in the        sigmoid colon.      A small post polypectomy scar was found in the rectum. There was no       evidence of the previous polyp. This was biopsied with a cold forceps       for histology. Estimated blood loss: 5 mL. To stop active bleeding,       three hemostatic clips were successfully placed. There was no bleeding       at the end of the procedure. Area was successfully injected with 6 mL of       a 0.1 mg/mL solution of epinephrine  for hemostasis of bleeding caused by       the procedure. Estimated blood loss was minimal.      Internal hemorrhoids were found during retroflexion. The hemorrhoids       were small and Grade I (internal hemorrhoids that do not prolapse). Impression:               - Diverticulosis in the sigmoid colon.                           - Post-polypectomy scar in the rectum. Biopsied.                            Clips were placed. Injected.                           - Internal hemorrhoids. Moderate Sedation:      N/A - MAC procedure Recommendation:           - Patient has a contact number available for  emergencies. The signs and symptoms of potential                            delayed complications were discussed with the                            patient. Return to normal activities tomorrow.                            Written discharge instructions were provided to the                            patient.                           - High fiber diet.                           - Await pathology results.                           - Repeat colonoscopy for surveillance based on                            pathology results. Procedure Code(s):        --- Professional ---                           [phone], Colonoscopy, flexible; with control of                            bleeding, any method Diagnosis Code(s):        --- Professional ---                           Z86.010, Personal history of colonic polyps                           Z98.890, Other  specified postprocedural states                           K64.0, First degree hemorrhoids                           K57.30, Diverticulosis of large intestine without                            perforation or abscess without bleeding CPT copyright 2022 [employer]. All rights reserved. The codes documented in this report are preliminary and upon coder review may  be revised to meet current compliance requirements. Jerrell JAYSON Sol, MD 04/06/2024 10:49:39 AM This report has been signed electronically. Number of Addenda: 0

## 2024-04-07 NOTE — Anesthesia Postprocedure Evaluation (Signed)
 Anesthesia Post Note  Patient: Lynn Jenkins  Procedure(s) Performed: COLONOSCOPY     Patient location during evaluation: Endoscopy Anesthesia Type: MAC Level of consciousness: awake and alert Pain management: pain level controlled Vital Signs Assessment: post-procedure vital signs reviewed and stable Respiratory status: spontaneous breathing Cardiovascular status: stable Anesthetic complications: no   No notable events documented.  Last Vitals:  Vitals:   04/06/24 1110 04/06/24 1120  BP: 131/70 137/81  Pulse: 82 84  Resp: 18 18  Temp:    SpO2: 97% 98%    Last Pain:  Vitals:   04/06/24 1110  TempSrc:   PainSc: 5                  Norleen Pope

## 2024-04-08 LAB — LIPID PANEL
Chol/HDL Ratio: 5.5 ratio — ABNORMAL HIGH (ref 0.0–4.4)
Cholesterol, Total: 238 mg/dL — ABNORMAL HIGH (ref 100–199)
HDL: 43 mg/dL (ref >39)
LDL Chol Calc (NIH): 156 mg/dL — ABNORMAL HIGH (ref 0–99)
Triglycerides: 212 mg/dL — ABNORMAL HIGH (ref 0–149)
VLDL Cholesterol Cal: 39 mg/dL (ref 5–40)

## 2024-04-09 ENCOUNTER — Ambulatory Visit: Payer: Self-pay | Admitting: Cardiology

## 2024-04-09 LAB — SURGICAL PATHOLOGY

## 2024-04-09 NOTE — Progress Notes (Signed)
 Cholesterol remains elevated. Please confirm that patient is taking Crestor  20 mg daily.  If not, please emphasize compliance.  If she is taking Crestor  20 mg daily, recommend increasing to 40 mg daily.  Regardless, repeat lipid panel in 06/2024.  Thanks MJP

## 2024-04-13 DIAGNOSIS — M533 Sacrococcygeal disorders, not elsewhere classified: Secondary | ICD-10-CM | POA: Diagnosis not present

## 2024-04-13 DIAGNOSIS — E782 Mixed hyperlipidemia: Secondary | ICD-10-CM | POA: Diagnosis not present

## 2024-04-13 DIAGNOSIS — M8588 Other specified disorders of bone density and structure, other site: Secondary | ICD-10-CM | POA: Diagnosis not present

## 2024-04-13 DIAGNOSIS — L89302 Pressure ulcer of unspecified buttock, stage 2: Secondary | ICD-10-CM | POA: Diagnosis not present

## 2024-04-15 ENCOUNTER — Other Ambulatory Visit (HOSPITAL_BASED_OUTPATIENT_CLINIC_OR_DEPARTMENT_OTHER): Payer: Self-pay | Admitting: Family Medicine

## 2024-04-15 DIAGNOSIS — M8580 Other specified disorders of bone density and structure, unspecified site: Secondary | ICD-10-CM

## 2024-04-16 NOTE — Progress Notes (Signed)
 Noted.  Please repeat lipid panel in 06/2024.  Thanks MJP

## 2024-05-04 ENCOUNTER — Encounter: Payer: Self-pay | Admitting: Surgery

## 2024-05-04 NOTE — H&P (Signed)
 PROVIDER: Byron Peacock Lynn SPINNER, MD  MRN: [id number] DOB: 08/13/1945 DATE OF ENCOUNTER: 03/24/2024 Subjective   Chief Complaint: Follow-up (Persistent primary hyperparathyroidism)   History of Present Illness:  Patient returns to my practice for follow-up and to discuss surgery for persistent primary hyperparathyroidism. Patient was last evaluated in 2023. She underwent laboratory studies and imaging studies including a nuclear medicine parathyroid  scan with sestamibi. This localized a right inferior parathyroid  adenoma. Patient had undergone 2 prior neck explorations with a right sided parathyroidectomy at her initial procedure. She has had persistent primary hyperparathyroidism. Her most recent calcium  level remains elevated at 11.9. Ultrasound performed in October 2023 showed surgical absence of the left thyroid  lobe and stable nodules involving the right thyroid  lobe. Patient continues to be followed by her endocrinologist, Dr. Chyrl Alexander. She remains symptomatic with chronic fatigue, joint pain, and osteoporosis. She presents today to again discuss proceeding with neck exploration and parathyroidectomy.   Review of Systems: A complete review of systems was obtained from the patient. I have reviewed this information and discussed as appropriate with the patient. See HPI as well for other ROS.  Review of Systems  Constitutional: Positive for malaise/fatigue.  HENT: Negative.  Eyes: Negative.  Respiratory: Negative.  Cardiovascular: Negative.  Gastrointestinal: Negative.  Genitourinary: Negative.  Musculoskeletal: Positive for joint pain.  Skin: Negative.  Neurological: Negative.  Endo/Heme/Allergies: Negative.  Psychiatric/Behavioral: Negative.    Medical History: Past Medical History:  Diagnosis Date  Acid reflux  Arthritis  Glaucoma (increased eye pressure)  History of cataract  Lupus  Thyroid  disease  parathyroid   Vision abnormalities   Patient Active Problem List   Diagnosis  Primary open angle glaucoma (POAG) of left eye, severe stage  Primary open angle glaucoma (POAG) of right eye, moderate stage  Combined form of age-related cataract, right eye  CME (cystoid macular edema), left  Cornea guttata  Uveitis of left eye  Systemic lupus erythematosus (CMS/HHS-HCC)  Leukocytosis  Microcytic anemia  Pain of left hand  Primary hyperparathyroidism (HHS-HCC)  Varicose veins of bilateral lower extremities with other complications  History of lobectomy of thyroid   Hypothyroidism, iatrogenic  Postsurgical states following surgery of eye and adnexa  Disorder of bone  Fibromyalgia  Glaucoma  Hypercalcemia  Inflammatory arthritis  Osteoarthritis  Connective tissue disease (HHS-HCC)  Lupus  Obesity  Status post parathyroidectomy   Past Surgical History:  Procedure Laterality Date  Shoulder Sx Right 1982  GLAUCOMA EYE SURGERY Left 10/19/2018  [facility], Dr. Naomi  LENS EYE SURGERY Left 10/19/2018  CE IOL OS, [facility] Dr. Naomi  thyroid  surgery 10/2020  GLAUCOMA EYE SURGERY Left 03/01/2021  [facility] Dr Nilsa  GLAUCOMA EYE SURGERY Bilateral  Laser OU Dr,. Bevis  hernia repair surgery  knee surgery Right  LAPAROSCOPIC CHOLECYSTECTOMY  LAPAROSCOPIC REPAIR DIAPHRAGMATIC HERNIA    Allergies  Allergen Reactions  Doxycycline Swelling  Facial swelling, itching  Other Swelling  Zolmitriptan Nausea And Vomiting  Increased heart rate  Increased heart rate   Amoxicillin -Pot Clavulanate Diarrhea  Cefuroxime Axetil Other (See Comments)  Stomach ache  Gabapentin Other (See Comments)  Confusion, off balance  Ferrous Sulfate  Diarrhea  Methazolamide Diarrhea  Alphagan  [Brimonidine ] Itching  Cefdinir Rash  Clarithromycin Rash  Dorzolamide  Itching  Lumigan [Bimatoprost] Itching  Morphine Swelling  Sumatriptan  Other (See Comments)  Can use brand Imitrex   Travatan [Travoprost (Benzalkonium)] Itching   Current Outpatient Medications on File Prior to  Visit  Medication Sig Dispense Refill  acetaminophen  (TYLENOL ) 500 MG tablet Take 500 mg  by mouth every 6 (six) hours as needed  ascorbic acid , vitamin C , (VITAMIN C ) 500 MG tablet Take 500 mg by mouth once daily (Patient not taking: Reported on 09/17/2023)  atorvastatin (LIPITOR) 20 MG tablet TK 1 T PO QD (Patient not taking: Reported on 09/17/2023)  atropine 1 % ophthalmic solution (Patient not taking: Reported on 09/17/2023)  brimonidine  (ALPHAGAN ) 0.2 % ophthalmic solution Place 1 drop into the left eye 3 (three) times daily 20 mL 4  bromfenac (PROLENSA) 0.07 % ophthalmic solution Place 1 drop into the left eye once daily (Patient not taking: Reported on 09/17/2023) 3 mL 1  cholecalciferol (VITAMIN D3) 1,000 unit tablet Take 1,000 Units by mouth once daily (Patient not taking: Reported on 09/17/2023)  co-enzyme Q-10, ubiquinone, 30 mg capsule Take 30 mg by mouth once daily (Patient not taking: Reported on 09/17/2023)  cyanocobalamin (VITAMIN B12) 1000 MCG tablet Take 1,000 mcg by mouth once daily (Patient not taking: Reported on 09/17/2023)  cyclopentolate (CYCLOGYL) 1 % ophthalmic solution Place 1 drop into the left eye 2 (two) times daily (Patient not taking: Reported on 09/17/2023) 5 mL 1  docosahexaenoic acid-epa 120-180 mg Cap Take 1 capsule by mouth once daily (Patient not taking: Reported on 09/17/2023)  docusate (COLACE) 250 MG capsule Take 250 mg by mouth once daily (Patient not taking: Reported on 09/17/2023)  dorzolamide -timolol  (COSOPT  PF) 2-0.5 % ophthalmic solution Place 1 drop into the right eye 2 (two) times daily (Patient not taking: Reported on 09/17/2023) 60 Blister Pack 12  dorzolamide -timoloL  (COSOPT ) 22.3-6.8 mg/mL ophthalmic solution Place 1 drop into the right eye 2 (two) times daily (Patient not taking: Reported on 09/17/2023) 30 mL 4  esomeprazole (NEXIUM) 40 MG DR capsule Take 40 mg by mouth once daily (Patient not taking: Reported on 09/17/2023)  fenofibrate  160 MG tablet Take 160 mg by mouth  once daily (Patient not taking: Reported on 09/17/2023)  ferrous sulfate  325 (65 FE) MG tablet Take 325 mg by mouth daily with breakfast (Patient not taking: Reported on 09/17/2023)  FUROsemide  (LASIX ) 20 MG tablet Take 20 mg by mouth once daily (Patient not taking: Reported on 09/17/2023)  glucosam su dip-chondroit-C-Mn 500-400-66-3 mg Cap Take 1 tablet by mouth once daily (Patient not taking: Reported on 09/17/2023)  hydrOXYchloroQUINE  (PLAQUENIL ) 200 mg tablet (Patient not taking: Reported on 09/17/2023)  latanoprost  (XALATAN ) 0.005 % ophthalmic solution Place 1 drop into the right eye at bedtime 7.5 mL 3  levothyroxine  (SYNTHROID ) 50 MCG tablet Take 1 tablet (50 mcg total) by mouth once daily Take on an empty stomach with a glass of water  at least 30-60 minutes before breakfast. 30 tablet 11  meloxicam (MOBIC) 15 MG tablet Take 15 mg by mouth once daily (Patient not taking: Reported on 09/17/2023)  methazolAMIDE (NEPTAZANE) 50 MG tablet Take 1 tablet (50 mg total) by mouth 3 (three) times daily (Patient not taking: Reported on 04/27/2021) 90 tablet 11  metoclopramide  (REGLAN ) 10 MG tablet Take by mouth nightly as needed  moxifloxacin (VIGAMOX) 0.5 % ophthalmic solution (Patient not taking: Reported on 09/17/2023)  netarsudiL  (RHOPRESSA) 0.02 % eye drops Place 1 drop into the left eye nightly (Patient not taking: Reported on 09/17/2023) 7.5 mL 4  ondansetron  (ZOFRAN -ODT) 4 MG disintegrating tablet Take by mouth nightly as needed (Patient not taking: Reported on 09/17/2023)  pravastatin (PRAVACHOL) 20 MG tablet Take 20 mg by mouth nightly (Patient not taking: Reported on 09/17/2023)  prednisoLONE acetate (PRED FORTE) 1 % ophthalmic suspension Insert one drop into the surgical  eye twice a day for 7 days, then stop. (Patient not taking: Reported on 09/17/2023) 5 mL 0  predniSONE (DELTASONE) 5 MG tablet (Patient not taking: Reported on 09/17/2023)  sertraline  (ZOLOFT ) 100 MG tablet Take 100 mg by mouth once daily  sodium  chloride (MURO 128) 5 % ophthalmic ointment Apply a thin strip to the eye at bedtime (Patient not taking: Reported on 09/17/2023) 3.5 g 3  SUMAtriptan  (IMITREX ) 100 MG tablet Take 100 mg by mouth once as needed  timoloL  maleate (TIMOPTIC ) 0.5 % ophthalmic solution Place 1 drop into the right eye 2 (two) times daily 10 mL 11   No current facility-administered medications on file prior to visit.   Family History  Problem Relation Age of Onset  High blood pressure (Hypertension) Father  Cancer Brother    Social History   Tobacco Use  Smoking Status Never  Smokeless Tobacco Never    Social History   Socioeconomic History  Marital status: Married  Tobacco Use  Smoking status: Never  Smokeless tobacco: Never  Vaping Use  Vaping status: Never Used  Substance and Sexual Activity  Alcohol use: Not Currently  Drug use: Never   Social Drivers of Health   Food Insecurity: No Food Insecurity (10/15/2022)  Received [facility]  Hunger Vital Sign  Within the past 12 months, you worried that your food would run out before you got the money to buy more.: Never true  Within the past 12 months, the food you bought just didn't last and you didn't have money to get more.: Never true  Transportation Needs: No Transportation Needs (10/15/2022)  Received from [facility] - Transportation  Lack of Transportation (Medical): No  Lack of Transportation (Non-Medical): No  Housing Stability: Unknown (03/24/2024)  Housing Stability Vital Sign  Homeless in the Last Year: No   Objective:   Vitals:  03/24/24 1041 03/24/24 1042  BP: (!) 152/88  Pulse: 74  Resp: 16  Temp: 36.7 C (98.1 F)  SpO2: 98%  Weight: 95.9 kg (211 lb 6.4 oz)  Height: 157.5 cm (5' 2)  PainSc: 0-No pain   Body mass index is 38.67 kg/m.  Physical Exam   GENERAL APPEARANCE Comfortable, no acute issues Development: normal Gross deformities: none  SKIN Rash, lesions, ulcers: none Induration, erythema:  none Nodules: none palpable  EYES Conjunctiva and lids: normal Pupils: equal  EARS, NOSE, MOUTH, THROAT External ears: no lesion or deformity External nose: no lesion or deformity Hearing: grossly normal  NECK Symmetric: yes Trachea: midline Thyroid : Well-healed anterior cervical incision  CHEST/CV Not assessed  ABDOMEN Not assessed  GENITOURINARY/RECTAL Not assessed  MUSCULOSKELETAL Station and gait: normal Digits and nails: no clubbing or cyanosis Muscle strength: grossly normal all extremities Deformity: none  LYMPHATIC Cervical: none palpable Supraclavicular: none palpable  PSYCHIATRIC Oriented to person, place, and time: yes Mood and affect: normal for situation Judgment and insight: appropriate for situation   Assessment and Plan:   Primary hyperparathyroidism (HHS-HCC) Status post parathyroidectomy History of lobectomy of thyroid   Patient returns to my practice to discuss neck exploration and parathyroidectomy for persistent primary hyperparathyroidism. Today we reviewed her clinical history. We reviewed her most recent laboratory studies and her most recent imaging studies.  In 2023, we plan to proceed with neck exploration and parathyroidectomy. There appears to be a parathyroid  adenoma in the right inferior position. This could possibly be intrathyroidal. There are nodules in the right thyroid  lobe. We discussed the possibility of completion thyroidectomy with right  thyroid  lobectomy at the time of surgery. Patient is currently on thyroid  medication, taking levothyroxine  88 mcg daily.  I would like to plan neck exploration in the near future. We will hopefully be able to identify a right inferior parathyroid  adenoma. If not we will plan to proceed with right thyroid  lobectomy. I have recommended an overnight hospital stay in order to monitor her serum calcium  levels following surgery. We again discussed the risk of the procedure including the risk of  recurrent nerve injury. Patient understands and agrees to proceed. We will make arrangements for surgery at [facility] in November 2025.  Lynn Spinner, MD [facility] practice Office: [phone]

## 2024-05-04 NOTE — Patient Instructions (Signed)
 SURGICAL WAITING ROOM VISITATION Patients having surgery or a procedure may have no more than 2 support people in the waiting area - these visitors may rotate in the visitor waiting room.   If the patient needs to stay at the hospital during part of their recovery, the visitor guidelines for inpatient rooms apply.  PRE-OP VISITATION  Pre-op nurse will coordinate an appropriate time for 1 support person to accompany the patient in pre-op.  This support person may not rotate.  This visitor will be contacted when the time is appropriate for the visitor to come back in the pre-op area.  Please refer to the [facility] website for the visitor guidelines for Inpatients (after your surgery is over and you are in a regular room).  You are not required to quarantine at this time prior to your surgery. However, you must do this: Hand Hygiene often Do NOT share personal items Notify your provider if you are in close contact with someone who has COVID or you develop fever 100.4 or greater, new onset of sneezing, cough, sore throat, shortness of breath or body aches.  If you test positive for Covid or have been in contact with anyone that has tested positive in the last 10 days please notify you surgeon.    Your procedure is scheduled on:  Monday  05-17-2024  Report to [facility] Main Entrance: [address] entrance where the [employer] is available.   Report to admitting at: 11:15    AM  Call this number if you have any questions or problems the morning of surgery [phone]  FOLLOW ANY ADDITIONAL PRE OP INSTRUCTIONS YOU RECEIVED FROM YOUR SURGEON'S OFFICE!!!  Do not eat food after Midnight the night prior to your surgery/procedure.  After Midnight you may have the following liquids until   10:30 AM  DAY OF SURGERY  Clear Liquid Diet Water  Black Coffee (sugar ok, NO MILK/CREAM OR CREAMERS)  Tea (sugar ok, NO MILK/CREAM OR CREAMERS) regular and decaf                             Plain  Jell-O  with no fruit (NO RED)                                           Fruit ices (not with fruit pulp, NO RED)                                     Popsicles (NO RED)                                                                  Juice: NO CITRUS JUICES: only apple, WHITE grape, WHITE cranberry Sports drinks like Gatorade or Powerade (NO RED)                  Oral Hygiene is also important to reduce your risk of infection.        Remember - BRUSH YOUR TEETH THE MORNING OF SURGERY WITH YOUR REGULAR TOOTHPASTE  Do  NOT smoke after Midnight the night before surgery.  STOP TAKING all Vitamins, Herbs and supplements 1 week before your surgery.   Take ONLY these medicines the morning of surgery with A SIP OF WATER : levothyroxine , sertraline , Tylenol  if needed and you may use your  Eye drops   DO NOT TAKE Furosemide  the morning of your surgery.   If You have been diagnosed with Sleep Apnea - Bring CPAP mask and tubing day of surgery. We will provide you with a CPAP machine on the day of your surgery.                   You may not have any metal on your body including hair pins, jewelry, and body piercing  Do not wear make-up, lotions, powders, perfumes  or deodorant  Do not wear nail polish including gel and S&S, artificial / acrylic nails, or any other type of covering on natural nails including finger and toenails. If you have artificial nails, gel coating, etc., that needs to be removed by a nail salon, Please have this removed prior to surgery. Not doing so may mean that your surgery could be cancelled or delayed if the Surgeon or anesthesia staff feels like they are unable to monitor you safely.   Do not shave 48 hours prior to surgery to avoid nicks in your skin which may contribute to postoperative infections.    Contacts, Hearing Aids, dentures or bridgework may not be worn into surgery. DENTURES WILL BE REMOVED PRIOR TO SURGERY PLEASE DO NOT APPLY Poly grip OR  ADHESIVES!!!  You may bring a small overnight bag with you on the day of surgery, only pack items that are not valuable. [facility] IS NOT RESPONSIBLE   FOR VALUABLES THAT ARE LOST OR STOLEN.   Do not bring your home medications to the hospital. The Pharmacy will dispense medications listed on your medication list to you during your admission in the Hospital.  Special Instructions: Bring a copy of your healthcare power of attorney and living will documents the day of surgery, if you wish to have them scanned into your [facility] Records- EPIC  Please read over the following fact sheets you were given: IF YOU HAVE QUESTIONS ABOUT YOUR PRE-OP INSTRUCTIONS, PLEASE CALL [phone]   [facility] - Preparing for Surgery        Before surgery, you can play an important role.  Because skin is not sterile, your skin needs to be as free of germs as possible.  You can reduce the number of germs on your skin by washing with CHG (chlorahexidine gluconate) soap before surgery.  CHG is an antiseptic cleaner which kills germs and bonds with the skin to continue killing germs even after washing. Please DO NOT use if you have an allergy to CHG or antibacterial soaps.  If your skin becomes reddened/irritated stop using the CHG and inform your nurse when you arrive at Short Stay. Do not shave (including legs and underarms) for at least 48 hours prior to the first CHG shower.  You may shave your face/neck.  Please follow these instructions carefully:  1.  Shower with CHG Soap the night before surgery ONLY (DO NOT USE THE CHG SOAP THE MORNING OF SURGERY).  2.  If you choose to wash your hair, wash your hair first as usual with your normal  shampoo.  3.  After you shampoo, rinse your hair and body thoroughly to remove the shampoo.  4.  Use CHG as you would any other liquid soap.  You can apply chg directly to the skin and wash.  Gently with a scrungie or clean washcloth.  5.   Apply the CHG Soap to your body ONLY FROM THE NECK DOWN.   Do not use on face/ open                           Wound or open sores. Avoid contact with eyes, ears mouth and genitals (private parts).                       Wash face,  Genitals (private parts) with your normal soap.             6.  Wash thoroughly, paying special attention to the area where your  surgery  will be performed.  7.  Thoroughly rinse your body with warm water  from the neck down.  8.  DO NOT shower/wash with your normal soap after using and rinsing off the CHG Soap.                9.  Pat yourself dry with a clean towel.            10.  Wear clean pajamas.            11.  Place clean sheets on your bed the night of your first shower and do not  sleep with pets.  Day of Surgery : Do not apply any CHG, lotions/deodorants the morning of surgery.  Please wear clean clothes to the hospital/surgery center.   FAILURE TO FOLLOW THESE INSTRUCTIONS MAY RESULT IN THE CANCELLATION OF YOUR SURGERY  PATIENT SIGNATURE_________________________________  NURSE SIGNATURE__________________________________  ________________________________________________________________________

## 2024-05-04 NOTE — Progress Notes (Signed)
 COVID Vaccine received:  []  No [x]  Yes Date of any COVID positive Test in last 90 days:  PCP - Carlin Dale Gull, MD  Cardiologist - Newman Lawrence, MD cardiac clearance in 04-07-24 note  Chest x-ray - CT Chest  08-2022  EKG - 04-07-2024  Epic Stress Test -  ECHO - 02-06-2024  Epic Cardiac Cath -  CT Coronary Calcium  score:   Pacemaker / ICD device [x]  No []  Yes   Spinal Cord Stimulator:[x]  No []  Yes       History of Sleep Apnea? []  No []  Yes   CPAP used?- []  No []  Yes    MED RECON. 04-29-24  Medication on DOS: levothyroxine , sertraline , Tylenol  Eye drops  Hold DOS:  Furosemide ,   Patient has: [x]  NO Hx DM   []  Pre-DM   []  DM1  []   DM2 Does the patient monitor blood sugar?   [x]  N/A   []  No []  Yes   Blood Thinner / Instructions:  none Aspirin Instructions:  none  Comments:   Activity level: Able to walk up 2 flights of stairs without becoming significantly short of breath or having chest pain?  []  No   []    Yes  Patient can perform ADLs without assistance. []  No   []   Yes  Anesthesia review: CKD3, GERD, anemia, Glaucoma, Migraines, GAD, Lupus,   Patient denies any S&S of respiratory illness or Covid - no shortness of breath, fever, cough or chest pain at PAT appointment.  Patient verbalized understanding and agreement to the Pre-Surgical Instructions that were given to them at this PAT appointment. Patient was also educated of the need to review these PAT instructions again prior to her surgery.I reviewed the appropriate phone numbers to call if they have any and questions or concerns.

## 2024-05-05 ENCOUNTER — Encounter (HOSPITAL_COMMUNITY)
Admission: RE | Admit: 2024-05-05 | Discharge: 2024-05-05 | Disposition: A | Discharge status: Home / Self Care | Source: Ambulatory Visit | Attending: Surgery | Admitting: Surgery

## 2024-05-05 ENCOUNTER — Other Ambulatory Visit: Payer: Self-pay

## 2024-05-05 ENCOUNTER — Encounter (HOSPITAL_COMMUNITY): Payer: Self-pay

## 2024-05-05 VITALS — BP 142/70 | HR 64 | Temp 98.4°F | Resp 20 | Ht 64.0 in | Wt 200.0 lb

## 2024-05-05 DIAGNOSIS — Z79899 Other long term (current) drug therapy: Secondary | ICD-10-CM | POA: Insufficient documentation

## 2024-05-05 DIAGNOSIS — N189 Chronic kidney disease, unspecified: Secondary | ICD-10-CM | POA: Diagnosis not present

## 2024-05-05 DIAGNOSIS — D509 Iron deficiency anemia, unspecified: Secondary | ICD-10-CM | POA: Diagnosis not present

## 2024-05-05 DIAGNOSIS — D631 Anemia in chronic kidney disease: Secondary | ICD-10-CM | POA: Diagnosis not present

## 2024-05-05 DIAGNOSIS — I129 Hypertensive chronic kidney disease with stage 1 through stage 4 chronic kidney disease, or unspecified chronic kidney disease: Secondary | ICD-10-CM | POA: Diagnosis not present

## 2024-05-05 DIAGNOSIS — Z01812 Encounter for preprocedural laboratory examination: Secondary | ICD-10-CM | POA: Insufficient documentation

## 2024-05-05 DIAGNOSIS — K219 Gastro-esophageal reflux disease without esophagitis: Secondary | ICD-10-CM | POA: Insufficient documentation

## 2024-05-05 DIAGNOSIS — E210 Primary hyperparathyroidism: Secondary | ICD-10-CM | POA: Diagnosis not present

## 2024-05-05 DIAGNOSIS — Z01818 Encounter for other preprocedural examination: Secondary | ICD-10-CM

## 2024-05-05 HISTORY — DX: Chronic kidney disease, unspecified: N18.9

## 2024-05-05 HISTORY — DX: Pneumonia, unspecified organism: J18.9

## 2024-05-05 LAB — CBC
HCT: 40.7 % (ref 36.0–46.0)
Hemoglobin: 12.8 g/dL (ref 12.0–15.0)
MCH: 28.7 pg (ref 26.0–34.0)
MCHC: 31.4 g/dL (ref 30.0–36.0)
MCV: 91.3 fL (ref 80.0–100.0)
Platelets: 185 K/uL (ref 150–400)
RBC: 4.46 MIL/uL (ref 3.87–5.11)
RDW: 15.1 % (ref 11.5–15.5)
WBC: 7.9 K/uL (ref 4.0–10.5)
nRBC: 0 % (ref 0.0–0.2)

## 2024-05-05 LAB — BASIC METABOLIC PANEL WITH GFR
Anion gap: 8 (ref 5–15)
BUN: 22 mg/dL (ref 8–23)
CO2: 26 mmol/L (ref 22–32)
Calcium: 12.6 mg/dL — ABNORMAL HIGH (ref 8.9–10.3)
Chloride: 107 mmol/L (ref 98–111)
Creatinine, Ser: 1.08 mg/dL — ABNORMAL HIGH (ref 0.44–1.00)
GFR, Estimated: 53 mL/min — ABNORMAL LOW (ref >60)
Glucose, Bld: 104 mg/dL — ABNORMAL HIGH (ref 70–99)
Potassium: 4.5 mmol/L (ref 3.5–5.1)
Sodium: 141 mmol/L (ref 135–145)

## 2024-05-07 NOTE — Progress Notes (Signed)
 Anesthesia Chart Review   Case: [id number] Date/Time: 05/17/24 1321   Procedures:      PARATHYROIDECTOMY - NECK EXPLORATION WITH PARATHYROIDECTOMY     THYROIDECTOMY - POSSIBLE COMPLETION THYROIDECTOMY   Anesthesia type: General   Pre-op diagnosis: PERSISTENT PRIMARY HYPERPARATHYROIDISM   Location[facility] ROOM 01 / [facility]   Surgeons: Eletha Boas, MD       DISCUSSION:78 y.o. never smoker with h/o GERD, CKD, hyperparathyroidism scheduled for above procedure 05/17/2024 with Dr. Boas Eletha.   Pt seen by cardiology 04/07/2024 for preoperative evaluation.  Per OV note, Basal septal thickening, but otherwise reassuring echocardiogram.  No clearance symptoms of chest pain or shortness of breath at this time.  Reasonable to proceed with upcoming thyroid  surgery with low cardiac risk. Blood pressure is controlled without metoprolol , okay to hold. She is currently on no antiplatelet therapy.  No absolute indication.  VS: BP (!) 142/70 Comment: right arm sitting  Pulse 64   Temp 36.9 C (Oral)   Resp 20   Ht 5' 4 (1.626 m)   Wt 90.7 kg   SpO2 96%   BMI 34.33 kg/m   PROVIDERS: Okey Carlin Redbird, MD is PCP   Cardiologist - Newman Lawrence, MD  LABS: Labs reviewed: Acceptable for surgery. (all labs ordered are listed, but only abnormal results are displayed)  Labs Reviewed  BASIC METABOLIC PANEL WITH GFR - Abnormal; Notable for the following components:      Result Value   Glucose, Bld 104 (*)    Creatinine, Ser 1.08 (*)    Calcium  12.6 (*)    GFR, Estimated 53 (*)    All other components within normal limits  CBC     IMAGES:   EKG:   CV: Echo 02/06/2024 1. Prtoximal septal thickening . Left ventricular ejection fraction, by  estimation, is 60 to 65%. The left ventricle has normal function. The left  ventricle has no regional wall motion abnormalities. There is mild left  ventricular hypertrophy. Left  ventricular diastolic parameters are indeterminate. The average left   ventricular global longitudinal strain is -20.2 %.   2. Right ventricular systolic function is normal. The right ventricular  size is normal.   3. The mitral valve is normal in structure. Mild mitral valve  regurgitation.   4. The aortic valve is tricuspid. Aortic valve regurgitation is mild.  Aortic valve sclerosis/calcification is present, without any evidence of  aortic stenosis.   Past Medical History:  Diagnosis Date   Anemia    Arthritis    Chronic kidney disease    Generalized anxiety disorder    GERD (gastroesophageal reflux disease)    Glaucoma    Headache    hx of migraines    History of hiatal hernia    Hyperthyroidism    Lupus    Migraine    Mixed hyperlipidemia    Multinodular goiter    Osteopenia    Pneumonia    Primary hyperparathyroidism    Varicose veins    Vitamin D  deficiency     Past Surgical History:  Procedure Laterality Date   ABDOMINAL HYSTERECTOMY     APPENDECTOMY     CHOLECYSTECTOMY N/A 03/11/2022   Procedure: LAPAROSCOPIC CHOLECYSTECTOMY WITH icg dye;  Surgeon: Signe Mitzie LABOR, MD;  Location: [facility];  Service: General;  Laterality: N/A;   COLONOSCOPY N/A 04/06/2024   Procedure: COLONOSCOPY;  Surgeon: Dianna Specking, MD;  Location[facility];  Service: Gastroenterology;  Laterality: N/A;   EYE SURGERY  FRACTURE SURGERY Right 2023   knee   INSERTION OF MESH  10/15/2022   Procedure: INSERTION OF MESH;  Surgeon: Rubin Calamity, MD;  Location: [facility] OR;  Service: General;;   left foot surgery      PARATHYROIDECTOMY N/A 11/01/2014   Procedure: NECK EXPLORATION AND PARATHYROIDECTOMY;  Surgeon: Krystal Spinner, MD;  Location[facility];  Service: General;  Laterality: N/A;   PARATHYROIDECTOMY N/A 10/31/2020   Procedure: PARATHYROIDECTOMY WITH LEFT THYROID  LOBECTOMY;  Surgeon: Spinner Krystal, MD;  Location[facility];  Service: General;  Laterality: N/A;   SHOULDER SURGERY     XI ROBOTIC ASSISTED HIATAL HERNIA REPAIR N/A 10/15/2022   Procedure: XI  ROBOTIC ASSISTED HIATAL HERNIA REPAIR WITH FUNDOPLICATION;  Surgeon: Rubin Calamity, MD;  Location: [facility] OR;  Service: General;  Laterality: N/A;    MEDICATIONS:  acetaminophen  (TYLENOL ) 500 MG tablet   ciclopirox  (PENLAC ) 8 % solution   furosemide  (LASIX ) 20 MG tablet   levothyroxine  (SYNTHROID ) 88 MCG tablet   loperamide (IMODIUM A-D) 2 MG tablet   methocarbamol  (ROBAXIN ) 500 MG tablet   metoCLOPramide  (REGLAN ) 10 MG tablet   Multiple Vitamin (MULTIVITAMIN WITH MINERALS) TABS tablet   nitroGLYCERIN  (NITROSTAT ) 0.4 MG SL tablet   ondansetron  (ZOFRAN -ODT) 4 MG disintegrating tablet   REFRESH PLUS 0.5 % SOLN   rosuvastatin  (CRESTOR ) 20 MG tablet   scopolamine  (TRANSDERM-SCOP) 1 MG/3DAYS   sertraline  (ZOLOFT ) 100 MG tablet   timolol  (TIMOPTIC ) 0.5 % ophthalmic solution   XALATAN  0.005 % ophthalmic solution   No current facility-administered medications for this encounter.    Harlene Hoots Ward, PA-C [facility] [phone]

## 2024-05-17 ENCOUNTER — Inpatient Hospital Stay (HOSPITAL_COMMUNITY)
Admission: RE | Admit: 2024-05-17 | Discharge: 2024-05-19 | DRG: 627 | Disposition: A | Source: Ambulatory Visit | Attending: Surgery | Admitting: Surgery

## 2024-05-17 ENCOUNTER — Other Ambulatory Visit: Payer: Self-pay

## 2024-05-17 ENCOUNTER — Inpatient Hospital Stay (HOSPITAL_COMMUNITY): Payer: Self-pay

## 2024-05-17 ENCOUNTER — Ambulatory Visit (HOSPITAL_COMMUNITY): Payer: Self-pay | Admitting: Physician Assistant

## 2024-05-17 ENCOUNTER — Encounter (HOSPITAL_COMMUNITY): Admission: RE | Disposition: A | Payer: Self-pay | Source: Ambulatory Visit | Attending: Surgery

## 2024-05-17 ENCOUNTER — Encounter (HOSPITAL_COMMUNITY): Payer: Self-pay | Admitting: Surgery

## 2024-05-17 DIAGNOSIS — Z6838 Body mass index (BMI) 38.0-38.9, adult: Secondary | ICD-10-CM | POA: Diagnosis not present

## 2024-05-17 DIAGNOSIS — H401123 Primary open-angle glaucoma, left eye, severe stage: Secondary | ICD-10-CM | POA: Diagnosis present

## 2024-05-17 DIAGNOSIS — D351 Benign neoplasm of parathyroid gland: Secondary | ICD-10-CM | POA: Diagnosis present

## 2024-05-17 DIAGNOSIS — E041 Nontoxic single thyroid nodule: Secondary | ICD-10-CM | POA: Diagnosis not present

## 2024-05-17 DIAGNOSIS — Z6834 Body mass index (BMI) 34.0-34.9, adult: Secondary | ICD-10-CM | POA: Diagnosis not present

## 2024-05-17 DIAGNOSIS — Z809 Family history of malignant neoplasm, unspecified: Secondary | ICD-10-CM | POA: Diagnosis not present

## 2024-05-17 DIAGNOSIS — R112 Nausea with vomiting, unspecified: Secondary | ICD-10-CM | POA: Diagnosis not present

## 2024-05-17 DIAGNOSIS — F419 Anxiety disorder, unspecified: Secondary | ICD-10-CM | POA: Diagnosis not present

## 2024-05-17 DIAGNOSIS — E210 Primary hyperparathyroidism: Secondary | ICD-10-CM | POA: Diagnosis present

## 2024-05-17 DIAGNOSIS — E039 Hypothyroidism, unspecified: Secondary | ICD-10-CM | POA: Diagnosis present

## 2024-05-17 DIAGNOSIS — M329 Systemic lupus erythematosus, unspecified: Secondary | ICD-10-CM | POA: Diagnosis present

## 2024-05-17 DIAGNOSIS — I83893 Varicose veins of bilateral lower extremities with other complications: Secondary | ICD-10-CM | POA: Diagnosis present

## 2024-05-17 DIAGNOSIS — M810 Age-related osteoporosis without current pathological fracture: Secondary | ICD-10-CM | POA: Diagnosis present

## 2024-05-17 DIAGNOSIS — H401112 Primary open-angle glaucoma, right eye, moderate stage: Secondary | ICD-10-CM | POA: Diagnosis present

## 2024-05-17 DIAGNOSIS — Z79890 Hormone replacement therapy (postmenopausal): Secondary | ICD-10-CM | POA: Diagnosis not present

## 2024-05-17 DIAGNOSIS — E669 Obesity, unspecified: Secondary | ICD-10-CM | POA: Diagnosis present

## 2024-05-17 DIAGNOSIS — Z888 Allergy status to other drugs, medicaments and biological substances status: Secondary | ICD-10-CM | POA: Diagnosis not present

## 2024-05-17 DIAGNOSIS — Z880 Allergy status to penicillin: Secondary | ICD-10-CM | POA: Diagnosis not present

## 2024-05-17 DIAGNOSIS — H25811 Combined forms of age-related cataract, right eye: Secondary | ICD-10-CM | POA: Diagnosis present

## 2024-05-17 DIAGNOSIS — Z8249 Family history of ischemic heart disease and other diseases of the circulatory system: Secondary | ICD-10-CM | POA: Diagnosis not present

## 2024-05-17 DIAGNOSIS — E782 Mixed hyperlipidemia: Secondary | ICD-10-CM | POA: Diagnosis not present

## 2024-05-17 DIAGNOSIS — Z885 Allergy status to narcotic agent status: Secondary | ICD-10-CM | POA: Diagnosis not present

## 2024-05-17 DIAGNOSIS — Z79899 Other long term (current) drug therapy: Secondary | ICD-10-CM | POA: Diagnosis not present

## 2024-05-17 DIAGNOSIS — M797 Fibromyalgia: Secondary | ICD-10-CM | POA: Diagnosis present

## 2024-05-17 DIAGNOSIS — Z881 Allergy status to other antibiotic agents status: Secondary | ICD-10-CM | POA: Diagnosis not present

## 2024-05-17 DIAGNOSIS — K219 Gastro-esophageal reflux disease without esophagitis: Secondary | ICD-10-CM | POA: Diagnosis present

## 2024-05-17 HISTORY — PX: PARATHYROIDECTOMY: SHX19

## 2024-05-17 SURGERY — PARATHYROIDECTOMY
Anesthesia: General | Site: Neck

## 2024-05-17 MED ORDER — HEMOSTATIC AGENTS (NO CHARGE) OPTIME
TOPICAL | Status: DC | PRN
  Administered 2024-05-17: 1 via TOPICAL

## 2024-05-17 MED ORDER — CHLORHEXIDINE GLUCONATE CLOTH 2 % EX PADS: 6.0000 | MEDICATED_PAD | Freq: Once | CUTANEOUS | Status: DC

## 2024-05-17 MED ORDER — TIMOLOL MALEATE 0.5 % OP SOLN
1.0000 [drp] | Freq: Two times a day (BID) | OPHTHALMIC | Status: DC
  Administered 2024-05-17 – 2024-05-19 (×4): 1 [drp] via OPHTHALMIC
  Filled 2024-05-17: qty 5

## 2024-05-17 MED ORDER — LIDOCAINE 2% (20 MG/ML) 5 ML SYRINGE
INTRAMUSCULAR | Status: DC | PRN
  Administered 2024-05-17: 80 mg via INTRAVENOUS

## 2024-05-17 MED ORDER — ONDANSETRON HCL 4 MG/2ML IJ SOLN
4.0000 mg | Freq: Four times a day (QID) | INTRAMUSCULAR | Status: DC | PRN
  Administered 2024-05-17 – 2024-05-18 (×3): 4 mg via INTRAVENOUS
  Filled 2024-05-17 (×3): qty 2

## 2024-05-17 MED ORDER — HYDRALAZINE HCL 20 MG/ML IJ SOLN
INTRAMUSCULAR | Status: AC
  Filled 2024-05-17: qty 1

## 2024-05-17 MED ORDER — FENTANYL CITRATE (PF) 50 MCG/ML IJ SOSY
25.0000 ug | PREFILLED_SYRINGE | INTRAMUSCULAR | Status: DC | PRN
  Administered 2024-05-17 (×2): 25 ug via INTRAVENOUS

## 2024-05-17 MED ORDER — TRAMADOL HCL 50 MG PO TABS: 50.0000 mg | ORAL_TABLET | Freq: Four times a day (QID) | ORAL | Status: DC | PRN

## 2024-05-17 MED ORDER — PROPOFOL 10 MG/ML IV BOLUS
INTRAVENOUS | Status: AC
  Filled 2024-05-17: qty 20

## 2024-05-17 MED ORDER — BUPIVACAINE HCL 0.25 % IJ SOLN
INTRAMUSCULAR | Status: DC | PRN
  Administered 2024-05-17: 10 mL

## 2024-05-17 MED ORDER — ORAL CARE MOUTH RINSE: 15.0000 mL | Freq: Once | OROMUCOSAL | Status: AC

## 2024-05-17 MED ORDER — SODIUM CHLORIDE 0.45 % IV SOLN: INTRAVENOUS | Status: DC

## 2024-05-17 MED ORDER — CHLORHEXIDINE GLUCONATE 0.12 % MT SOLN
15.0000 mL | Freq: Once | OROMUCOSAL | Status: AC
  Administered 2024-05-17: 15 mL via OROMUCOSAL

## 2024-05-17 MED ORDER — OXYCODONE HCL 5 MG PO TABS: 5.0000 mg | ORAL_TABLET | ORAL | Status: DC | PRN

## 2024-05-17 MED ORDER — ACETAMINOPHEN 500 MG PO TABS
1000.0000 mg | ORAL_TABLET | Freq: Once | ORAL | Status: AC
  Administered 2024-05-17: 1000 mg via ORAL
  Filled 2024-05-17: qty 2

## 2024-05-17 MED ORDER — FENTANYL CITRATE (PF) 50 MCG/ML IJ SOSY
PREFILLED_SYRINGE | INTRAMUSCULAR | Status: AC
  Filled 2024-05-17: qty 1

## 2024-05-17 MED ORDER — SODIUM CHLORIDE 0.9 % IV SOLN
12.5000 mg | INTRAVENOUS | Status: DC | PRN
  Filled 2024-05-17: qty 0.5

## 2024-05-17 MED ORDER — CIPROFLOXACIN IN D5W 400 MG/200ML IV SOLN
400.0000 mg | INTRAVENOUS | Status: AC
  Administered 2024-05-17: 400 mg via INTRAVENOUS
  Filled 2024-05-17: qty 200

## 2024-05-17 MED ORDER — FENTANYL CITRATE (PF) 100 MCG/2ML IJ SOLN
INTRAMUSCULAR | Status: AC
  Filled 2024-05-17: qty 2

## 2024-05-17 MED ORDER — ROCURONIUM BROMIDE 10 MG/ML (PF) SYRINGE
PREFILLED_SYRINGE | INTRAVENOUS | Status: DC | PRN
  Administered 2024-05-17: 50 mg via INTRAVENOUS

## 2024-05-17 MED ORDER — LEVOTHYROXINE SODIUM 88 MCG PO TABS
88.0000 ug | ORAL_TABLET | Freq: Every day | ORAL | Status: DC
  Administered 2024-05-18 – 2024-05-19 (×2): 88 ug via ORAL
  Filled 2024-05-17 (×2): qty 1

## 2024-05-17 MED ORDER — 0.9 % SODIUM CHLORIDE (POUR BTL) OPTIME
TOPICAL | Status: DC | PRN
  Administered 2024-05-17: 1000 mL

## 2024-05-17 MED ORDER — ACETAMINOPHEN 650 MG RE SUPP: 650.0000 mg | Freq: Four times a day (QID) | RECTAL | Status: DC | PRN

## 2024-05-17 MED ORDER — ONDANSETRON HCL 4 MG/2ML IJ SOLN
INTRAMUSCULAR | Status: DC | PRN
  Administered 2024-05-17: 4 mg via INTRAVENOUS

## 2024-05-17 MED ORDER — LATANOPROST 0.005 % OP SOLN
1.0000 [drp] | Freq: Every day | OPHTHALMIC | Status: DC
  Administered 2024-05-17 – 2024-05-18 (×2): 1 [drp] via OPHTHALMIC
  Filled 2024-05-17: qty 2.5

## 2024-05-17 MED ORDER — MIDAZOLAM HCL 2 MG/2ML IJ SOLN
INTRAMUSCULAR | Status: AC
  Filled 2024-05-17: qty 2

## 2024-05-17 MED ORDER — SUGAMMADEX SODIUM 200 MG/2ML IV SOLN
INTRAVENOUS | Status: DC | PRN
  Administered 2024-05-17: 200 mg via INTRAVENOUS

## 2024-05-17 MED ORDER — SODIUM CHLORIDE 0.9 % IV SOLN
12.5000 mg | INTRAVENOUS | Status: DC | PRN
  Administered 2024-05-17 – 2024-05-18 (×2): 12.5 mg via INTRAVENOUS
  Filled 2024-05-17 (×2): qty 12.5

## 2024-05-17 MED ORDER — PHENYLEPHRINE HCL-NACL 20-0.9 MG/250ML-% IV SOLN
INTRAVENOUS | Status: DC | PRN
  Administered 2024-05-17: 50 ug/min via INTRAVENOUS

## 2024-05-17 MED ORDER — ONDANSETRON HCL 4 MG/2ML IJ SOLN: 4.0000 mg | Freq: Once | INTRAMUSCULAR | Status: DC | PRN

## 2024-05-17 MED ORDER — SERTRALINE HCL 100 MG PO TABS
100.0000 mg | ORAL_TABLET | Freq: Every day | ORAL | Status: DC
  Administered 2024-05-18 – 2024-05-19 (×2): 100 mg via ORAL
  Filled 2024-05-17 (×2): qty 1

## 2024-05-17 MED ORDER — BUPIVACAINE HCL (PF) 0.25 % IJ SOLN
INTRAMUSCULAR | Status: AC
  Filled 2024-05-17: qty 30

## 2024-05-17 MED ORDER — LIDOCAINE HCL 1 % IJ SOLN
INTRAMUSCULAR | Status: AC
  Filled 2024-05-17: qty 20

## 2024-05-17 MED ORDER — ONDANSETRON 4 MG PO TBDP: 4.0000 mg | ORAL_TABLET | Freq: Four times a day (QID) | ORAL | Status: DC | PRN

## 2024-05-17 MED ORDER — DEXAMETHASONE SOD PHOSPHATE PF 10 MG/ML IJ SOLN
INTRAMUSCULAR | Status: DC | PRN
  Administered 2024-05-17: 5 mg via INTRAVENOUS

## 2024-05-17 MED ORDER — FENTANYL CITRATE (PF) 250 MCG/5ML IJ SOLN
INTRAMUSCULAR | Status: DC | PRN
  Administered 2024-05-17 (×4): 50 ug via INTRAVENOUS

## 2024-05-17 MED ORDER — ACETAMINOPHEN 325 MG PO TABS: 650.0000 mg | ORAL_TABLET | Freq: Four times a day (QID) | ORAL | Status: DC | PRN

## 2024-05-17 MED ORDER — MIDAZOLAM HCL (PF) 2 MG/2ML IJ SOLN
INTRAMUSCULAR | Status: DC | PRN
  Administered 2024-05-17: 1 mg via INTRAVENOUS

## 2024-05-17 MED ORDER — ONDANSETRON HCL 4 MG/2ML IJ SOLN
INTRAMUSCULAR | Status: AC
  Filled 2024-05-17: qty 2

## 2024-05-17 MED ORDER — PROPOFOL 10 MG/ML IV BOLUS
INTRAVENOUS | Status: DC | PRN
  Administered 2024-05-17: 60 mg via INTRAVENOUS
  Administered 2024-05-17: 20 mg via INTRAVENOUS
  Administered 2024-05-17: 140 mg via INTRAVENOUS

## 2024-05-17 MED ORDER — HYDRALAZINE HCL 20 MG/ML IJ SOLN
10.0000 mg | Freq: Once | INTRAMUSCULAR | Status: AC
  Administered 2024-05-17: 10 mg via INTRAVENOUS

## 2024-05-17 MED ORDER — HYDROMORPHONE HCL 1 MG/ML IJ SOLN
1.0000 mg | INTRAMUSCULAR | Status: DC | PRN
  Administered 2024-05-17: 1 mg via INTRAVENOUS
  Filled 2024-05-17: qty 1

## 2024-05-17 MED ORDER — LACTATED RINGERS IV SOLN: INTRAVENOUS | Status: DC

## 2024-05-17 SURGICAL SUPPLY — 30 items
ATTRACTOMAT 16X20 MAGNETIC DRP (DRAPES) ×3 IMPLANT
BAG COUNTER SPONGE SURGICOUNT (BAG) ×3 IMPLANT
BLADE SURG 15 STRL LF DISP TIS (BLADE) ×3 IMPLANT
CHLORAPREP W/TINT 26 (MISCELLANEOUS) ×3 IMPLANT
CLIP TI MEDIUM 6 (CLIP) ×6 IMPLANT
CLIP TI WIDE RED SMALL 6 (CLIP) ×6 IMPLANT
COVER SURGICAL LIGHT HANDLE (MISCELLANEOUS) ×3 IMPLANT
DERMABOND ADVANCED .7 DNX12 (GAUZE/BANDAGES/DRESSINGS) ×3 IMPLANT
DRAPE LAPAROTOMY T 98X78 PEDS (DRAPES) ×3 IMPLANT
DRAPE UTILITY XL STRL (DRAPES) ×3 IMPLANT
ELECT PENCIL ROCKER SW 15FT (MISCELLANEOUS) ×3 IMPLANT
ELECT REM PT RETURN 15FT ADLT (MISCELLANEOUS) ×3 IMPLANT
GAUZE 4X4 16PLY ~~LOC~~+RFID DBL (SPONGE) ×3 IMPLANT
GLOVE SURG ORTHO 8.0 STRL STRW (GLOVE) ×3 IMPLANT
GOWN STRL REUS W/ TWL XL LVL3 (GOWN DISPOSABLE) ×9 IMPLANT
HEMOSTAT SURGICEL 2X4 FIBR (HEMOSTASIS) ×3 IMPLANT
ILLUMINATOR WAVEGUIDE N/F (MISCELLANEOUS) ×3 IMPLANT
KIT BASIN OR (CUSTOM PROCEDURE TRAY) ×3 IMPLANT
KIT TURNOVER KIT A (KITS) ×3 IMPLANT
NDL HYPO 22X1.5 SAFETY MO (MISCELLANEOUS) ×3 IMPLANT
PACK BASIC VI WITH GOWN DISP (CUSTOM PROCEDURE TRAY) ×3 IMPLANT
PAD MAGNETIC INSTR ST 16X20 (MISCELLANEOUS) ×3 IMPLANT
SHEARS HARMONIC 9CM CVD (BLADE) ×3 IMPLANT
SUT MNCRL AB 4-0 PS2 18 (SUTURE) ×3 IMPLANT
SUT SILK 3 0 SH 30 (SUTURE) ×3 IMPLANT
SUT VIC AB 3-0 SH 18 (SUTURE) ×6 IMPLANT
SYR BULB IRRIG 60ML STRL (SYRINGE) ×3 IMPLANT
SYR CONTROL 10ML LL (SYRINGE) ×3 IMPLANT
TOWEL OR 17X26 10 PK STRL BLUE (TOWEL DISPOSABLE) ×3 IMPLANT
TUBING CONNECTING 10 (TUBING) ×3 IMPLANT

## 2024-05-17 NOTE — Transfer of Care (Signed)
 Immediate Anesthesia Transfer of Care Note  Patient: Lynn Jenkins  Procedure(s) Performed: PARATHYROIDECTOMY (Neck)  Patient Location: PACU  Anesthesia Type:General  Level of Consciousness: awake  Airway & Oxygen Therapy: Patient Spontanous Breathing and Patient connected to nasal cannula oxygen  Post-op Assessment: Report given to RN and Post -op Vital signs reviewed and stable  Post vital signs: Reviewed and stable  Last Vitals:  Vitals Value Taken Time  BP 183/130 05/17/24 17:06  Temp    Pulse 71 05/17/24 17:09  Resp    SpO2 96 % 05/17/24 17:09  Vitals shown include unfiled device data.  Last Pain:  Vitals:   05/17/24 1126  TempSrc:   PainSc: 0-No pain         Complications: No notable events documented.

## 2024-05-17 NOTE — Anesthesia Procedure Notes (Signed)
 Procedure Name: Intubation Date/Time: 05/17/2024 2:48 PM  Performed by: Cena Epps, CRNAPre-anesthesia Checklist: Patient identified, Emergency Drugs available, Suction available and Patient being monitored Patient Re-evaluated:Patient Re-evaluated prior to induction Oxygen Delivery Method: Circle System Utilized Preoxygenation: Pre-oxygenation with 100% oxygen Induction Type: IV induction Ventilation: Mask ventilation without difficulty Laryngoscope Size: Mac and 3 Grade View: Grade I Tube type: Oral Tube size: 7.0 mm Number of attempts: 1 Airway Equipment and Method: Stylet and Oral airway Placement Confirmation: ETT inserted through vocal cords under direct vision, positive ETCO2 and breath sounds checked- equal and bilateral Secured at: 22 cm Tube secured with: Tape Dental Injury: Teeth and Oropharynx as per pre-operative assessment

## 2024-05-17 NOTE — Op Note (Signed)
 Operative Note  Pre-operative Diagnosis:  primary hyperparathyroidism  Post-operative Diagnosis:  same  Surgeon:  Krystal Spinner, MD  Assistant:  none   Procedure: Neck exploration with right inferior parathyroidectomy, resection of thyroid  nodule  Anesthesia: General  Estimated Blood Loss: 200 cc  Drains: None         Specimen: Thyroid  and parathyroid  tissue to pathology  Indications:  Patient returns to my practice for follow-up and to discuss surgery for persistent primary hyperparathyroidism. Patient was last evaluated in 2023. She underwent laboratory studies and imaging studies including a nuclear medicine parathyroid  scan with sestamibi. This localized a right inferior parathyroid  adenoma. Patient had undergone 2 prior neck explorations with a right sided parathyroidectomy at her initial procedure. She has had persistent primary hyperparathyroidism. Her most recent calcium  level remains elevated at 11.9. Ultrasound performed in October 2023 showed surgical absence of the left thyroid  lobe and stable nodules involving the right thyroid  lobe. Patient continues to be followed by her endocrinologist, Lynn Jenkins. She remains symptomatic with chronic fatigue, joint pain, and osteoporosis. She presents today to again discuss proceeding with neck exploration and parathyroidectomy.   Procedure:  The patient was seen in the pre-op holding area. The risks, benefits, complications, treatment options, and expected outcomes were previously discussed with the patient. The patient agreed with the proposed plan and has signed the informed consent form.  The patient was brought to the operating room by the surgical team, identified as Lynn Jenkins and the procedure verified. A time out was completed and the above information confirmed.  Following induction of general anesthesia, the patient was positioned and then prepped and draped in the usual aseptic fashion.  After ascertaining that an adequate  level of anesthesia been achieved, the previous Kocher incision is reopened with a #15 blade.  Dissection is carried through subcutaneous tissues and scar tissue using the electrocautery for hemostasis.  Dissection is carried down to the previous subplatysmal space which is then gently opened cephalad and caudad and self-retaining retractors were placed for exposure.  Strap muscles are incised in the midline.  Dissection is carried down to the airway and cricoid cartilage.  Strap muscles are reflected to the right exposing the inferior pole the right thyroid  lobe.  There is a dominant mass occupying the mid and upper portion of the right thyroid  lobe.  There appeared to be 2 smaller nodules near the inferior pole of the right lobe.  These were gently dissected away from the inferior aspect of the dominant nodule.  Vascular structures were divided between small and medium ligaclips.  Harmonic scalpel was used to divide tissue.  A branch of the inferior thyroid  artery was controlled with a medium Ligaclip.  Essentially all of the tissue below the level of the dominant nodule was resected.  This was examined and marked with sutures and submitted to pathology for frozen section.  Frozen section biopsy performed by Dr. Mark LeGolvan demonstrated both thyroid  tissue and what appeared to be hypercellular parathyroid  tissue.  Additional sections will be submitted for permanent examination.  Neck is irrigated with warm saline.  Two additional small nodules located relatively anteriorly are resected using the harmonic scalpel.  Bleeding is controlled with 3-0 Vicryl suture ligatures.  Neck is irrigated further with warm saline and good hemostasis is achieved.  Fibrillar was placed throughout the operative field.  Strap muscles are reapproximated in the midline of interrupted 3-0 Vicryl sutures.  Platysma was closed with interrupted 3-0 Vicryl sutures.  Skin is closed  with a running 4-0 Monocryl subcuticular suture.  Wound  is washed and dried and Dermabond is applied as dressing.  Patient is awakened from anesthesia and transferred to the recovery room for observation.  The patient tolerated the procedure well.   Krystal Spinner, MD [facility] Surgery Office: [phone]

## 2024-05-17 NOTE — Plan of Care (Signed)

## 2024-05-17 NOTE — Interval H&P Note (Signed)
 History and Physical Interval Note:  05/17/2024 1:57 PM  Lynn Jenkins  has presented today for surgery, with the diagnosis of PERSISTENT PRIMARY HYPERPARATHYROIDISM.  The various methods of treatment have been discussed with the patient and family. After consideration of risks, benefits and other options for treatment, the patient has consented to    Procedure(s) with comments: PARATHYROIDECTOMY (N/A) - NECK EXPLORATION WITH PARATHYROIDECTOMY THYROIDECTOMY (N/A) - POSSIBLE COMPLETION THYROIDECTOMY as a surgical intervention.    The patient's history has been reviewed, patient examined, no change in status, stable for surgery.  I have reviewed the patient's chart and labs.  Questions were answered to the patient's satisfaction.    Krystal Spinner, MD [facility] practice Office: [phone]   Krystal Spinner

## 2024-05-17 NOTE — Anesthesia Postprocedure Evaluation (Signed)
 Anesthesia Post Note  Patient: Lynn Jenkins  Procedure(s) Performed: PARATHYROIDECTOMY (Neck)     Patient location during evaluation: PACU Anesthesia Type: General Level of consciousness: awake and alert Pain management: pain level controlled Vital Signs Assessment: post-procedure vital signs reviewed and stable Respiratory status: spontaneous breathing, nonlabored ventilation, respiratory function stable and patient connected to nasal cannula oxygen Cardiovascular status: blood pressure returned to baseline and stable Postop Assessment: no apparent nausea or vomiting Anesthetic complications: no   No notable events documented.  Last Vitals:  Vitals:   05/17/24 1806 05/17/24 1852  BP: (!) 174/94 (!) 141/89  Pulse: 79 80  Resp:  16  Temp:  36.8 C  SpO2: 96% 95%    Last Pain:  Vitals:   05/17/24 1852  TempSrc: Oral  PainSc:                  [name]

## 2024-05-17 NOTE — Anesthesia Preprocedure Evaluation (Addendum)
 Anesthesia Evaluation  Patient identified by MRN, date of birth, ID band Patient awake    Reviewed: Allergy & Precautions, NPO status , Patient's Chart, lab work & pertinent test results  Airway Mallampati: III  TM Distance: >3 FB Neck ROM: Full    Dental  (+) Teeth Intact, Dental Advisory Given   Pulmonary neg pulmonary ROS   Pulmonary exam normal breath sounds clear to auscultation       Cardiovascular (-) hypertension(-) angina (-) Past MI negative cardio ROS Normal cardiovascular exam Rhythm:Regular Rate:Normal     Neuro/Psych  Headaches PSYCHIATRIC DISORDERS Anxiety        GI/Hepatic Neg liver ROS, hiatal hernia,GERD  ,,  Endo/Other  Hypothyroidism  Primary hyperparathyroidism  Lupus Obesity   Renal/GU Renal InsufficiencyRenal disease     Musculoskeletal  (+) Arthritis ,  Fibromyalgia -  Abdominal   Peds  Hematology negative hematology ROS (+)   Anesthesia Other Findings Day of surgery medications reviewed with the patient.  Reproductive/Obstetrics                              Anesthesia Physical Anesthesia Plan  ASA: 3  Anesthesia Plan: General   Post-op Pain Management: Tylenol  PO (pre-op)*   Induction: Intravenous  PONV Risk Score and Plan: 3 and Dexamethasone  and Ondansetron   Airway Management Planned: Oral ETT  Additional Equipment:   Intra-op Plan:   Post-operative Plan: Extubation in OR  Informed Consent: I have reviewed the patients History and Physical, chart, labs and discussed the procedure including the risks, benefits and alternatives for the proposed anesthesia with the patient or authorized representative who has indicated his/her understanding and acceptance.     Dental advisory given  Plan Discussed with: CRNA  Anesthesia Plan Comments:          Anesthesia Quick Evaluation

## 2024-05-18 ENCOUNTER — Encounter (HOSPITAL_COMMUNITY): Payer: Self-pay | Admitting: Surgery

## 2024-05-18 LAB — CALCIUM: Calcium: 11 mg/dL — ABNORMAL HIGH (ref 8.9–10.3)

## 2024-05-18 MED ORDER — PROCHLORPERAZINE EDISYLATE 10 MG/2ML IJ SOLN
10.0000 mg | Freq: Four times a day (QID) | INTRAMUSCULAR | Status: DC | PRN
  Administered 2024-05-18 – 2024-05-19 (×3): 10 mg via INTRAVENOUS
  Filled 2024-05-18 (×3): qty 2

## 2024-05-18 NOTE — Progress Notes (Signed)
 Patient ID: Lynn Jenkins, female   DOB: 10-Aug-1945, 78 y.o.   MRN: [id number]      Discussed with nurse twice this afternoon.  Persistent nausea and emesis despite Zofran  and Phenergan .  Will give dose of Compazine  now.  Will change to admission for another night until symptoms resolve.  Krystal Spinner, MD [facility] practice Office: [phone]

## 2024-05-18 NOTE — Plan of Care (Signed)
  Problem: Education: Goal: Knowledge of General Education information will improve Description: Including pain rating scale, medication(s)/side effects and non-pharmacologic comfort measures Outcome: Progressing   Problem: Clinical Measurements: Goal: Ability to maintain clinical measurements within normal limits will improve Outcome: Progressing   Problem: Safety: Goal: Ability to remain free from injury will improve Outcome: Progressing   

## 2024-05-18 NOTE — Plan of Care (Signed)
   Problem: Activity: Goal: Risk for activity intolerance will decrease Outcome: Progressing

## 2024-05-18 NOTE — Progress Notes (Signed)
    Assessment & Plan: POD#1 - status post neck exploration with parathyroidectomy - nauseated this AM with emesis - Rx with Phenergan  now - calcium  improved at 11.0 this AM - will plan discharge this afternoon once nausea resolves  Krystal Spinner, MD [facility] practice Office: [phone]         Krystal Spinner, MD [facility] practice Office: [phone]        Chief Complaint: Primary hyperparathyroidism  Subjective: Patient in bed, nauseated, emesis this AM.  Objective: Vital signs in last 24 hours: Temp:  [97 F (36.1 C)-98.3 F (36.8 C)] 97.8 F (36.6 C) (12/09 0613) Pulse Rate:  [63-80] 63 (12/09 0613) Resp:  [15-20] 16 (12/09 9386) BP: (96-198)/(69-130) 127/69 (12/09 0613) SpO2:  [92 %-98 %] 93 % (12/09 9386) Weight:  [90.7 kg] 90.7 kg (12/08 1115) Last BM Date : 05/16/24  Intake/Output from previous day: 12/08 0701 - 12/09 0700 In: 1900.1 [P.O.:240; I.V.:1410.1; IV Piggyback:250] Out: 1000 [Urine:800; Blood:200] Intake/Output this shift: No intake/output data recorded.  Physical Exam: HEENT - sclerae clear, mucous membranes moist Neck - wound dry and intact; mild STS; mild ecchymosis; voice raspy but relatively full  Lab Results:  No results for input(s): WBC, HGB, HCT, PLT in the last 72 hours. BMET Recent Labs    05/18/24 0455  CALCIUM  11.0*   PT/INR No results for input(s): LABPROT, INR in the last 72 hours. Comprehensive Metabolic Panel:    Component Value Date/Time   NA 141 05/05/2024 1505   NA 139 11/22/2023 0446   K 4.5 05/05/2024 1505   K 4.6 11/22/2023 0446   CL 107 05/05/2024 1505   CL 108 11/22/2023 0446   CO2 26 05/05/2024 1505   CO2 23 11/22/2023 0446   BUN 22 05/05/2024 1505   BUN 23 11/22/2023 0446   CREATININE 1.08 (H) 05/05/2024 1505   CREATININE 1.20 (H) 11/22/2023 0458   CREATININE 1.13 (H) 08/20/2019 1226   GLUCOSE 104 (H) 05/05/2024 1505   GLUCOSE 106  (H) 11/22/2023 0446   CALCIUM  11.0 (H) 05/18/2024 0455   CALCIUM  12.6 (H) 05/05/2024 1505   CALCIUM  10.6 (H) 03/13/2022 1039   AST 18 11/22/2023 0446   AST 17 08/16/2022 0617   AST 13 (L) 08/20/2019 1226   ALT 13 11/22/2023 0446   ALT 15 08/16/2022 0617   ALT 13 08/20/2019 1226   ALKPHOS 100 11/22/2023 0446   ALKPHOS 93 08/16/2022 0617   BILITOT 0.5 11/22/2023 0446   BILITOT 0.5 08/16/2022 0617   BILITOT 0.4 08/20/2019 1226   PROT 6.8 11/22/2023 0446   PROT 6.5 08/16/2022 0617   ALBUMIN 3.4 (L) 11/22/2023 0446   ALBUMIN 3.3 (L) 08/16/2022 0617    Studies/Results: No results found.    Krystal Spinner 05/18/2024  Patient ID: Lynn Jenkins, female   DOB: 1945-08-23, 78 y.o.   MRN: [id number]

## 2024-05-18 NOTE — Progress Notes (Signed)
   05/18/24 1023  TOC Brief Assessment  Insurance and Status Reviewed  Patient has primary care physician Yes  Home environment has been reviewed resides in a private residence  Prior level of function: Independent  Prior/Current Home Services No current home services  Social Drivers of Health Review SDOH reviewed no interventions necessary  Readmission risk has been reviewed Yes  Transition of care needs no transition of care needs at this time

## 2024-05-18 NOTE — Discharge Instructions (Signed)

## 2024-05-19 DIAGNOSIS — E210 Primary hyperparathyroidism: Secondary | ICD-10-CM | POA: Diagnosis not present

## 2024-05-19 LAB — SURGICAL PATHOLOGY

## 2024-05-19 NOTE — Discharge Summary (Signed)
 Physician Discharge Summary   Patient ID: Lynn Jenkins MRN: [id number] DOB/AGE: 77-Jan-1947 77 y.o.  Admit date: 05/17/2024  Discharge date: 05/19/2024  Discharge Diagnoses:  Principal Problem:   Hyperparathyroidism, primary Active Problems:   Hypercalcemia   Primary hyperparathyroidism   Discharged Condition: good  Hospital Course: Patient was admitted for observation following neck exploration and parathyroidectomy.  Post op course was complicated by persistent nausea and emesis for greater than 24 hours following anesthesia.  Pain was well controlled.  Tolerated diet on the second post op day.  Post op calcium  level on morning following surgery was 11.0 mg/dl, down from 87.3.SABRA  Patient was prepared for discharge home on POD#2.  Consults: None  Treatments: surgery: neck exploration and parathyroidectomy  Discharge Exam: Blood pressure (!) 152/83, pulse 70, temperature 98.8 F (37.1 C), temperature source Oral, resp. rate 18, height 5' 4 (1.626 m), weight 90.7 kg, SpO2 90%. HEENT - clear Neck - wound dry and intact; moderate ecchymosis; mild STS  Disposition: Home  Discharge Instructions     Diet - low sodium heart healthy   Complete by: As directed    Increase activity slowly   Complete by: As directed    No dressing needed   Complete by: As directed       Allergies as of 05/19/2024       Reactions   Doxycycline Itching, Swelling, Other (See Comments)   Facial swelling, itching   Morphine And Codeine Swelling   Zomig [zolmitriptan] Nausea And Vomiting   Increased heart rate    Augmentin  [amoxicillin -pot Clavulanate] Diarrhea   Ceftin [cefuroxime Axetil] Other (See Comments)   Stomach ache   Gabapentin Other (See Comments)   Confusion, off balance   Ferrous Sulfate  Diarrhea   Methazolamide Diarrhea   Biaxin [clarithromycin] Rash   Cefdinir Rash   Sumatriptan  Other (See Comments)   Can use brand Imitrex         Medication List     TAKE these  medications    acetaminophen  500 MG tablet Commonly known as: TYLENOL  Take 500-1,000 mg by mouth every 6 (six) hours as needed for mild pain or moderate pain.   ciclopirox  8 % solution Commonly known as: PENLAC  Apply topically at bedtime. Apply over nail and surrounding skin. Apply daily over previous coat. After seven (7) days, may remove with alcohol and continue cycle.   furosemide  20 MG tablet Commonly known as: LASIX  Take 20 mg by mouth daily as needed for fluid or edema.   levothyroxine  88 MCG tablet Commonly known as: SYNTHROID  Take 88 mcg by mouth daily before breakfast.   loperamide 2 MG tablet Commonly known as: IMODIUM A-D Take 2 mg by mouth 4 (four) times daily as needed for diarrhea or loose stools.   methocarbamol  500 MG tablet Commonly known as: ROBAXIN  Take 500 mg by mouth 2 (two) times daily as needed for muscle spasms.   metoCLOPramide  10 MG tablet Commonly known as: REGLAN  Take 1 tablet (10 mg total) by mouth every 6 (six) hours as needed for nausea or vomiting. What changed:  how much to take when to take this additional instructions   multivitamin with minerals Tabs tablet Take 1 tablet by mouth daily.   nitroGLYCERIN  0.4 MG SL tablet Commonly known as: NITROSTAT  Place 1 tablet (0.4 mg total) under the tongue every 5 (five) minutes as needed for chest pain. Max 3 tablets per event.   ondansetron  4 MG disintegrating tablet Commonly known as: ZOFRAN -ODT 4mg  ODT q4 hours  prn nausea/vomit   Refresh Plus 0.5 % Soln Generic drug: Carboxymethylcellulose Sod PF Place 1 drop into both eyes 2 (two) times daily.   rosuvastatin  20 MG tablet Commonly known as: CRESTOR  Take 1 tablet (20 mg total) by mouth daily.   scopolamine  1 MG/3DAYS Commonly known as: TRANSDERM-SCOP Place 1 patch (1.5 mg total) onto the skin every 3 (three) days.   sertraline  100 MG tablet Commonly known as: ZOLOFT  Take 100 mg by mouth daily.   timolol  0.5 % ophthalmic  solution Commonly known as: TIMOPTIC  Place 1 drop into the right eye 2 (two) times daily.   Xalatan  0.005 % ophthalmic solution Generic drug: latanoprost  Place 1 drop into the right eye at bedtime.               Discharge Care Instructions  (From admission, onward)           Start     Ordered   05/19/24 0000  No dressing needed        05/19/24 1135            Follow-up Information     Eletha Boas, MD. Schedule an appointment as soon as possible for a visit in 3 week(s).   Specialty: General Surgery Why: For wound re-check Contact information: [address] [place] [address] [phone]                 Boas Eletha, MD [facility] Office: [phone]   Signed: Boas Eletha 05/19/2024, 11:35 AM

## 2024-05-23 ENCOUNTER — Other Ambulatory Visit: Payer: Self-pay

## 2024-05-23 ENCOUNTER — Inpatient Hospital Stay (HOSPITAL_COMMUNITY)
Admission: EM | Admit: 2024-05-23 | Discharge: 2024-05-31 | DRG: 641 | Disposition: A | Discharge status: Home / Self Care | Attending: Internal Medicine | Admitting: Internal Medicine

## 2024-05-23 ENCOUNTER — Encounter (HOSPITAL_COMMUNITY): Payer: Self-pay | Admitting: Internal Medicine

## 2024-05-23 DIAGNOSIS — E039 Hypothyroidism, unspecified: Secondary | ICD-10-CM | POA: Diagnosis present

## 2024-05-23 DIAGNOSIS — N183 Chronic kidney disease, stage 3 unspecified: Secondary | ICD-10-CM | POA: Diagnosis present

## 2024-05-23 DIAGNOSIS — Z83438 Family history of other disorder of lipoprotein metabolism and other lipidemia: Secondary | ICD-10-CM

## 2024-05-23 DIAGNOSIS — E042 Nontoxic multinodular goiter: Secondary | ICD-10-CM | POA: Insufficient documentation

## 2024-05-23 DIAGNOSIS — M179 Osteoarthritis of knee, unspecified: Secondary | ICD-10-CM | POA: Insufficient documentation

## 2024-05-23 DIAGNOSIS — H409 Unspecified glaucoma: Secondary | ICD-10-CM | POA: Diagnosis present

## 2024-05-23 DIAGNOSIS — K219 Gastro-esophageal reflux disease without esophagitis: Secondary | ICD-10-CM | POA: Diagnosis present

## 2024-05-23 DIAGNOSIS — R197 Diarrhea, unspecified: Secondary | ICD-10-CM | POA: Diagnosis not present

## 2024-05-23 DIAGNOSIS — R2689 Other abnormalities of gait and mobility: Secondary | ICD-10-CM | POA: Insufficient documentation

## 2024-05-23 DIAGNOSIS — E782 Mixed hyperlipidemia: Secondary | ICD-10-CM | POA: Diagnosis present

## 2024-05-23 DIAGNOSIS — M549 Dorsalgia, unspecified: Secondary | ICD-10-CM | POA: Diagnosis present

## 2024-05-23 DIAGNOSIS — E8809 Other disorders of plasma-protein metabolism, not elsewhere classified: Secondary | ICD-10-CM | POA: Diagnosis present

## 2024-05-23 DIAGNOSIS — R531 Weakness: Secondary | ICD-10-CM | POA: Diagnosis not present

## 2024-05-23 DIAGNOSIS — Z881 Allergy status to other antibiotic agents status: Secondary | ICD-10-CM

## 2024-05-23 DIAGNOSIS — Z885 Allergy status to narcotic agent status: Secondary | ICD-10-CM

## 2024-05-23 DIAGNOSIS — Z79899 Other long term (current) drug therapy: Secondary | ICD-10-CM

## 2024-05-23 DIAGNOSIS — M1710 Unilateral primary osteoarthritis, unspecified knee: Secondary | ICD-10-CM

## 2024-05-23 DIAGNOSIS — Z79890 Hormone replacement therapy (postmenopausal): Secondary | ICD-10-CM

## 2024-05-23 DIAGNOSIS — F419 Anxiety disorder, unspecified: Secondary | ICD-10-CM | POA: Diagnosis present

## 2024-05-23 DIAGNOSIS — E669 Obesity, unspecified: Secondary | ICD-10-CM | POA: Diagnosis present

## 2024-05-23 DIAGNOSIS — H401123 Primary open-angle glaucoma, left eye, severe stage: Secondary | ICD-10-CM | POA: Diagnosis present

## 2024-05-23 DIAGNOSIS — R252 Cramp and spasm: Secondary | ICD-10-CM | POA: Diagnosis present

## 2024-05-23 DIAGNOSIS — Z90710 Acquired absence of both cervix and uterus: Secondary | ICD-10-CM

## 2024-05-23 DIAGNOSIS — Z6834 Body mass index (BMI) 34.0-34.9, adult: Secondary | ICD-10-CM

## 2024-05-23 DIAGNOSIS — M8580 Other specified disorders of bone density and structure, unspecified site: Secondary | ICD-10-CM | POA: Insufficient documentation

## 2024-05-23 DIAGNOSIS — M7918 Myalgia, other site: Secondary | ICD-10-CM | POA: Diagnosis present

## 2024-05-23 DIAGNOSIS — R609 Edema, unspecified: Secondary | ICD-10-CM | POA: Insufficient documentation

## 2024-05-23 DIAGNOSIS — E210 Primary hyperparathyroidism: Secondary | ICD-10-CM | POA: Diagnosis present

## 2024-05-23 DIAGNOSIS — K920 Hematemesis: Secondary | ICD-10-CM | POA: Insufficient documentation

## 2024-05-23 DIAGNOSIS — M899 Disorder of bone, unspecified: Secondary | ICD-10-CM

## 2024-05-23 DIAGNOSIS — I700 Atherosclerosis of aorta: Secondary | ICD-10-CM | POA: Insufficient documentation

## 2024-05-23 DIAGNOSIS — K3184 Gastroparesis: Secondary | ICD-10-CM | POA: Insufficient documentation

## 2024-05-23 DIAGNOSIS — Z888 Allergy status to other drugs, medicaments and biological substances status: Secondary | ICD-10-CM

## 2024-05-23 DIAGNOSIS — E559 Vitamin D deficiency, unspecified: Secondary | ICD-10-CM | POA: Diagnosis present

## 2024-05-23 DIAGNOSIS — B379 Candidiasis, unspecified: Secondary | ICD-10-CM | POA: Insufficient documentation

## 2024-05-23 DIAGNOSIS — E041 Nontoxic single thyroid nodule: Secondary | ICD-10-CM | POA: Diagnosis present

## 2024-05-23 DIAGNOSIS — R7989 Other specified abnormal findings of blood chemistry: Secondary | ICD-10-CM | POA: Diagnosis present

## 2024-05-23 DIAGNOSIS — R5382 Chronic fatigue, unspecified: Secondary | ICD-10-CM | POA: Diagnosis present

## 2024-05-23 DIAGNOSIS — M810 Age-related osteoporosis without current pathological fracture: Secondary | ICD-10-CM | POA: Diagnosis present

## 2024-05-23 DIAGNOSIS — E876 Hypokalemia: Secondary | ICD-10-CM | POA: Diagnosis present

## 2024-05-23 DIAGNOSIS — Z9049 Acquired absence of other specified parts of digestive tract: Secondary | ICD-10-CM

## 2024-05-23 DIAGNOSIS — I872 Venous insufficiency (chronic) (peripheral): Secondary | ICD-10-CM | POA: Insufficient documentation

## 2024-05-23 DIAGNOSIS — N1831 Chronic kidney disease, stage 3a: Secondary | ICD-10-CM | POA: Diagnosis present

## 2024-05-23 DIAGNOSIS — Z8249 Family history of ischemic heart disease and other diseases of the circulatory system: Secondary | ICD-10-CM

## 2024-05-23 DIAGNOSIS — E162 Hypoglycemia, unspecified: Secondary | ICD-10-CM | POA: Diagnosis not present

## 2024-05-23 LAB — COMPREHENSIVE METABOLIC PANEL WITH GFR
ALT: 20 U/L (ref 0–44)
AST: 27 U/L (ref 15–41)
Albumin: 3.9 g/dL (ref 3.5–5.0)
Alkaline Phosphatase: 163 U/L — ABNORMAL HIGH (ref 38–126)
Anion gap: 15 (ref 5–15)
BUN: 21 mg/dL (ref 8–23)
CO2: 24 mmol/L (ref 22–32)
Calcium: 7.5 mg/dL — ABNORMAL LOW (ref 8.9–10.3)
Chloride: 101 mmol/L (ref 98–111)
Creatinine, Ser: 1.24 mg/dL — ABNORMAL HIGH (ref 0.44–1.00)
GFR, Estimated: 45 mL/min — ABNORMAL LOW (ref >60)
Glucose, Bld: 141 mg/dL — ABNORMAL HIGH (ref 70–99)
Potassium: 3.4 mmol/L — ABNORMAL LOW (ref 3.5–5.1)
Sodium: 141 mmol/L (ref 135–145)
Total Bilirubin: 1.5 mg/dL — ABNORMAL HIGH (ref 0.0–1.2)
Total Protein: 7.8 g/dL (ref 6.5–8.1)

## 2024-05-23 LAB — URINALYSIS, ROUTINE W REFLEX MICROSCOPIC
Bilirubin Urine: NEGATIVE
Glucose, UA: NEGATIVE mg/dL
Ketones, ur: NEGATIVE mg/dL
Leukocytes,Ua: NEGATIVE
Nitrite: NEGATIVE
Protein, ur: 30 mg/dL — AB
Specific Gravity, Urine: 1.009 (ref 1.005–1.030)
pH: 6 (ref 5.0–8.0)

## 2024-05-23 LAB — CBC
HCT: 38.7 % (ref 36.0–46.0)
Hemoglobin: 12.5 g/dL (ref 12.0–15.0)
MCH: 28.5 pg (ref 26.0–34.0)
MCHC: 32.3 g/dL (ref 30.0–36.0)
MCV: 88.2 fL (ref 80.0–100.0)
Platelets: 189 K/uL (ref 150–400)
RBC: 4.39 MIL/uL (ref 3.87–5.11)
RDW: 15.4 % (ref 11.5–15.5)
WBC: 13.3 K/uL — ABNORMAL HIGH (ref 4.0–10.5)
nRBC: 0 % (ref 0.0–0.2)

## 2024-05-23 LAB — CALCIUM
Calcium: 7.5 mg/dL — ABNORMAL LOW (ref 8.9–10.3)
Calcium: 7.6 mg/dL — ABNORMAL LOW (ref 8.9–10.3)

## 2024-05-23 LAB — CBG MONITORING, ED: Glucose-Capillary: 126 mg/dL — ABNORMAL HIGH (ref 70–99)

## 2024-05-23 LAB — MAGNESIUM: Magnesium: 1.6 mg/dL — ABNORMAL LOW (ref 1.7–2.4)

## 2024-05-23 LAB — PHOSPHORUS: Phosphorus: 2.5 mg/dL (ref 2.5–4.6)

## 2024-05-23 MED ORDER — ONDANSETRON HCL 4 MG/2ML IJ SOLN: 4.0000 mg | Freq: Four times a day (QID) | INTRAMUSCULAR | Status: DC | PRN

## 2024-05-23 MED ORDER — LOPERAMIDE HCL 2 MG PO CAPS
2.0000 mg | ORAL_CAPSULE | Freq: Four times a day (QID) | ORAL | Status: DC | PRN
  Administered 2024-05-23 – 2024-05-31 (×5): 2 mg via ORAL
  Filled 2024-05-23 (×5): qty 1

## 2024-05-23 MED ORDER — SODIUM CHLORIDE 0.9 % IV SOLN: INTRAVENOUS | Status: DC

## 2024-05-23 MED ORDER — SERTRALINE HCL 100 MG PO TABS
100.0000 mg | ORAL_TABLET | Freq: Every day | ORAL | Status: DC
  Administered 2024-05-24 – 2024-05-31 (×8): 100 mg via ORAL
  Filled 2024-05-23 (×8): qty 1

## 2024-05-23 MED ORDER — MAGNESIUM SULFATE 2 GM/50ML IV SOLN
2.0000 g | Freq: Once | INTRAVENOUS | Status: AC
  Administered 2024-05-23: 2 g via INTRAVENOUS
  Filled 2024-05-23: qty 50

## 2024-05-23 MED ORDER — CALCIUM CARBONATE ANTACID 500 MG PO CHEW
400.0000 mg | CHEWABLE_TABLET | Freq: Three times a day (TID) | ORAL | Status: DC
  Administered 2024-05-23 – 2024-05-26 (×8): 400 mg via ORAL
  Filled 2024-05-23 (×8): qty 2

## 2024-05-23 MED ORDER — ROSUVASTATIN CALCIUM 10 MG PO TABS
20.0000 mg | ORAL_TABLET | Freq: Every day | ORAL | Status: DC
  Administered 2024-05-23 – 2024-05-30 (×8): 20 mg via ORAL
  Filled 2024-05-23 (×8): qty 2

## 2024-05-23 MED ORDER — ACETAMINOPHEN 650 MG RE SUPP: 650.0000 mg | Freq: Four times a day (QID) | RECTAL | Status: AC | PRN

## 2024-05-23 MED ORDER — MELATONIN 5 MG PO TABS
5.0000 mg | ORAL_TABLET | Freq: Every evening | ORAL | Status: AC | PRN
  Administered 2024-05-23 – 2024-05-25 (×3): 5 mg via ORAL
  Filled 2024-05-23 (×3): qty 1

## 2024-05-23 MED ORDER — VITAMIN D (ERGOCALCIFEROL) 1.25 MG (50000 UNIT) PO CAPS
50000.0000 [IU] | ORAL_CAPSULE | Freq: Once | ORAL | Status: AC
  Administered 2024-05-23: 50000 [IU] via ORAL
  Filled 2024-05-23: qty 1

## 2024-05-23 MED ORDER — CYCLOBENZAPRINE HCL 10 MG PO TABS
5.0000 mg | ORAL_TABLET | Freq: Once | ORAL | Status: AC
  Administered 2024-05-23: 5 mg via ORAL
  Filled 2024-05-23: qty 1

## 2024-05-23 MED ORDER — CALCIUM GLUCONATE-NACL 1-0.675 GM/50ML-% IV SOLN
1.0000 g | Freq: Once | INTRAVENOUS | Status: AC
  Administered 2024-05-23: 1000 mg via INTRAVENOUS
  Filled 2024-05-23: qty 50

## 2024-05-23 MED ORDER — POLYVINYL ALCOHOL 1.4 % OP SOLN
1.0000 [drp] | Freq: Two times a day (BID) | OPHTHALMIC | Status: DC
  Administered 2024-05-23 – 2024-05-31 (×16): 1 [drp] via OPHTHALMIC
  Filled 2024-05-23: qty 15

## 2024-05-23 MED ORDER — CALCIUM GLUCONATE-NACL 1-0.675 GM/50ML-% IV SOLN
1.0000 g | INTRAVENOUS | Status: AC
  Administered 2024-05-23 (×2): 1000 mg via INTRAVENOUS
  Filled 2024-05-23 (×2): qty 50

## 2024-05-23 MED ORDER — ONDANSETRON HCL 4 MG PO TABS: 4.0000 mg | ORAL_TABLET | Freq: Four times a day (QID) | ORAL | Status: DC | PRN

## 2024-05-23 MED ORDER — POTASSIUM CHLORIDE CRYS ER 20 MEQ PO TBCR
40.0000 meq | EXTENDED_RELEASE_TABLET | Freq: Once | ORAL | Status: AC
  Administered 2024-05-23: 40 meq via ORAL
  Filled 2024-05-23: qty 2

## 2024-05-23 MED ORDER — LATANOPROST 0.005 % OP SOLN
1.0000 [drp] | Freq: Every day | OPHTHALMIC | Status: DC
  Administered 2024-05-23 – 2024-05-30 (×8): 1 [drp] via OPHTHALMIC
  Filled 2024-05-23: qty 2.5

## 2024-05-23 MED ORDER — ACETAMINOPHEN 325 MG PO TABS
650.0000 mg | ORAL_TABLET | Freq: Four times a day (QID) | ORAL | Status: AC | PRN
  Administered 2024-05-23: 650 mg via ORAL
  Administered 2024-05-23 (×2): 325 mg via ORAL
  Administered 2024-05-24 – 2024-05-27 (×6): 650 mg via ORAL
  Filled 2024-05-23 (×9): qty 2

## 2024-05-23 MED ORDER — TIMOLOL MALEATE 0.5 % OP SOLN
1.0000 [drp] | Freq: Two times a day (BID) | OPHTHALMIC | Status: DC
  Administered 2024-05-23 – 2024-05-31 (×16): 1 [drp] via OPHTHALMIC
  Filled 2024-05-23: qty 5

## 2024-05-23 MED ORDER — METOCLOPRAMIDE HCL 10 MG PO TABS: 10.0000 mg | ORAL_TABLET | Freq: Four times a day (QID) | ORAL | Status: DC | PRN

## 2024-05-23 MED ORDER — LEVOTHYROXINE SODIUM 88 MCG PO TABS
88.0000 ug | ORAL_TABLET | Freq: Every day | ORAL | Status: DC
  Administered 2024-05-24 – 2024-05-31 (×8): 88 ug via ORAL
  Filled 2024-05-23 (×8): qty 1

## 2024-05-23 NOTE — ED Triage Notes (Signed)
 Pt bib ems due having Thyroid  procedure Monday - post has c/o of being light headed, diarrhea, numbness in face legs and hands, incot., weak -ems ortho statics WNL

## 2024-05-23 NOTE — H&P (Signed)
 History and Physical    Patient: Lynn Jenkins FMW:[id number] DOB: 12/21/1945 DOA: 05/23/2024 DOS: the patient was seen and examined on 05/23/2024 PCP: Okey Carlin Redbird, MD  Patient coming from: Home  Chief Complaint:  Chief Complaint  Patient presents with   Weakness   HPI: Lynn Jenkins is a 78 y.o. female with medical history significant of normocytic anemia, osteoarthritis, CKD, general anxiety disorder, GERD, hiatal hernia, glaucoma, migraine headache, hypothyroidism, unspecified lupus, mixed hyperlipidemia, multinodular goiter, osteopenia, history of pneumonia, varicose vein, vitamin D  deficiency, primary hyperparathyroidism who underwent parathyroidectomy 6 days ago with Dr. Orlando and is coming to the emergency department due to generalized weakness, dyspnea, diarrhea, facial, hand and lower extremity numbness sensation. He denied fever, chills, rhinorrhea, sore throat, wheezing or hemoptysis.  No chest pain, palpitations, diaphoresis, PND, orthopnea or pitting edema of the lower extremities.  No abdominal pain, nausea, emesis, diarrhea, constipation, melena or hematochezia.  No flank pain, dysuria, frequency or hematuria.  No polyuria, polydipsia, polyphagia or blurred vision.   ED course: Initial vital signs were temperature 98.3 F, pulse 76, respiration 18, BP 145/88 mmHg and O2 sat 95% on room air.  The patient received calcium  gluconate 1 g IVPB, cyclobenzaprine  5 mg p.o. and KCl 40 mEq p.o. x 1 dose.  I added magnesium  sulfate 2 g IVPB.  Lab work: Urinalysis was hazy with small hemoglobin, protein 30 mg deciliter and a few bacteria on microscopic examination.  CBC showed a white count of 13.3, hemoglobin 12.5 g/dL and platelets 810.  Magnesium  was 1.6 mg/dL.  CMP showed potassium 3.4 mmol/L, glucose 141, BUN 21, creatinine 1.24, calcium  7.5 and total bilirubin 1.5 mg/dL.  Alkaline phosphatase was 163 units/L.  The rest of the electrolytes, protein levels and both transaminases  are normal.    Review of Systems: As mentioned in the history of present illness. All other systems reviewed and are negative. Past Medical History:  Diagnosis Date   Anemia    Arthritis    Chronic kidney disease    Generalized anxiety disorder    GERD (gastroesophageal reflux disease)    Glaucoma    Headache    hx of migraines    History of hiatal hernia    Hyperthyroidism    Lupus    Migraine    Mixed hyperlipidemia    Multinodular goiter    Osteopenia    Pneumonia    Primary hyperparathyroidism    Varicose veins    Vitamin D  deficiency    Past Surgical History:  Procedure Laterality Date   ABDOMINAL HYSTERECTOMY     APPENDECTOMY     CHOLECYSTECTOMY N/A 03/11/2022   Procedure: LAPAROSCOPIC CHOLECYSTECTOMY WITH icg dye;  Surgeon: Signe Mitzie LABOR, MD;  Location: [facility];  Service: General;  Laterality: N/A;   COLONOSCOPY N/A 04/06/2024   Procedure: COLONOSCOPY;  Surgeon: Dianna Specking, MD;  Location: [facility];  Service: Gastroenterology;  Laterality: N/A;   EYE SURGERY     FRACTURE SURGERY Right 2023   knee   INSERTION OF MESH  10/15/2022   Procedure: INSERTION OF MESH;  Surgeon: Leos Calamity, MD;  Location: [facility] OR;  Service: General;;   left foot surgery      PARATHYROIDECTOMY N/A 11/01/2014   Procedure: NECK EXPLORATION AND PARATHYROIDECTOMY;  Surgeon: Krystal Spinner, MD;  Location: [facility];  Service: General;  Laterality: N/A;   PARATHYROIDECTOMY N/A 10/31/2020   Procedure: PARATHYROIDECTOMY WITH LEFT THYROID  LOBECTOMY;  Surgeon: Spinner Krystal, MD;  Location: [facility];  Service: General;  Laterality: N/A;   PARATHYROIDECTOMY N/A 05/17/2024   Procedure: PARATHYROIDECTOMY;  Surgeon: Eletha Boas, MD;  Location: [facility];  Service: General;  Laterality: N/A;  NECK EXPLORATION WITH PARATHYROIDECTOMY   SHOULDER SURGERY     XI ROBOTIC ASSISTED HIATAL HERNIA REPAIR N/A 10/15/2022   Procedure: XI ROBOTIC ASSISTED HIATAL HERNIA REPAIR WITH FUNDOPLICATION;  Surgeon: Rubin Calamity, MD;  Location: [facility] OR;  Service: General;  Laterality: N/A;   Social History:  reports that she has never smoked. She has never used smokeless tobacco. She reports that she does not drink alcohol  and does not use drugs.  Allergies[1]  Family History  Problem Relation Age of Onset   Heart disease Mother    Hyperlipidemia Mother    Hypertension Mother    Heart disease Father    Hyperlipidemia Father    Hypertension Father    Cancer Brother    Hypertension Brother     Prior to Admission medications  Medication Sig Start Date End Date Taking? Authorizing Provider  acetaminophen  (TYLENOL ) 500 MG tablet Take 500-1,000 mg by mouth every 6 (six) hours as needed for mild pain or moderate pain.    [provider]  ciclopirox  (PENLAC ) 8 % solution Apply topically at bedtime. Apply over nail and surrounding skin. Apply daily over previous coat. After seven (7) days, may remove with alcohol  and continue cycle. 09/01/23   Gershon Donnice SAUNDERS, DPM  furosemide  (LASIX ) 20 MG tablet Take 20 mg by mouth daily as needed for fluid or edema. 08/26/18   [provider]  levothyroxine  (SYNTHROID ) 88 MCG tablet Take 88 mcg by mouth daily before breakfast. 03/06/22   [provider]  loperamide  (IMODIUM  A-D) 2 MG tablet Take 2 mg by mouth 4 (four) times daily as needed for diarrhea or loose stools.    [provider]  methocarbamol (ROBAXIN) 500 MG tablet Take 500 mg by mouth 2 (two) times daily as needed for muscle spasms. 11/28/23   [provider]  metoCLOPramide  (REGLAN ) 10 MG tablet Take 1 tablet (10 mg total) by mouth every 6 (six) hours as needed for nausea or vomiting. Patient taking differently: Take 5-10 mg by mouth See admin instructions. Take one tablet by mouth up to 4 times a day 11/14/19   Towana Ozell BROCKS, MD  Multiple Vitamin (MULTIVITAMIN WITH MINERALS) TABS tablet Take 1 tablet by mouth daily.    [provider]  nitroGLYCERIN  (NITROSTAT )  0.4 MG SL tablet Place 1 tablet (0.4 mg total) under the tongue every 5 (five) minutes as needed for chest pain. Max 3 tablets per event. 12/31/23   Patwardhan, Newman PARAS, MD  ondansetron  (ZOFRAN -ODT) 4 MG disintegrating tablet 4mg  ODT q4 hours prn nausea/vomit 08/14/22   Patt Alm Macho, MD  REFRESH PLUS 0.5 % SOLN Place 1 drop into both eyes 2 (two) times daily.    [provider]  rosuvastatin  (CRESTOR ) 20 MG tablet Take 1 tablet (20 mg total) by mouth daily. 12/31/23   Patwardhan, Newman PARAS, MD  scopolamine  (TRANSDERM-SCOP) 1 MG/3DAYS Place 1 patch (1.5 mg total) onto the skin every 3 (three) days. Patient not taking: Reported on 04/29/2024 08/17/22   Raenelle Coria, MD  sertraline  (ZOLOFT ) 100 MG tablet Take 100 mg by mouth daily.    [provider]  timolol  (TIMOPTIC ) 0.5 % ophthalmic solution Place 1 drop into the right eye 2 (two) times daily.    [provider]  XALATAN  0.005 % ophthalmic solution Place 1 drop into the right  eye at bedtime. 03/18/15   [provider]    Physical Exam: Vitals:   05/23/24 1039 05/23/24 1300  BP: (!) 145/88 (!) 162/91  Pulse: 76 71  Resp: 18 16  Temp: 98.3 F (36.8 C)   TempSrc: Oral   SpO2: 95% 94%   Physical Exam Vitals reviewed.  Constitutional:      General: She is awake. She is not in acute distress.    Appearance: She is ill-appearing.  HENT:     Head: Normocephalic.     Nose: No rhinorrhea.     Mouth/Throat:     Mouth: Mucous membranes are moist.  Eyes:     General: No scleral icterus.    Pupils: Pupils are equal, round, and reactive to light.  Neck:     Vascular: No JVD.  Cardiovascular:     Rate and Rhythm: Normal rate and regular rhythm.     Heart sounds: S1 normal and S2 normal.  Pulmonary:     Effort: Pulmonary effort is normal.     Breath sounds: Normal breath sounds. No wheezing, rhonchi or rales.  Abdominal:     General: Bowel sounds are normal. There is no distension.     Palpations:  Abdomen is soft.     Tenderness: There is no abdominal tenderness. There is no right CVA tenderness or left CVA tenderness.  Musculoskeletal:     Cervical back: Neck supple.     Right lower leg: No edema.     Left lower leg: No edema.  Skin:    General: Skin is warm and dry.  Neurological:     General: No focal deficit present.     Mental Status: She is alert and oriented to person, place, and time.  Psychiatric:        Mood and Affect: Mood normal.        Behavior: Behavior normal. Behavior is cooperative.     Data Reviewed:  Results are pending, will review when available. 02/06/2024 echocardiogram report. IMPRESSIONS:   1. Prtoximal septal thickening . Left ventricular ejection fraction, by  estimation, is 60 to 65%. The left ventricle has normal function. The left  ventricle has no regional wall motion abnormalities. There is mild left  ventricular hypertrophy. Left  ventricular diastolic parameters are indeterminate. The average left  ventricular global longitudinal strain is -20.2 %.   2. Right ventricular systolic function is normal. The right ventricular  size is normal.   3. The mitral valve is normal in structure. Mild mitral valve  regurgitation.   4. The aortic valve is tricuspid. Aortic valve regurgitation is mild.  Aortic valve sclerosis/calcification is present, without any evidence of  aortic stenosis.   EKG: Vent. rate 70 BPM PR interval 139 ms QRS duration 136 ms QT/QTcB 470/508 ms P-R-T axes 38 5 13 Sinus rhythm Right bundle branch bloc  Assessment and Plan: Principal Problem:   Hyperparathyroidism, primary Status post parathyroidectomy related:   Hypocalcemia Associated with:   Hypokalemia   Hypomagnesemia Observation/telemetry. Check calcium  every 6 hours. IV calcium  gluconate as needed. Begin calcium  carbonate p.o. 3 times daily. Vitamin D3 50,000 units p.o. x 1 dose now. Magnesium  sulfate 2 g IVPB. KCl 40 mEq p.o. x 1 dose. Follow-up  electrolytes in AM.  Active Problems: Vitamin D  deficiency.   Vitamin D  50,000 units p.o. x 1 dose now.    Abnormal LFTs Recheck LFTs in AM.    Obesity (BMI 30-39.9) Current BMI 34.25 kg/m. Would benefit from lifestyle modifications.  Follow-up closely with PCP and/or bariatric clinic.    Hypothyroidism Continue levothyroxine  88 mcg p.o. daily.    Primary open angle glaucoma (POAG) of left eye, severe stage Continue timolol  and Xalatan  drops. Follow-up with ophthalmology as an outpatient.    Mixed hyperlipidemia Continue rosuvastatin  20 mg p.o. daily.    Stage 3 chronic kidney disease (HCC)  Monitor renal function and electrolytes.    Anxiety Continue sertraline  100 mg p.o. daily.    Gastroesophageal reflux disease Antiacid, H2 blocker or PPI as needed.    Advance Care Planning:   Code Status: Full Code   Consults:   Family Communication:   Severity of Illness: The appropriate patient status for this patient is OBSERVATION. Observation status is judged to be reasonable and necessary in order to provide the required intensity of service to ensure the patient's safety. The patient's presenting symptoms, physical exam findings, and initial radiographic and laboratory data in the context of their medical condition is felt to place them at decreased risk for further clinical deterioration. Furthermore, it is anticipated that the patient will be medically stable for discharge from the hospital within 2 midnights of admission.   Author: Alm Dorn Castor, MD 05/23/2024 1:41 PM  For on call review [email or web address].   This document was prepared using Dragon voice recognition software and may contain some unintended transcription errors.      [1]  Allergies Allergen Reactions   Doxycycline Itching, Swelling and Other (See Comments)    Facial swelling, itching   Morphine And Codeine Swelling   Zomig [Zolmitriptan] Nausea And Vomiting    Increased heart rate     Augmentin  [Amoxicillin -Pot Clavulanate] Diarrhea   Ceftin [Cefuroxime Axetil] Other (See Comments)    Stomach ache   Gabapentin Other (See Comments)    Confusion, off balance   Ferrous Sulfate  Diarrhea   Methazolamide Diarrhea   Biaxin [Clarithromycin] Rash   Cefdinir Rash   Sumatriptan  Other (See Comments)    Can use brand Imitrex 

## 2024-05-23 NOTE — ED Provider Notes (Signed)
 [place] EMERGENCY DEPARTMENT AT [facility] Provider Note   CSN: [id number] Arrival date & time: 05/23/24  1033     Patient presents with: Weakness   Lynn Jenkins is a 78 y.o. female.  With history of hyperparathyroidism s/p parathyroidectomy in 2023 and most recently on 05/17/2024.  Patient is here for evaluation of numbness/tingling that began earlier today and involved her upper and lower extremities as well as her face.  This numbness tingling has eased off though it is still present.  She has also been having muscle cramps/tightness.  She also had diarrhea this morning; going on to say that she often has diarrhea secondary her to her medications.  Denies shortness of breath, chest pain, or syncope.  Denies nausea at this time.  She continues to feel numbness/tingling but does note it has improved.  She has some residual numbness/tingling of her face and hands.  Patient reports taking all medications this morning as directed.  Patient underwent parathyroidectomy at our facility on Monday of this week.  She was planned to only stay 1 night however she had a reaction to anesthesia, going on to say that she had a seizure and other complications so she was not actually discharged until Thursday of this week.  She reports numbness/tingling since discharge however, she became very concerned when the numbness/tingling was much more pronounced this morning along with a lightheaded feeling and she felt as if I was going to die.  The history is provided by the patient.  Weakness Severity:  Moderate      Prior to Admission medications  Medication Sig Start Date End Date Taking? Authorizing Provider  acetaminophen  (TYLENOL ) 500 MG tablet Take 500-1,000 mg by mouth every 6 (six) hours as needed for mild pain or moderate pain.   Yes [provider]  furosemide  (LASIX ) 20 MG tablet Take 20 mg by mouth daily as needed for fluid or edema. 08/26/18  Yes [provider]   levothyroxine  (SYNTHROID ) 88 MCG tablet Take 88 mcg by mouth daily before breakfast. 03/06/22  Yes [provider]  loperamide  (IMODIUM  A-D) 2 MG tablet Take 2 mg by mouth 4 (four) times daily as needed for diarrhea or loose stools.   Yes [provider]  metoCLOPramide  (REGLAN ) 10 MG tablet Take 1 tablet (10 mg total) by mouth every 6 (six) hours as needed for nausea or vomiting. Patient taking differently: Take 5-10 mg by mouth See admin instructions. Take one tablet by mouth up to 4 times a day 11/14/19  Yes Towana Ozell BROCKS, MD  Multiple Vitamin (MULTIVITAMIN WITH MINERALS) TABS tablet Take 1 tablet by mouth daily.   Yes [provider]  nitroGLYCERIN  (NITROSTAT ) 0.4 MG SL tablet Place 1 tablet (0.4 mg total) under the tongue every 5 (five) minutes as needed for chest pain. Max 3 tablets per event. 12/31/23  Yes Patwardhan, Manish J, MD  REFRESH PLUS 0.5 % SOLN Place 1 drop into both eyes 2 (two) times daily.   Yes [provider]  rosuvastatin  (CRESTOR ) 20 MG tablet Take 1 tablet (20 mg total) by mouth daily. 12/31/23  Yes Patwardhan, Manish J, MD  sertraline  (ZOLOFT ) 100 MG tablet Take 100 mg by mouth daily.   Yes [provider]  timolol  (TIMOPTIC ) 0.5 % ophthalmic solution Place 1 drop into the right eye 2 (two) times daily.   Yes [provider]  XALATAN  0.005 % ophthalmic solution Place 1 drop into the right eye at bedtime. 03/18/15  Yes  [provider]  ciclopirox  (PENLAC ) 8 % solution Apply topically at bedtime. Apply over nail and surrounding skin. Apply daily over previous coat. After seven (7) days, may remove with alcohol  and continue cycle. Patient not taking: Reported on 05/23/2024 09/01/23   Gershon Donnice SAUNDERS, DPM  ondansetron  (ZOFRAN -ODT) 4 MG disintegrating tablet 4mg  ODT q4 hours prn nausea/vomit Patient not taking: Reported on 05/23/2024 08/14/22   Patt Alm Macho, MD    Allergies: Doxycycline, Morphine and codeine,  Zomig [zolmitriptan], Augmentin  [amoxicillin -pot clavulanate], Ceftin [cefuroxime axetil], Gabapentin, Ferrous sulfate , Methazolamide, Biaxin [clarithromycin], Cefdinir, and Sumatriptan     Review of Systems  Neurological:  Positive for weakness.   Vitals:   05/23/24 1039 05/23/24 1300 05/23/24 1501 05/23/24 1522  BP: (!) 145/88 (!) 162/91 (!) 164/91   Pulse: 76 71 66   Resp: 18 16 17    Temp: 98.3 F (36.8 C)  98.8 F (37.1 C)   TempSrc: Oral  Oral   SpO2: 95% 94% 98%   Weight:    90.5 kg  Height:    5' 4 (1.626 m)    Updated Vital Signs BP (!) 164/91 (BP Location: Right Arm)   Pulse 66   Temp 98.8 F (37.1 C) (Oral)   Resp 17   Ht 5' 4 (1.626 m)   Wt 90.5 kg   SpO2 98%   BMI 34.25 kg/m   Physical Exam Vitals and nursing note reviewed.  Constitutional:      General: She is not in acute distress.    Appearance: Normal appearance. She is not toxic-appearing or diaphoretic.  HENT:     Head: Normocephalic and atraumatic.     Nose: Nose normal.     Mouth/Throat:     Mouth: Mucous membranes are moist.  Eyes:     General: No scleral icterus.    Extraocular Movements: Extraocular movements intact.     Conjunctiva/sclera: Conjunctivae normal.  Neck:     Comments: Parathyroidectomy surgical scar with associated ecchymosis.  Area appears to be well-healing and does not appear to be infected. Cardiovascular:     Rate and Rhythm: Normal rate and regular rhythm.     Pulses: Normal pulses.     Heart sounds: Normal heart sounds.  Pulmonary:     Effort: Pulmonary effort is normal. No respiratory distress.     Breath sounds: Normal breath sounds. No stridor. No wheezing, rhonchi or rales.  Abdominal:     General: Abdomen is flat. Bowel sounds are normal. There is no distension.     Palpations: Abdomen is soft.     Tenderness: There is no abdominal tenderness. There is no guarding.  Musculoskeletal:        General: Tenderness present. No swelling. Normal range of motion.      Cervical back: Normal range of motion and neck supple.     Right lower leg: No edema.     Left lower leg: No edema.     Comments: Right calf tightness and tenderness with palpation.  Skin:    General: Skin is warm and dry.     Capillary Refill: Capillary refill takes less than 2 seconds.     Coloration: Skin is not jaundiced or pale.  Neurological:     General: No focal deficit present.     Mental Status: She is alert and oriented to person, place, and time.     Sensory: No sensory deficit.     Motor: No weakness.     (all labs ordered are listed,  but only abnormal results are displayed) Labs Reviewed  COMPREHENSIVE METABOLIC PANEL WITH GFR - Abnormal; Notable for the following components:      Result Value   Potassium 3.4 (*)    Glucose, Bld 141 (*)    Creatinine, Ser 1.24 (*)    Calcium  7.5 (*)    Alkaline Phosphatase 163 (*)    Total Bilirubin 1.5 (*)    GFR, Estimated 45 (*)    All other components within normal limits  CBC - Abnormal; Notable for the following components:   WBC 13.3 (*)    All other components within normal limits  URINALYSIS, ROUTINE W REFLEX MICROSCOPIC - Abnormal; Notable for the following components:   APPearance HAZY (*)    Hgb urine dipstick SMALL (*)    Protein, ur 30 (*)    Bacteria, UA FEW (*)    All other components within normal limits  MAGNESIUM  - Abnormal; Notable for the following components:   Magnesium  1.6 (*)    All other components within normal limits  CALCIUM  - Abnormal; Notable for the following components:   Calcium  7.5 (*)    All other components within normal limits  CBG MONITORING, ED - Abnormal; Notable for the following components:   Glucose-Capillary 126 (*)    All other components within normal limits  PHOSPHORUS  CALCIUM   COMPREHENSIVE METABOLIC PANEL WITH GFR  CBC    EKG: EKG Interpretation Date/Time:  Sunday May 23 2024 11:47:51 EST Ventricular Rate:  70 PR Interval:  139 QRS Duration:  136 QT  Interval:  470 QTC Calculation: 508 R Axis:   5  Text Interpretation: Sinus rhythm Right bundle branch block Confirmed by Neysa Clap [phone]) on 05/23/2024 12:47:10 PM  Radiology: No results found.   Procedures   Medications Ordered in the ED  calcium  carbonate (TUMS - dosed in mg elemental calcium ) chewable tablet 400 mg of elemental calcium  (400 mg of elemental calcium  Oral Given 05/23/24 1646)  acetaminophen  (TYLENOL ) tablet 650 mg (650 mg Oral Given 05/23/24 1506)    Or  acetaminophen  (TYLENOL ) suppository 650 mg ( Rectal See Alternative 05/23/24 1506)  ondansetron  (ZOFRAN ) tablet 4 mg (has no administration in time range)    Or  ondansetron  (ZOFRAN ) injection 4 mg (has no administration in time range)  0.9 %  sodium chloride  infusion ( Intravenous New Bag/Given 05/23/24 1505)  calcium  gluconate 1 g/ 50 mL sodium chloride  IVPB (1,000 mg Intravenous New Bag/Given 05/23/24 1331)  cyclobenzaprine  (FLEXERIL ) tablet 5 mg (5 mg Oral Given 05/23/24 1332)  magnesium  sulfate IVPB 2 g 50 mL (2 g Intravenous New Bag/Given 05/23/24 1506)  potassium chloride  SA (KLOR-CON  M) CR tablet 40 mEq (40 mEq Oral Given 05/23/24 1506)     Patient presents to the ED for concern of numbness/tingling and muscle tightness/cramping, this involves an extensive number of treatment options, and is a complaint that carries with it a high risk of complications and morbidity.  The differential diagnosis includes stroke, hypocalcemia in the setting of recent parathyroidectomy.  Based on physical exam findings, I have low suspicion for stroke.  Numbness/tingling is bilateral and she has no other neurosymptoms.   Co morbidities that complicate the patient evaluation  Recent parathyroidectomy CKD stage III Hypothyroidism Hyperparathyroidism   Additional history obtained:  Additional history obtained from  Outside Medical Records and Past Admission   External records from outside source obtained and  reviewed including recent surgical procedure and related hospital admission.   Lab Tests:  I Ordered, and  personally interpreted labs.  The pertinent results include:  Low calcium  of 7.5; most recent calcium  was 11.0 on 12/9.   Slightly low potassium at 3.4; most recent potassium was 4.5 on 12/9.   Slightly low magnesium  at 1.6.   Elevated WBC of 13.3; most recent WBC was 7.9 on 12/9.   UA is unremarkable.   Cardiac Monitoring:  The patient was maintained on a cardiac monitor.  I personally viewed and interpreted the cardiac monitored which showed an underlying rhythm of: Sinus rhythm with a right bundle branch show no evidence of STEMI   Medicines ordered and prescription drug management:  I ordered medication including Flexeril  and calcium  gluconate for muscle cramping and hypocalcemia. Reevaluation of the patient after these medicines showed that the patient improved   Consultations Obtained:  I requested consultation with the hospitalist Charolotte Castor MD),  and discussed lab and imaging findings as well as pertinent plan - they recommend: Admission to hospital for higher level of care and ongoing management.   Problem List / ED Course:  Clinical Course as of 05/23/24 1811  Sun May 23, 2024  1316 On reevaluation, patient notes muscle cramping of the right lower extremity.  This extremity does feel tight.  This is likely secondary to hypocalcemia.  Calcium  gluconate has been ordered and I have also ordered Flexeril  for patient comfort. [EA]    Clinical Course User Index [EA] Rosina Almarie LABOR, PA-C    Hypocalcemia in the setting of recent parathyroidectomy.  Lab work findings as above.  Patient started on calcium  gluconate for hypocalcemia.  Patient also given Flexeril  for current muscle cramps.  Hospitalist consulted and accepted admission of patient for ongoing management and care.   Dispostion:  Patient admitted to hospital for ongoing management.  Medical Decision  Making Amount and/or Complexity of Data Reviewed Labs: ordered.  Risk Prescription drug management. Decision regarding hospitalization.   This note was produced using Electronics Engineer. While the provider has reviewed and verified all clinical information, transcription errors may remain.    Final diagnoses:  Hypocalcemia    ED Discharge Orders     None          Rosina Almarie LABOR, PA-C 05/23/24 1811    Neysa Caron PARAS, DO 05/24/24 1707

## 2024-05-24 LAB — CBC
HCT: 35.7 % — ABNORMAL LOW (ref 36.0–46.0)
Hemoglobin: 11.6 g/dL — ABNORMAL LOW (ref 12.0–15.0)
MCH: 28.6 pg (ref 26.0–34.0)
MCHC: 32.5 g/dL (ref 30.0–36.0)
MCV: 88.1 fL (ref 80.0–100.0)
Platelets: 192 K/uL (ref 150–400)
RBC: 4.05 MIL/uL (ref 3.87–5.11)
RDW: 15.5 % (ref 11.5–15.5)
WBC: 9.8 K/uL (ref 4.0–10.5)
nRBC: 0 % (ref 0.0–0.2)

## 2024-05-24 LAB — COMPREHENSIVE METABOLIC PANEL WITH GFR
ALT: 24 U/L (ref 0–44)
AST: 36 U/L (ref 15–41)
Albumin: 3.4 g/dL — ABNORMAL LOW (ref 3.5–5.0)
Alkaline Phosphatase: 162 U/L — ABNORMAL HIGH (ref 38–126)
Anion gap: 13 (ref 5–15)
BUN: 18 mg/dL (ref 8–23)
CO2: 23 mmol/L (ref 22–32)
Calcium: 7.8 mg/dL — ABNORMAL LOW (ref 8.9–10.3)
Chloride: 106 mmol/L (ref 98–111)
Creatinine, Ser: 1.13 mg/dL — ABNORMAL HIGH (ref 0.44–1.00)
GFR, Estimated: 50 mL/min — ABNORMAL LOW (ref >60)
Glucose, Bld: 127 mg/dL — ABNORMAL HIGH (ref 70–99)
Potassium: 3.8 mmol/L (ref 3.5–5.1)
Sodium: 142 mmol/L (ref 135–145)
Total Bilirubin: 1.1 mg/dL (ref 0.0–1.2)
Total Protein: 7 g/dL (ref 6.5–8.1)

## 2024-05-24 LAB — CALCIUM: Calcium: 7.6 mg/dL — ABNORMAL LOW (ref 8.9–10.3)

## 2024-05-24 LAB — MAGNESIUM: Magnesium: 1.8 mg/dL (ref 1.7–2.4)

## 2024-05-24 MED ORDER — TRAMADOL HCL 50 MG PO TABS
25.0000 mg | ORAL_TABLET | Freq: Four times a day (QID) | ORAL | Status: DC | PRN
  Administered 2024-05-24 – 2024-05-25 (×3): 25 mg via ORAL
  Filled 2024-05-24 (×3): qty 1

## 2024-05-24 MED ORDER — SODIUM CHLORIDE 0.9 % IV SOLN: INTRAVENOUS | Status: AC

## 2024-05-24 MED ORDER — TRAMADOL HCL 50 MG PO TABS
25.0000 mg | ORAL_TABLET | Freq: Once | ORAL | Status: AC
  Administered 2024-05-24: 08:00:00 25 mg via ORAL
  Filled 2024-05-24: qty 1

## 2024-05-24 NOTE — Evaluation (Signed)
 Physical Therapy Evaluation Patient Details Name: Lynn Jenkins MRN: [id number] DOB: 06-30-1945 Today's Date: 05/24/2024  History of Present Illness  Lynn Jenkins is a 78 y.o. female who presented to [facility] ED with generalized weakness, dyspnea, diarrhea, facial, hand and lower extremity numbness sensation. Dx with  Hyperparathyroidism, Hypocalcemia,Hypokalemia, Hypomagnesemia  PMH: normocytic anemia, osteoarthritis, CKD, general anxiety disorder, GERD, hiatal hernia, glaucoma, migraine headache, hypothyroidism, unspecified lupus, mixed hyperlipidemia, multinodular goiter, osteopenia, history of pneumonia, varicose vein, vitamin D  deficiency, primary  Clinical Impression   Pt admitted with above diagnosis.  Pt currently with functional limitations due to the deficits listed below (see PT Problem List). Pt will benefit from acute skilled PT to increase their independence and safety with mobility to allow discharge.      The patient has been independent, driving, taking care of ADL's, spouse passed away  3 months ago.  Patient  is S/P parathyroid  surgery 1 week ago and  has become weak. Hopefully patient should progress to return home. Patient has some support from a neighbor. Recommend HHPT and an AIDe. Patient does report need for new rollator if eligible.   BP 167/95  after transfer to [facility] then to recliner, reporting right lower quadrant pain and  dizziness.    If plan is discharge home, recommend the following: Assist for transportation;Help with stairs or ramp for entrance;A little help with bathing/dressing/bathroom;Assistance with cooking/housework   Can travel by private Data Processing Manager (4 wheels) (needs a new one if elligible)  Recommendations for Other Services       Functional Status Assessment Patient has had a recent decline in their functional status and demonstrates the ability to make significant improvements in function in a reasonable and  predictable amount of time.     Precautions / Restrictions Precautions Precautions: Fall Precaution/Restrictions Comments: recent parathyroid  sg,      Mobility  Bed Mobility Overal bed mobility: Needs Assistance Bed Mobility: Supine to Sit     Supine to sit: Supervision          Transfers Overall transfer level: Needs assistance Equipment used: Rolling walker (2 wheels) Transfers: Sit to/from Stand, Bed to chair/wheelchair/BSC Sit to Stand: Min assist   Step pivot transfers: Min assist       General transfer comment: step pivot to [facility] then to recliner. reports dizziness.    Ambulation/Gait                  Stairs            Wheelchair Mobility     Tilt Bed    Modified Rankin (Stroke Patients Only)       Balance Overall balance assessment: Needs assistance Sitting-balance support: Feet supported, No upper extremity supported Sitting balance-Leahy Scale: Good     Standing balance support: Reliant on assistive device for balance, During functional activity, No upper extremity supported Standing balance-Leahy Scale: Poor                               Pertinent Vitals/Pain Pain Assessment Pain Assessment: 0-10 Pain Score: 6  Pain Location: back, neck and R side Pain Descriptors / Indicators: Aching, Sore Pain Intervention(s): Monitored during session, Limited activity within patient's tolerance, Heat applied, Repositioned    Home Living Family/patient expects to be discharged to:: Private residence Living Arrangements: Alone Available Help at Discharge: Friend(s) Type of Home: House Home Access: Stairs to enter  Entrance Stairs-Number of Steps: 1   Home Layout: One level Home Equipment: Agricultural Consultant (2 wheels);Educational Psychologist (4 wheels) Additional Comments: states rollator is brken    Prior Function Prior Level of Function : Independent/Modified Independent;Driving             Mobility Comments:  independent including driving until recent thyroid  surgery ADLs Comments: has 2 dogs, was indep for groceries and IADL's until     Extremity/Trunk Assessment   Upper Extremity Assessment Upper Extremity Assessment: Right hand dominant    Lower Extremity Assessment Lower Extremity Assessment: Generalized weakness    Cervical / Trunk Assessment Cervical / Trunk Assessment: Normal  Communication   Communication Communication: No apparent difficulties    Cognition Arousal: Alert Behavior During Therapy: WFL for tasks assessed/performed   PT - Cognitive impairments: No apparent impairments                         Following commands: Intact       Cueing       General Comments      Exercises     Assessment/Plan    PT Assessment Patient needs continued PT services  PT Problem List Decreased strength;Decreased activity tolerance;Decreased knowledge of use of DME;Decreased mobility       PT Treatment Interventions DME instruction;Gait training;Functional mobility training;Therapeutic activities;Therapeutic exercise;Patient/family education    PT Goals (Current goals can be found in the Care Plan section)  Acute Rehab PT Goals Patient Stated Goal: to go home to her dogs PT Goal Formulation: With patient Time For Goal Achievement: 06/07/24 Potential to Achieve Goals: Good    Frequency Min 3X/week     Co-evaluation PT/OT/SLP Co-Evaluation/Treatment: Yes Reason for Co-Treatment: To address functional/ADL transfers PT goals addressed during session: Mobility/safety with mobility OT goals addressed during session: ADL's and self-care       AM-PAC PT 6 Clicks Mobility  Outcome Measure Help needed turning from your back to your side while in a flat bed without using bedrails?: A Little Help needed moving from lying on your back to sitting on the side of a flat bed without using bedrails?: A Little Help needed moving to and from a bed to a chair  (including a wheelchair)?: A Little Help needed standing up from a chair using your arms (e.g., wheelchair or bedside chair)?: A Lot Help needed to walk in hospital room?: A Lot Help needed climbing 3-5 steps with a railing? : Total 6 Click Score: 14    End of Session Equipment Utilized During Treatment: Gait belt Activity Tolerance: Patient limited by fatigue;Patient limited by pain Patient left: in chair;with call bell/phone within reach;with chair alarm set Nurse Communication: Mobility status PT Visit Diagnosis: Unsteadiness on feet (R26.81);Muscle weakness (generalized) (M62.81);Pain    Time: 8561-8491 PT Time Calculation (min) (ACUTE ONLY): 30 min   Charges:   PT Evaluation $PT Eval Low Complexity: 1 Low   PT General Charges $$ ACUTE PT VISIT: 1 Visit         Darice Potters PT Acute Rehabilitation Services Office [phone]   Potters Darice Norris 05/24/2024, 4:06 PM

## 2024-05-24 NOTE — Progress Notes (Signed)
 PROGRESS NOTE    Lynn Jenkins  FMW:[id number] DOB: 27-Dec-1945 DOA: 05/23/2024 PCP: Okey Carlin Redbird, MD    Brief Narrative:  78 year old with history of generalized anxiety disorder, GERD, hypothyroidism, hyperlipidemia, vitamin D  deficiency, primary hypothyroidism status post total parathyroidectomy 6 days ago presented to the emergency room with generalized weakness, shortness of breath, diarrhea and extremity numbness sensation.  According to the patient, she had persistent vomiting and difficulty tolerating diet after surgery.  Calcium  was 11 on discharge. In the emergency room afebrile.  On room air.  Blood pressure adequate.  Calcium  was 7.5, magnesium  was 1.6.  Patient was admitted due to significant symptoms.  Was given calcium  gluconate.  Subjective: Patient seen in the morning rounds.  Denies any complaints at rest.  Not sure how she will do with mobility but she feels overall improved and she thinks she can eat regular food now. Not been out of bed yet. Latest calcium  7.8-corrected calcium  8.1.  Assessment & Plan:   Symptomatic hypocalcemia after total parathyroidectomy: Currently remains hemodynamically stable.  Patient was given calcium  gluconate x 2, started on oral calcium .  Symptomatically improving with replacement. Recheck calcium  in the evening.  Recheck level tomorrow morning.  Continue maintenance IV fluid today. Mobilize with PT OT.  Will need close monitoring of calcium  levels as outpatient.   Hypokalemia/hypomagnesemia: Replaced.  Recheck level tomorrow morning.  Chronic medical issues including Hypothyroidism, on Synthroid  continue Hyperlipidemia, on a statin continue CKD stage IIIa, is stable.  Continue. Anxiety, on sertraline  continue     DVT prophylaxis: SCDs Start: 05/23/24 1434   Code Status: Full code Family Communication: None at the bedside Disposition Plan: Status is: Observation The patient will require care spanning > 2 midnights and  should be moved to inpatient because: Symptomatic, needs close monitoring     Consultants:  None  Procedures:  None  Antimicrobials:  None     Objective: Vitals:   05/23/24 2228 05/23/24 2258 05/24/24 0342 05/24/24 0504  BP: (!) 162/107 (!) 163/92 (!) 143/83 (!) 145/81  Pulse: 67  65 67  Resp: 20 20 20 20   Temp:   98.5 F (36.9 C) 98.7 F (37.1 C)  TempSrc:   Oral Oral  SpO2: 98%  94% 96%  Weight:      Height:        Intake/Output Summary (Last 24 hours) at 05/24/2024 1056 Last data filed at 05/24/2024 0831 Gross per 24 hour  Intake 511.08 ml  Output --  Net 511.08 ml   Filed Weights   05/23/24 1522  Weight: 90.5 kg    Examination:  General exam: Appears calm and comfortable.  Pleasant interaction. Surgical incision on her neck clean and dry.  Slight ecchymosis present. Respiratory system: Clear to auscultation. Respiratory effort normal. Cardiovascular system: S1 & S2 heard, RRR. No JVD, murmurs, rubs, gallops or clicks. No pedal edema. Gastrointestinal system: Abdomen is nondistended, soft and nontender. No organomegaly or masses felt. Normal bowel sounds heard. Central nervous system: Alert and oriented. No focal neurological deficits. Extremities: Symmetric 5 x 5 power.    Data Reviewed: I have personally reviewed following labs and imaging studies  CBC: Recent Labs  Lab 05/23/24 1133 05/24/24 0416  WBC 13.3* 9.8  HGB 12.5 11.6*  HCT 38.7 35.7*  MCV 88.2 88.1  PLT 189 192   Basic Metabolic Panel: Recent Labs  Lab 05/18/24 0455 05/23/24 1133 05/23/24 1554 05/23/24 2107 05/24/24 0416 05/24/24 0417  NA  --  141  --   --   --  142  K  --  3.4*  --   --   --  3.8  CL  --  101  --   --   --  106  CO2  --  24  --   --   --  23  GLUCOSE  --  141*  --   --   --  127*  BUN  --  21  --   --   --  18  CREATININE  --  1.24*  --   --   --  1.13*  CALCIUM  11.0* 7.5* 7.5* 7.6*  --  7.8*  MG  --  1.6*  --   --  1.8  --   PHOS  --   --  2.5  --    --   --    GFR: Estimated Creatinine Clearance: 45.4 mL/min (A) (by C-G formula based on SCr of 1.13 mg/dL (H)). Liver Function Tests: Recent Labs  Lab 05/23/24 1133 05/24/24 0417  AST 27 36  ALT 20 24  ALKPHOS 163* 162*  BILITOT 1.5* 1.1  PROT 7.8 7.0  ALBUMIN 3.9 3.4*   No results for input(s): LIPASE, AMYLASE in the last 168 hours. No results for input(s): AMMONIA in the last 168 hours. Coagulation Profile: No results for input(s): INR, PROTIME in the last 168 hours. Cardiac Enzymes: No results for input(s): CKTOTAL, CKMB, CKMBINDEX, TROPONINI in the last 168 hours. BNP (last 3 results) No results for input(s): PROBNP in the last 8760 hours. HbA1C: No results for input(s): HGBA1C in the last 72 hours. CBG: Recent Labs  Lab 05/23/24 1227  GLUCAP 126*   Lipid Profile: No results for input(s): CHOL, HDL, LDLCALC, TRIG, CHOLHDL, LDLDIRECT in the last 72 hours. Thyroid  Function Tests: No results for input(s): TSH, T4TOTAL, FREET4, T3FREE, THYROIDAB in the last 72 hours. Anemia Panel: No results for input(s): VITAMINB12, FOLATE, FERRITIN, TIBC, IRON, RETICCTPCT in the last 72 hours. Sepsis Labs: No results for input(s): PROCALCITON, LATICACIDVEN in the last 168 hours.  No results found for this or any previous visit (from the past 240 hours).       Radiology Studies: No results found.      Scheduled Meds:  artificial tears  1 drop Both Eyes BID   calcium  carbonate  400 mg of elemental calcium  Oral TID WC   latanoprost   1 drop Right Eye QHS   levothyroxine   88 mcg Oral QAC breakfast   rosuvastatin   20 mg Oral QHS   sertraline   100 mg Oral Daily   timolol   1 drop Right Eye BID   Continuous Infusions:  sodium chloride        LOS: 0 days    Time spent: 51 minutes    Renato Applebaum, MD [facility]

## 2024-05-24 NOTE — Progress Notes (Signed)
 Ca 7.8   RN notified Lynwood Kipper, NP of the above information.   No new orders at this time.

## 2024-05-24 NOTE — Progress Notes (Signed)
° °      Overnight   NAME: Lynn Jenkins MRN: [id number] DOB : 10-Oct-1945    Date of Service   05/24/2024   HPI/Events of Note    Corrected calcium  for Hypoalbuminemia is 7.9/8.3, Two scales used.   Interventions/ Plan   Continue Attending orders      Lynwood Kipper BSN MSNA MSN ACNPC-AG Acute Care Nurse Practitioner [facility]

## 2024-05-24 NOTE — Progress Notes (Signed)
°   05/24/24 0504  Assess: MEWS Score  Temp 98.7 F (37.1 C)  BP (!) 145/81  MAP (mmHg) 99  Pulse Rate 67  Resp 20  SpO2 96 %  O2 Device Room Air    21 beat SVT up to 160s. Patient asymptomatic vitals are above.     RN notified Lynwood Kipper, NP of the above information.   No new orders at this time

## 2024-05-24 NOTE — Progress Notes (Addendum)
 Patient having liquid type 7 stool, malodorous.   RN notified Lynwood Kipper, NP of the above information

## 2024-05-24 NOTE — Plan of Care (Signed)
  Problem: Clinical Measurements: Goal: Respiratory complications will improve Outcome: Progressing Goal: Cardiovascular complication will be avoided Outcome: Progressing   Problem: Activity: Goal: Risk for activity intolerance will decrease Outcome: Progressing   Problem: Coping: Goal: Level of anxiety will decrease Outcome: Progressing   Problem: Elimination: Goal: Will not experience complications related to bowel motility Outcome: Progressing Goal: Will not experience complications related to urinary retention Outcome: Progressing   Problem: Pain Managment: Goal: General experience of comfort will improve and/or be controlled Outcome: Progressing   Problem: Safety: Goal: Ability to remain free from injury will improve Outcome: Progressing   Problem: Skin Integrity: Goal: Risk for impaired skin integrity will decrease Outcome: Progressing

## 2024-05-24 NOTE — Evaluation (Signed)
 Occupational Therapy Evaluation Patient Details Name: Lynn Jenkins MRN: [id number] DOB: 05-05-1946 Today's Date: 05/24/2024   History of Present Illness   Lynn Jenkins is a 78 y.o. female who presented to [facility] ED with generalized weakness, dyspnea, diarrhea, facial, hand and lower extremity numbness sensation. Dx with  Hyperparathyroidism, Hypocalcemia,Hypokalemia, Hypomagnesemia  PMH: normocytic anemia, osteoarthritis, CKD, general anxiety disorder, GERD, hiatal hernia, glaucoma, migraine headache, hypothyroidism, unspecified lupus, mixed hyperlipidemia, multinodular goiter, osteopenia, history of pneumonia, varicose vein, vitamin D  deficiency, primary     Clinical Impressions PTA, patient lives at home alone and was independent including driving prior to thyroid  surgery. Has 2 dogs, husband just passed and no local family support.  Currently, patient presents with deficits outlined below (see OT Problem List for details) most significantly pain, decreased activity tolerance, balance R UE ROM and generalized muscle weakness limiting BADL's and functional mobility performance. Recommending HHOT services including HH aide upon hospital discharge and 3 in 1 commode for toileting safety. Patient requires continued Acute care hospital level OT services to progress safety and functional performance and allow for discharge.       If plan is discharge home, recommend the following:   A lot of help with walking and/or transfers;A little help with bathing/dressing/bathroom;Assistance with cooking/housework;Assist for transportation;Help with stairs or ramp for entrance     Functional Status Assessment   Patient has had a recent decline in their functional status and demonstrates the ability to make significant improvements in function in a reasonable and predictable amount of time.     Equipment Recommendations   BSC/3in1      Precautions/Restrictions   Precautions Precautions:  Fall Precaution/Restrictions Comments: recent parathy sg, Restrictions Weight Bearing Restrictions Per Provider Order: No     Mobility Bed Mobility Overal bed mobility: Needs Assistance Bed Mobility: Supine to Sit     Supine to sit: Supervision          Transfers Overall transfer level: Needs assistance Equipment used: Rolling walker (2 wheels) Transfers: Sit to/from Stand, Bed to chair/wheelchair/BSC Sit to Stand: Min assist     Step pivot transfers: Min assist     General transfer comment: step pivot to [facility] then to recliner. reports dizziness.      Balance Overall balance assessment: Needs assistance Sitting-balance support: Feet supported, No upper extremity supported Sitting balance-Leahy Scale: Good     Standing balance support: Reliant on assistive device for balance, During functional activity, No upper extremity supported Standing balance-Leahy Scale: Poor                             ADL either performed or assessed with clinical judgement   ADL Overall ADL's : Needs assistance/impaired Eating/Feeding: Set up;Sitting   Grooming: Wash/dry hands;Wash/dry face;Oral care;Set up;Sitting   Upper Body Bathing: Contact guard assist;Sitting   Lower Body Bathing: Minimal assistance;Sit to/from stand   Upper Body Dressing : Set up;Sitting   Lower Body Dressing: Minimal assistance;Sit to/from stand   Toilet Transfer: Minimal assistance;Rolling walker (2 wheels);BSC/3in1   Toileting- Clothing Manipulation and Hygiene: Minimal assistance;Sitting/lateral lean       Functional mobility during ADLs: Minimal assistance;Rolling walker (2 wheels) General ADL Comments: decreased activity tolerance and LB functional reach     Vision Baseline Vision/History: 1 Wears glasses;3 Glaucoma Ability to See in Adequate Light: 1 Impaired Patient Visual Report: No change from baseline Vision Assessment?: Wears glasses for reading;Wears glasses for  driving Additional Comments: needs updated prescriptions  Pertinent Vitals/Pain Pain Assessment Pain Assessment: 0-10 Pain Score: 6  Pain Location: back, neck and R side Pain Descriptors / Indicators: Aching, Sore Pain Intervention(s): Monitored during session, Premedicated before session, Repositioned     Extremity/Trunk Assessment Upper Extremity Assessment Upper Extremity Assessment: Right hand dominant;Generalized weakness;RUE deficits/detail RUE Deficits / Details: limited AROM to ~110 sh flexion   Lower Extremity Assessment Lower Extremity Assessment: Defer to PT evaluation   Cervical / Trunk Assessment Cervical / Trunk Assessment: Normal   Communication Communication Communication: No apparent difficulties   Cognition Arousal: Alert Behavior During Therapy: WFL for tasks assessed/performed Cognition: No apparent impairments                               Following commands: Intact       Cueing  General Comments      bruising from thy sx on l side of neck, pain in R flank/rib region, limited activity tolerance due to dizziness in standing           Home Living Family/patient expects to be discharged to:: Private residence Living Arrangements: Alone Available Help at Discharge: Friend(s) Type of Home: House Home Access: Stairs to enter Secretary/administrator of Steps: 1   Home Layout: One level     Bathroom Shower/Tub: Tub/shower unit         Home Equipment: Agricultural Consultant (2 wheels);Educational Psychologist (4 wheels)   Additional Comments: states rollator is broken      Prior Functioning/Environment Prior Level of Function : Independent/Modified Independent;Driving             Mobility Comments: independent including driving until recent thyroid  surgery ADLs Comments: has 2 dogs, was indep for groceries and IADL's until surgery    OT Problem List: Decreased strength;Decreased activity tolerance;Decreased range of  motion;Impaired balance (sitting and/or standing);Cardiopulmonary status limiting activity;Pain;Impaired UE functional use   OT Treatment/Interventions: Self-care/ADL training;Therapeutic exercise;Neuromuscular education;Energy conservation;DME and/or AE instruction;Therapeutic activities;Patient/family education;Balance training      OT Goals(Current goals can be found in the care plan section)   Acute Rehab OT Goals Patient Stated Goal: to get stronger to go home OT Goal Formulation: With patient Time For Goal Achievement: 06/07/24 Potential to Achieve Goals: Good ADL Goals Pt Will Perform Lower Body Bathing: with set-up;sit to/from stand;with adaptive equipment Pt Will Perform Lower Body Dressing: with supervision;sit to/from stand;with adaptive equipment Pt Will Transfer to Toilet: with supervision;ambulating;bedside commode Pt Will Perform Toileting - Clothing Manipulation and hygiene: with set-up;sit to/from stand Pt Will Perform Tub/Shower Transfer: Shower transfer;shower seat;rolling walker;with supervision Pt/caregiver will Perform Home Exercise Program: Increased strength;Both right and left upper extremity;With written HEP provided;Independently Additional ADL Goal #1: Patient will teach back ECT's for BADL's and mobility indep   OT Frequency:  Min 2X/week    Co-evaluation   Reason for Co-Treatment: To address functional/ADL transfers PT goals addressed during session: Mobility/safety with mobility OT goals addressed during session: ADL's and self-care      AM-PAC OT 6 Clicks Daily Activity     Outcome Measure Help from another person eating meals?: A Little Help from another person taking care of personal grooming?: A Little Help from another person toileting, which includes using toliet, bedpan, or urinal?: A Little Help from another person bathing (including washing, rinsing, drying)?: A Little Help from another person to put on and taking off regular upper body  clothing?: A Little Help from another person to put on and  taking off regular lower body clothing?: A Little 6 Click Score: 18   End of Session Equipment Utilized During Treatment: Gait belt;Rolling walker (2 wheels) Nurse Communication: Mobility status;Other (comment) (voding data entered on Flowsheet)  Activity Tolerance: Patient limited by fatigue Patient left: in chair;with call bell/phone within reach;with chair alarm set  OT Visit Diagnosis: Unsteadiness on feet (R26.81);History of falling (Z91.81);Pain Pain - part of body:  (neck)                Time: 1435-1500 OT Time Calculation (min): 25 min Charges:  OT General Charges $OT Visit: 1 Visit OT Evaluation $OT Eval Low Complexity: 1 Low  Lynn Jenkins OT/L Acute Rehabilitation Department  [phone]  05/24/2024, 4:49 PM

## 2024-05-24 NOTE — Care Management Obs Status (Signed)
 MEDICARE OBSERVATION STATUS NOTIFICATION   Patient Details  Name: Lynn Jenkins MRN: [id number] Date of Birth: 02-12-1946   Medicare Observation Status Notification Given:  Yes    Jon ONEIDA Anon, RN 05/24/2024, 4:22 PM

## 2024-05-25 ENCOUNTER — Other Ambulatory Visit (HOSPITAL_COMMUNITY): Payer: Self-pay

## 2024-05-25 DIAGNOSIS — E041 Nontoxic single thyroid nodule: Secondary | ICD-10-CM | POA: Diagnosis present

## 2024-05-25 DIAGNOSIS — R5382 Chronic fatigue, unspecified: Secondary | ICD-10-CM | POA: Diagnosis present

## 2024-05-25 DIAGNOSIS — E669 Obesity, unspecified: Secondary | ICD-10-CM | POA: Diagnosis present

## 2024-05-25 DIAGNOSIS — M7918 Myalgia, other site: Secondary | ICD-10-CM | POA: Diagnosis present

## 2024-05-25 DIAGNOSIS — Z79899 Other long term (current) drug therapy: Secondary | ICD-10-CM | POA: Diagnosis not present

## 2024-05-25 DIAGNOSIS — E8809 Other disorders of plasma-protein metabolism, not elsewhere classified: Secondary | ICD-10-CM | POA: Diagnosis present

## 2024-05-25 DIAGNOSIS — H401123 Primary open-angle glaucoma, left eye, severe stage: Secondary | ICD-10-CM | POA: Diagnosis present

## 2024-05-25 DIAGNOSIS — Z885 Allergy status to narcotic agent status: Secondary | ICD-10-CM | POA: Diagnosis not present

## 2024-05-25 DIAGNOSIS — F419 Anxiety disorder, unspecified: Secondary | ICD-10-CM | POA: Diagnosis present

## 2024-05-25 DIAGNOSIS — M549 Dorsalgia, unspecified: Secondary | ICD-10-CM | POA: Diagnosis present

## 2024-05-25 DIAGNOSIS — H409 Unspecified glaucoma: Secondary | ICD-10-CM | POA: Diagnosis present

## 2024-05-25 DIAGNOSIS — Z8249 Family history of ischemic heart disease and other diseases of the circulatory system: Secondary | ICD-10-CM | POA: Diagnosis not present

## 2024-05-25 DIAGNOSIS — K219 Gastro-esophageal reflux disease without esophagitis: Secondary | ICD-10-CM | POA: Diagnosis present

## 2024-05-25 DIAGNOSIS — E039 Hypothyroidism, unspecified: Secondary | ICD-10-CM | POA: Diagnosis present

## 2024-05-25 DIAGNOSIS — E782 Mixed hyperlipidemia: Secondary | ICD-10-CM | POA: Diagnosis present

## 2024-05-25 DIAGNOSIS — Z79890 Hormone replacement therapy (postmenopausal): Secondary | ICD-10-CM | POA: Diagnosis not present

## 2024-05-25 DIAGNOSIS — N1831 Chronic kidney disease, stage 3a: Secondary | ICD-10-CM | POA: Diagnosis present

## 2024-05-25 DIAGNOSIS — R252 Cramp and spasm: Secondary | ICD-10-CM | POA: Diagnosis present

## 2024-05-25 DIAGNOSIS — M810 Age-related osteoporosis without current pathological fracture: Secondary | ICD-10-CM | POA: Diagnosis present

## 2024-05-25 DIAGNOSIS — Z6834 Body mass index (BMI) 34.0-34.9, adult: Secondary | ICD-10-CM | POA: Diagnosis not present

## 2024-05-25 DIAGNOSIS — E876 Hypokalemia: Secondary | ICD-10-CM | POA: Diagnosis present

## 2024-05-25 DIAGNOSIS — E162 Hypoglycemia, unspecified: Secondary | ICD-10-CM | POA: Diagnosis present

## 2024-05-25 DIAGNOSIS — Z881 Allergy status to other antibiotic agents status: Secondary | ICD-10-CM | POA: Diagnosis not present

## 2024-05-25 LAB — COMPREHENSIVE METABOLIC PANEL WITH GFR
ALT: 32 U/L (ref 0–44)
AST: 36 U/L (ref 15–41)
Albumin: 3.4 g/dL — ABNORMAL LOW (ref 3.5–5.0)
Alkaline Phosphatase: 212 U/L — ABNORMAL HIGH (ref 38–126)
Anion gap: 12 (ref 5–15)
BUN: 21 mg/dL (ref 8–23)
CO2: 25 mmol/L (ref 22–32)
Calcium: 7.2 mg/dL — ABNORMAL LOW (ref 8.9–10.3)
Chloride: 106 mmol/L (ref 98–111)
Creatinine, Ser: 1.14 mg/dL — ABNORMAL HIGH (ref 0.44–1.00)
GFR, Estimated: 49 mL/min — ABNORMAL LOW (ref >60)
Glucose, Bld: 110 mg/dL — ABNORMAL HIGH (ref 70–99)
Potassium: 3.6 mmol/L (ref 3.5–5.1)
Sodium: 143 mmol/L (ref 135–145)
Total Bilirubin: 0.5 mg/dL (ref 0.0–1.2)
Total Protein: 7 g/dL (ref 6.5–8.1)

## 2024-05-25 LAB — CBC WITH DIFFERENTIAL/PLATELET
Abs Immature Granulocytes: 0.03 K/uL (ref 0.00–0.07)
Basophils Absolute: 0.1 K/uL (ref 0.0–0.1)
Basophils Relative: 1 %
Eosinophils Absolute: 0.2 K/uL (ref 0.0–0.5)
Eosinophils Relative: 3 %
HCT: 37 % (ref 36.0–46.0)
Hemoglobin: 11.8 g/dL — ABNORMAL LOW (ref 12.0–15.0)
Immature Granulocytes: 0 %
Lymphocytes Relative: 20 %
Lymphs Abs: 1.5 K/uL (ref 0.7–4.0)
MCH: 28.9 pg (ref 26.0–34.0)
MCHC: 31.9 g/dL (ref 30.0–36.0)
MCV: 90.5 fL (ref 80.0–100.0)
Monocytes Absolute: 0.6 K/uL (ref 0.1–1.0)
Monocytes Relative: 7 %
Neutro Abs: 5.4 K/uL (ref 1.7–7.7)
Neutrophils Relative %: 69 %
Platelets: 186 K/uL (ref 150–400)
RBC: 4.09 MIL/uL (ref 3.87–5.11)
RDW: 15.2 % (ref 11.5–15.5)
WBC: 7.8 K/uL (ref 4.0–10.5)
nRBC: 0 % (ref 0.0–0.2)

## 2024-05-25 LAB — MAGNESIUM: Magnesium: 1.7 mg/dL (ref 1.7–2.4)

## 2024-05-25 LAB — CALCIUM: Calcium: 7.7 mg/dL — ABNORMAL LOW (ref 8.9–10.3)

## 2024-05-25 LAB — PHOSPHORUS: Phosphorus: 4.2 mg/dL (ref 2.5–4.6)

## 2024-05-25 MED ORDER — TRAMADOL HCL 50 MG PO TABS
50.0000 mg | ORAL_TABLET | Freq: Four times a day (QID) | ORAL | Status: DC | PRN
  Administered 2024-05-25 – 2024-05-30 (×9): 50 mg via ORAL
  Filled 2024-05-25 (×9): qty 1

## 2024-05-25 MED ORDER — LIDOCAINE 5 % EX PTCH
1.0000 | MEDICATED_PATCH | CUTANEOUS | Status: DC
  Administered 2024-05-25 – 2024-05-31 (×7): 1 via TRANSDERMAL
  Filled 2024-05-25 (×7): qty 1

## 2024-05-25 MED ORDER — MAGNESIUM OXIDE -MG SUPPLEMENT 400 (240 MG) MG PO TABS
400.0000 mg | ORAL_TABLET | Freq: Two times a day (BID) | ORAL | Status: DC
  Administered 2024-05-25 – 2024-05-31 (×13): 400 mg via ORAL
  Filled 2024-05-25 (×13): qty 1

## 2024-05-25 MED ORDER — CALCIUM GLUCONATE-NACL 1-0.675 GM/50ML-% IV SOLN
1.0000 g | Freq: Once | INTRAVENOUS | Status: AC
  Administered 2024-05-25: 14:00:00 1000 mg via INTRAVENOUS
  Filled 2024-05-25: qty 50

## 2024-05-25 MED ORDER — CALCITRIOL 0.25 MCG PO CAPS
0.2500 ug | ORAL_CAPSULE | Freq: Two times a day (BID) | ORAL | Status: DC
  Administered 2024-05-25 – 2024-05-26 (×3): 0.25 ug via ORAL
  Filled 2024-05-25 (×3): qty 1

## 2024-05-25 NOTE — TOC Initial Note (Signed)
 Transition of Care [facility]) - Initial/Assessment Note    Patient Details  Name: Lynn Jenkins MRN: [id number] Date of Birth: 1946-06-07  [facility] CM/SW Contact:    Heather DELENA Saltness, LCSW Phone Number: 05/25/2024, 1:45 PM  Clinical Narrative:                 CSW met with pt at bedside to discuss discharge planning and PT's recommendation for [facility] PT. Pt reports using home health in the past. Pt reports she would prefer to have outpatient PT. CSW sent referral for OPPT at [facility]. PT also recommending rollator. Pt reports she has an old rollator that her late spouse has used, but it's broken. CSW sent referral to [facility] for rollator, to be delivered to bedside. TOC will continue to follow.    Expected Discharge Plan: Home/Self Care Barriers to Discharge: Continued Medical Work up   Patient Goals and CMS Choice Patient states their goals for this hospitalization and ongoing recovery are:: To return home        Expected Discharge Plan and Services In-house Referral: Clinical Social Work Discharge Planning Services: NA Post Acute Care Choice: NA Living arrangements for the past 2 months: Single Family Home                 DME Arranged: Walker rolling with seat DME Agency: [employer] Date DME Agency Contacted: 05/25/24 Time DME Agency Contacted: 1344 Representative spoke with at DME Agency: [place] HH Arranged: NA HH Agency: NA        Prior Living Arrangements/Services Living arrangements for the past 2 months: Single Family Home Lives with:: Self Patient language and need for interpreter reviewed:: Yes Do you feel safe going back to the place where you live?: Yes      Need for Family Participation in Patient Care: No (Comment) Care giver support system in place?: No (comment) Current home services: DME Criminal Activity/Legal Involvement Pertinent to Current Situation/Hospitalization: No - Comment as  needed  Activities of Daily Living   ADL Screening (condition at time of admission) Independently performs ADLs?: Yes (appropriate for developmental age) Is the patient deaf or have difficulty hearing?: No Does the patient have difficulty seeing, even when wearing glasses/contacts?: No Does the patient have difficulty concentrating, remembering, or making decisions?: No  Permission Sought/Granted Permission sought to share information with : Case Manager Permission granted to share information with : Yes, Verbal Permission Granted     Permission granted to share info w AGENCY: [employer]   Emotional Assessment Appearance:: Appears stated age, Well-Groomed Attitude/Demeanor/Rapport: Engaged Affect (typically observed): Stable, Pleasant, Accepting, Adaptable, Appropriate Orientation: : Oriented to Self, Oriented to Place, Oriented to  Time, Oriented to Situation Alcohol  / Substance Use: Not Applicable Psych Involvement: No (comment)  Admission diagnosis:  Hypocalcemia [E83.51] Hypoglycemia [E16.2] Patient Active Problem List   Diagnosis Date Noted   Coffee ground emesis 05/23/2024   Gastroesophageal reflux disease 05/23/2024   Gastroparesis 05/23/2024   Anxiety 05/23/2024   Candidiasis 05/23/2024   Edema 05/23/2024   Hardening of the aorta (main artery of the heart) 05/23/2024   Multinodular goiter 05/23/2024   Morbid obesity (HCC) 05/23/2024   Impairment of balance 05/23/2024   Osteoarthritis of knee 05/23/2024   Osteopenia 05/23/2024   Stage 3 chronic kidney disease (HCC) 05/23/2024   Stasis dermatitis 05/23/2024   Vitamin D  deficiency 05/23/2024   Yeast infection 05/23/2024   Hypocalcemia 05/23/2024   Hypokalemia 05/23/2024   Hypomagnesemia  05/23/2024   Abnormal LFTs 05/23/2024   Primary hyperparathyroidism 05/17/2024   Pre-op evaluation 04/07/2024   DOE (dyspnea on exertion) 12/31/2023   Chest tightness 12/31/2023   Nonintractable headache 12/31/2023   Mixed  hyperlipidemia 12/31/2023   Daytime sleepiness 12/31/2023   Polyp of colon 09/03/2023   S/P Nissen fundoplication (without gastrostomy tube) procedure 10/15/2022   Intractable nausea and vomiting 08/15/2022   Intractable vomiting 08/14/2022   Iron deficiency anemia 03/12/2022   Obesity (BMI 30-39.9) 03/10/2022   Hypothyroidism 03/10/2022   Anemia, unspecified 03/10/2022   Migraine headache without aura 03/10/2022   Acute cholecystitis 03/09/2022   Glaucoma 04/02/2021   Disorder of bone 04/02/2021   Fibromyalgia 04/02/2021   Hypercalcemia 04/02/2021   Inflammatory arthritis 04/02/2021   History of lobectomy of thyroid  01/03/2021   Pain of left hand 09/17/2019   Leukocytosis 08/18/2019   Microcytic anemia 08/18/2019   Uveitis of left eye 06/23/2019   CME (cystoid macular edema), left 04/23/2019   Cornea guttata 04/23/2019   Combined form of age-related cataract, right eye 06/24/2018   Primary open angle glaucoma (POAG) of left eye, severe stage 06/24/2018   Hyperparathyroidism, primary 10/31/2014   Varicose veins of bilateral lower extremities with other complications 11/12/2011   PCP:  Okey Carlin Redbird, MD Pharmacy:   [facility] [phone] [place], [place] - [address] AT [address] [place] [address] Phone: [phone] Fax: [phone]     Social Drivers of Health (SDOH) Social History: SDOH Screenings   Food Insecurity: No Food Insecurity (05/23/2024)  Housing: Low Risk (05/23/2024)  Transportation Needs: No Transportation Needs (05/23/2024)  Utilities: Not At Risk (05/23/2024)  Social Connections: Unknown (05/23/2024)  Tobacco Use: Low Risk (05/23/2024)   SDOH Interventions: None     Readmission Risk Interventions     No data to display         Signed: Heather Saltness, MSW, LCSW Clinical Social Worker Inpatient Care Management 05/25/2024 1:54 PM

## 2024-05-25 NOTE — Progress Notes (Signed)
 Physical Therapy Treatment Patient Details Name: Lynn Jenkins MRN: [id number] DOB: Nov 10, 1945 Today's Date: 05/25/2024   History of Present Illness Lynn Jenkins is a 78 y.o. female who presented to [facility] ED with generalized weakness, dyspnea, diarrhea, facial, hand and lower extremity numbness sensation. Dx with  Hyperparathyroidism, Hypocalcemia,Hypokalemia, Hypomagnesemia  PMH: normocytic anemia, osteoarthritis, CKD, general anxiety disorder, GERD, hiatal hernia, glaucoma, migraine headache, hypothyroidism, unspecified lupus, mixed hyperlipidemia, multinodular goiter, osteopenia, history of pneumonia, varicose vein, vitamin D  deficiency, primary    PT Comments   The patient reports feeling some better. Patient is anxious to return home to her dogs. The patient did ambulate with Rw with 1 LOB requiring PT support. Patient has neuropathy. The patient  desires to Dc home with HHPT. Patient will benefit from A rollator.  Mobility Specialist to follow also to increase ambulation opportunities.   Continue PT.   If plan is discharge home, recommend the following: Assist for transportation;Help with stairs or ramp for entrance;A little help with bathing/dressing/bathroom;Assistance with cooking/housework   Can travel by Pension [employer] Manager (4 wheels)    Recommendations for Other Services       Precautions / Restrictions Precautions Precautions: Fall Precaution/Restrictions Comments: recent parathy sg, Restrictions Weight Bearing Restrictions Per Provider Order: No     Mobility  Bed Mobility   Bed Mobility: Supine to Sit     Supine to sit: Supervision, HOB elevated, Used rails          Transfers Overall transfer level: Needs assistance Equipment used: Rolling walker (2 wheels) Transfers: Sit to/from Stand Sit to Stand: Contact guard assist                Ambulation/Gait Ambulation/Gait assistance: Contact guard assist, Min  assist Gait Distance (Feet): 60 Feet Assistive device: Rolling walker (2 wheels) Gait Pattern/deviations: Step-to pattern, Step-through pattern, Staggering right, Staggering left Gait velocity: decr     General Gait Details: gait speed slow, patient had 1 LOB and required to steady by PT and reached for rail   Stairs             Wheelchair Mobility     Tilt Bed    Modified Rankin (Stroke Patients Only)       Balance Overall balance assessment: Needs assistance Sitting-balance support: Feet supported, No upper extremity supported Sitting balance-Leahy Scale: Good     Standing balance support: Reliant on assistive device for balance, During functional activity, Bilateral upper extremity supported Standing balance-Leahy Scale: Poor                              Communication Communication Communication: No apparent difficulties  Cognition Arousal: Alert Behavior During Therapy: WFL for tasks assessed/performed   PT - Cognitive impairments: No apparent impairments                                Cueing    Exercises      General Comments        Pertinent Vitals/Pain Pain Assessment Pain Assessment: Faces Faces Pain Scale: Hurts even more Pain Location: back, neck and R side Pain Descriptors / Indicators: Aching, Sore Pain Intervention(s): Monitored during session, Premedicated before session    Home Living  Prior Function            PT Goals (current goals can now be found in the care plan section) Progress towards PT goals: Progressing toward goals    Frequency           PT Plan      Co-evaluation              AM-PAC PT 6 Clicks Mobility   Outcome Measure  Help needed turning from your back to your side while in a flat bed without using bedrails?: None Help needed moving from lying on your back to sitting on the side of a flat bed without using bedrails?: A  Little Help needed moving to and from a bed to a chair (including a wheelchair)?: A Little Help needed standing up from a chair using your arms (e.g., wheelchair or bedside chair)?: A Little Help needed to walk in hospital room?: A Little Help needed climbing 3-5 steps with a railing? : A Lot 6 Click Score: 18    End of Session Equipment Utilized During Treatment: Gait belt Activity Tolerance: Patient limited by fatigue;Patient limited by pain Patient left: in chair;with call bell/phone within reach Nurse Communication: Mobility status PT Visit Diagnosis: Unsteadiness on feet (R26.81);Muscle weakness (generalized) (M62.81);Pain     Time: 1221-1241 PT Time Calculation (min) (ACUTE ONLY): 20 min  Charges:    $Gait Training: 8-22 mins PT General Charges $$ ACUTE PT VISIT: 1 Visit                     Darice Potters PT Acute Rehabilitation Services Office [phone]    Potters Darice Norris 05/25/2024, 12:59 PM

## 2024-05-25 NOTE — Plan of Care (Signed)

## 2024-05-25 NOTE — Progress Notes (Signed)
 Mobility Specialist - Progress Note  During mobility: 67 BPM HR, 178/93 mmHg BP, 98% SpO2   05/25/24 1633  Mobility  Activity Ambulated with assistance  Level of Assistance Minimal assist, patient does 75% or more  Assistive Device Front wheel walker  Distance Ambulated (ft) 110 ft  Activity Response Tolerated fair  Mobility Referral Yes  Mobility visit 1 Mobility  Mobility Specialist Start Time (ACUTE ONLY) 1612  Mobility Specialist Stop Time (ACUTE ONLY) 1630  Mobility Specialist Time Calculation (min) (ACUTE ONLY) 18 min   Pt was received in recliner and agreed to mobility. Min A sit to stand. 2x bouts of unsteadiness during ambulation. Pt stated feeling hot and lightheaded during session. Advised Pt to sit in chair to relieve lightheadedness. Returned to recliner with all needs met. Call bell in reach. RN notified.  [employer] - Mobility Specialist

## 2024-05-25 NOTE — Plan of Care (Signed)
  Problem: Education: Goal: Knowledge of General Education information will improve Description: Including pain rating scale, medication(s)/side effects and non-pharmacologic comfort measures Outcome: Progressing   Problem: Clinical Measurements: Goal: Will remain free from infection Outcome: Progressing   Problem: Activity: Goal: Risk for activity intolerance will decrease Outcome: Progressing   Problem: Nutrition: Goal: Adequate nutrition will be maintained Outcome: Progressing   Problem: Coping: Goal: Level of anxiety will decrease Outcome: Progressing   Problem: Elimination: Goal: Will not experience complications related to bowel motility Outcome: Progressing Goal: Will not experience complications related to urinary retention Outcome: Progressing   

## 2024-05-25 NOTE — Progress Notes (Signed)
 PROGRESS NOTE    Lynn Jenkins  FMW:[id number] DOB: June 30, 1945 DOA: 05/23/2024 PCP: Okey Carlin Redbird, MD    Brief Narrative:  78 year old with history of generalized anxiety disorder, GERD, hypothyroidism, hyperlipidemia, vitamin D  deficiency, primary hypothyroidism status post total parathyroidectomy 6 days ago presented to the emergency room with generalized weakness, shortness of breath, diarrhea and extremity numbness sensation.  According to the patient, she had persistent vomiting and difficulty tolerating diet after surgery.  Calcium  was 11 on discharge. In the emergency room afebrile.  On room air.  Blood pressure adequate.  Calcium  was 7.5, magnesium  was 1.6.  Patient was admitted due to significant symptoms.  Was given calcium  gluconate.  Subjective:  Patient seen and examined.  Has pain and discomfort on her back and also on her neck.  She had injury few months ago.  Appetite is fair.  Feels weak but she thinks she can walk today. Calcium  is 7.2. Adding calcitriol  and magnesium .  1 g of calcium  gluconate today.  Continue to monitor calcium  levels.  Will need to achieve steady level before discharging.  Assessment & Plan:   Symptomatic hypocalcemia after total parathyroidectomy: Currently remains hemodynamically stable.  Patient was given calcium  gluconate x 2, started on oral calcium .  Calcium  downtrending to 7.2.  Continue calcium  carbonate 400 mg 3 times daily, add calcitriol  0.25 mcg twice daily.  1 dose of calcium  gluconate 1 g today.  Continue to monitor calcium  levels every 12 hours.  Encourage oral intake and improve nutrition.  Hypokalemia/hypomagnesemia: Replaced.  Will keep on scheduled magnesium .  Will prescribe on discharge.  Chronic medical issues including Hypothyroidism, on Synthroid  continue Hyperlipidemia, on a statin continue CKD stage IIIa, is stable.  Continue. Anxiety, on sertraline  continue     DVT prophylaxis: SCDs Start: 05/23/24 1434   Code  Status: Full code Family Communication: None at the bedside Disposition Plan: Status is: Observation The patient will require care spanning > 2 midnights and should be moved to inpatient because: Symptomatic, needs close monitoring     Consultants:  None  Procedures:  None  Antimicrobials:  None     Objective: Vitals:   05/24/24 0504 05/24/24 1152 05/24/24 2041 05/25/24 0414  BP: (!) 145/81 (!) 145/86 139/83 (!) 172/94  Pulse: 67 63 67 66  Resp: 20 17 14 12   Temp: 98.7 F (37.1 C) 97.8 F (36.6 C) 99.4 F (37.4 C) 98.7 F (37.1 C)  TempSrc: Oral Oral    SpO2: 96% 96% 95% 98%  Weight:      Height:        Intake/Output Summary (Last 24 hours) at 05/25/2024 1259 Last data filed at 05/24/2024 1842 Gross per 24 hour  Intake 686.51 ml  Output --  Net 686.51 ml   Filed Weights   05/23/24 1522  Weight: 90.5 kg    Examination:  General exam: Appears calm and comfortable.  Pleasant interaction.  Gross generalized weakness. Surgical incision on her neck clean and dry.  Slight ecchymosis present. Respiratory system: Clear to auscultation. Respiratory effort normal. Patient has some point tenderness along the posterior back muscles.  No palpable fractures. Cardiovascular system: S1 & S2 heard, RRR. No JVD, murmurs, rubs, gallops or clicks. No pedal edema. Gastrointestinal system: Abdomen is nondistended, soft and nontender. No organomegaly or masses felt. Normal bowel sounds heard. Central nervous system: Alert and oriented. No focal neurological deficits. Extremities: Symmetric 5 x 5 power.    Data Reviewed: I have personally reviewed following labs and imaging studies  CBC: Recent Labs  Lab 05/23/24 1133 05/24/24 0416 05/25/24 0536  WBC 13.3* 9.8 7.8  NEUTROABS  --   --  5.4  HGB 12.5 11.6* 11.8*  HCT 38.7 35.7* 37.0  MCV 88.2 88.1 90.5  PLT 189 192 186   Basic Metabolic Panel: Recent Labs  Lab 05/23/24 1133 05/23/24 1554 05/23/24 2107  05/24/24 0416 05/24/24 0417 05/24/24 1708 05/25/24 0536  NA 141  --   --   --  142  --  143  K 3.4*  --   --   --  3.8  --  3.6  CL 101  --   --   --  106  --  106  CO2 24  --   --   --  23  --  25  GLUCOSE 141*  --   --   --  127*  --  110*  BUN 21  --   --   --  18  --  21  CREATININE 1.24*  --   --   --  1.13*  --  1.14*  CALCIUM  7.5* 7.5* 7.6*  --  7.8* 7.6* 7.2*  MG 1.6*  --   --  1.8  --   --  1.7  PHOS  --  2.5  --   --   --   --  4.2   GFR: Estimated Creatinine Clearance: 45 mL/min (A) (by C-G formula based on SCr of 1.14 mg/dL (H)). Liver Function Tests: Recent Labs  Lab 05/23/24 1133 05/24/24 0417 05/25/24 0536  AST 27 36 36  ALT 20 24 32  ALKPHOS 163* 162* 212*  BILITOT 1.5* 1.1 0.5  PROT 7.8 7.0 7.0  ALBUMIN 3.9 3.4* 3.4*   No results for input(s): LIPASE, AMYLASE in the last 168 hours. No results for input(s): AMMONIA in the last 168 hours. Coagulation Profile: No results for input(s): INR, PROTIME in the last 168 hours. Cardiac Enzymes: No results for input(s): CKTOTAL, CKMB, CKMBINDEX, TROPONINI in the last 168 hours. BNP (last 3 results) No results for input(s): PROBNP in the last 8760 hours. HbA1C: No results for input(s): HGBA1C in the last 72 hours. CBG: Recent Labs  Lab 05/23/24 1227  GLUCAP 126*   Lipid Profile: No results for input(s): CHOL, HDL, LDLCALC, TRIG, CHOLHDL, LDLDIRECT in the last 72 hours. Thyroid  Function Tests: No results for input(s): TSH, T4TOTAL, FREET4, T3FREE, THYROIDAB in the last 72 hours. Anemia Panel: No results for input(s): VITAMINB12, FOLATE, FERRITIN, TIBC, IRON, RETICCTPCT in the last 72 hours. Sepsis Labs: No results for input(s): PROCALCITON, LATICACIDVEN in the last 168 hours.  No results found for this or any previous visit (from the past 240 hours).       Radiology Studies: No results found.      Scheduled Meds:  artificial tears  1  drop Both Eyes BID   calcitRIOL   0.25 mcg Oral BID   calcium  carbonate  400 mg of elemental calcium  Oral TID WC   latanoprost   1 drop Right Eye QHS   levothyroxine   88 mcg Oral QAC breakfast   lidocaine   1 patch Transdermal Q24H   magnesium  oxide  400 mg Oral BID   rosuvastatin   20 mg Oral QHS   sertraline   100 mg Oral Daily   timolol   1 drop Right Eye BID   Continuous Infusions:  calcium  gluconate       LOS: 0 days    Time spent: 51 minutes    Shulem Mader  Raenelle, MD [facility]

## 2024-05-26 LAB — CORTISOL-AM, BLOOD: Cortisol - AM: 12.6 ug/dL (ref 6.7–22.6)

## 2024-05-26 LAB — CALCIUM
Calcium: 7 mg/dL — ABNORMAL LOW (ref 8.9–10.3)
Calcium: 7.6 mg/dL — ABNORMAL LOW (ref 8.9–10.3)
Calcium: 8.4 mg/dL — ABNORMAL LOW (ref 8.9–10.3)

## 2024-05-26 MED ORDER — CALCIUM GLUCONATE-NACL 1-0.675 GM/50ML-% IV SOLN: 1.0000 g | INTRAVENOUS | Status: DC | PRN

## 2024-05-26 MED ORDER — ALUM & MAG HYDROXIDE-SIMETH 200-200-20 MG/5ML PO SUSP
30.0000 mL | Freq: Four times a day (QID) | ORAL | Status: DC | PRN
  Administered 2024-05-26 – 2024-05-27 (×2): 30 mL via ORAL
  Filled 2024-05-26 (×2): qty 30

## 2024-05-26 MED ORDER — CALCIUM GLUCONATE-NACL 2-0.675 GM/100ML-% IV SOLN
2.0000 g | Freq: Once | INTRAVENOUS | Status: AC
  Administered 2024-05-26: 09:00:00 2000 mg via INTRAVENOUS
  Filled 2024-05-26: qty 100

## 2024-05-26 MED ORDER — CALCITRIOL 0.5 MCG PO CAPS
0.5000 ug | ORAL_CAPSULE | Freq: Two times a day (BID) | ORAL | Status: DC
  Administered 2024-05-26 – 2024-05-31 (×10): 0.5 ug via ORAL
  Filled 2024-05-26 (×10): qty 1

## 2024-05-26 MED ORDER — MELATONIN 5 MG PO TABS
5.0000 mg | ORAL_TABLET | Freq: Every evening | ORAL | Status: AC | PRN
  Administered 2024-05-26 – 2024-05-28 (×3): 5 mg via ORAL
  Filled 2024-05-26 (×3): qty 1

## 2024-05-26 MED ORDER — CALCIUM CARBONATE 1250 (500 CA) MG PO TABS
2.0000 | ORAL_TABLET | Freq: Three times a day (TID) | ORAL | Status: DC
  Administered 2024-05-26 – 2024-05-27 (×4): 2500 mg via ORAL
  Filled 2024-05-26 (×5): qty 2

## 2024-05-26 MED ORDER — CALCITRIOL 0.25 MCG PO CAPS
0.2500 ug | ORAL_CAPSULE | ORAL | Status: AC
  Administered 2024-05-26: 12:00:00 0.25 ug via ORAL
  Filled 2024-05-26: qty 1

## 2024-05-26 NOTE — Plan of Care (Signed)

## 2024-05-26 NOTE — Progress Notes (Signed)
 Occupational Therapy Treatment Patient Details Name: Lynn Jenkins MRN: [id number] DOB: 28-Aug-1945 Today's Date: 05/26/2024   History of present illness Lynn Jenkins is a 78 y.o. female who presented to [facility] ED with generalized weakness, dyspnea, diarrhea, facial, hand and lower extremity numbness sensation. Dx with  Hyperparathyroidism, Hypocalcemia,Hypokalemia, Hypomagnesemia  PMH: normocytic anemia, osteoarthritis, CKD, general anxiety disorder, GERD, hiatal hernia, glaucoma, migraine headache, hypothyroidism, unspecified lupus, mixed hyperlipidemia, multinodular goiter, osteopenia, history of pneumonia, varicose vein, vitamin D  deficiency, primary   OT comments  Making steady progress toward goals. Able to ambulate @ 150 ft using her rollator and requires min A for LB ADL due to generalized weakness. Pt unsteady at times with rollator and recommend initially using her RW to give her more stability. Began education regarding energy conservation and strategies to reduce risk of falls.Pt expressing concerns over worsening anxiety and worry since her acute illness combined with her husband recently passing away 3 months ago. Nsg made aware of pt's question regarding the possibility of increasing her anxiety medication and a consult placed with spiritual care to give support during her hospitalization. Continue to recommend HHOT at DC. Acute OT to follow. Pt very appreciative.       If plan is discharge home, recommend the following:  A little help with bathing/dressing/bathroom;Assistance with cooking/housework;Assist for transportation;Help with stairs or ramp for entrance;A little help with walking and/or transfers   Equipment Recommendations  BSC/3in1    Recommendations for Other Services      Precautions / Restrictions Precautions Precautions: Fall Precaution/Restrictions Comments: recent parathy sg, Restrictions Weight Bearing Restrictions Per Provider Order: No       Mobility Bed  Mobility Overal bed mobility:  (OOB in chair)                  Transfers Overall transfer level: Needs assistance Equipment used: Rolling walker (2 wheels) Transfers: Sit to/from Stand Sit to Stand: Contact guard assist           General transfer comment: stood with CGA. At times requires min A during to unsteadiness with mobility, especially during turns; rollator seems to get away from me at times     Balance Overall balance assessment: Needs assistance Sitting-balance support: Feet supported, No upper extremity supported Sitting balance-Leahy Scale: Good     Standing balance support: Reliant on assistive device for balance, During functional activity, Bilateral upper extremity supported Standing balance-Leahy Scale: Poor                             ADL either performed or assessed with clinical judgement   ADL Overall ADL's : Needs assistance/impaired Eating/Feeding: Independent   Grooming: Set up   Upper Body Bathing: Set up;Sitting   Lower Body Bathing: Minimal assistance;Sit to/from stand   Upper Body Dressing : Set up;Sitting   Lower Body Dressing: Contact guard assist   Toilet Transfer: Ambulation;Minimal assistance   Toileting- Clothing Manipulation and Hygiene: Contact guard assist;Sit to/from stand - pt walking in hallway and started rushing to room due to need to have a BM; has had accidents at times; would benefit form education regarding incontinence management       Functional mobility during ADLs: Minimal assistance;Rolling walker (2 wheels);Cueing for safety General ADL Comments: Encouraged PLB during activity; would benefit from education on BORG for activity management; would likely benefit from AE    Extremity/Trunk Assessment Upper Extremity Assessment Upper Extremity Assessment: Generalized weakness   Lower  Extremity Assessment Lower Extremity Assessment: Defer to PT evaluation        Vision   Vision Assessment?:  Wears glasses for reading;Wears glasses for driving Additional Comments: complains of low vision difficulty; would benefit from low vision resources; still drives  Perception     Praxis     Communication Communication Communication: No apparent difficulties   Cognition Arousal: Alert Behavior During Therapy: WFL for tasks assessed/performed Cognition: No apparent impairments                               Following commands: Intact        Cueing      Exercises Exercises: Other exercises Other Exercises Other Exercises: pursed lip breathing    Shoulder Instructions       General Comments discussed increased difficulty with anxiety and worry since recent surgery adn passing of her husband. 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    Pertinent Vitals/ Pain       Pain Assessment Pain Assessment: Faces Faces Pain Scale: Hurts even more Breathing: normal Negative Vocalization: none Facial Expression: smiling or inexpressive Body Language: relaxed Pain Location: back, neck and R side Pain Descriptors / Indicators: Aching, Sore Pain Intervention(s): Limited activity within patient's tolerance  Home Living                                          Prior Functioning/Environment              Frequency  Min 2X/week        Progress Toward Goals  OT Goals(current goals can now be found in the care plan section)  Progress towards OT goals: Progressing toward goals  Acute Rehab OT Goals Patient Stated Goal: better anxiety management OT Goal Formulation: With patient Time For Goal Achievement: 06/07/24 Potential to Achieve Goals: Good ADL Goals Pt Will Perform Lower Body Bathing: with set-up;sit to/from stand;with adaptive equipment Pt Will Perform Lower Body Dressing: with supervision;sit to/from stand;with adaptive equipment Pt Will Transfer to Toilet: with supervision;ambulating;bedside commode Pt  Will Perform Toileting - Clothing Manipulation and hygiene: with set-up;sit to/from stand Pt Will Perform Tub/Shower Transfer: Shower transfer;shower seat;rolling walker;with supervision Pt/caregiver will Perform Home Exercise Program: Increased strength;Both right and left upper extremity;With written HEP provided;Independently Additional ADL Goal #1: Patient will teach back ECT's for BADL's and mobility indep  Plan      Co-evaluation                 AM-PAC OT 6 Clicks Daily Activity     Outcome Measure   Help from another person eating meals?: None Help from another person taking care of personal grooming?: A Little Help from another person toileting, which includes using toliet, bedpan, or urinal?: A Little Help from another person bathing (including washing, rinsing, drying)?: A Little Help from another person to put on and taking off regular upper body clothing?: A Little Help from another person to put on and taking off regular lower body clothing?: A Little 6 Click Score: 19    End of Session Equipment Utilized During Treatment: Gait belt;Rollator (4 wheels)  OT Visit Diagnosis: Unsteadiness on feet (R26.81);History of falling (Z91.81);Pain Pain - part of body:  (back)   Activity Tolerance Patient limited by fatigue   Patient Left Other (  comment) (in bathroom; NT notified)   Nurse Communication Mobility status;Other (comment) (pt expressing concerns)        Time: 8342-8282 OT Time Calculation (min): 20 min  Charges: OT General Charges $OT Visit: 1 Visit OT Treatments $Self Care/Home Management : 8-22 mins  Kreg Sink, OT/L   Acute OT Clinical Specialist Acute Rehabilitation Services Pager [phone] Office [phone]   [facility] 05/26/2024, 5:35 PM

## 2024-05-26 NOTE — Progress Notes (Signed)
 PROGRESS NOTE    Lynn Jenkins  FMW:[id number] DOB: [birth year] DOA: 05/23/2024 PCP: Okey Carlin Redbird, MD    Brief Narrative:  78 year old with history of generalized anxiety disorder, GERD, hypothyroidism, hyperlipidemia, vitamin D  deficiency, primary hypothyroidism status post total parathyroidectomy 6 days ago presented to the emergency room with generalized weakness, shortness of breath, diarrhea and extremity numbness sensation.  According to the patient, she had persistent vomiting and difficulty tolerating diet after surgery.  Calcium  was 11 on discharge. In the emergency room afebrile.  On room air.  Blood pressure adequate.  Calcium  was 7.5, magnesium  was 1.6.  Patient was admitted due to significant symptoms.  Was given calcium  gluconate.  Subjective:  Patient seen and examined.  On my interview, patient was tearful, anxious with tingling of the hands and legs, she was also feeling tingling of the face.  Morning calcium  was 7.  Patient hemodynamically stable. Patient was given a stat dose of calcium  gluconate 2 g, once she was started on calcium  gluconate infusion patient felt better and symptoms improved.  Remains persistently hypocalcemic despite high dose of oral replacement.  Will titrate further doses of oral calcium  before discharge.  Assessment & Plan:   Symptomatic hypocalcemia after total parathyroidectomy: Persistently symptomatic.  Hemodynamically stable. Calcium  gluconate 2 g stat today for calcium  of 7. On calcium  carbonate, changing to 2500 mg 3 times daily today.  Increase calcitriol  to 50 mcg twice daily. Check calcium  every 8 hours.  A.m. cortisol levels today. Calcium  gluconate as needed for symptoms. Magnesium  supplementation.  Hypokalemia/hypomagnesemia: Replaced.  Will keep on scheduled magnesium .  Will prescribe on discharge.  Chronic medical issues including Hypothyroidism, on Synthroid  continue Hyperlipidemia, on a statin continue CKD stage IIIa,  is stable.  Continue. Anxiety, on sertraline  continue     DVT prophylaxis: SCDs Start: 05/23/24 1434   Code Status: Full code Family Communication: None at the bedside Disposition Plan: Status is: Inpatient.  Severe symptomatic hypocalcemia.   Consultants:  None  Procedures:  None  Antimicrobials:  None     Objective: Vitals:   05/25/24 1514 05/25/24 1923 05/26/24 0532 05/26/24 0905  BP: (!) 162/85 (!) 171/95 133/87 (!) 155/91  Pulse: 65 65 65 68  Resp:  16 18 18   Temp:  99.1 F (37.3 C) 98.9 F (37.2 C) 98.6 F (37 C)  TempSrc:  Oral Oral Oral  SpO2:    97%  Weight:      Height:        Intake/Output Summary (Last 24 hours) at 05/26/2024 1121 Last data filed at 05/26/2024 0929 Gross per 24 hour  Intake 290 ml  Output 0 ml  Net 290 ml   Filed Weights   05/23/24 1522  Weight: 90.5 kg    Examination:  General exam: Anxious with hypocalcemic symptoms.  Tearful.  Improved after giving calcium . Respiratory system: Clear to auscultation. Respiratory effort normal. Cardiovascular system: S1 & S2 heard, RRR. No JVD, murmurs, rubs, gallops or clicks. No pedal edema. Gastrointestinal system: Abdomen is nondistended, soft and nontender. No organomegaly or masses felt. Normal bowel sounds heard. Central nervous system: Alert and oriented. No focal neurological deficits. Extremities: Symmetric 5 x 5 power.    Data Reviewed: I have personally reviewed following labs and imaging studies  CBC: Recent Labs  Lab 05/23/24 1133 05/24/24 0416 05/25/24 0536  WBC 13.3* 9.8 7.8  NEUTROABS  --   --  5.4  HGB 12.5 11.6* 11.8*  HCT 38.7 35.7* 37.0  MCV 88.2 88.1 90.5  PLT 189 192 186   Basic Metabolic Panel: Recent Labs  Lab 05/23/24 1133 05/23/24 1554 05/23/24 2107 05/24/24 0416 05/24/24 0417 05/24/24 1708 05/25/24 0536 05/25/24 1712 05/26/24 0621  NA 141  --   --   --  142  --  143  --   --   K 3.4*  --   --   --  3.8  --  3.6  --   --   CL 101  --    --   --  106  --  106  --   --   CO2 24  --   --   --  23  --  25  --   --   GLUCOSE 141*  --   --   --  127*  --  110*  --   --   BUN 21  --   --   --  18  --  21  --   --   CREATININE 1.24*  --   --   --  1.13*  --  1.14*  --   --   CALCIUM  7.5* 7.5*   < >  --  7.8* 7.6* 7.2* 7.7* 7.0*  MG 1.6*  --   --  1.8  --   --  1.7  --   --   PHOS  --  2.5  --   --   --   --  4.2  --   --    < > = values in this interval not displayed.   GFR: Estimated Creatinine Clearance: 45 mL/min (A) (by C-G formula based on SCr of 1.14 mg/dL (H)). Liver Function Tests: Recent Labs  Lab 05/23/24 1133 05/24/24 0417 05/25/24 0536  AST 27 36 36  ALT 20 24 32  ALKPHOS 163* 162* 212*  BILITOT 1.5* 1.1 0.5  PROT 7.8 7.0 7.0  ALBUMIN 3.9 3.4* 3.4*   No results for input(s): LIPASE, AMYLASE in the last 168 hours. No results for input(s): AMMONIA in the last 168 hours. Coagulation Profile: No results for input(s): INR, PROTIME in the last 168 hours. Cardiac Enzymes: No results for input(s): CKTOTAL, CKMB, CKMBINDEX, TROPONINI in the last 168 hours. BNP (last 3 results) No results for input(s): PROBNP in the last 8760 hours. HbA1C: No results for input(s): HGBA1C in the last 72 hours. CBG: Recent Labs  Lab 05/23/24 1227  GLUCAP 126*   Lipid Profile: No results for input(s): CHOL, HDL, LDLCALC, TRIG, CHOLHDL, LDLDIRECT in the last 72 hours. Thyroid  Function Tests: No results for input(s): TSH, T4TOTAL, FREET4, T3FREE, THYROIDAB in the last 72 hours. Anemia Panel: No results for input(s): VITAMINB12, FOLATE, FERRITIN, TIBC, IRON, RETICCTPCT in the last 72 hours. Sepsis Labs: No results for input(s): PROCALCITON, LATICACIDVEN in the last 168 hours.  No results found for this or any previous visit (from the past 240 hours).       Radiology Studies: No results found.      Scheduled Meds:  artificial tears  1 drop Both Eyes BID    calcitRIOL   0.25 mcg Oral NOW   calcitRIOL   0.5 mcg Oral BID   calcium  carbonate  2 tablet Oral TID WC   latanoprost   1 drop Right Eye QHS   levothyroxine   88 mcg Oral QAC breakfast   lidocaine   1 patch Transdermal Q24H   magnesium  oxide  400 mg Oral BID   rosuvastatin   20 mg Oral QHS   sertraline   100 mg  Oral Daily   timolol   1 drop Right Eye BID   Continuous Infusions:     LOS: 1 day    Time spent: 51 minutes    Renato Applebaum, MD [facility]

## 2024-05-27 LAB — CALCIUM
Calcium: 7.2 mg/dL — ABNORMAL LOW (ref 8.9–10.3)
Calcium: 7.7 mg/dL — ABNORMAL LOW (ref 8.9–10.3)

## 2024-05-27 MED ORDER — CALCIUM GLUCONATE-NACL 1-0.675 GM/50ML-% IV SOLN
1.0000 g | Freq: Once | INTRAVENOUS | Status: AC
  Administered 2024-05-27: 10:00:00 1000 mg via INTRAVENOUS
  Filled 2024-05-27: qty 50

## 2024-05-27 MED ORDER — CALCIUM CARBONATE 1250 (500 CA) MG PO TABS
2.0000 | ORAL_TABLET | Freq: Three times a day (TID) | ORAL | Status: DC
  Administered 2024-05-27 – 2024-05-28 (×3): 2500 mg via ORAL
  Filled 2024-05-27 (×3): qty 2

## 2024-05-27 MED ORDER — TRAZODONE HCL 50 MG PO TABS
50.0000 mg | ORAL_TABLET | Freq: Once | ORAL | Status: AC
  Administered 2024-05-27: 50 mg via ORAL
  Filled 2024-05-27: qty 1

## 2024-05-27 MED ORDER — CALCIUM GLUCONATE-NACL 1-0.675 GM/50ML-% IV SOLN
1.0000 g | Freq: Once | INTRAVENOUS | Status: AC
  Administered 2024-05-27: 16:00:00 1000 mg via INTRAVENOUS
  Filled 2024-05-27 (×2): qty 50

## 2024-05-27 MED ORDER — CALCIUM GLUCONATE-NACL 2-0.675 GM/100ML-% IV SOLN
2.0000 g | Freq: Four times a day (QID) | INTRAVENOUS | Status: DC
  Administered 2024-05-27 – 2024-05-28 (×3): 2000 mg via INTRAVENOUS
  Filled 2024-05-27 (×4): qty 100

## 2024-05-27 NOTE — Progress Notes (Signed)
 Mobility Specialist Progress Note:   05/27/24 1512  Mobility  Activity  (Chair Exercises)  Level of Assistance Independent  Assistive Device None  Range of Motion/Exercises Active  Activity Response Tolerated well  Mobility Referral Yes  Mobility visit 1 Mobility  Mobility Specialist Start Time (ACUTE ONLY) 1422  Mobility Specialist Stop Time (ACUTE ONLY) 1434  Mobility Specialist Time Calculation (min) (ACUTE ONLY) 12 min   Pt was received in recliner and agreed to chair mobility: Seated BLE Exercises:  1) Knee Extension (hold 3 seconds): 1 x 10  2) Marching: 1 x 10  3) Hip Adduction (pillow squeezes): 1 x 10 4) Ankle Pumps: 1 x 10 Pt had no complaints during session. Returned to recliner with all needs met and call bell in reach.  [employer] - Mobility Specialist

## 2024-05-27 NOTE — Consult Note (Addendum)
 Renal Service Consult Note [facility] [name], MD  Patient: Lynn Jenkins Date: 05/27/2024 Requesting Physician: Dr. LOIS Applebaum  Reason for Consult: Hypocalcemia after parathyroid  surgery HPI: The patient is a 78 y.o. year-old w/ PMH as below who presented to ED on 12/14 c/o lightheadness, numbness in face, legs, hand, incontinent and weak. She had parathyroidectomy (her 3rd surgery in 3 years for this) recently on 05/17/24. Labs showed Calcium  7.5 (pre-op Ca+ 11.0 on 05/18/24).    Pt seen in room. Symptoms as above.   Rocaltrol - 12/16 0.25 bid -> now 0.5 mcg bid Calc carbonate (elemental dosing):  - started 400mg  elemental Ca tid (1.2 gm/ d) on 12/14  - on 12/17 dose increased to 2500mg  tid (7.5 gm/ d) on 12/17  - got 1 dose 50,000 vit D (ergocalciferol ) on 12/14  - IV Ca gluconate given    - 1 gm x 3 on 12/14   - 1 gm on 12/16, 2gm on 12/17 and 1 gm on 12/18  Dr Eletha notes say she had 2 prior neck explorations w/ a R sided parathyroidectomy as her initial procedure. After that she had persistent hyperparathyroidism w/ Ca++ levels around 11.0.  US  in Oct 2023 showed surgical absence of the L thyroid  lobe and stable nodules in the R thyroid  lobe. She continues to have symptoms including joint pains, osteoporosis and chronic fatigue. Dr Eletha operated on 12/09 doing a R inferior parathyroidectomy and resection of a thyroid  nodule. Pt was dc'd on 12/10 then returned to ED on 12/14 as above.     ROS - denies CP, no joint pain, no HA, no blurry vision, no rash, no diarrhea, no nausea/ vomiting   Past Medical History  Past Medical History:  Diagnosis Date   Anemia    Arthritis    Chronic kidney disease    Generalized anxiety disorder    GERD (gastroesophageal reflux disease)    Glaucoma    Headache    hx of migraines    History of hiatal hernia    Hyperthyroidism    Lupus    Migraine    Mixed hyperlipidemia    Multinodular goiter    Osteopenia     Pneumonia    Primary hyperparathyroidism    Varicose veins    Vitamin D  deficiency    Past Surgical History  Past Surgical History:  Procedure Laterality Date   ABDOMINAL HYSTERECTOMY     APPENDECTOMY     CHOLECYSTECTOMY N/A 03/11/2022   Procedure: LAPAROSCOPIC CHOLECYSTECTOMY WITH icg dye;  Surgeon: Signe Mitzie LABOR, MD;  Location: [facility];  Service: General;  Laterality: N/A;   COLONOSCOPY N/A 04/06/2024   Procedure: COLONOSCOPY;  Surgeon: Dianna Specking, MD;  Location: [facility];  Service: Gastroenterology;  Laterality: N/A;   EYE SURGERY     FRACTURE SURGERY Right 2023   knee   INSERTION OF MESH  10/15/2022   Procedure: INSERTION OF MESH;  Surgeon: Leos Calamity, MD;  [facility] OR;  Service: General;;   left foot surgery      PARATHYROIDECTOMY N/A 11/01/2014   Procedure: NECK EXPLORATION AND PARATHYROIDECTOMY;  Surgeon: Krystal Eletha, MD;  Location: [facility];  Service: General;  Laterality: N/A;   PARATHYROIDECTOMY N/A 10/31/2020   Procedure: PARATHYROIDECTOMY WITH LEFT THYROID  LOBECTOMY;  Surgeon: Eletha Krystal, MD;  Location: [facility];  Service: General;  Laterality: N/A;   PARATHYROIDECTOMY N/A 05/17/2024   Procedure: PARATHYROIDECTOMY;  Surgeon: Eletha Krystal, MD;  Location: [facility];  Service: General;  Laterality:  N/A;  NECK EXPLORATION WITH PARATHYROIDECTOMY   SHOULDER SURGERY     XI ROBOTIC ASSISTED HIATAL HERNIA REPAIR N/A 10/15/2022   Procedure: XI ROBOTIC ASSISTED HIATAL HERNIA REPAIR WITH FUNDOPLICATION;  Surgeon: Rubin Calamity, MD;  Location: [facility] OR;  Service: General;  Laterality: N/A;   Family History  Family History  Problem Relation Age of Onset   Heart disease Mother    Hyperlipidemia Mother    Hypertension Mother    Heart disease Father    Hyperlipidemia Father    Hypertension Father    Cancer Brother    Hypertension Brother    Social History  reports that she has never smoked. She has never used smokeless tobacco. She reports that she does not drink  alcohol  and does not use drugs. Allergies Allergies[1] Home medications Prior to Admission medications  Medication Sig Start Date End Date Taking? Authorizing Provider  acetaminophen  (TYLENOL ) 500 MG tablet Take 500-1,000 mg by mouth every 6 (six) hours as needed for mild pain or moderate pain.   Yes [provider]  furosemide  (LASIX ) 20 MG tablet Take 20 mg by mouth daily as needed for fluid or edema. 08/26/18  Yes [provider]  levothyroxine  (SYNTHROID ) 88 MCG tablet Take 88 mcg by mouth daily before breakfast. 03/06/22  Yes [provider]  loperamide  (IMODIUM  A-D) 2 MG tablet Take 2 mg by mouth 4 (four) times daily as needed for diarrhea or loose stools.   Yes [provider]  metoCLOPramide  (REGLAN ) 10 MG tablet Take 1 tablet (10 mg total) by mouth every 6 (six) hours as needed for nausea or vomiting. Patient taking differently: Take 5-10 mg by mouth See admin instructions. Take one tablet by mouth up to 4 times a day 11/14/19  Yes Towana Ozell BROCKS, MD  Multiple Vitamin (MULTIVITAMIN WITH MINERALS) TABS tablet Take 1 tablet by mouth daily.   Yes [provider]  nitroGLYCERIN  (NITROSTAT ) 0.4 MG SL tablet Place 1 tablet (0.4 mg total) under the tongue every 5 (five) minutes as needed for chest pain. Max 3 tablets per event. 12/31/23  Yes Patwardhan, Manish J, MD  REFRESH PLUS 0.5 % SOLN Place 1 drop into both eyes 2 (two) times daily.   Yes [provider]  rosuvastatin  (CRESTOR ) 20 MG tablet Take 1 tablet (20 mg total) by mouth daily. 12/31/23  Yes Patwardhan, Manish J, MD  sertraline  (ZOLOFT ) 100 MG tablet Take 100 mg by mouth daily.   Yes [provider]  timolol  (TIMOPTIC ) 0.5 % ophthalmic solution Place 1 drop into the right eye 2 (two) times daily.   Yes [provider]  XALATAN  0.005 % ophthalmic solution Place 1 drop into the right eye at bedtime. 03/18/15  Yes [provider]  ciclopirox  (PENLAC ) 8 %  solution Apply topically at bedtime. Apply over nail and surrounding skin. Apply daily over previous coat. After seven (7) days, may remove with alcohol  and continue cycle. Patient not taking: Reported on 05/23/2024 09/01/23   Gershon Donnice SAUNDERS, DPM  ondansetron  (ZOFRAN -ODT) 4 MG disintegrating tablet 4mg  ODT q4 hours prn nausea/vomit Patient not taking: Reported on 05/23/2024 08/14/22   Patt Alm Macho, MD     Vitals:   05/26/24 1211 05/26/24 2012 05/27/24 0456 05/27/24 1213  BP: 134/84 (!) 156/84 (!) 142/75 (!) 141/83  Pulse: 64 62 67 (!) 58  Resp: 18 18 17 18   Temp: 98.2 F (36.8 C) 98.5 F (36.9 C) 97.8 F (36.6 C) 98.4 F (36.9 C)  TempSrc:  Oral Oral   SpO2: 92% 100% 100%   Weight:      Height:       Exam Gen alert, no distress Sclera anicteric, throat clear  Neck w/ anterior bruising No jvd or bruits Chest clear bilat to bases RRR no MRG Abd soft ntnd no mass or ascites +bs Ext no LE or UE edema, no other edema Neuro is alert, Ox 3 , nf      Home bp meds: Lasix    Assessment/ Plan: 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 Pt was admitted 4 days after her surgery which was done on 12/08. She is mildly symptomatic at this time, mains symptoms are heaviness/ numbness of arms, legs, face.  Her rocaltrol  dosing is good. Her elemental po dosing is very good, just raised up to 2.5 gm elemental Ca tid. We should give it between meals though because dietary phos will bind some of the calcium .  She could get higher doses of IV Calc gluconate which I will start today ( IV 2 gm every 6 hrs). I think that the recently increased dose of elemental po CaCO3 (from 1.2 gm/ d to 7.5 gm/d) should kick in perhaps in 1-2 days.  I think her symptomatic Ca level is around 7.5, so we should shoot for stable Ca (> 9.0 perhaps) while taking only oral medications. This can take 5- 10 days in the hospital to settle out usually. Will follow.   Lynn Fret   MD [facility] 05/27/2024, 3:04 PM  Recent Labs  Lab 05/23/24 1554 05/23/24 2107 05/24/24 0416 05/24/24 0417 05/24/24 1708 05/25/24 0536 05/25/24 1712 05/26/24 2037 05/27/24 0556  HGB  --   --  11.6*  --   --  11.8*  --   --   --   ALBUMIN  --   --   --  3.4*  --  3.4*  --   --   --   CALCIUM  7.5*   < >  --  7.8*   < > 7.2*   < > 8.4* 7.2*  PHOS 2.5  --   --   --   --  4.2  --   --   --   CREATININE  --   --   --  1.13*  --  1.14*  --   --   --   K  --   --   --  3.8  --  3.6  --   --   --    < > = values in this interval not displayed.   Inpatient medications:  artificial tears  1 drop Both Eyes BID   calcitRIOL   0.5 mcg Oral BID   calcium  carbonate  2 tablet Oral TID WC   latanoprost   1 drop Right Eye QHS   levothyroxine   88 mcg Oral QAC breakfast   lidocaine   1 patch Transdermal Q24H   magnesium  oxide  400 mg Oral BID   rosuvastatin   20 mg Oral QHS   sertraline   100 mg Oral Daily   timolol   1 drop Right Eye BID    calcium  gluconate     acetaminophen  **OR** acetaminophen , alum & mag hydroxide-simeth, loperamide , melatonin, metoCLOPramide , ondansetron  **OR** ondansetron  (ZOFRAN ) IV, traMADol       [1]  Allergies Allergen Reactions   Doxycycline Itching, Swelling and Other (See Comments)    Facial swelling, itching   Morphine And Codeine Swelling   Zomig [Zolmitriptan] Nausea And Vomiting    Increased heart  rate    Augmentin  [Amoxicillin -Pot Clavulanate] Diarrhea   Ceftin [Cefuroxime Axetil] Other (See Comments)    Stomach ache   Gabapentin Other (See Comments)    Confusion, off balance   Ferrous Sulfate  Diarrhea   Methazolamide Diarrhea   Biaxin [Clarithromycin] Rash   Cefdinir Rash   Sumatriptan  Other (See Comments)    Can use brand Imitrex 

## 2024-05-27 NOTE — Progress Notes (Signed)
 PROGRESS NOTE    Lynn Jenkins  FMW:[id number] DOB: 07-Nov-1945 DOA: 05/23/2024 PCP: Okey Carlin Redbird, MD    Brief Narrative:  78 year old with history of generalized anxiety disorder, GERD, hypothyroidism, hyperlipidemia, vitamin D  deficiency, primary hypothyroidism status post total parathyroidectomy 6 days ago presented to the emergency room with generalized weakness, shortness of breath, diarrhea and extremity numbness sensation.  According to the patient, she had persistent vomiting and difficulty tolerating diet after surgery.  Calcium  was 11 on discharge. In the emergency room afebrile.  On room air.  Blood pressure adequate.  Calcium  was 7.5, magnesium  was 1.6.  Patient was admitted due to significant symptoms.  Was given calcium  gluconate. Patient remains in the hospital because she gets symptomatic hypocalcemia frequently.  Subjective:  Patient seen and examined.  Today she was more comfortable and was able to get out of the bed to the chair herself.  While she was talking to me, patient complained of tingling of the lips.  Calcium  7.2 in the morning.  Given 2 g calcium  gluconate.  Patient is tolerating oral calcium  and calcitriol .  She is always worried about going home and having recurrent symptoms.  Assessment & Plan:   Symptomatic hypocalcemia after total parathyroidectomy: Persistently symptomatic.  Hemodynamically stable. Calcium  gluconate 2 g repeat today for calcium  of 7.2.   On calcium  carbonate 2500 mg 3 times daily today.  Increase calcitriol  to 50 mcg twice daily. Check calcium  every 8 hours.  Cardiac shows normal. Calcium  gluconate as needed for symptoms.  2 g today. Scheduled magnesium . Due to persistent symptoms, will discuss with nephrology for any additional treatment to keep calcium  level more than 7.5.  Hypokalemia/hypomagnesemia: Replaced.  Will keep on scheduled magnesium .  Will prescribe on discharge.  Chronic medical issues including Hypothyroidism, on  Synthroid  continue Hyperlipidemia, on a statin continue CKD stage IIIa, is stable.  Continue. Anxiety, on sertraline  continue     DVT prophylaxis: SCDs Start: 05/23/24 1434   Code Status: Full code Family Communication: None at the bedside Disposition Plan: Status is: Inpatient.  Severe symptomatic hypocalcemia.   Consultants:  Nephrology  Procedures:  None  Antimicrobials:  None     Objective: Vitals:   05/26/24 0905 05/26/24 1211 05/26/24 2012 05/27/24 0456  BP: (!) 155/91 134/84 (!) 156/84 (!) 142/75  Pulse: 68 64 62 67  Resp: 18 18 18 17   Temp: 98.6 F (37 C) 98.2 F (36.8 C) 98.5 F (36.9 C) 97.8 F (36.6 C)  TempSrc: Oral  Oral Oral  SpO2: 97% 92% 100% 100%  Weight:      Height:        Intake/Output Summary (Last 24 hours) at 05/27/2024 1201 Last data filed at 05/27/2024 0930 Gross per 24 hour  Intake 480 ml  Output --  Net 480 ml   Filed Weights   05/23/24 1522  Weight: 90.5 kg    Examination:  General exam: Comfortable and interactive today.   Respiratory system: Clear to auscultation. Respiratory effort normal. Cardiovascular system: S1 & S2 heard, RRR. No JVD, murmurs, rubs, gallops or clicks. No pedal edema. Gastrointestinal system: Abdomen is nondistended, soft and nontender. No organomegaly or masses felt. Normal bowel sounds heard. Central nervous system: Alert and oriented. No focal neurological deficits. Extremities: Symmetric 5 x 5 power.    Data Reviewed: I have personally reviewed following labs and imaging studies  CBC: Recent Labs  Lab 05/23/24 1133 05/24/24 0416 05/25/24 0536  WBC 13.3* 9.8 7.8  NEUTROABS  --   --  5.4  HGB 12.5 11.6* 11.8*  HCT 38.7 35.7* 37.0  MCV 88.2 88.1 90.5  PLT 189 192 186   Basic Metabolic Panel: Recent Labs  Lab 05/23/24 1133 05/23/24 1554 05/23/24 2107 05/24/24 0416 05/24/24 0417 05/24/24 1708 05/25/24 0536 05/25/24 1712 05/26/24 0621 05/26/24 1227 05/26/24 2037  05/27/24 0556  NA 141  --   --   --  142  --  143  --   --   --   --   --   K 3.4*  --   --   --  3.8  --  3.6  --   --   --   --   --   CL 101  --   --   --  106  --  106  --   --   --   --   --   CO2 24  --   --   --  23  --  25  --   --   --   --   --   GLUCOSE 141*  --   --   --  127*  --  110*  --   --   --   --   --   BUN 21  --   --   --  18  --  21  --   --   --   --   --   CREATININE 1.24*  --   --   --  1.13*  --  1.14*  --   --   --   --   --   CALCIUM  7.5* 7.5*   < >  --  7.8*   < > 7.2* 7.7* 7.0* 7.6* 8.4* 7.2*  MG 1.6*  --   --  1.8  --   --  1.7  --   --   --   --   --   PHOS  --  2.5  --   --   --   --  4.2  --   --   --   --   --    < > = values in this interval not displayed.   GFR: Estimated Creatinine Clearance: 45 mL/min (A) (by C-G formula based on SCr of 1.14 mg/dL (H)). Liver Function Tests: Recent Labs  Lab 05/23/24 1133 05/24/24 0417 05/25/24 0536  AST 27 36 36  ALT 20 24 32  ALKPHOS 163* 162* 212*  BILITOT 1.5* 1.1 0.5  PROT 7.8 7.0 7.0  ALBUMIN 3.9 3.4* 3.4*   No results for input(s): LIPASE, AMYLASE in the last 168 hours. No results for input(s): AMMONIA in the last 168 hours. Coagulation Profile: No results for input(s): INR, PROTIME in the last 168 hours. Cardiac Enzymes: No results for input(s): CKTOTAL, CKMB, CKMBINDEX, TROPONINI in the last 168 hours. BNP (last 3 results) No results for input(s): PROBNP in the last 8760 hours. HbA1C: No results for input(s): HGBA1C in the last 72 hours. CBG: Recent Labs  Lab 05/23/24 1227  GLUCAP 126*   Lipid Profile: No results for input(s): CHOL, HDL, LDLCALC, TRIG, CHOLHDL, LDLDIRECT in the last 72 hours. Thyroid  Function Tests: No results for input(s): TSH, T4TOTAL, FREET4, T3FREE, THYROIDAB in the last 72 hours. Anemia Panel: No results for input(s): VITAMINB12, FOLATE, FERRITIN, TIBC, IRON, RETICCTPCT in the last 72 hours. Sepsis  Labs: No results for input(s): PROCALCITON, LATICACIDVEN in the last 168 hours.  No results found for this or  any previous visit (from the past 240 hours).       Radiology Studies: No results found.      Scheduled Meds:  artificial tears  1 drop Both Eyes BID   calcitRIOL   0.5 mcg Oral BID   calcium  carbonate  2 tablet Oral TID WC   latanoprost   1 drop Right Eye QHS   levothyroxine   88 mcg Oral QAC breakfast   lidocaine   1 patch Transdermal Q24H   magnesium  oxide  400 mg Oral BID   rosuvastatin   20 mg Oral QHS   sertraline   100 mg Oral Daily   timolol   1 drop Right Eye BID   Continuous Infusions:  calcium  gluconate        LOS: 2 days       Renato Applebaum, MD [facility]

## 2024-05-27 NOTE — Plan of Care (Signed)

## 2024-05-27 NOTE — Progress Notes (Signed)
 Physical Therapy Treatment Patient Details Name: Lynn Jenkins MRN: [id number] DOB: 07-08-1945 Today's Date: 05/27/2024   History of Present Illness Lynn Jenkins is a 78 y.o. female who presented to [facility] ED with generalized weakness, dyspnea, diarrhea, facial, hand and lower extremity numbness sensation. Dx with  Hyperparathyroidism, Hypocalcemia,Hypokalemia, Hypomagnesemia  PMH: normocytic anemia, osteoarthritis, CKD, general anxiety disorder, GERD, hiatal hernia, glaucoma, migraine headache, hypothyroidism, unspecified lupus, mixed hyperlipidemia, multinodular goiter, osteopenia, history of pneumonia, varicose vein, vitamin D  deficiency, primary    PT Comments  Appears AxO but slow to respond and slow to process.  Stated, she lives home alone (RN stated Spouse recently passed).  Pt has 2 dogs which Neighbor's Son is carring for as Lynn Jenkins was just admitted/released for PNA.  Pt's rollator was delivered and is in room.  LPT has HH PT however, per chart review Pt prefers OP. Assisted to the bathroom.  General transfer comment: Pt required anywhere from supervision to Contact Guard but then Min Assist from lower toilet level.  Pt moving slowly and slightly groggy.  Slightly unsteady in bathroom with her turns and attempt to navigate Rollator in tight space. General Gait Details: Pt required any where from Supervision to Min Assist.  First assisted with amb to the bathroom with urgency for a BM.  Very unsteady gait with difficulty navigating Rollator around bed, unsteady trying to open bathroom door then stumble side stepping to toilet catching her her toes on back of Rollator wheels. Limited distance in hallway due to increased c/o fatigue.  Positioned in recliner to comfort.  Pt requesting to take a nap.    Pt plans to D/C back home when medically cleared.  LPT has rec HH however, per chart review Pt is requesting OP but her neighbor has PNA and was recently admitted/D/C from [facility].  Pt lives home alone.      If plan is discharge home, recommend the following: Assist for transportation;Help with stairs or ramp for entrance;A little help with bathing/dressing/bathroom;Assistance with cooking/housework   Can travel by Pension [employer] (4 wheels)    Recommendations for Other Services       Precautions / Restrictions Precautions Precaution/Restrictions Comments: recent Para Throid removal Restrictions Weight Bearing Restrictions Per Provider Order: No     Mobility  Bed Mobility               General bed mobility comments: Pt was OOB in recliner    Transfers Overall transfer level: Needs assistance Equipment used: Rollator (4 wheels) Transfers: Sit to/from Stand Sit to Stand: Supervision, Contact guard assist, Min assist           General transfer comment: Pt required anywhere from supervision to Contact Guard but then Min Assist from lower toilet level.  Pt moving slowly and slightly groggy.  Slightly unsteady in bathroom with her turns and attempt to navigate Rollator in tight space.    Ambulation/Gait Ambulation/Gait assistance: Supervision, Contact guard assist, Min assist Gait Distance (Feet): 47 Feet Assistive device: Rollator (4 wheels) Gait Pattern/deviations: Step-to pattern, Step-through pattern, Staggering right, Staggering left Gait velocity: decr     General Gait Details: Pt required any where from Supervision to Min Assist.  First assisted with amb to the bathroom with urgency for a BM.  Very unsteady gait with difficulty navigating Rollator around bed, unsteady trying to open bathroom door then stumble side stepping to toilet catchiher her toes on back of Rollator wheels.  Stairs             Wheelchair Mobility     Tilt Bed    Modified Rankin (Stroke Patients Only)       Balance                                            Communication    Cognition Arousal:  Alert Behavior During Therapy: WFL for tasks assessed/performed                           PT - Cognition Comments: Appears AxO but slow to respond and slow to process.  Stated, she lives home alone (RN stated Spouse recently passed).  Pt has 2 dogs which Neighbor's Son is carring for as Lynn Jenkins was just admitted/released for PNA.  Pt's rollator was delivered and is in room.  LPT has HH PT however, per chart review Pt prefers OP. Following commands: Intact, Impaired Following commands impaired: Follows one step commands inconsistently, Follows one step commands with increased time    Cueing Cueing Techniques: Verbal cues  Exercises      General Comments        Pertinent Vitals/Pain Pain Assessment Pain Assessment: Faces Faces Pain Scale: Hurts a little bit Pain Location: back, neck and R side Pain Descriptors / Indicators: Aching, Sore Pain Intervention(s): Monitored during session, Repositioned    Home Living                          Prior Function            PT Goals (current goals can now be found in the care plan section) Progress towards PT goals: Progressing toward goals    Frequency    Min 3X/week      PT Plan      Co-evaluation              AM-PAC PT 6 Clicks Mobility   Outcome Measure  Help needed turning from your back to your side while in a flat bed without using bedrails?: A Little Help needed moving from lying on your back to sitting on the side of a flat bed without using bedrails?: A Little Help needed moving to and from a bed to a chair (including a wheelchair)?: A Little Help needed standing up from a chair using your arms (e.g., wheelchair or bedside chair)?: A Little Help needed to walk in hospital room?: A Little Help needed climbing 3-5 steps with a railing? : A Little 6 Click Score: 18    End of Session Equipment Utilized During Treatment: Gait belt Activity Tolerance: Patient limited by fatigue Patient  left: in chair;with call bell/phone within reach;with chair alarm set Nurse Communication: Mobility status PT Visit Diagnosis: Unsteadiness on feet (R26.81);Muscle weakness (generalized) (M62.81);Pain     Time: 1124-1140 PT Time Calculation (min) (ACUTE ONLY): 16 min  Charges:    $Gait Training: 8-22 mins PT General Charges $$ ACUTE PT VISIT: 1 Visit                     Katheryn Leap  PTA Acute  Rehabilitation Services Office M-F          [phone]

## 2024-05-27 NOTE — Progress Notes (Signed)
°   05/27/24 1000  Spiritual Encounters  Type of Visit Initial  Care provided to: Patient  Reason for visit Routine spiritual support  OnCall Visit No  Spiritual Framework  Presenting Themes Meaning/purpose/sources of inspiration;Goals in life/care;Significant life change;Impactful experiences and emotions;Community and relationships  Community/Connection Limited  Needs/Challenges/Barriers Death of husband in 04-Feb-2024; Job loss; Lack of community connection (including no family in the area); health uncertainties    Chaplain responded to spiritual care consult. Pt Lynn Jenkins welcomed me into the room and shared many life stressors, including most especially the loss of her husband of 32 years, Dempsey, in 02-04-24 (see also above). Loneliness/isolation is her biggest challenge at this time. I provided supportive presence and listening, making space for her to process.   [facility] appreciated chaplain presence and welcomes any follow up visits. She enjoys talking in person and laments being detached from others through the loss of her job, along with the general detachment that permeates the culture through the advancement of technology. She also has two dogs at home, a son who lives abroad, and at least two granddaughters (one in [place], one in [place] ).  Chaplains will continue to follow and remain available as further needs arise.

## 2024-05-28 LAB — RENAL FUNCTION PANEL
Albumin: 3.4 g/dL — ABNORMAL LOW (ref 3.5–5.0)
Anion gap: 16 — ABNORMAL HIGH (ref 5–15)
BUN: 17 mg/dL (ref 8–23)
CO2: 22 mmol/L (ref 22–32)
Calcium: 8.3 mg/dL — ABNORMAL LOW (ref 8.9–10.3)
Chloride: 104 mmol/L (ref 98–111)
Creatinine, Ser: 0.9 mg/dL (ref 0.44–1.00)
GFR, Estimated: >60 mL/min
Glucose, Bld: 120 mg/dL — ABNORMAL HIGH (ref 70–99)
Phosphorus: 5.4 mg/dL — ABNORMAL HIGH (ref 2.5–4.6)
Potassium: 4.1 mmol/L (ref 3.5–5.1)
Sodium: 142 mmol/L (ref 135–145)

## 2024-05-28 LAB — CALCIUM: Calcium: 9.1 mg/dL (ref 8.9–10.3)

## 2024-05-28 MED ORDER — CALCIUM CARBONATE 1250 (500 CA) MG PO TABS
4.0000 | ORAL_TABLET | Freq: Three times a day (TID) | ORAL | Status: DC
  Administered 2024-05-28 – 2024-05-31 (×8): 5000 mg via ORAL
  Filled 2024-05-28 (×8): qty 4

## 2024-05-28 NOTE — Plan of Care (Signed)

## 2024-05-28 NOTE — Progress Notes (Signed)
 " PROGRESS NOTE    Lynn Jenkins  FMW:[id number] DOB: 11-Jul-1945 DOA: 05/23/2024 PCP: Okey Carlin Redbird, MD    Brief Narrative:  78 year old with history of generalized anxiety disorder, GERD, hypothyroidism, hyperlipidemia, vitamin D  deficiency, primary hypothyroidism status post total parathyroidectomy 6 days ago presented to the emergency room with generalized weakness, shortness of breath, diarrhea and extremity numbness sensation.  According to the patient, she had persistent vomiting and difficulty tolerating diet after surgery.  Calcium  was 11 on discharge. In the emergency room afebrile.  On room air.  Blood pressure adequate.  Calcium  was 7.5, magnesium  was 1.6.  Patient was admitted due to significant symptoms.  Was given calcium  gluconate. Patient remains in the hospital because she gets symptomatic hypocalcemia frequently.  Subjective:  Patient seen and examined.  Using bathroom.  She was without any recurrent numbness or tingling since last 24 hours.  Currently on IV calcium  gluconate every 6 hours.  Feels much comfortable today.  Assessment & Plan:   Symptomatic hypocalcemia after total parathyroidectomy: Persistently symptomatic.  Hemodynamically stable. Currently on IV calcium  gluconate supplementation.  Latest calcium  8.7. Remains on high-dose oral replacement. Scheduled magnesium . Nephrology following.  Appreciate help. Recheck calcium  twice daily.  May take some time to stabilize.  Hypokalemia/hypomagnesemia: Replaced.  Will keep on scheduled magnesium .  Will prescribe on discharge.  Chronic medical issues including Hypothyroidism, on Synthroid  continue Hyperlipidemia, on a statin continue CKD stage IIIa, is stable.  Continue. Anxiety, on sertraline  continue.  Can use trazodone  for sleep.     DVT prophylaxis: SCDs Start: 05/23/24 1434   Code Status: Full code Family Communication: None at the bedside Disposition Plan: Status is: Inpatient.  Severe  symptomatic hypocalcemia.   Consultants:  Nephrology  Procedures:  None  Antimicrobials:  None     Objective: Vitals:   05/27/24 0456 05/27/24 1213 05/27/24 2321 05/28/24 0608  BP: (!) 142/75 (!) 141/83 (!) 166/83 (!) 160/83  Pulse: 67 (!) 58 (!) 55 64  Resp: 17 18 20 20   Temp: 97.8 F (36.6 C) 98.4 F (36.9 C) 98.1 F (36.7 C) 98.5 F (36.9 C)  TempSrc: Oral     SpO2: 100%  95% 95%  Weight:      Height:        Intake/Output Summary (Last 24 hours) at 05/28/2024 1102 Last data filed at 05/28/2024 0640 Gross per 24 hour  Intake 250 ml  Output --  Net 250 ml   Filed Weights   05/23/24 1522  Weight: 90.5 kg    Examination:  General exam: Looks fairly comfortable. She has some ecchymosis in the neck. Respiratory system: Clear to auscultation.  Cardiovascular system: S1 & S2 heard, RRR. No JVD, murmurs, rubs, gallops or clicks. No pedal edema. Gastrointestinal system: Abdomen is nondistended, soft and nontender. No organomegaly or masses felt. Normal bowel sounds heard. Central nervous system: Alert and oriented. No focal neurological deficits.     Data Reviewed: I have personally reviewed following labs and imaging studies  CBC: Recent Labs  Lab 05/23/24 1133 05/24/24 0416 05/25/24 0536  WBC 13.3* 9.8 7.8  NEUTROABS  --   --  5.4  HGB 12.5 11.6* 11.8*  HCT 38.7 35.7* 37.0  MCV 88.2 88.1 90.5  PLT 189 192 186   Basic Metabolic Panel: Recent Labs  Lab 05/23/24 1133 05/23/24 1554 05/23/24 2107 05/24/24 0416 05/24/24 0417 05/24/24 1708 05/25/24 0536 05/25/24 1712 05/26/24 1227 05/26/24 2037 05/27/24 0556 05/27/24 1353 05/28/24 0840  NA 141  --   --   --  142  --  143  --   --   --   --   --  142  K 3.4*  --   --   --  3.8  --  3.6  --   --   --   --   --  4.1  CL 101  --   --   --  106  --  106  --   --   --   --   --  104  CO2 24  --   --   --  23  --  25  --   --   --   --   --  22  GLUCOSE 141*  --   --   --  127*  --  110*  --    --   --   --   --  120*  BUN 21  --   --   --  18  --  21  --   --   --   --   --  17  CREATININE 1.24*  --   --   --  1.13*  --  1.14*  --   --   --   --   --  0.90  CALCIUM  7.5* 7.5*   < >  --  7.8*   < > 7.2*   < > 7.6* 8.4* 7.2* 7.7* 8.3*  MG 1.6*  --   --  1.8  --   --  1.7  --   --   --   --   --   --   PHOS  --  2.5  --   --   --   --  4.2  --   --   --   --   --  5.4*   < > = values in this interval not displayed.   GFR: Estimated Creatinine Clearance: 57 mL/min (by C-G formula based on SCr of 0.9 mg/dL). Liver Function Tests: Recent Labs  Lab 05/23/24 1133 05/24/24 0417 05/25/24 0536 05/28/24 0840  AST 27 36 36  --   ALT 20 24 32  --   ALKPHOS 163* 162* 212*  --   BILITOT 1.5* 1.1 0.5  --   PROT 7.8 7.0 7.0  --   ALBUMIN 3.9 3.4* 3.4* 3.4*   No results for input(s): LIPASE, AMYLASE in the last 168 hours. No results for input(s): AMMONIA in the last 168 hours. Coagulation Profile: No results for input(s): INR, PROTIME in the last 168 hours. Cardiac Enzymes: No results for input(s): CKTOTAL, CKMB, CKMBINDEX, TROPONINI in the last 168 hours. BNP (last 3 results) No results for input(s): PROBNP in the last 8760 hours. HbA1C: No results for input(s): HGBA1C in the last 72 hours. CBG: Recent Labs  Lab 05/23/24 1227  GLUCAP 126*   Lipid Profile: No results for input(s): CHOL, HDL, LDLCALC, TRIG, CHOLHDL, LDLDIRECT in the last 72 hours. Thyroid  Function Tests: No results for input(s): TSH, T4TOTAL, FREET4, T3FREE, THYROIDAB in the last 72 hours. Anemia Panel: No results for input(s): VITAMINB12, FOLATE, FERRITIN, TIBC, IRON, RETICCTPCT in the last 72 hours. Sepsis Labs: No results for input(s): PROCALCITON, LATICACIDVEN in the last 168 hours.  No results found for this or any previous visit (from the past 240 hours).       Radiology Studies: No results found.      Scheduled Meds:  artificial  tears  1 drop Both Eyes BID  calcitRIOL   0.5 mcg Oral BID   calcium  carbonate  2 tablet Oral TID BM   latanoprost   1 drop Right Eye QHS   levothyroxine   88 mcg Oral QAC breakfast   lidocaine   1 patch Transdermal Q24H   magnesium  oxide  400 mg Oral BID   rosuvastatin   20 mg Oral QHS   sertraline   100 mg Oral Daily   timolol   1 drop Right Eye BID   Continuous Infusions:  calcium  gluconate 2,000 mg (05/28/24 1004)      LOS: 3 days       Renato Applebaum, MD [facility]   "

## 2024-05-28 NOTE — Progress Notes (Signed)
 Occupational Therapy Treatment Patient Details Name: Lynn Jenkins MRN: [id number] DOB: 30-Apr-1946 Today's Date: 05/28/2024   History of present illness Lynn Jenkins is a 78 y.o. female who presented to [facility] ED with generalized weakness, dyspnea, diarrhea, facial, hand and lower extremity numbness sensation. Dx with  Hyperparathyroidism, Hypocalcemia,Hypokalemia, Hypomagnesemia  PMH: normocytic anemia, osteoarthritis, CKD, general anxiety disorder, GERD, hiatal hernia, glaucoma, migraine headache, hypothyroidism, unspecified lupus, mixed hyperlipidemia, multinodular goiter, osteopenia, history of pneumonia, varicose vein, vitamin D  deficiency, primary   OT comments  Patient seen for skilled OT session this afternoon. Patient remains with fatigue and low tolerance for mobility and standing level ADL's. Used regular RW this session for increased balance and control for commode and recliner transfers with short distances in room mobility from place to place. Educated patient energy conservation, pacing and breathing integration. OT concerned patient lacks support at home for current needs and decreased activity tolerance and balance now recommending continued inpatient follow up therapy, <3 hours/day. Patient politely but adamantly stated her preference for home with therapy. Patient requires continued Acute care hospital level OT services to progress safety and functional performance and allow for discharge.        If plan is discharge home, recommend the following:  A little help with bathing/dressing/bathroom;Assistance with cooking/housework;Assist for transportation;Help with stairs or ramp for entrance;A little help with walking and/or transfers   Equipment Recommendations  BSC/3in1       Precautions / Restrictions Precautions Precautions: Fall Precaution/Restrictions Comments: recent Para Throid removal Restrictions Weight Bearing Restrictions Per Provider Order: No       Mobility Bed  Mobility Overal bed mobility: Needs Assistance Bed Mobility: Supine to Sit     Supine to sit: Supervision, HOB elevated, Used rails     General bed mobility comments: min cues for pacing    Transfers Overall transfer level: Needs assistance Equipment used: Rolling walker (2 wheels) (deferred rollator for increased control) Transfers: Sit to/from Stand, Bed to chair/wheelchair/BSC Sit to Stand: Contact guard assist     Step pivot transfers: Contact guard assist     General transfer comment: use of regular FWW for transfers and short in room amb from commode to recliner, reports fatigue and LE weakness thus limited distance tolerated with min cues for pacing and hand placement     Balance Overall balance assessment: Needs assistance Sitting-balance support: Feet supported Sitting balance-Leahy Scale: Good     Standing balance support: During functional activity, Reliant on assistive device for balance Standing balance-Leahy Scale: Poor                             ADL either performed or assessed with clinical judgement   ADL Overall ADL's : Needs assistance/impaired Eating/Feeding: Independent   Grooming: Set up;Sitting   Upper Body Bathing: Set up;Sitting   Lower Body Bathing: Minimal assistance;Sit to/from stand   Upper Body Dressing : Set up;Sitting   Lower Body Dressing: Minimal assistance;Sit to/from stand   Toilet Transfer: Contact guard assist;BSC/3in1;Rolling walker (2 wheels);Ambulation Toilet Transfer Details (indicate cue type and reason): increased time and cues for hand placement, pacing and slower position changes to ensure steadiness Toileting- Clothing Manipulation and Hygiene: Set up;Sitting/lateral lean       Functional mobility during ADLs: Contact guard assist;Rolling walker (2 wheels) (will need chair follow for increased distance due to poor tolerance) General ADL Comments: low activity tolerance, reinforced 5 P's and use of DME  for seated showers  Extremity/Trunk Assessment Upper Extremity Assessment Upper Extremity Assessment: Generalized weakness;Right hand dominant   Lower Extremity Assessment Lower Extremity Assessment: Defer to PT evaluation                 Communication Communication Communication: No apparent difficulties   Cognition Arousal: Alert Behavior During Therapy: WFL for tasks assessed/performed Cognition: No apparent impairments             OT - Cognition Comments: mild slower processing but overall WFL's                 Following commands: Impaired Following commands impaired: Follows one step commands with increased time, Follows one step commands inconsistently      Cueing   Cueing Techniques: Verbal cues  Exercises Other Exercises Other Exercises: pursed lip breathing       General Comments VSS for session: SpO2 on RA 95% and HR 62 bpm, continues to report low activity tolerance    Pertinent Vitals/ Pain       Pain Assessment Pain Assessment: Faces Faces Pain Scale: Hurts a little bit Breathing: normal Negative Vocalization: none Facial Expression: smiling or inexpressive Body Language: relaxed Pain Location: back, neck and R side Pain Descriptors / Indicators: Aching, Sore Pain Intervention(s): Monitored during session, Repositioned   Frequency  Min 2X/week        Progress Toward Goals  OT Goals(current goals can now be found in the care plan section)  Progress towards OT goals: Progressing toward goals  Acute Rehab OT Goals Patient Stated Goal: to go home to my doggies OT Goal Formulation: With patient Time For Goal Achievement: 06/07/24 Potential to Achieve Goals: Good ADL Goals Pt Will Perform Lower Body Bathing: with set-up;sit to/from stand;with adaptive equipment Pt Will Perform Lower Body Dressing: with supervision;sit to/from stand;with adaptive equipment Pt Will Transfer to Toilet: with supervision;ambulating;bedside  commode Pt Will Perform Toileting - Clothing Manipulation and hygiene: with set-up;sit to/from stand Pt Will Perform Tub/Shower Transfer: Shower transfer;shower seat;rolling walker;with supervision Pt/caregiver will Perform Home Exercise Program: Increased strength;Both right and left upper extremity;With written HEP provided;Independently Additional ADL Goal #1: Patient will teach back ECT's for BADL's and mobility indep  Plan         AM-PAC OT 6 Clicks Daily Activity     Outcome Measure   Help from another person eating meals?: None Help from another person taking care of personal grooming?: A Little Help from another person toileting, which includes using toliet, bedpan, or urinal?: A Little Help from another person bathing (including washing, rinsing, drying)?: A Little Help from another person to put on and taking off regular upper body clothing?: A Little Help from another person to put on and taking off regular lower body clothing?: A Little 6 Click Score: 19    End of Session Equipment Utilized During Treatment: Gait belt;Rolling walker (2 wheels)  OT Visit Diagnosis: Unsteadiness on feet (R26.81);History of falling (Z91.81);Pain Pain - part of body:  (back and neck)   Activity Tolerance Patient limited by fatigue   Patient Left in chair;with call bell/phone within reach;with chair alarm set   Nurse Communication Mobility status        Time: 1230-1250 OT Time Calculation (min): 20 min  Charges: OT General Charges $OT Visit: 1 Visit OT Treatments $Self Care/Home Management : 8-22 mins  Annisa Mazzarella OT/L Acute Rehabilitation Department  [phone]  05/28/2024, 1:41 PM

## 2024-05-28 NOTE — Progress Notes (Signed)
 I provided follow up support to [facility].  I provided emotional support through listening as she shared about her grief over the loss of her husband.  She didn't expect to be alone at this time in her life and she is considering what her options are, especially given her own health concerns. She expressed appreciation for having someone to talk with and for the care that she has received during her hospitalization.

## 2024-05-28 NOTE — Progress Notes (Signed)
 [facility] Progress Note  Subjective:  Seen in room Legs numb still , no buzzing though  Presentation summary: 78 y.o. year-old w/ PMH as below who presented to ED on 12/14 c/o lightheadness, numbness in face, legs, hand, incontinent and weak. She had parathyroidectomy (her 3rd surgery in 3 years for this) recently on 05/17/24. Labs showed Calcium  7.5 (pre-op Ca+ 11.0 on 05/18/24).   Rocaltrol - 12/16 0.25 bid -> now 0.5 mcg bid Calc carbonate (elemental dosing):             - started 400mg  elemental Ca tid (1.2 gm/ d) on 12/14             - on 12/17 dose increased to 1000 elemental mg tid (3 gm/ d)              - got 1 dose 50,000 vit D (ergocalciferol ) on 12/14             - IV Ca gluconate given                          - 1 gm x 3 on 12/14                         - 1 gm on 12/16, 2gm on 12/17 and 1 gm on 12/18   Dr Eletha notes say she had 2 prior neck explorations w/ a R sided parathyroidectomy as her initial procedure. After that she had persistent hyperparathyroidism w/ Ca++ levels around 11.0.  US  in Oct 2023 showed surgical absence of the L thyroid  lobe and stable nodules in the R thyroid  lobe. She continues to have symptoms including joint pains, osteoporosis and chronic fatigue. Dr Eletha operated on 12/09 doing a R inferior parathyroidectomy and resection of a thyroid  nodule. Pt was dc'd on 12/10 then returned to ED on 12/14 as above.    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 Pt was admitted 4 days after her surgery which was done on 12/08. She is still symptomatic. Her elemental po was just raised up to 1 gm elemental Ca tid. We should give it between meals though. Remains symptomatic today, Ca++ is 8-9. Will double po CaCO3 to 2 gm elemental tid between meals (= 6 gm/ d). Will give more IV Ca gluconate overnight depending on the 3 pm Ca++ level. This problem can take a good while in the hospital to settle out usually.    Myer Fret MD  [facility] 05/28/2024, 3:44 PM  Recent Labs  Lab 05/24/24 0416 05/24/24 0417 05/25/24  [id number] 05/25/24 1712 05/27/24 1353 05/28/24 0840  HGB 11.6*  --  11.8*  --   --   --   ALBUMIN  --    < > 3.4*  --   --  3.4*  CALCIUM   --    < > 7.2*   < > 7.7* 8.3*  PHOS  --   --  4.2  --   --  5.4*  CREATININE  --    < > 1.14*  --   --  0.90  K  --    < > 3.6  --   --  4.1   < > = values in this interval not displayed.   No results for input(s): IRON, TIBC, FERRITIN in the last 168 hours. Inpatient medications:  artificial tears  1 drop Both Eyes BID   calcitRIOL   0.5 mcg Oral BID   calcium  carbonate  4 tablet Oral TID BM   latanoprost   1 drop Right Eye QHS   levothyroxine   88 mcg Oral QAC breakfast   lidocaine   1 patch Transdermal Q24H   magnesium  oxide  400 mg Oral BID   rosuvastatin   20 mg Oral QHS   sertraline   100 mg Oral Daily   timolol   1 drop Right Eye BID    acetaminophen  **OR** acetaminophen , alum & mag hydroxide-simeth, loperamide , melatonin, metoCLOPramide , ondansetron  **OR** ondansetron  (ZOFRAN ) IV, traMADol 

## 2024-05-28 NOTE — Progress Notes (Signed)
 Patient ID: Lynn Jenkins, female   DOB: [birth year], 78 y.o.   MRN: [id number]      Informed of patient's admission for hypocalcemia today.  Reviewed hospital course.  Appreciate care from all.  Agree with Dr. Geralynn that most likely hungry bones after prolonged hyperparathyroidism.  Calcium  improving today with 8.3 mg/dl this AM and now 9.1 mg/dl this evening.  Will order PTH level for AM tomorrow.  Will follow up on Monday if patient remains inpatient.  Discussed wound care instructions with patient.  Krystal Spinner, MD [facility] practice Office: [phone]

## 2024-05-29 LAB — RENAL FUNCTION PANEL
Albumin: 3.4 g/dL — ABNORMAL LOW (ref 3.5–5.0)
Anion gap: 12 (ref 5–15)
BUN: 16 mg/dL (ref 8–23)
CO2: 26 mmol/L (ref 22–32)
Calcium: 8.2 mg/dL — ABNORMAL LOW (ref 8.9–10.3)
Chloride: 103 mmol/L (ref 98–111)
Creatinine, Ser: 0.96 mg/dL (ref 0.44–1.00)
GFR, Estimated: >60 mL/min
Glucose, Bld: 132 mg/dL — ABNORMAL HIGH (ref 70–99)
Phosphorus: 4.2 mg/dL (ref 2.5–4.6)
Potassium: 3.6 mmol/L (ref 3.5–5.1)
Sodium: 141 mmol/L (ref 135–145)

## 2024-05-29 LAB — CALCIUM: Calcium: 9.9 mg/dL (ref 8.9–10.3)

## 2024-05-29 MED ORDER — CALCIUM GLUCONATE-NACL 2-0.675 GM/100ML-% IV SOLN
2.0000 g | Freq: Three times a day (TID) | INTRAVENOUS | Status: AC
  Administered 2024-05-29 (×2): 2000 mg via INTRAVENOUS
  Filled 2024-05-29 (×2): qty 100

## 2024-05-29 MED ORDER — MELATONIN 5 MG PO TABS
5.0000 mg | ORAL_TABLET | Freq: Every evening | ORAL | Status: DC | PRN
  Administered 2024-05-29 – 2024-05-30 (×2): 5 mg via ORAL
  Filled 2024-05-29 (×2): qty 1

## 2024-05-29 NOTE — Progress Notes (Signed)
 [facility] Progress Note  Subjective:  Seen in room Legs feel better Yest at 3 pm Ca++ was 9.1  Presentation summary: 78 y.o. year-old w/ PMH as below who presented to ED on 12/14 c/o lightheadness, numbness in face, legs, hand, incontinent and weak. She had parathyroidectomy (her 3rd surgery in 3 years for this) recently on 05/17/24. Labs showed Calcium  7.5 (pre-op Ca+ 11.0 on 05/18/24). Patient was admitted. Since then she was given ->  - Rocaltrol - 12/16 0.25 bid -> now 0.5 mcg bid - Calc carbonate (elemental dosing):             - started 400mg  elemental Ca tid (1.2 gm/ d) on 12/14             - on 12/17 dose increased to 1000 elemental mg tid (3 gm/ d)  - got 1 dose 50,000 vit D (ergocalciferol ) on 12/14 - IV Ca gluconate given                          - 1 gm x 3 on 12/14                         - 1 gm on 12/16, 2gm on 12/17 and 1 gm on 12/18 Dr Ronold notes say she had 2 prior neck explorations w/ a R sided parathyroidectomy as her initial procedure. After that she had persistent hyperparathyroidism w/ Ca++ levels around 11.0.  US  in Oct 2023 showed surgical absence of the L thyroid  lobe and stable nodules in the R thyroid  lobe. She continued to have symptoms including joint pains, osteoporosis and chronic fatigue. Dr Eletha operated on 12/09 doing a R inferior parathyroidectomy and resection of a thyroid  nodule. Pt was dc'd on 12/10 then returned to ED on 12/14 as above for symptomatic hypocalcemia.   Vitals:   05/28/24 0608 05/28/24 1234 05/28/24 1910 05/29/24 0521  BP: (!) 160/83 (!) 157/82 (!) 177/93 (!) 150/84  Pulse: 64 (!) 57 64 72  Resp: 20 18 15 18   Temp: 98.5 F (36.9 C) 98.5 F (36.9 C) 99.3 F (37.4 C) 98.9 F (37.2 C)  TempSrc:   Oral   SpO2: 95% 95% 94% 91%  Weight:      Height:        Exam: Gen alert, no distress Sclera anicteric, throat clear  Neck w/ anterior bruising No jvd or bruits Chest clear bilat to bases RRR no MRG Abd soft  ntnd no mass or ascites +bs Ext no LE or UE edema, no other edema Neuro is alert, Ox 3 , nf       Home bp meds: Lasix      Assessment/ Plan: 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 Pt was admitted 4 days after her surgery which was done on 12/08. She was given po CaCO3 low dose, activated vit D and some IV Ca gluconate doses at 1 gm. Her elemental po CaCO3 was raised up to 1 gm elemental Ca tid (3 gm/d). She was still symptomatic when seen in consultation. I told the patient that this process of correcting her calcium  balance after PTX usually takes a good while in the hospital. Her CaCO3 was changed to between meals for better absorption. She remained symptomatic 12/19 w/ Ca++ in the 8s, so CaCO3 po was doubled to 2 gm elemental tid. (6 gm/ d). She had 2 gm IV Ca gluconate x 2 yesterday morning  and her afternoon Ca++ was 9.1. This am is down to 8.2. Legs are feeling better. Will cont 6 gm/d of elemental po calcium , and will give 1 dose of IV Ca gluconate 2gm this am and another this afternoon. Get labs in the evening and tomorrow am. Will follow.    Myer Fret MD  [facility] 05/29/2024, 7:30 AM  Recent Labs  Lab 05/24/24 0416 05/24/24 0417 05/25/24 0536 05/25/24 1712 05/28/24 0840 05/28/24 1547 05/29/24 0544  HGB 11.6*  --  11.8*  --   --   --   --   ALBUMIN  --    < > 3.4*  --  3.4*  --  3.4*  CALCIUM   --    < > 7.2*   < > 8.3* 9.1 8.2*  PHOS  --   --  4.2  --  5.4*  --  4.2  CREATININE  --    < > 1.14*  --  0.90  --  0.96  K  --    < > 3.6  --  4.1  --  3.6   < > = values in this interval not displayed.   No results for input(s): IRON, TIBC, FERRITIN in the last 168 hours. Inpatient medications:  artificial tears  1 drop Both Eyes BID   calcitRIOL   0.5 mcg Oral BID   calcium  carbonate  4 tablet Oral TID BM   latanoprost   1 drop Right Eye QHS   levothyroxine   88 mcg Oral QAC breakfast   lidocaine   1 patch Transdermal Q24H   magnesium   oxide  400 mg Oral BID   rosuvastatin   20 mg Oral QHS   sertraline   100 mg Oral Daily   timolol   1 drop Right Eye BID    acetaminophen  **OR** acetaminophen , alum & mag hydroxide-simeth, loperamide , metoCLOPramide , ondansetron  **OR** ondansetron  (ZOFRAN ) IV, traMADol 

## 2024-05-29 NOTE — Progress Notes (Addendum)
 Physical Therapy Treatment Patient Details Name: Lynn Jenkins MRN: [id number] DOB: [birth year] Today's Date: 05/29/2024   History of Present Illness Lynn Jenkins is a 78 y.o. female who presented to [facility] ED with generalized weakness, dyspnea, diarrhea, facial, hand and lower extremity numbness sensation. Dx with  Hyperparathyroidism, Hypocalcemia,Hypokalemia, Hypomagnesemia  PMH: normocytic anemia, osteoarthritis, CKD, general anxiety disorder, GERD, hiatal hernia, glaucoma, migraine headache, hypothyroidism, unspecified lupus, mixed hyperlipidemia, multinodular goiter, osteopenia, history of pneumonia, varicose vein, vitamin D  deficiency, primary    PT Comments  Patient was in bed upon arrival an agreeable to treatment session. She denied having any pain. Patient was able to perform bed mobility with supervision using handrails and with head of bed elevated. Performed a series of lower body exercises before ambulation. Patient ambulated 75 feet with FWW CGA. Patient did not demonstrate any instability or LOB. Patient was able to use a standard toilet with some set up assist from PT. Demonstrated good standing balance without UE support while performing perineal hygiene.    If plan is discharge home, recommend the following: Assist for transportation;Help with stairs or ramp for entrance;A little help with bathing/dressing/bathroom;Assistance with cooking/housework   Can travel by [employer] (4 wheels)    Recommendations for Other Services       Precautions / Restrictions Precautions Precautions: None Precaution/Restrictions Comments: recent Para Throid removal Restrictions Weight Bearing Restrictions Per Provider Order: No     Mobility  Bed Mobility Overal bed mobility: Modified Independent Bed Mobility: Supine to Sit     Supine to sit: Supervision, HOB elevated, Used rails     General bed mobility comments: no cues needed     Transfers Overall transfer level: Needs assistance Equipment used: Rolling walker (2 wheels) Transfers: Sit to/from Stand Sit to Stand: Contact guard assist   Step pivot transfers: Contact guard assist       General transfer comment: Able to complete sit to stand transfer CGA. After ambulation patient when to the bathroom and was able to complete hygiene independently.    Ambulation/Gait Ambulation/Gait assistance: Supervision, Contact guard assist Gait Distance (Feet): 75 Feet Assistive device: Rolling walker (2 wheels) Gait Pattern/deviations: Step-through pattern Gait velocity: decr     General Gait Details: Patient able to walk with FWW CGA with step through pattern but decreased speed. Demostrated safe FWW mangement and negotiation. No LOB or instability noted. After walking in the hallway patient was able to walk to the bathroom independently with set up assist from PT.   Stairs             Wheelchair Mobility     Tilt Bed    Modified Rankin (Stroke Patients Only)       Balance Overall balance assessment: Needs assistance Sitting-balance support: Feet supported Sitting balance-Leahy Scale: Good     Standing balance support: No upper extremity supported Standing balance-Leahy Scale: Good Standing balance comment: Patient able to stand with FWW infront to complete hygiene activities independently. No instability noted.                            Communication Communication Communication: No apparent difficulties  Cognition Arousal: Alert Behavior During Therapy: WFL for tasks assessed/performed   PT - Cognitive impairments: No apparent impairments                       PT - Cognition Comments:  Able to follow commands and answer questions appropriately Following commands: Intact      Cueing Cueing Techniques: Verbal cues  Exercises General Exercises - Lower Extremity Long Arc Quad: AROM, Both, 10 reps, Seated Hip  Flexion/Marching: AROM, Both, 10 reps, Seated Toe Raises: AROM, Both, 10 reps Heel Raises: AROM, Both, 10 reps    General Comments        Pertinent Vitals/Pain Pain Assessment Pain Assessment: No/denies pain Pain Score: 0-No pain Breathing: normal Negative Vocalization: none    Home Living                          Prior Function            PT Goals (current goals can now be found in the care plan section) Progress towards PT goals: Progressing toward goals    Frequency    Min 3X/week      PT Plan      Co-evaluation              AM-PAC PT 6 Clicks Mobility   Outcome Measure  Help needed turning from your back to your side while in a flat bed without using bedrails?: A Little Help needed moving from lying on your back to sitting on the side of a flat bed without using bedrails?: A Little Help needed moving to and from a bed to a chair (including a wheelchair)?: A Little Help needed standing up from a chair using your arms (e.g., wheelchair or bedside chair)?: A Little Help needed to walk in hospital room?: A Little Help needed climbing 3-5 steps with a railing? : A Little 6 Click Score: 18    End of Session Equipment Utilized During Treatment: Gait belt Activity Tolerance: Patient tolerated treatment well Patient left: in chair;with chair alarm set;with call bell/phone within reach   PT Visit Diagnosis: Unsteadiness on feet (R26.81);Muscle weakness (generalized) (M62.81)     Time: 8998-8977 PT Time Calculation (min) (ACUTE ONLY): 21 min  Charges:    $Therapeutic Activity: 8-22 mins PT General Charges $$ ACUTE PT VISIT: 1 Visit                     Kristeen Sar, PT, DPT 05/29/2024 1:56 PM    Kristeen Sar 05/29/2024, 1:56 PM

## 2024-05-29 NOTE — Plan of Care (Signed)

## 2024-05-29 NOTE — Progress Notes (Signed)
 " PROGRESS NOTE    Lynn Jenkins  FMW:[id number] DOB: 07-07-1945 DOA: 05/23/2024 PCP: Okey Carlin Redbird, MD    Brief Narrative:  78 year old with history of generalized anxiety disorder, GERD, hypothyroidism, hyperlipidemia, vitamin D  deficiency, primary hypothyroidism status post total parathyroidectomy 6 days ago presented to the emergency room with generalized weakness, shortness of breath, diarrhea and extremity numbness sensation.  According to the patient, she had persistent vomiting and difficulty tolerating diet after surgery.  Calcium  was 11 on discharge. In the emergency room afebrile.  On room air.  Blood pressure adequate.  Calcium  was 7.5, magnesium  was 1.6.  Patient was admitted due to significant symptoms.  Was given calcium  gluconate. Patient remains in the hospital because she gets symptomatic hypocalcemia frequently.  Subjective:  Patient seen and examined.  No tingling or numbness over the last 24 hours, however she has heavy legs.  Also has 2 or 3 episodes of diarrhea every day. Calcium  is responding.  8.2 today morning-additional 2 g IV calcium  gluconate prescribed.  Assessment & Plan:   Symptomatic hypocalcemia after total parathyroidectomy: Persistently symptomatic.  Hemodynamically stable. Currently on IV calcium  gluconate supplementation intermittently.  Latest calcium  8.2. Remains on high-dose oral replacement. Scheduled magnesium . Nephrology following.  Appreciate help. Recheck calcium  twice daily.  May take some time to stabilize.  Hypokalemia/hypomagnesemia: Replaced.  Will keep on scheduled magnesium .  Will prescribe on discharge.  Chronic medical issues including Hypothyroidism, on Synthroid  continue Hyperlipidemia, on a statin continue CKD stage IIIa, is stable.  Continue. Anxiety, on sertraline  continue.  Can use trazodone  for sleep.     DVT prophylaxis: SCDs Start: 05/23/24 1434   Code Status: Full code Family Communication: None at the  bedside Disposition Plan: Status is: Inpatient.  Severe symptomatic hypocalcemia.   Consultants:  Nephrology  Procedures:  None  Antimicrobials:  None     Objective: Vitals:   05/28/24 0608 05/28/24 1234 05/28/24 1910 05/29/24 0521  BP: (!) 160/83 (!) 157/82 (!) 177/93 (!) 150/84  Pulse: 64 (!) 57 64 72  Resp: 20 18 15 18   Temp: 98.5 F (36.9 C) 98.5 F (36.9 C) 99.3 F (37.4 C) 98.9 F (37.2 C)  TempSrc:   Oral   SpO2: 95% 95% 94% 91%  Weight:      Height:       No intake or output data in the 24 hours ending 05/29/24 1154  Filed Weights   05/23/24 1522  Weight: 90.5 kg    Examination:  General exam: Looks fairly comfortable.She has some ecchymosis in the neck. Respiratory system: Clear to auscultation.  Cardiovascular system: S1 & S2 heard, RRR. No JVD, murmurs, rubs, gallops or clicks. No pedal edema. Gastrointestinal system: Abdomen is nondistended, soft and nontender. No organomegaly or masses felt. Normal bowel sounds heard. Central nervous system: Alert and oriented. No focal neurological deficits.     Data Reviewed: I have personally reviewed following labs and imaging studies  CBC: Recent Labs  Lab 05/23/24 1133 05/24/24 0416 05/25/24 0536  WBC 13.3* 9.8 7.8  NEUTROABS  --   --  5.4  HGB 12.5 11.6* 11.8*  HCT 38.7 35.7* 37.0  MCV 88.2 88.1 90.5  PLT 189 192 186   Basic Metabolic Panel: Recent Labs  Lab 05/23/24 1133 05/23/24 1554 05/23/24 2107 05/24/24 0416 05/24/24 0417 05/24/24 1708 05/25/24 0536 05/25/24 1712 05/27/24 0556 05/27/24 1353 05/28/24 0840 05/28/24 1547 05/29/24 0544  NA 141  --   --   --  142  --  143  --   --   --  142  --  141  K 3.4*  --   --   --  3.8  --  3.6  --   --   --  4.1  --  3.6  CL 101  --   --   --  106  --  106  --   --   --  104  --  103  CO2 24  --   --   --  23  --  25  --   --   --  22  --  26  GLUCOSE 141*  --   --   --  127*  --  110*  --   --   --  120*  --  132*  BUN 21  --   --   --   18  --  21  --   --   --  17  --  16  CREATININE 1.24*  --   --   --  1.13*  --  1.14*  --   --   --  0.90  --  0.96  CALCIUM  7.5* 7.5*   < >  --  7.8*   < > 7.2*   < > 7.2* 7.7* 8.3* 9.1 8.2*  MG 1.6*  --   --  1.8  --   --  1.7  --   --   --   --   --   --   PHOS  --  2.5  --   --   --   --  4.2  --   --   --  5.4*  --  4.2   < > = values in this interval not displayed.   GFR: Estimated Creatinine Clearance: 53.5 mL/min (by C-G formula based on SCr of 0.96 mg/dL). Liver Function Tests: Recent Labs  Lab 05/23/24 1133 05/24/24 0417 05/25/24 0536 05/28/24 0840 05/29/24 0544  AST 27 36 36  --   --   ALT 20 24 32  --   --   ALKPHOS 163* 162* 212*  --   --   BILITOT 1.5* 1.1 0.5  --   --   PROT 7.8 7.0 7.0  --   --   ALBUMIN 3.9 3.4* 3.4* 3.4* 3.4*   No results for input(s): LIPASE, AMYLASE in the last 168 hours. No results for input(s): AMMONIA in the last 168 hours. Coagulation Profile: No results for input(s): INR, PROTIME in the last 168 hours. Cardiac Enzymes: No results for input(s): CKTOTAL, CKMB, CKMBINDEX, TROPONINI in the last 168 hours. BNP (last 3 results) No results for input(s): PROBNP in the last 8760 hours. HbA1C: No results for input(s): HGBA1C in the last 72 hours. CBG: Recent Labs  Lab 05/23/24 1227  GLUCAP 126*   Lipid Profile: No results for input(s): CHOL, HDL, LDLCALC, TRIG, CHOLHDL, LDLDIRECT in the last 72 hours. Thyroid  Function Tests: No results for input(s): TSH, T4TOTAL, FREET4, T3FREE, THYROIDAB in the last 72 hours. Anemia Panel: No results for input(s): VITAMINB12, FOLATE, FERRITIN, TIBC, IRON, RETICCTPCT in the last 72 hours. Sepsis Labs: No results for input(s): PROCALCITON, LATICACIDVEN in the last 168 hours.  No results found for this or any previous visit (from the past 240 hours).       Radiology Studies: No results found.      Scheduled Meds:  artificial tears   1 drop Both Eyes BID   calcitRIOL   0.5 mcg Oral BID   calcium  carbonate  4 tablet Oral TID BM   latanoprost   1 drop Right Eye QHS   levothyroxine   88 mcg Oral QAC breakfast   lidocaine   1 patch Transdermal Q24H   magnesium  oxide  400 mg Oral BID   rosuvastatin   20 mg Oral QHS   sertraline   100 mg Oral Daily   timolol   1 drop Right Eye BID   Continuous Infusions:  calcium  gluconate 2,000 mg (05/29/24 1122)      LOS: 4 days       Renato Applebaum, MD [facility]   "

## 2024-05-29 NOTE — Plan of Care (Signed)

## 2024-05-30 LAB — RENAL FUNCTION PANEL
Albumin: 3.5 g/dL (ref 3.5–5.0)
Anion gap: 11 (ref 5–15)
BUN: 20 mg/dL (ref 8–23)
CO2: 27 mmol/L (ref 22–32)
Calcium: 9.9 mg/dL (ref 8.9–10.3)
Chloride: 104 mmol/L (ref 98–111)
Creatinine, Ser: 0.97 mg/dL (ref 0.44–1.00)
GFR, Estimated: >60 mL/min
Glucose, Bld: 117 mg/dL — ABNORMAL HIGH (ref 70–99)
Phosphorus: 5.1 mg/dL — ABNORMAL HIGH (ref 2.5–4.6)
Potassium: 4 mmol/L (ref 3.5–5.1)
Sodium: 142 mmol/L (ref 135–145)

## 2024-05-30 LAB — CALCIUM: Calcium: 9.6 mg/dL (ref 8.9–10.3)

## 2024-05-30 LAB — PARATHYROID HORMONE, INTACT (NO CA): PTH: <6 pg/mL — ABNORMAL LOW (ref 15–65)

## 2024-05-30 NOTE — Progress Notes (Signed)
 [facility] Progress Note  Subjective:  Seen in room Legs are much better, asymptomatic  Yest at 6 pm Ca++ was 9.9 This am Ca++ is 9.9  Presentation summary: 78 y.o. year-old w/ PMH as below who presented to ED on 12/14 c/o lightheadness, numbness in face, legs, hand, incontinent and weak. She had parathyroidectomy (her 3rd surgery in 3 years for this) recently on 05/17/24. Labs showed Calcium  7.5 (pre-op Ca+ 11.0 on 05/18/24). Patient was admitted. Since then she was given ->  - Rocaltrol - 12/16 0.25 bid -> now 0.5 mcg bid - Calc carbonate (elemental dosing):             - started 400mg  elemental Ca tid (1.2 gm/ d) on 12/14             - on 12/17 dose increased to 1000 elemental mg tid (3 gm/ d)  - got 1 dose 50,000 vit D (ergocalciferol ) on 12/14 - IV Ca gluconate given                          - 1 gm x 3 on 12/14                         - 1 gm on 12/16, 2gm on 12/17 and 1 gm on 12/18 Dr Ronold notes say she had 2 prior neck explorations w/ a R sided parathyroidectomy as her initial procedure. After that she had persistent hyperparathyroidism w/ Ca++ levels around 11.0.  US  in Oct 2023 showed surgical absence of the L thyroid  lobe and stable nodules in the R thyroid  lobe. She continued to have symptoms including joint pains, osteoporosis and chronic fatigue. Dr Eletha operated on 12/09 doing a R inferior parathyroidectomy and resection of a thyroid  nodule. Pt was dc'd on 12/10 then returned to ED on 12/14 as above for symptomatic hypocalcemia.   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 Pt was admitted 4 days after her surgery which was done on 12/08. She was given po CaCO3 low dose (1.2 gm/d), activated vit D and several IV Ca gluconate doses at 1 gm. Her elemental po CaCO3 was raised up to 1 gm elemental Ca tid (3 gm/d) due to persistent symptoms. She was symptomatic when seen in consultation. The po CaCO3 was changed to between meals for better absorption. She remained symptomatic 12/19 w/ Ca++ in the 8s, so po CaCO3 was doubled to 2 gm elemental tid (6 gm/ d). Pm Ca++ was up at 9.1. On 12/20 am Ca++ was 8.2 and she rec'd 2 doses of 2 gm  IV gluconate (am and afternoon). Pm Ca++ was 9.9 and this am Ca++ is also 9.9. Symptoms have resolved. Will cont 6 gm/d of elemental po calcium  and hold IV Ca for now as Ca++ appears to be at goal (> 9.0). Will get levels at 10a and 2p today, if stable > 9 will consider dc.    Myer Fret MD  [facility] 05/30/2024, 6:51 AM  Recent Labs  Lab 05/24/24 0416 05/24/24 0417 05/25/24 0536 05/25/24 1712 05/29/24 0544 05/29/24 1802 05/30/24 0540  HGB 11.6*  --  11.8*  --   --   --   --   ALBUMIN  --    < > 3.4*   < > 3.4*  --  3.5  CALCIUM   --    < > 7.2*   < > 8.2* 9.9 9.9  PHOS  --   --  4.2   < > 4.2  --  5.1*  CREATININE  --    < > 1.14*   < > 0.96  --  0.97  K  --    < > 3.6   < > 3.6  --  4.0   < > = values in this interval not displayed.   No results for input(s): IRON, TIBC, FERRITIN in the last 168 hours. Inpatient medications:  artificial tears  1 drop Both Eyes BID   calcitRIOL   0.5 mcg Oral BID   calcium  carbonate  4 tablet Oral TID BM   latanoprost   1 drop Right Eye QHS   levothyroxine   88 mcg Oral QAC breakfast   lidocaine   1 patch Transdermal Q24H    magnesium  oxide  400 mg Oral BID   rosuvastatin   20 mg Oral QHS   sertraline   100 mg Oral Daily   timolol   1 drop Right Eye BID    acetaminophen  **OR** acetaminophen , alum & mag hydroxide-simeth, loperamide , melatonin, metoCLOPramide , ondansetron  **OR** ondansetron  (ZOFRAN ) IV, traMADol 

## 2024-05-30 NOTE — Progress Notes (Signed)
 " PROGRESS NOTE    Bea Duren  FMW:[id number] DOB: 02/27/1946 DOA: 05/23/2024 PCP: Okey Carlin Redbird, MD    Brief Narrative:  78 year old with history of generalized anxiety disorder, GERD, hypothyroidism, hyperlipidemia, vitamin D  deficiency, primary hypothyroidism status post total parathyroidectomy 6 days ago presented to the emergency room with generalized weakness, shortness of breath, diarrhea and extremity numbness sensation.  According to the patient, she had persistent vomiting and difficulty tolerating diet after surgery.  Calcium  was 11 on discharge. In the emergency room afebrile.  On room air.  Blood pressure adequate.  Calcium  was 7.5, magnesium  was 1.6.  Patient was admitted due to significant symptoms.  Was given calcium  gluconate. Patient remains in the hospital because she gets symptomatic hypocalcemia frequently.  Subjective:  Patient seen and examined.  Denies any complaints.  She was happy to know that her calcium  is staying stable now. Calcium  is responding.  Remains more than 9 last 24 hours.  Last IV calcium  was received 12/20 morning.  Assessment & Plan:   Symptomatic hypocalcemia after total parathyroidectomy: Was persistently symptomatic.  Needed multiple doses of IV calcium  due to symptoms.   Latest calcium  more than 9 x 2.   Remains on high-dose oral replacement. Scheduled magnesium . Nephrology following.  Appreciate help. Recheck calcium  twice daily.  May take some time to stabilize.  Hypokalemia/hypomagnesemia: Replaced.  Will keep on scheduled magnesium .  Will prescribe on discharge.  Chronic medical issues including Hypothyroidism, on Synthroid  continue Hyperlipidemia, on a statin continue CKD stage IIIa, is stable.  Continue. Anxiety, on sertraline  continue.  Can use trazodone  for sleep.     DVT prophylaxis: SCDs Start: 05/23/24 1434   Code Status: Full code Family Communication: None at the bedside Disposition Plan: Status is: Inpatient.   Severe symptomatic hypocalcemia.  Anticipate home tomorrow.   Consultants:  Nephrology  Procedures:  None  Antimicrobials:  None     Objective: Vitals:   05/29/24 0521 05/29/24 1156 05/29/24 1957 05/30/24 0403  BP: (!) 150/84 (!) 142/76 (!) 159/80 (!) 142/86  Pulse: 72 64 65 66  Resp: 18 15 18 14   Temp: 98.9 F (37.2 C) 98.4 F (36.9 C) 99.5 F (37.5 C) 98.6 F (37 C)  TempSrc:  Oral Oral Oral  SpO2: 91% 93% 94% 94%  Weight:      Height:        Intake/Output Summary (Last 24 hours) at 05/30/2024 1140 Last data filed at 05/30/2024 1000 Gross per 24 hour  Intake 340 ml  Output 400 ml  Net -60 ml    Filed Weights   05/23/24 1522  Weight: 90.5 kg    Examination:  General exam: Comfortable and interactive.  Pleasant. Respiratory system: Clear to auscultation.  Cardiovascular system: S1 & S2 heard, RRR. No JVD, murmurs, rubs, gallops or clicks. No pedal edema. Gastrointestinal system: Abdomen is nondistended, soft and nontender. No organomegaly or masses felt. Normal bowel sounds heard. Central nervous system: Alert and oriented. No focal neurological deficits.     Data Reviewed: I have personally reviewed following labs and imaging studies  CBC: Recent Labs  Lab 05/24/24 0416 05/25/24 0536  WBC 9.8 7.8  NEUTROABS  --  5.4  HGB 11.6* 11.8*  HCT 35.7* 37.0  MCV 88.1 90.5  PLT 192 186   Basic Metabolic Panel: Recent Labs  Lab 05/23/24 1554 05/23/24 2107 05/24/24 0416 05/24/24 0417 05/24/24 1708 05/25/24 0536 05/25/24 1712 05/28/24 0840 05/28/24 1547 05/29/24 0544 05/29/24 1802 05/30/24 0540  NA  --   --   --  142  --  143  --  142  --  141  --  142  K  --   --   --  3.8  --  3.6  --  4.1  --  3.6  --  4.0  CL  --   --   --  106  --  106  --  104  --  103  --  104  CO2  --   --   --  23  --  25  --  22  --  26  --  27  GLUCOSE  --   --   --  127*  --  110*  --  120*  --  132*  --  117*  BUN  --   --   --  18  --  21  --  17  --  16  --   20  CREATININE  --   --   --  1.13*  --  1.14*  --  0.90  --  0.96  --  0.97  CALCIUM  7.5*   < >  --  7.8*   < > 7.2*   < > 8.3* 9.1 8.2* 9.9 9.9  MG  --   --  1.8  --   --  1.7  --   --   --   --   --   --   PHOS 2.5  --   --   --   --  4.2  --  5.4*  --  4.2  --  5.1*   < > = values in this interval not displayed.   GFR: Estimated Creatinine Clearance: 52.9 mL/min (by C-G formula based on SCr of 0.97 mg/dL). Liver Function Tests: Recent Labs  Lab 05/24/24 0417 05/25/24 0536 05/28/24 0840 05/29/24 0544 05/30/24 0540  AST 36 36  --   --   --   ALT 24 32  --   --   --   ALKPHOS 162* 212*  --   --   --   BILITOT 1.1 0.5  --   --   --   PROT 7.0 7.0  --   --   --   ALBUMIN 3.4* 3.4* 3.4* 3.4* 3.5   No results for input(s): LIPASE, AMYLASE in the last 168 hours. No results for input(s): AMMONIA in the last 168 hours. Coagulation Profile: No results for input(s): INR, PROTIME in the last 168 hours. Cardiac Enzymes: No results for input(s): CKTOTAL, CKMB, CKMBINDEX, TROPONINI in the last 168 hours. BNP (last 3 results) No results for input(s): PROBNP in the last 8760 hours. HbA1C: No results for input(s): HGBA1C in the last 72 hours. CBG: Recent Labs  Lab 05/23/24 1227  GLUCAP 126*   Lipid Profile: No results for input(s): CHOL, HDL, LDLCALC, TRIG, CHOLHDL, LDLDIRECT in the last 72 hours. Thyroid  Function Tests: No results for input(s): TSH, T4TOTAL, FREET4, T3FREE, THYROIDAB in the last 72 hours. Anemia Panel: No results for input(s): VITAMINB12, FOLATE, FERRITIN, TIBC, IRON, RETICCTPCT in the last 72 hours. Sepsis Labs: No results for input(s): PROCALCITON, LATICACIDVEN in the last 168 hours.  No results found for this or any previous visit (from the past 240 hours).       Radiology Studies: No results found.      Scheduled Meds:  artificial tears  1 drop Both Eyes BID   calcitRIOL   0.5 mcg Oral  BID   calcium  carbonate  4 tablet  Oral TID BM   latanoprost   1 drop Right Eye QHS   levothyroxine   88 mcg Oral QAC breakfast   lidocaine   1 patch Transdermal Q24H   magnesium  oxide  400 mg Oral BID   rosuvastatin   20 mg Oral QHS   sertraline   100 mg Oral Daily   timolol   1 drop Right Eye BID   Continuous Infusions:      LOS: 5 days       Renato Applebaum, MD [facility]   "

## 2024-05-30 NOTE — Progress Notes (Signed)
 Follow up call placed to laboratory regarding serum calcium  result. Lab representative states sample not received. Phlebotomist contacted, and states sample collected. Lab technician states currently recovering sample to obtain result.

## 2024-05-30 NOTE — Progress Notes (Signed)
 Lab contacted regarding calcium  levels. Per lab technician Calcium  levels were cancelled. Hospitalist provider contacted, see new order.

## 2024-05-30 NOTE — Plan of Care (Signed)

## 2024-05-31 ENCOUNTER — Encounter: Payer: Self-pay | Admitting: Hematology

## 2024-05-31 ENCOUNTER — Other Ambulatory Visit (HOSPITAL_COMMUNITY): Payer: Self-pay

## 2024-05-31 LAB — RENAL FUNCTION PANEL
Albumin: 3.2 g/dL — ABNORMAL LOW (ref 3.5–5.0)
Anion gap: 10 (ref 5–15)
BUN: 23 mg/dL (ref 8–23)
CO2: 29 mmol/L (ref 22–32)
Calcium: 9.1 mg/dL (ref 8.9–10.3)
Chloride: 100 mmol/L (ref 98–111)
Creatinine, Ser: 1.13 mg/dL — ABNORMAL HIGH (ref 0.44–1.00)
GFR, Estimated: 50 mL/min — ABNORMAL LOW
Glucose, Bld: 123 mg/dL — ABNORMAL HIGH (ref 70–99)
Phosphorus: 4.3 mg/dL (ref 2.5–4.6)
Potassium: 4 mmol/L (ref 3.5–5.1)
Sodium: 138 mmol/L (ref 135–145)

## 2024-05-31 LAB — CALCIUM: Calcium: 8.9 mg/dL (ref 8.9–10.3)

## 2024-05-31 MED ORDER — LIDOCAINE 4 % EX PTCH
1.0000 | MEDICATED_PATCH | CUTANEOUS | 0 refills | Status: AC
  Filled 2024-05-31: qty 30, 30d supply, fill #0

## 2024-05-31 MED ORDER — CALCIUM CARBONATE 1250 (500 CA) MG PO TABS
4.0000 | ORAL_TABLET | Freq: Three times a day (TID) | ORAL | 0 refills | Status: AC
  Filled 2024-05-31: qty 360, 30d supply, fill #0

## 2024-05-31 MED ORDER — TRAMADOL HCL 50 MG PO TABS
50.0000 mg | ORAL_TABLET | Freq: Four times a day (QID) | ORAL | 0 refills | Status: AC | PRN
  Filled 2024-05-31: qty 12, 3d supply, fill #0

## 2024-05-31 MED ORDER — MAGNESIUM OXIDE -MG SUPPLEMENT 400 (240 MG) MG PO TABS
400.0000 mg | ORAL_TABLET | Freq: Every day | ORAL | 0 refills | Status: AC
  Filled 2024-05-31: qty 30, 30d supply, fill #0

## 2024-05-31 MED ORDER — CALCITRIOL 0.5 MCG PO CAPS
0.5000 ug | ORAL_CAPSULE | Freq: Two times a day (BID) | ORAL | 2 refills | Status: AC
  Filled 2024-05-31: qty 60, 30d supply, fill #0

## 2024-05-31 NOTE — TOC Transition Note (Signed)
 Transition of Care [facility]) - Discharge Note   Patient Details  Name: Lynn Jenkins MRN: [id number] Date of Birth: 23-Apr-1946  Transition of Care [facility]) CM/SW Contact:  Heather DELENA Saltness, LCSW Phone Number: 05/31/2024, 10:06 AM   Clinical Narrative:    Pt discharging home today. OPPT referral sent to [facility]. Rollator delivered to bedside by [employer]. Pt's friend to transport pt home upon discharge. No further TOC needs at this time.   Final next level of care: Home/Self Care Barriers to Discharge: Barriers Resolved   Patient Goals and CMS Choice Patient states their goals for this hospitalization and ongoing recovery are:: To return home        Discharge Placement  Home              Patient to be transferred to facility by: Friend Name of family member notified: Patient Patient and family notified of of transfer: 05/31/24  Discharge Plan and Services Additional resources added to the After Visit Summary for  Follow Up In-house Referral: Clinical Social Work Discharge Planning Services: NA Post Acute Care Choice: NA          DME Arranged: Vannie rolling with seat DME Agency: [employer] Date DME Agency Contacted: 05/25/24 Time DME Agency Contacted: 1344 Representative spoke with at DME Agency: [place] HH Arranged: NA HH Agency: NA        Social Drivers of Health (SDOH) Interventions SDOH Screenings   Food Insecurity: No Food Insecurity (05/23/2024)  Housing: Low Risk (05/23/2024)  Transportation Needs: No Transportation Needs (05/23/2024)  Utilities: Not At Risk (05/23/2024)  Social Connections: Unknown (05/23/2024)  Tobacco Use: Low Risk (05/23/2024)     Readmission Risk Interventions     No data to display           Signed: Heather Saltness, MSW, LCSW Clinical Social Worker Inpatient Care Management 05/31/2024 10:07 AM

## 2024-05-31 NOTE — Discharge Summary (Signed)
 Physician Discharge Summary  Lynn Jenkins FMW:[id number] DOB: 1945/12/16 DOA: 05/23/2024  PCP: Okey Carlin Redbird, MD  Admit date: 05/23/2024 Discharge date: 05/31/2024  Admitted From: Home Disposition: Home with home health  Recommendations for Outpatient Follow-up:  Follow up with PCP in 1-2 weeks Please obtain BMP/CBC in one week   Home Health: Outpatient PT Equipment/Devices: None  Discharge Condition: Stable CODE STATUS: Full code Diet recommendation: Low-salt diet  Discharge summary: 78 year old with history of generalized anxiety disorder, GERD, hypothyroidism, hyperlipidemia, vitamin D  deficiency, primary hyperparathyroidism status post total parathyroidectomy 6 days prior to arrival presented to the emergency room with generalized weakness, shortness of breath, diarrhea and extremity numbness sensation.  According to the patient, she had persistent vomiting and difficulty tolerating diet after surgery.  Calcium  was 11 on discharge. In the emergency room afebrile.  On room air.  Blood pressure adequate.  Calcium  was 7.5, magnesium  was 1.6.  Patient was admitted due to significant symptoms.  Was given calcium  gluconate.  Patient remained in the hospital due to significant symptomatic hypocalcemia and needed injectable calcium  supplementation while oral calcium  was taking effect.  Also seen by nephrology.  Ultimately stabilized.  Going home with oral supplementation.  Will need close outpatient monitoring.  Symptomatic hypocalcemia after total parathyroidectomy: Was persistently symptomatic.  Needed multiple doses of IV calcium  due to symptoms.   Latest calcium  more than 9 x 2.   Remains on high-dose oral replacement. Scheduled magnesium . Nephrology was consulting. Patient has not required IV calcium  for the last 48 hours.  Symptoms improved. Going home with high-dose oral calcium , calcitriol  and magnesium .  Will need to recheck in 1 week.  If adequate, can likely gradually  come down on the dose of calcium .   Hypokalemia/hypomagnesemia: Replaced.  Will keep on scheduled magnesium .  Will prescribe on discharge.  Musculoskeletal pain: Patient had previous fall and still has some back pain.  She can use lidocaine  patch, was also prescribed a short course of tramadol  to use as needed.   Chronic medical issues including Hypothyroidism, on Synthroid  continue Hyperlipidemia, on a statin continue CKD stage IIIa, is stable.  Continue. Anxiety, on sertraline  continue.  Can use trazodone  for sleep.  Stabilized to discharge home.  Discharge Diagnoses:  Principal Problem:   Hypocalcemia Active Problems:   Hyperparathyroidism, primary   Obesity (BMI 30-39.9)   Hypothyroidism   Primary open angle glaucoma (POAG) of left eye, severe stage   Mixed hyperlipidemia   Gastroesophageal reflux disease   Anxiety   Stage 3 chronic kidney disease (HCC)   Vitamin D  deficiency   Hypokalemia   Hypomagnesemia   Abnormal LFTs   Hypoglycemia    Discharge Instructions  Discharge Instructions     Ambulatory referral to Physical Therapy   Complete by: As directed    Diet - low sodium heart healthy   Complete by: As directed    Discharge instructions   Complete by: As directed    Needs recheck Renal function test in 1 week   Increase activity slowly   Complete by: As directed    No wound care   Complete by: As directed       Allergies as of 05/31/2024       Reactions   Doxycycline Itching, Swelling, Other (See Comments)   Facial swelling, itching   Morphine And Codeine Swelling   Zomig [zolmitriptan] Nausea And Vomiting   Increased heart rate    Augmentin  [amoxicillin -pot Clavulanate] Diarrhea   Ceftin [cefuroxime Axetil] Other (See Comments)  Stomach ache   Gabapentin Other (See Comments)   Confusion, off balance   Ferrous Sulfate  Diarrhea   Methazolamide Diarrhea   Biaxin [clarithromycin] Rash   Cefdinir Rash   Sumatriptan  Other (See Comments)   Can  use brand Imitrex         Medication List     STOP taking these medications    ciclopirox  8 % solution Commonly known as: PENLAC    ondansetron  4 MG disintegrating tablet Commonly known as: ZOFRAN -ODT       TAKE these medications    acetaminophen  500 MG tablet Commonly known as: TYLENOL  Take 500-1,000 mg by mouth every 6 (six) hours as needed for mild pain or moderate pain.   calcitRIOL  0.5 MCG capsule Commonly known as: ROCALTROL  Take 1 capsule (0.5 mcg total) by mouth 2 (two) times daily.   calcium  carbonate 1250 (500 Ca) MG tablet Commonly known as: OS-CAL - dosed in mg of elemental calcium  Take 4 tablets (5,000 mg total) by mouth 3 (three) times daily between meals.   furosemide  20 MG tablet Commonly known as: LASIX  Take 20 mg by mouth daily as needed for fluid or edema.   levothyroxine  88 MCG tablet Commonly known as: SYNTHROID  Take 88 mcg by mouth daily before breakfast.   lidocaine  4 % Place 1 patch onto the skin daily.   loperamide  2 MG tablet Commonly known as: IMODIUM  A-D Take 2 mg by mouth 4 (four) times daily as needed for diarrhea or loose stools.   magnesium  oxide 400 (240 Mg) MG tablet Commonly known as: MAG-OX Take 1 tablet (400 mg total) by mouth daily.   metoCLOPramide  10 MG tablet Commonly known as: REGLAN  Take 1 tablet (10 mg total) by mouth every 6 (six) hours as needed for nausea or vomiting. What changed:  how much to take when to take this additional instructions   multivitamin with minerals Tabs tablet Take 1 tablet by mouth daily.   nitroGLYCERIN  0.4 MG SL tablet Commonly known as: NITROSTAT  Place 1 tablet (0.4 mg total) under the tongue every 5 (five) minutes as needed for chest pain. Max 3 tablets per event.   Refresh Plus 0.5 % Soln Generic drug: Carboxymethylcellulose Sod PF Place 1 drop into both eyes 2 (two) times daily.   rosuvastatin  20 MG tablet Commonly known as: CRESTOR  Take 1 tablet (20 mg total) by mouth  daily.   sertraline  100 MG tablet Commonly known as: ZOLOFT  Take 100 mg by mouth daily.   timolol  0.5 % ophthalmic solution Commonly known as: TIMOPTIC  Place 1 drop into the right eye 2 (two) times daily.   traMADol  50 MG tablet Commonly known as: ULTRAM  Take 1 tablet (50 mg total) by mouth every 6 (six) hours as needed for up to 3 days for moderate pain (pain score 4-6) or severe pain (pain score 7-10).   Xalatan  0.005 % ophthalmic solution Generic drug: latanoprost  Place 1 drop into the right eye at bedtime.        Allergies[1]  Consultations: Nephrology General Surgery   Procedures/Studies: No results found. (Echo, Carotid, EGD, Colonoscopy, ERCP)    Subjective: Patient seen and examined.  Denies any complaints.  She feels comfortable with plan to go home.   Discharge Exam: Vitals:   05/30/24 2032 05/31/24 0506  BP: (!) 145/71 115/78  Pulse: 61 63  Resp: 18 20  Temp: 99.5 F (37.5 C) 99 F (37.2 C)  SpO2: 100% 94%   Vitals:   05/30/24 0737 05/30/24 1445 05/30/24 2032 05/31/24  0506  BP:  131/82 (!) 145/71 115/78  Pulse:  69 61 63  Resp:  20 18 20   Temp:  99 F (37.2 C) 99.5 F (37.5 C) 99 F (37.2 C)  TempSrc:  Oral Oral Oral  SpO2:  99% 100% 94%  Weight: 86.5 kg   86.2 kg  Height:        General: Pt is alert, awake, not in acute distress Patient has some ecchymosis around the neck incision line.  Otherwise looks uncomplicated. Cardiovascular: RRR, S1/S2 +, no rubs, no gallops Respiratory: CTA bilaterally, no wheezing, no rhonchi Abdominal: Soft, NT, ND, bowel sounds + Extremities: no edema, no cyanosis    The results of significant diagnostics from this hospitalization (including imaging, microbiology, ancillary and laboratory) are listed below for reference.     Microbiology: No results found for this or any previous visit (from the past 240 hours).   Labs: BNP (last 3 results) No results for input(s): BNP in the last 8760  hours. Basic Metabolic Panel: Recent Labs  Lab 05/25/24 0536 05/25/24 1712 05/28/24 0840 05/28/24 1547 05/29/24 0544 05/29/24 1802 05/30/24 0540 05/30/24 1854 05/31/24 0503 05/31/24 0504  NA 143  --  142  --  141  --  142  --  138  --   K 3.6  --  4.1  --  3.6  --  4.0  --  4.0  --   CL 106  --  104  --  103  --  104  --  100  --   CO2 25  --  22  --  26  --  27  --  29  --   GLUCOSE 110*  --  120*  --  132*  --  117*  --  123*  --   BUN 21  --  17  --  16  --  20  --  23  --   CREATININE 1.14*  --  0.90  --  0.96  --  0.97  --  1.13*  --   CALCIUM  7.2*   < > 8.3*   < > 8.2* 9.9 9.9 9.6 9.1 8.9  MG 1.7  --   --   --   --   --   --   --   --   --   PHOS 4.2  --  5.4*  --  4.2  --  5.1*  --  4.3  --    < > = values in this interval not displayed.   Liver Function Tests: Recent Labs  Lab 05/25/24 0536 05/28/24 0840 05/29/24 0544 05/30/24 0540 05/31/24 0503  AST 36  --   --   --   --   ALT 32  --   --   --   --   ALKPHOS 212*  --   --   --   --   BILITOT 0.5  --   --   --   --   PROT 7.0  --   --   --   --   ALBUMIN 3.4* 3.4* 3.4* 3.5 3.2*   No results for input(s): LIPASE, AMYLASE in the last 168 hours. No results for input(s): AMMONIA in the last 168 hours. CBC: Recent Labs  Lab 05/25/24 0536  WBC 7.8  NEUTROABS 5.4  HGB 11.8*  HCT 37.0  MCV 90.5  PLT 186   Cardiac Enzymes: No results for input(s): CKTOTAL, CKMB, CKMBINDEX, TROPONINI in the last 168 hours.  BNP: Invalid input(s): POCBNP CBG: No results for input(s): GLUCAP in the last 168 hours. D-Dimer No results for input(s): DDIMER in the last 72 hours. Hgb A1c No results for input(s): HGBA1C in the last 72 hours. Lipid Profile No results for input(s): CHOL, HDL, LDLCALC, TRIG, CHOLHDL, LDLDIRECT in the last 72 hours. Thyroid  function studies No results for input(s): TSH, T4TOTAL, T3FREE, THYROIDAB in the last 72 hours.  Invalid input(s): FREET3 Anemia  work up No results for input(s): VITAMINB12, FOLATE, FERRITIN, TIBC, IRON, RETICCTPCT in the last 72 hours. Urinalysis    Component Value Date/Time   COLORURINE YELLOW 05/23/2024 1133   APPEARANCEUR HAZY (A) 05/23/2024 1133   LABSPEC 1.009 05/23/2024 1133   PHURINE 6.0 05/23/2024 1133   GLUCOSEU NEGATIVE 05/23/2024 1133   HGBUR SMALL (A) 05/23/2024 1133   BILIRUBINUR NEGATIVE 05/23/2024 1133   KETONESUR NEGATIVE 05/23/2024 1133   PROTEINUR 30 (A) 05/23/2024 1133   NITRITE NEGATIVE 05/23/2024 1133   LEUKOCYTESUR NEGATIVE 05/23/2024 1133   Sepsis Labs Recent Labs  Lab 05/25/24 0536  WBC 7.8   Microbiology No results found for this or any previous visit (from the past 240 hours).   Time coordinating discharge: 35 minutes  SIGNED:   Renato Applebaum, MD  [facility] 05/31/2024, 9:07 AM     [1]  Allergies Allergen Reactions   Doxycycline Itching, Swelling and Other (See Comments)    Facial swelling, itching   Morphine And Codeine Swelling   Zomig [Zolmitriptan] Nausea And Vomiting    Increased heart rate    Augmentin  [Amoxicillin -Pot Clavulanate] Diarrhea   Ceftin [Cefuroxime Axetil] Other (See Comments)    Stomach ache   Gabapentin Other (See Comments)    Confusion, off balance   Ferrous Sulfate  Diarrhea   Methazolamide Diarrhea   Biaxin [Clarithromycin] Rash   Cefdinir Rash   Sumatriptan  Other (See Comments)    Can use brand Imitrex 

## 2024-05-31 NOTE — Plan of Care (Signed)

## 2024-05-31 NOTE — Progress Notes (Signed)
 Occupational Therapy Treatment Patient Details Name: Lynn Jenkins MRN: [id number] DOB: 01-19-1946 Today's Date: 05/31/2024   History of present illness Lynn Jenkins is a 78 y.o. female who presented to [facility] ED with generalized weakness, dyspnea, diarrhea, facial, hand and lower extremity numbness sensation. Dx with  Hyperparathyroidism, Hypocalcemia,Hypokalemia, Hypomagnesemia  PMH: normocytic anemia, osteoarthritis, CKD, general anxiety disorder, GERD, hiatal hernia, glaucoma, migraine headache, hypothyroidism, unspecified lupus, mixed hyperlipidemia, multinodular goiter, osteopenia, history of pneumonia, varicose vein, vitamin D  deficiency, primary   OT comments  Patient seen for OT session this am. Patient on commode and completing peri hygiene upon OT arrival and  readying for discharge. OT reinforced 5 P's and use of DME for seated showers as well as rollator safety and pacing as well as light UE HEP and breathing exercise carryover. Will benefit from OPOT services upon discharge and caregiver support of neighbor as patient declined rec for rehab. Patient requires continued Acute care hospital level OT services to progress safety and functional performance and allow for discharge.        If plan is discharge home, recommend the following:  A little help with bathing/dressing/bathroom;Assistance with cooking/housework;Assist for transportation;Help with stairs or ramp for entrance;A little help with walking and/or transfers   Equipment Recommendations  BSC/3in1       Precautions / Restrictions Precautions Precautions: None Precaution/Restrictions Comments: recent Para Throid removal Restrictions Weight Bearing Restrictions Per Provider Order: No       Mobility Bed Mobility Overal bed mobility: Modified Independent Bed Mobility: Supine to Sit     Supine to sit: Supervision, HOB elevated, Used rails     General bed mobility comments: no cues needed    Transfers Overall  transfer level: Needs assistance Equipment used: Rolling walker (2 wheels) Transfers: Sit to/from Stand Sit to Stand: Contact guard assist     Step pivot transfers: Contact guard assist     General transfer comment: Able to complete sit to stand transfer CGA. After ambulation patient when to the bathroom and was able to complete hygiene independently.     Balance Overall balance assessment: Needs assistance Sitting-balance support: Feet supported Sitting balance-Leahy Scale: Good     Standing balance support: No upper extremity supported Standing balance-Leahy Scale: Good Standing balance comment: Patient able to stand with FWW infront to complete hygiene activities independently. No instability noted.                           ADL either performed or assessed with clinical judgement   ADL Overall ADL's : Modified independent                                       General ADL Comments: readying for discharge, OT reinforced 5 P's and use of DME for seated showers as well as rollator safety and pacing    Extremity/Trunk Assessment Upper Extremity Assessment Upper Extremity Assessment: Generalized weakness;Right hand dominant   Lower Extremity Assessment Lower Extremity Assessment: Defer to PT evaluation        Vision   Vision Assessment?: Wears glasses for reading;Wears glasses for driving         Communication Communication Communication: No apparent difficulties   Cognition Arousal: Alert Behavior During Therapy: WFL for tasks assessed/performed Cognition: No apparent impairments             OT - Cognition Comments: mild slower  processing but overall WFL's                 Following commands: Intact        Cueing   Cueing Techniques: Verbal cues  Exercises Exercises: Other exercises (L1 tband for triceps press 10 reps Bly and pursed lip breathing)       General Comments educated on pacing for progression of activity  tolerance and handout issued for discharge carryover    Pertinent Vitals/ Pain       Pain Assessment Pain Assessment: Faces Faces Pain Scale: Hurts a little bit Breathing: normal Negative Vocalization: none Pain Location: back, neck and L knee Pain Descriptors / Indicators: Aching, Guarding Pain Intervention(s): Monitored during session, Premedicated before session, Repositioned, Relaxation   Frequency  Min 2X/week        Progress Toward Goals  OT Goals(current goals can now be found in the care plan section)  Progress towards OT goals: Progressing toward goals      AM-PAC OT 6 Clicks Daily Activity     Outcome Measure   Help from another person eating meals?: None Help from another person taking care of personal grooming?: None Help from another person toileting, which includes using toliet, bedpan, or urinal?: None Help from another person bathing (including washing, rinsing, drying)?: None Help from another person to put on and taking off regular upper body clothing?: None Help from another person to put on and taking off regular lower body clothing?: None 6 Click Score: 24    End of Session Equipment Utilized During Treatment: Gait belt;Rolling walker (2 wheels)  OT Visit Diagnosis: Unsteadiness on feet (R26.81);History of falling (Z91.81);Pain   Activity Tolerance Patient limited by fatigue   Patient Left in chair;with call bell/phone within reach;with chair alarm set   Nurse Communication Mobility status        Time: 1010-1030 OT Time Calculation (min): 20 min  Charges: OT General Charges $OT Visit: 1 Visit OT Treatments $Self Care/Home Management : 8-22 mins  Leann Mayweather OT/L Acute Rehabilitation Department  [phone]  05/31/2024, 10:41 AM

## 2024-05-31 NOTE — Progress Notes (Signed)
 Discharge medications delivered to patient at the bedside.

## 2024-06-08 ENCOUNTER — Encounter: Payer: Self-pay | Admitting: Hematology

## 2024-06-11 NOTE — Progress Notes (Signed)
" ° °  PROVIDER:  KRYSTAL OZELL SPINNER, MD  MRN: [id number] DOB: 1946-02-10 DATE OF ENCOUNTER: 06/11/2024 Interval History:   Patient returns for postoperative visit having undergone neck exploration and parathyroidectomy on May 17, 2024.  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  At the time of surgery she was found to have a mass in the inferior portion of the right thyroid  lobe.  She underwent resection.  Final pathology demonstrated fragments of parathyroid  tissue with focal hypercellularity.  There was also benign thyroid  gland tissue.  Postoperatively the patient was hospitalized with hypocalcemia.  She was treated on the medical service.  She is currently at home.  She is taking Os-Cal 4 tablets 3 times a day, calcitriol  twice daily, and her usual medications.  Laboratory studies performed 2 days ago, June 09, 2024, demonstrated normal calcium  of 9.3 and a low intact PTH level of less than 6.  Currently the patient is asymptomatic.  Physical Examination:   Anterior cervical incision is healing nicely.  Minimal soft tissue swelling.  Dermabond remains in place.  There is some ecchymosis on the left side of the neck.   Assessment and Plan:    Diagnoses and all orders for this visit:  Status post parathyroidectomy    Patient returns for postoperative visit having undergone neck exploration and resection of a right inferior mass demonstrating hypercellular parathyroid  tissue and benign thyroid  tissue.  Postoperatively the patient developed significant hypoparathyroidism.  Laboratory studies have now improved with a calcium  level of 9.3.  PTH level remains quite low.  We will decrease her calcium  supplementation with Os-Cal to 3 tablets 3 times a day.  Will continue to take calcitriol  twice daily.  Patient is scheduled to see her primary care physician, Dr. Dale Gull, on January 13.  She is awaiting  a new appointment to see Dr. Chyrl Alexander in follow-up.  We will make a follow-up appointment in this office for 4 weeks.  We will plan to repeat her calcium  level and her PTH level prior to that office visit.  Patient will use Vaseline to remove the remaining Dermabond from her incision.  She will then begin applying topical creams to her incision as instructed below.  CARE OF SURGICAL INCISION   If there is still some Dermabond on your incision, use Vaseline to remove completely.  Apply Palmer's Cocoa Butter with Vitamin E cream to your incision 2 or 3 times daily.  Massage cream into incision for one minute with each application using small circular motions.  Use sunscreen (50 SPF or higher) for the first 6 months after surgery if area is exposed to sun.  You may alternate Mederma or other scar reducing cream with the cocoa butter cream if desired.   Krystal Spinner, MD [facility] Surgery Office: [phone]  "

## 2024-06-24 ENCOUNTER — Ambulatory Visit: Payer: Medicare (Managed Care) | Attending: Family Medicine | Admitting: Physical Therapy

## 2024-06-30 ENCOUNTER — Encounter: Payer: Self-pay | Admitting: Hematology

## 2024-07-01 ENCOUNTER — Encounter: Payer: Self-pay | Admitting: Physical Therapy

## 2024-07-01 ENCOUNTER — Ambulatory Visit: Payer: Medicare (Managed Care) | Attending: Family Medicine | Admitting: Physical Therapy

## 2024-07-01 DIAGNOSIS — M25561 Pain in right knee: Secondary | ICD-10-CM | POA: Diagnosis present

## 2024-07-01 DIAGNOSIS — R262 Difficulty in walking, not elsewhere classified: Secondary | ICD-10-CM | POA: Diagnosis present

## 2024-07-01 DIAGNOSIS — M6281 Muscle weakness (generalized): Secondary | ICD-10-CM | POA: Diagnosis present

## 2024-07-01 DIAGNOSIS — M25562 Pain in left knee: Secondary | ICD-10-CM | POA: Insufficient documentation

## 2024-07-01 NOTE — Therapy (Signed)
 " OUTPATIENT PHYSICAL THERAPY LOWER EXTREMITY EVALUATION   Patient Name: Lynn Jenkins MRN: [id number] DOB:[birth year], 79 y.o., female Today's Date: 07/01/2024  END OF SESSION:  PT End of Session - 07/01/24 1538     Visit Number 1    Date for Recertification  09/29/24    Authorization Type [employer]    PT Start Time 1530    PT Stop Time 1615    PT Time Calculation (min) 45 min    Activity Tolerance Patient tolerated treatment well    Behavior During Therapy WFL for tasks assessed/performed          Past Medical History:  Diagnosis Date   Anemia    Arthritis    Chronic kidney disease    Generalized anxiety disorder    GERD (gastroesophageal reflux disease)    Glaucoma    Headache    hx of migraines    History of hiatal hernia    Hyperthyroidism    Lupus    Migraine    Mixed hyperlipidemia    Multinodular goiter    Osteopenia    Pneumonia    Primary hyperparathyroidism    Varicose veins    Vitamin D  deficiency    Past Surgical History:  Procedure Laterality Date   ABDOMINAL HYSTERECTOMY     APPENDECTOMY     CHOLECYSTECTOMY N/A 03/11/2022   Procedure: LAPAROSCOPIC CHOLECYSTECTOMY WITH icg dye;  Surgeon: Signe Mitzie LABOR, MD;  Location: [facility] ORS;  Service: General;  Laterality: N/A;   COLONOSCOPY N/A 04/06/2024   Procedure: COLONOSCOPY;  Surgeon: Dianna Specking, MD;  Location: [facility];  Service: Gastroenterology;  Laterality: N/A;   EYE SURGERY     FRACTURE SURGERY Right 2023   knee   INSERTION OF MESH  10/15/2022   Procedure: INSERTION OF MESH;  Surgeon: Leos Calamity, MD;  Location: [facility] OR;  Service: General;;   left foot surgery      PARATHYROIDECTOMY N/A 11/01/2014   Procedure: NECK EXPLORATION AND PARATHYROIDECTOMY;  Surgeon: Krystal Spinner, MD;  Location: [facility];  Service: General;  Laterality: N/A;   PARATHYROIDECTOMY N/A 10/31/2020   Procedure: PARATHYROIDECTOMY WITH LEFT THYROID  LOBECTOMY;  Surgeon: Spinner Krystal, MD;  Location: [facility];   Service: General;  Laterality: N/A;   PARATHYROIDECTOMY N/A 05/17/2024   Procedure: PARATHYROIDECTOMY;  Surgeon: Spinner Krystal, MD;  Location: [facility];  Service: General;  Laterality: N/A;  NECK EXPLORATION WITH PARATHYROIDECTOMY   SHOULDER SURGERY     XI ROBOTIC ASSISTED HIATAL HERNIA REPAIR N/A 10/15/2022   Procedure: XI ROBOTIC ASSISTED HIATAL HERNIA REPAIR WITH FUNDOPLICATION;  Surgeon: Leos Calamity, MD;  Location: [facility] OR;  Service: General;  Laterality: N/A;   Patient Active Problem List   Diagnosis Date Noted   Hypoglycemia 05/25/2024   Coffee ground emesis 05/23/2024   Gastroesophageal reflux disease 05/23/2024   Gastroparesis 05/23/2024   Anxiety 05/23/2024   Candidiasis 05/23/2024   Edema 05/23/2024   Hardening of the aorta (main artery of the heart) 05/23/2024   Multinodular goiter 05/23/2024   Morbid obesity (HCC) 05/23/2024   Impairment of balance 05/23/2024   Osteoarthritis of knee 05/23/2024   Osteopenia 05/23/2024   Stage 3 chronic kidney disease (HCC) 05/23/2024   Stasis dermatitis 05/23/2024   Vitamin D  deficiency 05/23/2024   Yeast infection 05/23/2024   Hypocalcemia 05/23/2024   Hypokalemia 05/23/2024   Hypomagnesemia 05/23/2024   Abnormal LFTs 05/23/2024   Primary hyperparathyroidism 05/17/2024   Pre-op evaluation 04/07/2024   DOE (dyspnea on exertion) 12/31/2023   Chest tightness  12/31/2023   Nonintractable headache 12/31/2023   Mixed hyperlipidemia 12/31/2023   Daytime sleepiness 12/31/2023   Polyp of colon 09/03/2023   S/P Nissen fundoplication (without gastrostomy tube) procedure 10/15/2022   Intractable nausea and vomiting 08/15/2022   Intractable vomiting 08/14/2022   Iron deficiency anemia 03/12/2022   Obesity (BMI 30-39.9) 03/10/2022   Hypothyroidism 03/10/2022   Anemia, unspecified 03/10/2022   Migraine headache without aura 03/10/2022   Acute cholecystitis 03/09/2022   Glaucoma 04/02/2021   Disorder of bone 04/02/2021   Fibromyalgia  04/02/2021   Hypercalcemia 04/02/2021   Inflammatory arthritis 04/02/2021   History of lobectomy of thyroid  01/03/2021   Pain of left hand 09/17/2019   Leukocytosis 08/18/2019   Microcytic anemia 08/18/2019   Uveitis of left eye 06/23/2019   CME (cystoid macular edema), left 04/23/2019   Cornea guttata 04/23/2019   Combined form of age-related cataract, right eye 06/24/2018   Primary open angle glaucoma (POAG) of left eye, severe stage 06/24/2018   Hyperparathyroidism, primary 10/31/2014   Varicose veins of bilateral lower extremities with other complications 11/12/2011    PCP: Okey, MD  REFERRING PROVIDER: SISTO Okey, MD  REFERRING DIAG: weakness  THERAPY DIAG:  Acute pain of both knees  Difficulty in walking, not elsewhere classified  Muscle weakness (generalized)  Rationale for Evaluation and Treatment: Rehabilitation  ONSET DATE: 05/23/24  SUBJECTIVE:   SUBJECTIVE STATEMENT: Had an 8 day hospital stay due to calcium  issue.  She reports that she is very weak, difficulty walking and knee pain. 79 year old with history of generalized anxiety disorder, GERD, hypothyroidism, hyperlipidemia, vitamin D  deficiency, primary hyperparathyroidism status post total parathyroidectomy 6 days prior to arrival presented to the emergency room with generalized weakness, shortness of breath, diarrhea and extremity numbness sensation.  According to the patient, she had persistent vomiting and difficulty tolerating diet after surgery.   PERTINENT HISTORY: See above, arthroscopic surgery knees bilaterally PAIN:  Are you having pain? Yes: NPRS scale: 2/10 Pain location: left knee and left hip and leg Pain description: tender, hurts Aggravating factors: walking, movements up to 8/10 Relieving factors: nothing helps, at best a 2/10, tried to lie down, tried OTC pain medication  PRECAUTIONS: None  RED FLAGS: None   WEIGHT BEARING RESTRICTIONS: none  FALLS:  Has patient fallen in last 6  months? Yes. 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  DIAGNOSTIC FINDINGS: none  COGNITION: Overall cognitive status: Within functional limits for tasks assessed     SENSATION: WFL  MUSCLE LENGTH: Very tight and stiff in the LE's, 40 degrees SLR, gentle movements cause some cramping in the HS and adductors  POSTURE: rounded shoulders and forward head  PALPATION: Very tender in the hips and buttocks L> R, tender in the ITB  LOWER EXTREMITY ROM:  Active ROM Right eval Left eval  Hip flexion    Hip extension    Hip abduction    Hip adduction    Hip internal rotation    Hip external rotation  Knee flexion 110 100  Knee extension 0 0  Ankle dorsiflexion    Ankle plantarflexion    Ankle inversion    Ankle eversion     (Blank rows = not tested)  LOWER EXTREMITY MMT:  MMT Right eval Left eval  Hip flexion 4 4-  Hip extension    Hip abduction    Hip adduction 4 4  Hip internal rotation    Hip external rotation    Knee flexion 4 4  Knee extension 4- 4-  Ankle dorsiflexion    Ankle plantarflexion    Ankle inversion    Ankle eversion     (Blank rows = not tested)  LOWER EXTREMITY SPECIAL TESTS:  Hip special tests: Trendelenburg test: positive   FUNCTIONAL TESTS:  Timed up and go (TUG): 19 seconds unsteady at first 3 minute walk test: 200 feet had to stop due pain in the thighs and SOB  GAIT: Distance walked: 200 feet Assistive device  utilized: None Level of assistance: SBA Comments: significant wobble, trendelenberg on the left                                                                                                                                TREATMENT DATE:   07/01/24 Evaluation   PATIENT EDUCATION:  Education details: POC/HEP Person educated: Patient Education method: Explanation, Facilities Manager, and Handouts Education comprehension: verbalized understanding  HOME EXERCISE PROGRAM: Access Code: [email or web address] URL: [email or web address] Date: 07/01/2024 Prepared by: Ozell Mainland  Exercises - Supine Bridge  - 2 x daily - 7 x weekly - 2 sets - 10 reps - 3 hold - Supine Lower Trunk Rotation  - 2 x daily - 7 x weekly - 2 sets - 10 reps - 3 hold - Standing March with Counter Support  - 2 x daily - 7 x weekly - 2 sets - 10 reps - 3 hold - Standing Hip Abduction with Counter Support  - 2 x daily - 7 x weekly - 2 sets - 10 reps - 3 hold  ASSESSMENT:  CLINICAL IMPRESSION: Patient is a 79 y.o. female who was seen today for physical therapy evaluation and treatment for weakness, difficulty walking and LE pain.  She has a surgery on her thyroid  and then after had significant issues with low calcium  and required an 8 day hospital stay, she now reports very weak and difficulty walking.  She tested fairly well with strength of the LE's but has significant trendelenberg.   OBJECTIVE IMPAIRMENTS: Abnormal gait, cardiopulmonary status limiting activity, decreased activity tolerance, decreased balance, decreased coordination, decreased endurance, decreased mobility, difficulty walking, decreased ROM, decreased strength, decreased safety awareness, increased fascial restrictions, impaired perceived functional ability, increased muscle spasms, impaired flexibility, improper body mechanics, postural dysfunction, and pain.   REHAB POTENTIAL: Good  CLINICAL DECISION MAKING: Stable/uncomplicated  EVALUATION  COMPLEXITY: Low   GOALS: Goals reviewed with patient? Yes  SHORT TERM GOALS: Target date: 07/26/24 Independent with initial  HEP Baseline: Goal status: INITIAL  LONG TERM GOALS: Target date: 09/29/24  Independent with advanced HEP Baseline:  Goal status: INITIAL  2.  Decrease TUG time to 13 seconds Baseline:  Goal status: INITIAL  3.  Improve 3 minute walk test to 500 feet Baseline: 200 feet Goal status: INITIAL  4.  Report pain with walking to less than 4/10 Baseline: 8/10 Goal status: INITIAL  5.  Improve left hip strength to 4+/5 Baseline: 4-/5 Goal status: INITIAL  PLAN:  PT FREQUENCY: 1x/week  PT DURATION: 12 weeks  PLANNED INTERVENTIONS: 97164- PT Re-evaluation, 97110-Therapeutic exercises, 97530- Therapeutic activity, 97112- Neuromuscular re-education, 97535- Self Care, [id number]- Manual therapy, [phone]- Gait training, [phone]- Electrical stimulation (unattended), Patient/Family education, Balance training, Stair training, Joint mobilization, Cryotherapy, and Moist heat  PLAN FOR NEXT SESSION: due to copay patient wants to try 1x/week, needs strength and endurance, flexibility   Kazi Montoro W, PT 07/01/2024, 3:39 PM  "

## 2024-07-02 ENCOUNTER — Other Ambulatory Visit (HOSPITAL_COMMUNITY): Payer: Self-pay

## 2024-07-05 ENCOUNTER — Other Ambulatory Visit (HOSPITAL_COMMUNITY): Payer: Self-pay

## 2024-07-05 ENCOUNTER — Encounter: Payer: Self-pay | Admitting: Hematology

## 2024-07-12 ENCOUNTER — Ambulatory Visit: Payer: Medicare (Managed Care) | Admitting: Physical Therapy

## 2024-07-14 ENCOUNTER — Ambulatory Visit: Payer: Medicare (Managed Care) | Admitting: Physical Therapy

## 2024-07-14 ENCOUNTER — Encounter: Payer: Self-pay | Admitting: Hematology

## 2024-07-28 ENCOUNTER — Ambulatory Visit: Payer: Medicare (Managed Care) | Admitting: Physical Therapy
